# Patient Record
Sex: Male | Born: 1960 | Hispanic: No | Marital: Married | Smoking: Never smoker
Health system: Southern US, Community
[De-identification: ages and names within clinical notes are randomized; demographics above are authoritative.]

## PROBLEM LIST (undated history)

## (undated) DIAGNOSIS — I609 Nontraumatic subarachnoid hemorrhage, unspecified: Secondary | ICD-10-CM

## (undated) DIAGNOSIS — I639 Cerebral infarction, unspecified: Secondary | ICD-10-CM

## (undated) DIAGNOSIS — E119 Type 2 diabetes mellitus without complications: Secondary | ICD-10-CM

## (undated) DIAGNOSIS — J84112 Idiopathic pulmonary fibrosis: Secondary | ICD-10-CM

## (undated) DIAGNOSIS — J45909 Unspecified asthma, uncomplicated: Secondary | ICD-10-CM

## (undated) DIAGNOSIS — R519 Headache, unspecified: Secondary | ICD-10-CM

## (undated) DIAGNOSIS — R51 Headache: Secondary | ICD-10-CM

## (undated) HISTORY — PX: ANEURYSM COILING: SHX5349

## (undated) HISTORY — DX: Unspecified asthma, uncomplicated: J45.909

---

## 2012-10-08 ENCOUNTER — Emergency Department (HOSPITAL_COMMUNITY): Payer: Medicaid Other

## 2012-10-08 ENCOUNTER — Inpatient Hospital Stay (HOSPITAL_COMMUNITY)
Admission: EM | Admit: 2012-10-08 | Discharge: 2012-10-27 | DRG: 020 | Disposition: A | Discharge status: Rehabilitation Facility | Payer: Medicaid Other | Attending: Neurosurgery | Admitting: Neurosurgery

## 2012-10-08 ENCOUNTER — Encounter (HOSPITAL_COMMUNITY): Payer: Self-pay

## 2012-10-08 DIAGNOSIS — J9601 Acute respiratory failure with hypoxia: Secondary | ICD-10-CM

## 2012-10-08 DIAGNOSIS — J9611 Chronic respiratory failure with hypoxia: Secondary | ICD-10-CM

## 2012-10-08 DIAGNOSIS — K560 Paralytic ileus: Secondary | ICD-10-CM | POA: Diagnosis not present

## 2012-10-08 DIAGNOSIS — G911 Obstructive hydrocephalus: Secondary | ICD-10-CM | POA: Diagnosis present

## 2012-10-08 DIAGNOSIS — R4182 Altered mental status, unspecified: Secondary | ICD-10-CM | POA: Diagnosis present

## 2012-10-08 DIAGNOSIS — J9610 Chronic respiratory failure, unspecified whether with hypoxia or hypercapnia: Secondary | ICD-10-CM

## 2012-10-08 DIAGNOSIS — R7309 Other abnormal glucose: Secondary | ICD-10-CM | POA: Diagnosis not present

## 2012-10-08 DIAGNOSIS — I82619 Acute embolism and thrombosis of superficial veins of unspecified upper extremity: Secondary | ICD-10-CM | POA: Diagnosis not present

## 2012-10-08 DIAGNOSIS — R509 Fever, unspecified: Secondary | ICD-10-CM

## 2012-10-08 DIAGNOSIS — E871 Hypo-osmolality and hyponatremia: Secondary | ICD-10-CM | POA: Diagnosis not present

## 2012-10-08 DIAGNOSIS — I671 Cerebral aneurysm, nonruptured: Secondary | ICD-10-CM | POA: Diagnosis present

## 2012-10-08 DIAGNOSIS — E876 Hypokalemia: Secondary | ICD-10-CM | POA: Diagnosis not present

## 2012-10-08 DIAGNOSIS — J9600 Acute respiratory failure, unspecified whether with hypoxia or hypercapnia: Secondary | ICD-10-CM | POA: Diagnosis not present

## 2012-10-08 DIAGNOSIS — D696 Thrombocytopenia, unspecified: Secondary | ICD-10-CM | POA: Diagnosis not present

## 2012-10-08 DIAGNOSIS — I609 Nontraumatic subarachnoid hemorrhage, unspecified: Secondary | ICD-10-CM | POA: Diagnosis present

## 2012-10-08 HISTORY — DX: Headache, unspecified: R51.9

## 2012-10-08 HISTORY — DX: Headache: R51

## 2012-10-08 LAB — COMPREHENSIVE METABOLIC PANEL
ALT: 19 U/L (ref 0–53)
AST: 22 U/L (ref 0–37)
Albumin: 4 g/dL (ref 3.5–5.2)
Alkaline Phosphatase: 91 U/L (ref 39–117)
Calcium: 9.4 mg/dL (ref 8.4–10.5)
Glucose, Bld: 129 mg/dL — ABNORMAL HIGH (ref 70–99)
Potassium: 3.8 mEq/L (ref 3.5–5.1)
Sodium: 142 mEq/L (ref 135–145)
Total Protein: 8.1 g/dL (ref 6.0–8.3)

## 2012-10-08 LAB — PROTIME-INR
INR: 1.16 (ref 0.00–1.49)
Prothrombin Time: 14.6 seconds (ref 11.6–15.2)

## 2012-10-08 LAB — POCT I-STAT TROPONIN I

## 2012-10-08 LAB — POCT I-STAT, CHEM 8
BUN: 16 mg/dL (ref 6–23)
Creatinine, Ser: 1 mg/dL (ref 0.50–1.35)
Glucose, Bld: 125 mg/dL — ABNORMAL HIGH (ref 70–99)
Hemoglobin: 15.3 g/dL (ref 13.0–17.0)
Potassium: 3.8 mEq/L (ref 3.5–5.1)
Sodium: 144 mEq/L (ref 135–145)
TCO2: 23 mmol/L (ref 0–100)

## 2012-10-08 LAB — DIFFERENTIAL
Basophils Absolute: 0 10*3/uL (ref 0.0–0.1)
Eosinophils Absolute: 0 10*3/uL (ref 0.0–0.7)
Eosinophils Relative: 0 % (ref 0–5)
Lymphocytes Relative: 17 % (ref 12–46)
Lymphs Abs: 1.7 10*3/uL (ref 0.7–4.0)
Neutrophils Relative %: 78 % — ABNORMAL HIGH (ref 43–77)

## 2012-10-08 LAB — CBC
MCH: 34.9 pg — ABNORMAL HIGH (ref 26.0–34.0)
MCV: 97 fL (ref 78.0–100.0)
Platelets: 152 10*3/uL (ref 150–400)
RBC: 4.39 MIL/uL (ref 4.22–5.81)
RDW: 13.1 % (ref 11.5–15.5)
WBC: 9.8 10*3/uL (ref 4.0–10.5)

## 2012-10-08 LAB — ETHANOL: Alcohol, Ethyl (B): <11 mg/dL (ref 0–11)

## 2012-10-08 MED ORDER — NALOXONE HCL 1 MG/ML IJ SOLN
1.0000 mg | Freq: Once | INTRAMUSCULAR | Status: AC
  Administered 2012-10-08: 1 mg via INTRAVENOUS

## 2012-10-08 MED ORDER — NALOXONE HCL 1 MG/ML IJ SOLN
INTRAMUSCULAR | Status: AC
  Filled 2012-10-08: qty 2

## 2012-10-08 MED ORDER — SODIUM CHLORIDE 0.9 % IV BOLUS (SEPSIS)
1000.0000 mL | Freq: Once | INTRAVENOUS | Status: AC
  Administered 2012-10-08: 1000 mL via INTRAVENOUS

## 2012-10-08 NOTE — Code Documentation (Addendum)
Patient complained of headache throughout day, went to urgent care earlier in the day and received medication for the headache and was sent home. Around 1630 patient's mental status changed and became increasingly confused. Patient's wife called EMS. Code stroke called at 2108 after arrival to [facility] ED. Patient arrived via [facility] at 2053, LKW, 1630, EDP exam at 2102, stroke team arrived at 2120, neurologist arrived at 2120, patient arrived in CT at 2110, phlebotomist arrived at 2106, CT read by Dr. Amada Jupiter at 2125. Initial NIH 4, will continue to monitor.

## 2012-10-08 NOTE — ED Notes (Addendum)
PER EMS: pt from home, wife took him to urgent care earlier today around 1030 due to HA and vomiting. Was given a shot of something, and went home. Pts wife called EMS due to pts altered mental status. Upon ems arrival pt was projectile vomiting and systolic BP was 240s and not following commands.was given a bowl to vomit in and pt put it on his head. Given fluid per EMS and pts BP decreased to 140s. Pt still disoriented and not following commands, warm to the touch. Pt family denies any recent travel out of the country. Last known well was at 1630 today.

## 2012-10-08 NOTE — H&P (Signed)
Richard Davenport is an 52 y.o. male.   Chief Complaint: Subarachnoid hemorrhage HPI: The patient is a 52 year old gentleman who was in his usual state of health until this morning when he developed fairly acute onset of headache while he was eating his breakfast. He went to urgent care where he was evaluated and given some medications. He went home and went to the bathroom and then became unresponsive. [facility] where CT scan of the brain was obtained which showed diffuse subarachnoid hemorrhage with some intraventricular extension. Neurosurgical consultation was requested at that time. Situation was reviewed and was elected to admit the patient this time to the intensive care unit and obtain a cerebral arteriogram in the morning for treatment plans at that time.  Past Medical History  Diagnosis Date  . Headache     No past surgical history on file.  No family history on file. Social History:  has no tobacco, alcohol, and drug history on file.  Allergies: No Known Allergies   (Not in a hospital admission)  Results for orders placed during the hospital encounter of 10/08/12 (from the past 48 hour(s))  GLUCOSE, CAPILLARY     Status: Abnormal   Collection Time    10/08/12  9:12 PM      Result Value Range   Glucose-Capillary 109 (*) 70 - 99 mg/dL  ETHANOL     Status: None   Collection Time    10/08/12  9:31 PM      Result Value Range   Alcohol, Ethyl (B) <11  0 - 11 mg/dL   Comment:            LOWEST DETECTABLE LIMIT FOR     SERUM ALCOHOL IS 11 mg/dL     FOR MEDICAL PURPOSES ONLY  PROTIME-INR     Status: None   Collection Time    10/08/12  9:31 PM      Result Value Range   Prothrombin Time 14.6  11.6 - 15.2 seconds   INR 1.16  0.00 - 1.49  APTT     Status: None   Collection Time    10/08/12  9:31 PM      Result Value Range   aPTT 28  24 - 37 seconds  CBC     Status: Abnormal   Collection Time    10/08/12  9:31 PM      Result Value Range   WBC 9.8   4.0 - 10.5 K/uL   RBC 4.39  4.22 - 5.81 MIL/uL   Hemoglobin 15.3  13.0 - 17.0 g/dL   HCT 16.1  09.6 - 04.5 %   MCV 97.0  78.0 - 100.0 fL   MCH 34.9 (*) 26.0 - 34.0 pg   MCHC 35.9  30.0 - 36.0 g/dL   RDW 40.9  81.1 - 91.4 %   Platelets 152  150 - 400 K/uL  DIFFERENTIAL     Status: Abnormal   Collection Time    10/08/12  9:31 PM      Result Value Range   Neutrophils Relative % 78 (*) 43 - 77 %   Neutro Abs 7.6  1.7 - 7.7 K/uL   Lymphocytes Relative 17  12 - 46 %   Lymphs Abs 1.7  0.7 - 4.0 K/uL   Monocytes Relative 5  3 - 12 %   Monocytes Absolute 0.4  0.1 - 1.0 K/uL   Eosinophils Relative 0  0 - 5 %   Eosinophils Absolute  0.0  0.0 - 0.7 K/uL   Basophils Relative 0  0 - 1 %   Basophils Absolute 0.0  0.0 - 0.1 K/uL  COMPREHENSIVE METABOLIC PANEL     Status: Abnormal   Collection Time    10/08/12  9:31 PM      Result Value Range   Sodium 142  135 - 145 mEq/L   Potassium 3.8  3.5 - 5.1 mEq/L   Chloride 104  96 - 112 mEq/L   CO2 26  19 - 32 mEq/L   Glucose, Bld 129 (*) 70 - 99 mg/dL   BUN 16  6 - 23 mg/dL   Creatinine, Ser 1.61  0.50 - 1.35 mg/dL   Calcium 9.4  8.4 - 09.6 mg/dL   Total Protein 8.1  6.0 - 8.3 g/dL   Albumin 4.0  3.5 - 5.2 g/dL   AST 22  0 - 37 U/L   ALT 19  0 - 53 U/L   Alkaline Phosphatase 91  39 - 117 U/L   Total Bilirubin 0.5  0.3 - 1.2 mg/dL   GFR calc non Af Amer 82 (*) >90 mL/min   GFR calc Af Amer >90  >90 mL/min   Comment: (NOTE)     The eGFR has been calculated using the CKD EPI equation.     This calculation has not been validated in all clinical situations.     eGFR's persistently <90 mL/min signify possible Chronic Kidney     Disease.  TROPONIN I     Status: None   Collection Time    10/08/12  9:31 PM      Result Value Range   Troponin I <0.30  <0.30 ng/mL   Comment:            Due to the release kinetics of cTnI,     a negative result within the first hours     of the onset of symptoms does not rule out     myocardial infarction with  certainty.     If myocardial infarction is still suspected,     repeat the test at appropriate intervals.  POCT I-STAT TROPONIN I     Status: None   Collection Time    10/08/12  9:42 PM      Result Value Range   Troponin i, poc 0.01  0.00 - 0.08 ng/mL   Comment 3            Comment: Due to the release kinetics of cTnI,     a negative result within the first hours     of the onset of symptoms does not rule out     myocardial infarction with certainty.     If myocardial infarction is still suspected,     repeat the test at appropriate intervals.  POCT I-STAT, CHEM 8     Status: Abnormal   Collection Time    10/08/12  9:44 PM      Result Value Range   Sodium 144  135 - 145 mEq/L   Potassium 3.8  3.5 - 5.1 mEq/L   Chloride 107  96 - 112 mEq/L   BUN 16  6 - 23 mg/dL   Creatinine, Ser 0.45  0.50 - 1.35 mg/dL   Glucose, Bld 409 (*) 70 - 99 mg/dL   Calcium, Ion 8.11  1.12 - 1.23 mmol/L   TCO2 23  0 - 100 mmol/L   Hemoglobin 15.3  13.0 - 17.0 g/dL   HCT 45.0  39.0 - 52.0 %   Ct Head Wo Contrast  10/08/2012   *RADIOLOGY REPORT*  Clinical Data: Altered mental status, vomiting.  CT HEAD WITHOUT CONTRAST  Technique:  Contiguous axial images were obtained from the base of the skull through the vertex without contrast.  Comparison: None.  Findings: Bony calvarium appears to be intact.  Frontal, bilateral ethmoid and right maxillary sinusitis is noted.  Intracranially, subarachnoid hemorrhage is noted in the basal cisterns as well as the sylvian fissure appears bilaterally.  Intraventricular hemorrhage is noted in the fourth ventricle, posterior horns bilaterally of the third ventricles and possibly dependent portion of third ventricle.  There may be minimal ventricular dilatation currently.  No midline shift is noted.  IMPRESSION: Findings are consistent with extensive sinusitis as described above. Subarachnoid hemorrhage is noted in the basal cisterns and bilateral sylvian fissures.  There also  appears to be some degree of intraventricular hemorrhage present as described above, with associated minimal to mild degree of ventricular dilatation present as well. These findings are concerning for possible intracranial aneurysm.  Critical Value/emergent results were called by telephone at the time of interpretation on October 08, 2012 at 08:30 p.m. to [facility] and Dr. Amada Jupiter, who verbally acknowledged these results.   Original Report Authenticated By: Lupita Raider.,  M.D.    A comprehensive review of systems was negative.  Blood pressure 125/66, pulse 69, temperature 100.3 F (37.9 C), temperature source Rectal, resp. rate 18, SpO2 100.00%.  The patient is lethargic but aroused is and responds appropriately. He follows complex commands bilaterally with good strength. He verbalizes a bit. His eyes are open to name his pupils are reactive. Assessment/Plan CT scan of the brain is reviewed which is a very diffuse subarachnoid hemorrhage which makes localizing the likely source difficult based on CT scan. We're going to get a CT arteriogram of the brain tonight. Hours cerebrovascular specialist Dr. Conchita Paris will see the patient in the morning and assume his care. The patient will have a 4 vessel cerebral arteriogram at that time. Mr. Romeo Apple has a small amount of intraventricular hemorrhage and mild hydrocephalus I do not think that he needs any sort of ventriculostomy at this time. I discussed the situation with the family and the skin the plan and agree with it.  Reinaldo Meeker, MD 10/08/2012, 11:08 PM

## 2012-10-08 NOTE — ED Provider Notes (Signed)
TIME SEEN: 9:02 PM  CHIEF COMPLAINT: Altered mental status  HPI: Patient is a 52 year old male with no significant past medical history who presents the emergency department with altered mental status. Per patient's wife, he has had a headache that started this morning. They were seen in urgent care if she states she was given an injection of pain medication and sent home with Vicodin. She states that approximately 4:30 PM he laid down to take a nap and was acting normally. She went to the pharmacy to pick up medications. When she came back at 6 PM, the patient was confused, difficult to arouse, not answering questions or following commands. She denies that he has any known head injury. He has not had any fever that she is aware of. No sick contacts. No cough, diarrhea. He has had vomiting with this headache. She states that he has had intermittent headaches in the past similar to this one with vomiting. Denies any complaints of numbness, tingling or focal weakness.  ROS: Unobtainable secondary to patient's altered mental status  PAST MEDICAL HISTORY/PAST SURGICAL HISTORY:  No past medical history on file.  MEDICATIONS:  Prior to Admission medications   Not on File    ALLERGIES:  Allergies not on file  SOCIAL HISTORY:  History  Substance Use Topics  . Smoking status: Not on file  . Smokeless tobacco: Not on file  . Alcohol Use: Not on file    FAMILY HISTORY: No family history on file.  EXAM: BP 110/75  Pulse 76  Temp(Src) 100.3 F (37.9 C) (Rectal)  Resp 20  SpO2 94% CONSTITUTIONAL: Patient is difficult to arouse, moves all 4 extremities spontaneously but does not open eyes or answer questions; GCS 8 HEAD: Normocephalic EYES: Conjunctivae clear, PERRL, no pinpoint pupils ENT: normal nose; no rhinorrhea; moist mucous membranes; pharynx without lesions noted NECK: Supple, no meningismus, no LAD  CARD: RRR; S1 and S2 appreciated; no murmurs, no clicks, no rubs, no  gallops RESP: Normal chest excursion without splinting or tachypnea; breath sounds clear and equal bilaterally; no wheezes, no rhonchi, no rales,  ABD/GI: Normal bowel sounds; non-distended; soft, non-tender, no rebound, no guarding BACK:  The back appears normal and is non-tender to palpation, there is no CVA tenderness EXT: Normal ROM in all joints; non-tender to palpation; no edema; normal capillary refill; no cyanosis    SKIN: Normal color for age and race; warm NEURO: Moves all extremities equally and spontaneously, localizes to painful stimuli, does not answer questions or follow commands, does not open eyes PSYCH: The patient's mood and manner are appropriate. Grooming and personal hygiene are appropriate.  MEDICAL DECISION MAKING: Patient with altered mental status. Last seen normal at 4:30 PM by wife. Code stroke called. Will obtain labs, urine, head CT. Will give Narcan and reassess. Patient is currently protecting his airway but may need intubation given a GCS of 8. Will allow neurology to see the patient first.  ED PROGRESS: Patient has large subarachnoid hemorrhage with intraventricular spread. We'll discuss with neurosurgery. Given patient's GCS of 8, patient will need to be intubated. I have discussed with family who agrees with plan.   9:48 PM  Patient is now opening his eyes to painful stimuli. His GCS is 9.  Will hold on intubation.  Spoke with Dr. Gerlene Fee with NSG who will come to see the patient in the emergency department and agrees with holding on intubation at this time. Patient and oxygen saturation is 100% on room air. He is protecting  his airway.   Date: 10/08/2012 20:54  Rate: 68  Rhythm: normal sinus rhythm  QRS Axis: normal  Intervals: normal  ST/T Wave abnormalities: normal  Conduction Disutrbances: none  Narrative Interpretation: unremarkable; no ischemic changes  11:11 PM  Pt appears much more alert and now follows commands and verbalizes.  NSG has seen.  Will  get CT angio brain tonight per NSG recommendations.  Pt to be seen by cerebrovascular specialist in AM.    CRITICAL CARE Performed by: Raelyn Number   Total critical care time: 30 minutes  Critical care time was exclusive of separately billable procedures and treating other patients.  Critical care was necessary to treat or prevent imminent or life-threatening deterioration.  Critical care was time spent personally by me on the following activities: development of treatment plan with patient and/or surrogate as well as nursing, discussions with consultants, evaluation of patient's response to treatment, examination of patient, obtaining history from patient or surrogate, ordering and performing treatments and interventions, ordering and review of laboratory studies, ordering and review of radiographic studies, pulse oximetry and re-evaluation of patient's condition.     Layla Maw Jenisis Harmsen, DO 10/08/12 [id number]

## 2012-10-08 NOTE — ED Notes (Signed)
Pt at CT Scan 

## 2012-10-08 NOTE — ED Notes (Signed)
Code Stroke activated @ 21:09

## 2012-10-08 NOTE — ED Notes (Signed)
Per Dr Elesa Massed pt will not be intubated at this time per Neurology. Pt is maintaining his own airway. GCS of 9. Pt remains on monitor. Family is at bedside.

## 2012-10-09 ENCOUNTER — Inpatient Hospital Stay (HOSPITAL_COMMUNITY): Payer: Medicaid Other

## 2012-10-09 ENCOUNTER — Encounter (HOSPITAL_COMMUNITY): Payer: Self-pay | Admitting: Certified Registered"

## 2012-10-09 ENCOUNTER — Encounter (HOSPITAL_COMMUNITY)
Admission: EM | Disposition: A | Discharge status: Rehabilitation Facility | Payer: Medicaid Other | Source: Home / Self Care | Attending: Neurosurgery

## 2012-10-09 ENCOUNTER — Inpatient Hospital Stay (HOSPITAL_COMMUNITY): Payer: Medicaid Other | Admitting: Certified Registered"

## 2012-10-09 ENCOUNTER — Encounter (HOSPITAL_COMMUNITY): Payer: Self-pay

## 2012-10-09 DIAGNOSIS — R4182 Altered mental status, unspecified: Secondary | ICD-10-CM

## 2012-10-09 DIAGNOSIS — I609 Nontraumatic subarachnoid hemorrhage, unspecified: Secondary | ICD-10-CM | POA: Diagnosis present

## 2012-10-09 HISTORY — DX: Altered mental status, unspecified: R41.82

## 2012-10-09 HISTORY — PX: RADIOLOGY WITH ANESTHESIA: SHX6223

## 2012-10-09 LAB — ABO/RH: ABO/RH(D): O POS

## 2012-10-09 LAB — POCT I-STAT 3, ART BLOOD GAS (G3+)
Bicarbonate: 22.1 mEq/L (ref 20.0–24.0)
Bicarbonate: 22.6 mEq/L (ref 20.0–24.0)
O2 Saturation: 99 %
O2 Saturation: 98 %
TCO2: 23 mmol/L (ref 0–100)
TCO2: 24 mmol/L (ref 0–100)
pCO2 arterial: 39.8 mmHg (ref 35.0–45.0)
pCO2 arterial: 37.6 mmHg (ref 35.0–45.0)
pH, Arterial: 7.353 (ref 7.350–7.450)
pO2, Arterial: 163 mmHg — ABNORMAL HIGH (ref 80.0–100.0)
pO2, Arterial: 98 mmHg (ref 80.0–100.0)

## 2012-10-09 LAB — PREPARE RBC (CROSSMATCH)

## 2012-10-09 LAB — MRSA PCR SCREENING: MRSA by PCR: NEGATIVE

## 2012-10-09 SURGERY — RADIOLOGY WITH ANESTHESIA
Anesthesia: General

## 2012-10-09 MED ORDER — ROCURONIUM BROMIDE 100 MG/10ML IV SOLN
INTRAVENOUS | Status: DC | PRN
  Administered 2012-10-09: 40 mg via INTRAVENOUS
  Administered 2012-10-09: 50 mg via INTRAVENOUS
  Administered 2012-10-09: 10 mg via INTRAVENOUS

## 2012-10-09 MED ORDER — LABETALOL HCL 5 MG/ML IV SOLN
10.0000 mg | INTRAVENOUS | Status: DC | PRN
  Administered 2012-10-15: 10 mg via INTRAVENOUS
  Administered 2012-10-16 (×2): 15 mg via INTRAVENOUS
  Administered 2012-10-16: 10 mg via INTRAVENOUS
  Administered 2012-10-16: 20 mg via INTRAVENOUS
  Filled 2012-10-09 (×5): qty 4

## 2012-10-09 MED ORDER — ACETAMINOPHEN 325 MG PO TABS
650.0000 mg | ORAL_TABLET | ORAL | Status: DC | PRN
  Administered 2012-10-10 – 2012-10-27 (×22): 650 mg via ORAL
  Filled 2012-10-09 (×22): qty 2

## 2012-10-09 MED ORDER — IOHEXOL 350 MG/ML SOLN
50.0000 mL | Freq: Once | INTRAVENOUS | Status: AC | PRN
  Administered 2012-10-09: 50 mL via INTRAVENOUS

## 2012-10-09 MED ORDER — BIOTENE DRY MOUTH MT LIQD
15.0000 mL | Freq: Four times a day (QID) | OROMUCOSAL | Status: DC
  Administered 2012-10-09 – 2012-10-12 (×10): 15 mL via OROMUCOSAL

## 2012-10-09 MED ORDER — PHENYLEPHRINE HCL 10 MG/ML IJ SOLN
10.0000 mg | INTRAVENOUS | Status: DC | PRN
  Administered 2012-10-09: 20 ug/min via INTRAVENOUS

## 2012-10-09 MED ORDER — PHENYLEPHRINE HCL 10 MG/ML IJ SOLN
INTRAMUSCULAR | Status: DC | PRN
  Administered 2012-10-09 (×2): 80 ug via INTRAVENOUS

## 2012-10-09 MED ORDER — LIDOCAINE HCL (CARDIAC) 20 MG/ML IV SOLN
INTRAVENOUS | Status: DC | PRN
  Administered 2012-10-09: 100 mg via INTRAVENOUS

## 2012-10-09 MED ORDER — SENNOSIDES-DOCUSATE SODIUM 8.6-50 MG PO TABS
1.0000 | ORAL_TABLET | Freq: Two times a day (BID) | ORAL | Status: DC
  Administered 2012-10-11 – 2012-10-27 (×23): 1 via ORAL
  Filled 2012-10-09 (×37): qty 1

## 2012-10-09 MED ORDER — PROPOFOL 10 MG/ML IV BOLUS
INTRAVENOUS | Status: DC | PRN
  Administered 2012-10-09: 200 mg via INTRAVENOUS

## 2012-10-09 MED ORDER — ACETAMINOPHEN 650 MG RE SUPP
650.0000 mg | RECTAL | Status: DC | PRN
  Administered 2012-10-10 – 2012-10-17 (×3): 650 mg via RECTAL
  Filled 2012-10-09 (×3): qty 1

## 2012-10-09 MED ORDER — CEFAZOLIN SODIUM-DEXTROSE 2-3 GM-% IV SOLR
2.0000 g | Freq: Once | INTRAVENOUS | Status: AC
  Administered 2012-10-09: 2 g via INTRAVENOUS

## 2012-10-09 MED ORDER — VECURONIUM BROMIDE 10 MG IV SOLR
INTRAVENOUS | Status: DC | PRN
  Administered 2012-10-09: 4 mg via INTRAVENOUS
  Administered 2012-10-09: 3 mg via INTRAVENOUS

## 2012-10-09 MED ORDER — SODIUM CHLORIDE 0.9 % IV SOLN
INTRAVENOUS | Status: DC
  Administered 2012-10-09 (×2): via INTRAVENOUS
  Administered 2012-10-09: 1000 mL via INTRAVENOUS
  Administered 2012-10-09: 11:00:00 via INTRAVENOUS

## 2012-10-09 MED ORDER — PROPOFOL 10 MG/ML IV EMUL
5.0000 ug/kg/min | INTRAVENOUS | Status: DC
  Administered 2012-10-09: 40 ug/kg/min via INTRAVENOUS
  Administered 2012-10-09: 30 ug/kg/min via INTRAVENOUS
  Administered 2012-10-10: 40 ug/kg/min via INTRAVENOUS
  Administered 2012-10-10: 30 ug/kg/min via INTRAVENOUS
  Filled 2012-10-09 (×3): qty 100

## 2012-10-09 MED ORDER — HYDROMORPHONE HCL PF 1 MG/ML IJ SOLN
0.5000 mg | INTRAMUSCULAR | Status: DC | PRN
  Administered 2012-10-09 – 2012-10-27 (×30): 0.5 mg via INTRAVENOUS
  Filled 2012-10-09 (×33): qty 1

## 2012-10-09 MED ORDER — PANTOPRAZOLE SODIUM 40 MG IV SOLR
40.0000 mg | Freq: Every day | INTRAVENOUS | Status: DC
  Administered 2012-10-09 – 2012-10-10 (×2): 40 mg via INTRAVENOUS
  Filled 2012-10-09 (×3): qty 40

## 2012-10-09 MED ORDER — IOHEXOL 300 MG/ML  SOLN
150.0000 mL | Freq: Once | INTRAMUSCULAR | Status: AC | PRN
  Administered 2012-10-09: 80 mL via INTRAVENOUS

## 2012-10-09 MED ORDER — FENTANYL CITRATE 0.05 MG/ML IJ SOLN
INTRAMUSCULAR | Status: DC | PRN
  Administered 2012-10-09: 50 ug via INTRAVENOUS
  Administered 2012-10-09: 200 ug via INTRAVENOUS

## 2012-10-09 MED ORDER — VERAPAMIL HCL 2.5 MG/ML IV SOLN
INTRAVENOUS | Status: AC
  Filled 2012-10-09: qty 4

## 2012-10-09 MED ORDER — ARTIFICIAL TEARS OP OINT
TOPICAL_OINTMENT | OPHTHALMIC | Status: DC | PRN
  Administered 2012-10-09: 1 via OPHTHALMIC

## 2012-10-09 MED ORDER — SODIUM CHLORIDE 0.9 % IV SOLN
INTRAVENOUS | Status: DC
  Administered 2012-10-10 – 2012-10-19 (×14): via INTRAVENOUS
  Administered 2012-10-21: 125 mL/h via INTRAVENOUS
  Administered 2012-10-21 – 2012-10-24 (×7): via INTRAVENOUS
  Administered 2012-10-25: 125 mL via INTRAVENOUS
  Administered 2012-10-25 – 2012-10-27 (×4): via INTRAVENOUS

## 2012-10-09 MED ORDER — CHLORHEXIDINE GLUCONATE 0.12 % MT SOLN
15.0000 mL | Freq: Two times a day (BID) | OROMUCOSAL | Status: DC
  Administered 2012-10-09 – 2012-10-11 (×5): 15 mL via OROMUCOSAL
  Filled 2012-10-09 (×5): qty 15

## 2012-10-09 MED ORDER — SODIUM CHLORIDE 0.9 % IV SOLN
500.0000 mg | Freq: Two times a day (BID) | INTRAVENOUS | Status: DC
  Administered 2012-10-09 – 2012-10-11 (×7): 500 mg via INTRAVENOUS
  Filled 2012-10-09 (×9): qty 5

## 2012-10-09 NOTE — Preoperative (Signed)
Beta Blockers   Reason not to administer Beta Blockers:Not Applicable 

## 2012-10-09 NOTE — Anesthesia Postprocedure Evaluation (Signed)
  Anesthesia Post-op Note  Patient: Richard Davenport  Procedure(s) Performed: Procedure(s): RADIOLOGY WITH ANESTHESIA (N/A)  Patient Location: PACU and NICU  Anesthesia Type:General  Level of Consciousness: Patient remains intubated per anesthesia plan  Airway and Oxygen Therapy: Patient remains intubated per anesthesia plan  Post-op Pain: mild  Post-op Assessment: Post-op Vital signs reviewed  Post-op Vital Signs: Reviewed  Complications: No apparent anesthesia complications

## 2012-10-09 NOTE — Progress Notes (Signed)
Pt seen and examined. No issues overnight. Family at bedside.  EXAM: Temp:  [97.8 F (36.6 C)-100.3 F (37.9 C)] 98.9 F (37.2 C) (09/19 0800) Pulse Rate:  [62-91] 68 (09/19 1125) Resp:  [16-22] 19 (09/19 1125) BP: (78-131)/(53-80) 131/77 mmHg (09/19 1100) SpO2:  [94 %-100 %] 98 % (09/19 1125) FiO2 (%):  [21 %] 21 % (09/18 2109) Weight:  [88.4 kg (194 lb 14.2 oz)] 88.4 kg (194 lb 14.2 oz) (09/19 0100)  Intake/Output     09/18 0701 - 09/19 0700 09/19 0701 - 09/20 0700   I.V. (mL/kg) 460 (5.2) 300 (3.4)   IV Piggyback 105 105   Total Intake(mL/kg) 565 (6.4) 405 (4.6)   Urine (mL/kg/hr)  700 (1.1)   Total Output   700   Net +565 -295         Drowsy but easily arousable. Oriented to person, place, not year CN grossly intact Follows commands BUE/BLE  LABS: Lab Results  Component Value Date   CREATININE 1.00 10/08/2012   BUN 16 10/08/2012   NA 144 10/08/2012   K 3.8 10/08/2012   CL 107 10/08/2012   CO2 26 10/08/2012   Lab Results  Component Value Date   WBC 9.8 10/08/2012   HGB 15.3 10/08/2012   HCT 45.0 10/08/2012   MCV 97.0 10/08/2012   PLT 152 10/08/2012    IMAGING: CTH demonstrating diffuse basal SAH with some ventriculomegaly. CTA with ~43mm basilar apex aneurysm  IMPRESSION: - 52 y.o. male SAH day#1, Hunt Hess 3, Fisher 3  PLAN: - Diagnostic angiogram with possible coiling of aneurysm - Pt may require EVD prior to intervention  I spoke to the family at length RE: the diagnosis of SAH and the likely aneurysmal etiology. I reviewed the plan above including the risks of the diagnostic and coiling procedures including procedural hemorrhage, stroke, and other complications. The risks of the EVD procedure including hematoma and infection were also discussed. The family understood our discussion and provided informed consent for the above procedures. All questions were answered.

## 2012-10-09 NOTE — Progress Notes (Signed)
SLP Cancellation Note  Unable to complete SLP evaluation/treatment secondary to patient unavailable secondary to has been in surgery. Will attempt to see patient on next date to complete Bedside Swallow Evaluation.   Therapist: Angela Nevin, MA, CCC-SLP

## 2012-10-09 NOTE — Progress Notes (Signed)
eLink Physician-Brief Progress Note Patient Name: Richard Davenport DOB: 1961-01-11 MRN: [id number]  Date of Service  10/09/2012   HPI/Events of Note   Pt with SAH s/p coiling on vent  eICU Interventions  See vent orders.  Full PCCM note to follow   Intervention Category Major Interventions: Delirium, psychosis, severe agitation - evaluation and management;Respiratory failure - evaluation and management  Shan Levans 10/09/2012, 7:17 PM

## 2012-10-09 NOTE — Progress Notes (Signed)
ABG I-stat results are not carrying over into EPIC. PH- 7.35 pCO2- 39.8 pO2- 163 HCO3- 22.1

## 2012-10-09 NOTE — Transfer of Care (Signed)
Immediate Anesthesia Transfer of Care Note  Patient: Richard Davenport  Procedure(s) Performed: Procedure(s): RADIOLOGY WITH ANESTHESIA (N/A)  Patient Location: ICU  Anesthesia Type:General  Level of Consciousness: sedated and Patient remains intubated per anesthesia plan  Airway & Oxygen Therapy: Patient remains intubated per anesthesia plan and Patient placed on Ventilator (see vital sign flow sheet for setting)  Post-op Assessment: Report given to PACU RN and Post -op Vital signs reviewed and stable  Post vital signs: Reviewed and stable  Complications: No apparent anesthesia complications

## 2012-10-09 NOTE — Consult Note (Addendum)
PULMONARY  / CRITICAL CARE MEDICINE  Name: Richard Davenport MRN: [id number] DOB: [birth year]    ADMISSION DATE:  10/08/2012 CONSULTATION DATE:  10/09/2012  REFERRING MD :  Dr. Conchita Davenport PRIMARY SERVICE: Neurosurgery  CHIEF COMPLAINT:  SAH  BRIEF PATIENT DESCRIPTION: 52 M no PMH who presented with SAH on 9/18. Taken to OR 9/19 for angiogram with coiling and EVD placement left intubated post-procedure. CCM consulted for vent management.   SIGNIFICANT EVENTS / STUDIES:  1. CT 9/18 [facility] with ventriculomegaly 2. Angiogram/Basilar Artery Coiling 9/19  LINES / TUBES: 1. ETT 9/19  CULTURES: 1. None  ANTIBIOTICS: 1. None  HISTORY OF PRESENT ILLNESS:  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 Pulmonary critical care medicine has been consulted for ventilator management. Currently, the patient is intubated and sedated and unable to provide any history. His daughter is at the bedside who confirms the history obtained by the emergency room and neurosurgeons at the patient was complaining of headache yesterday and then became unresponsive.  PAST MEDICAL HISTORY :  Past Medical History  Diagnosis Date  . Headache     History reviewed. No pertinent past surgical history.  Prior to Admission medications   Medication Sig Start Date End Date Taking? Authorizing Provider  HYDROcodone-acetaminophen (NORCO/VICODIN) 5-325 MG per tablet Take 1 tablet by mouth every 6 (six) hours as needed for pain.   Yes Historical Provider, MD    No Known Allergies  FAMILY HISTORY:  History reviewed. No pertinent family history.  SOCIAL HISTORY:  reports that he has never smoked. He has never used smokeless tobacco. He reports that he does not drink alcohol or use illicit drugs.  REVIEW OF SYSTEMS:  Unable to obtain secondary to patient  condition.   PHYSICAL EXAM  VITAL SIGNS: Temp:  [96.5 F (35.8 C)-100.3 F (37.9 C)] 96.5 F (35.8 C) (09/19 2018) Pulse Rate:  [60-91] 69 (09/19 2018) Resp:  [14-22] 15 (09/19 2018) BP: (78-131)/(53-80) 112/74 mmHg (09/19 2018) SpO2:  [94 %-100 %] 100 % (09/19 2018) FiO2 (%):  [21 %-40 %] 30 % (09/19 1940) Weight:  [194 lb 14.2 oz (88.4 kg)] 194 lb 14.2 oz (88.4 kg) (09/19 0100)  HEMODYNAMICS:    VENTILATOR SETTINGS: Vent Mode:  [-] PRVC FiO2 (%):  [21 %-40 %] 30 % Set Rate:  [14 bmp] 14 bmp Vt Set:  [550 mL] 550 mL PEEP:  [5 cmH20] 5 cmH20 Plateau Pressure:  [14 cmH20-18 cmH20] 14 cmH20  INTAKE / OUTPUT: Intake/Output     09/19 0701 - 09/20 0700   I.V. (mL/kg) 1820.5 (20.6)   IV Piggyback 105   Total Intake(mL/kg) 1925.5 (21.8)   Urine (mL/kg/hr) 1400 (1.2)   Blood 50 (0)   Total Output 1450   Net +475.5         PHYSICAL EXAMINATION: General:  Middle-aged male in no acute distress Neuro:  Sedated, unable to evaluate HEENT:  Sclera anicteric, conjunctiva pink. Endotracheal tube present. Neck:  Trachea supple in midline. No lymphadenopathy or JVD Cardiovascular:  Regular rate rhythm, normal S1-S2, no murmurs rubs or gallops Lungs:  Clear auscultation bilaterally; mechanical breath sounds Abdomen:  Soft, nontender, nondistended, positive bowel sounds Musculoskeletal:  No clubbing cyanosis or edema Skin:  Intact, no rashes or skin break  LABS:   CBC Recent Labs     10/08/12  2131  10/08/12  2144  WBC  9.8   --   HGB  15.3  15.3  HCT  42.6  45.0  PLT  152   --     Coag's Recent Labs     10/08/12  2131  APTT  28  INR  1.16     BMET Recent Labs     10/08/12  2131  10/08/12  2144  NA  142  144  K  3.8  3.8  CL  104  107  CO2  26   --   BUN  16  16  CREATININE  1.04  1.00  GLUCOSE  129*  125*    Electrolytes Recent Labs     10/08/12  2131  CALCIUM  9.4    Sepsis Markers No results found for this basename: LACTICACIDVEN,  PROCALCITON, O2SATVEN,  in the last 72 hours  ABG Recent Labs     10/09/12  1935  PHART  7.353  PCO2ART  39.8  PO2ART  163.0*    Liver Enzymes Recent Labs     10/08/12  2131  AST  22  ALT  19  ALKPHOS  91  BILITOT  0.5  ALBUMIN  4.0    Cardiac Enzymes Recent Labs     10/08/12  2131  TROPONINI  <0.30    Glucose Recent Labs     10/08/12  2112  GLUCAP  109*    Imaging Ct Angio Head W/cm &/or Wo Cm  10/09/2012   CLINICAL DATA:  FOLLOW UP code stroke  EXAM: CT ANGIOGRAPHY HEAD  TECHNIQUE: Multidetector CT imaging of the head was performed using the standard protocol during bolus administration of intravenous contrast. Multiplanar CT image reconstructions including MIPs were obtained to evaluate the vascular anatomy.  CONTRAST:  50mL OMNIPAQUE IOHEXOL 350 MG/ML SOLN  COMPARISON:  Head CT from 1 day prior  FINDINGS: Non-CTA portion shows relatively stable volume of diffuse subarachnoid hemorrhage, most dense in the basal cisterns, with intraventricular extension into the 4th and lateral ventricles, layering in the occipital horns. There is mild hydrocephalus, unchanged. Small area of apparent cortical low-attenuation in the right occipital region is very discrete, and is favored a prominent sulcus on thin slice imaging.  Extensive inflammatory paranasal sinus disease. There is opacification of the right maxillary antrum (chronically with atelectasis and wall thickening and bowed at medial wall), ethmoid sinuses, and bilateral frontal sinuses. There is scattered polypoid mucosal thickening in the left nasal cavity. Numerous left ethmoid air cells are also opacified.  Posterior circulation: Mildly left dominant vertebrobasilar system. There is a superiorly directed aneurysm from the basilar tip which measures 5.5 mm base to dome and 5 mm in maximal diameter. The neck is relatively narrow at 1-2 mm. There is thrombus adherent to the sac, predominant to the left. No major vessel occlusion.   Anterior circulation: No evidence of aneurysm, significant stenosis, or occlusion. Standard anatomy. Small posterior communicating artery present on the left.  These results were called by telephone at the time of interpretation on 10/09/2012 at 12:45 AMto Dr Gerlene Fee, who verbally acknowledged these results.  Review of the MIP images confirms the above findings.  IMPRESSION: 1. 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 Left dominant vertebral artery. 2. No additional intracranial aneurysm identified. 3. Compared to head CT earlier today, unchanged volume of subarachnoid/intraventricular hemorrhage and hydrocephalus. 4. 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 There is chronic obstruction at the level of the  right middle meatus.   Electronically Signed   By: Tiburcio Pea   On: 10/09/2012 00:52   Ct Head Wo Contrast  10/09/2012   *RADIOLOGY REPORT*  Clinical Data: Subarachnoid and intraventricular hemorrhage  CT HEAD WITHOUT CONTRAST  Technique:  Contiguous axial images were obtained from the base of the skull through the vertex without contrast.  Comparison: Prior CTA from earlier on the same day as well as CT from 10/08/2012  Findings: Extensive subarachnoid hemorrhage within the basilar cisterns is not significantly changed as compared to the most recent examination. Small amount of subarachnoid hemorrhage is also seen layering within cortical sulci within the parietal lobes bilaterally.  Intraventricular hemorrhage within the fourth ventricle is not significantly changed. There is increased blood within the occipital horns of the lateral ventricles bilaterally, likely secondary to redistribution.  Overall, the lateral ventricles, third ventricle, and fourth ventricle remain prominent, unchanged as compared to prior examination.  There is no midline shift.  No transependymal flow of CSF is identified. There is no extra-axial fluid collection.  No new  hemorrhage is identified.  No acute infarct is identified.  Gray-white matter differentiation is maintained.  Extensive pansinusitis again noted.  IMPRESSION:  1.  No significant interval change in extensive subarachnoid hemorrhage within the basilar cisterns  2.  Similar intraventricular hemorrhage within the fourth ventricle with increased blood within the occipital horns of the lateral ventricles bilaterally, likely secondary to redistribution. Hydrocephalous is not significantly changed as compared to prior exam.   Original Report Authenticated By: Rise Mu, M.D.   Ct Head Wo Contrast  10/08/2012   *RADIOLOGY REPORT*  Clinical Data: Altered mental status, vomiting.  CT HEAD WITHOUT CONTRAST  Technique:  Contiguous axial images were obtained from the base of the skull through the vertex without contrast.  Comparison: None.  Findings: Bony calvarium appears to be intact.  Frontal, bilateral ethmoid and right maxillary sinusitis is noted.  Intracranially, subarachnoid hemorrhage is noted in the basal cisterns as well as the sylvian fissure appears bilaterally.  Intraventricular hemorrhage is noted in the fourth ventricle, posterior horns bilaterally of the third ventricles and possibly dependent portion of third ventricle.  There may be minimal ventricular dilatation currently.  No midline shift is noted.  IMPRESSION: Findings are consistent with extensive sinusitis as described above. Subarachnoid hemorrhage is noted in the basal cisterns and bilateral sylvian fissures.  There also appears to be some degree of intraventricular hemorrhage present as described above, with associated minimal to mild degree of ventricular dilatation present as well. These findings are concerning for possible intracranial aneurysm.  Critical Value/emergent results were called by telephone at the time of interpretation on October 08, 2012 at 08:30 p.m. to [facility] and Dr. Amada Jupiter, who verbally acknowledged these  results.   Original Report Authenticated By: Lupita Raider.,  M.D.   Portable Chest Xray  10/09/2012   CLINICAL DATA:  Hypoxia  EXAM: PORTABLE CHEST - 1 VIEW  COMPARISON:  None.  FINDINGS: Endotracheal tube tip is 2.1 cm above the carina. No pneumothorax. Lungs clear. Heart is upper normal in size with normal pulmonary vascularity. No adenopathy.  IMPRESSION: Endotracheal tube as described. No pneumothorax. No edema or consolidation.   Electronically Signed   By: Bretta Bang   On: 10/09/2012 20:05    EKG: ECG from today was personally reviewed by me. It is essentially normal. CXR: Chest x-ray from today was personally reviewed by me. There is slight rightward deviation of the endotracheal tube; it  is otherwise normal.  ASSESSMENT / PLAN: Principal Problem:   SAH (subarachnoid hemorrhage) Active Problems:   Altered mental status   PULMONARY A: 1. Respiratory failure: This is secondary to the patient's intracranial injury. There is no evidence of intrinsic dysfunction of the lungs.  P:    Lung protective ventilation  VAP prevention  Retract ETT  Decrease TV to 7 CC/kg  ABG in 1 hr after vent changes  Weaning when awake and alert    CARDIOVASCULAR A:  1. No Acute Issue:  RENAL A:  1. No Acute Issue:  GASTROINTESTINAL A: 1. No Acute Issue:  HEMATOLOGIC A:   1. No Acute Issue:  INFECTIOUS A: 1. No Acute Issue:  ENDOCRINE A:   1. Minimal Hyperglycemia: Likely underlying DM.  P:    Check A1c  q6 Accuchecks  NEUROLOGIC A:  1. SAH:   P:    Management Per NS  BEST PRACTICE / DISPOSITION Level of Care:  ICU Consultants:  CCM/NS comanagement Code Status:  Full Diet:  NPO DVT Px:  SCD, SQH when cleared by NS GI Px:  Protonix Skin Integrity:  Intact Social / Family:  At Bedside  TODAY'S SUMMARY:   I have personally obtained a history, examined the patient, evaluated laboratory and imaging results, formulated the assessment and plan and  placed orders.  Evalyn Casco, MD Pulmonary and Critical Care Medicine [facility] Pager: [phone]  10/09/2012, 8:22 PM

## 2012-10-09 NOTE — Progress Notes (Signed)
Pt. Experiencing change in neuro status. Pt. Unable to follow commands at this time. Dr. Gerlene Fee made aware. Order for STAT CT given. Will continue to monitor.

## 2012-10-09 NOTE — Brief Op Note (Signed)
10/08/2012 - 10/09/2012  7:02 PM  PATIENT:  Richard Davenport  52 y.o. male  PRE-OPERATIVE DIAGNOSIS: Subarachnoid hemorrhage  POST-OPERATIVE DIAGNOSIS:  Basilar aneurysm  PROCEDURE:  1. Diagnostic Angiogram      2. Coiling of basilar aneurysm      3. Placement of External ventricular drain  SURGEON:  Surgeon(s) and Role:    * Lisbeth Renshaw, MD - Primary  ASSISTANTS: none   ANESTHESIA:   general  EBL:  Minimal  BLOOD ADMINISTERED:none  DRAINS: External Ventricular Drain   LOCAL MEDICATIONS USED:  NONE  SPECIMEN:  No Specimen  DISPOSITION OF SPECIMEN:  N/A  DICTATION: .Other Dictation: Dictation Number See radiology report  PLAN OF CARE: ICU  PATIENT DISPOSITION:  ICU - intubated and hemodynamically stable.

## 2012-10-09 NOTE — Procedures (Signed)
PREOP DX: Hydrocephalus  POSTOP DX: Same  PROCEDURE: Right frontal ventriculostomy   SURGEON: Dr. Lisbeth Renshaw, MD  ANESTHESIA: IV Sedation (versed and fentanyl) with Local  EBL: Minimal  SPECIMENS: None  COMPLICATIONS: None  CONDITION: Hemodynamically stable  INDICATIONS: Mrs. Richard Davenport is a 52 y.o. male admitted with subarachnoid hemorrhage, [place] Hess 3. Prior to the interventional portion of the aneurysm coiling, EVD placement was indicated for ICP control and monitoring.  PROCEDURE IN DETAIL: The patient remained intubated and under general anesthesia on the angiography table after the diagnostic angiogram.  After consent was obtained from the patient's family, skin of the right frontal scalp was clipped, prepped and draped in the usual sterile fashion.  Scalp was then infiltrated with local anesthetic with epinephrine.  Skin incision was made sharply, and twist drill burr hole was made.  The dura was then incised, and the ventricular catheter was passed in one attempt into the right lateral ventricle.  Good CSF flow was obtained.  The catheter was then tunneled subcutaneously and connected to a drainage system and the skin incision closed.  The drain was then secured in place.  FINDINGS: 1. Opening pressure ~10cmH2O 2. Slightly blood tinged CSF

## 2012-10-09 NOTE — Anesthesia Preprocedure Evaluation (Addendum)
Anesthesia Evaluation  Patient identified by MRN, date of birth, ID band Patient awake    Reviewed: Allergy & Precautions, H&P , NPO status , Patient's Chart, lab work & pertinent test results  Airway Mallampati: II TM Distance: >3 FB Neck ROM: Limited    Dental   Pulmonary  breath sounds clear to auscultation        Cardiovascular Rhythm:Regular Rate:Normal     Neuro/Psych  Headaches, SAH, cerebral aneurysm    GI/Hepatic   Endo/Other    Renal/GU      Musculoskeletal   Abdominal   Peds  Hematology   Anesthesia Other Findings   Reproductive/Obstetrics                           Anesthesia Physical Anesthesia Plan  ASA: III  Anesthesia Plan: General   Post-op Pain Management:    Induction: Intravenous  Airway Management Planned: Oral ETT  Additional Equipment: Arterial line  Intra-op Plan:   Post-operative Plan: Post-operative intubation/ventilation  Informed Consent: I have reviewed the patients History and Physical, chart, labs and discussed the procedure including the risks, benefits and alternatives for the proposed anesthesia with the patient or authorized representative who has indicated his/her understanding and acceptance.     Plan Discussed with: CRNA and Surgeon  Anesthesia Plan Comments:         Anesthesia Quick Evaluation

## 2012-10-09 NOTE — Progress Notes (Signed)
Utilization review completed.  P.J. Plummer Matich,RN,BSN Case Manager [phone]  

## 2012-10-09 NOTE — Anesthesia Procedure Notes (Signed)
Procedure Name: Intubation Date/Time: 10/09/2012 2:00 PM Performed by: Gayla Medicus Pre-anesthesia Checklist: Patient identified, Patient being monitored, Emergency Drugs available, Timeout performed and Suction available Patient Re-evaluated:Patient Re-evaluated prior to inductionOxygen Delivery Method: Circle system utilized Preoxygenation: Pre-oxygenation with 100% oxygen Intubation Type: IV induction Ventilation: Mask ventilation without difficulty and Oral airway inserted - appropriate to patient size Laryngoscope Size: Mac and 4 Grade View: Grade II Tube type: Subglottic suction tube Tube size: 7.5 mm Number of attempts: 1 Airway Equipment and Method: Stylet Placement Confirmation: ETT inserted through vocal cords under direct vision,  positive ETCO2 and breath sounds checked- equal and bilateral Secured at: 23 cm Tube secured with: Tape Dental Injury: Teeth and Oropharynx as per pre-operative assessment

## 2012-10-09 NOTE — Progress Notes (Signed)
Noted that pt had not voided since admission 9/19 early am; pt lethargic but responsive to verbal stimuli.  Bladder scan = 790 cc.  Dr. Conchita Paris notified, order received to place Foley catheter.  When RN returned to room pt had climbed over siderails, stated he needed to go to BR.  Pt assisted to BR, voided 700 cc, assisted back to bed. Strength good x4 extrems, balance good.  Pt answers correctly to name, place, not to time or situation.  Pt reminded not to attempt to get out of bed w/o staff assistance.  Bed alarm on "exiting", call button beside pt, family educated.  Foley placement deferred at this time.

## 2012-10-10 ENCOUNTER — Inpatient Hospital Stay (HOSPITAL_COMMUNITY): Payer: Medicaid Other

## 2012-10-10 DIAGNOSIS — J9600 Acute respiratory failure, unspecified whether with hypoxia or hypercapnia: Secondary | ICD-10-CM

## 2012-10-10 DIAGNOSIS — J9610 Chronic respiratory failure, unspecified whether with hypoxia or hypercapnia: Secondary | ICD-10-CM

## 2012-10-10 DIAGNOSIS — J9611 Chronic respiratory failure with hypoxia: Secondary | ICD-10-CM

## 2012-10-10 DIAGNOSIS — J9601 Acute respiratory failure with hypoxia: Secondary | ICD-10-CM

## 2012-10-10 MED ORDER — NIMODIPINE 30 MG PO CAPS
60.0000 mg | ORAL_CAPSULE | ORAL | Status: DC
  Filled 2012-10-10 (×6): qty 2

## 2012-10-10 MED ORDER — SIMVASTATIN 80 MG PO TABS
80.0000 mg | ORAL_TABLET | Freq: Every day | ORAL | Status: DC
  Administered 2012-10-10 – 2012-10-27 (×17): 80 mg via ORAL
  Filled 2012-10-10 (×18): qty 1

## 2012-10-10 MED ORDER — NIMODIPINE 60 MG/20ML PO SOLN
60.0000 mg | ORAL | Status: DC
  Administered 2012-10-10 – 2012-10-15 (×31): 60 mg via ORAL
  Filled 2012-10-10 (×36): qty 20

## 2012-10-10 NOTE — Evaluation (Signed)
Clinical/Bedside Swallow Evaluation Patient Details  Name: Donzell Coller MRN: [id number] Date of Birth: [birth year]  Today's Date: 10/10/2012 Time: 6213-0865 SLP Time Calculation (min): 24 min  Past Medical History:  Past Medical History  Diagnosis Date  . Headache    Past Surgical History: History reviewed. No pertinent past surgical history. HPI:  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 Patient was complaining of headache and went to Urgent care who prescribed medication; later was found unresponsive at home and taken to ER. Pt self extubated this AM, today will be seen for BSE.   Assessment / Plan / Recommendation Clinical Impression  BSE completed today, pt with mild decrease in oral strength suspect secondary to lethargy. Pt consumed all consistencies with no overt s/s of aspiration and vocal quality remained clear. [name] swallow function appears WFL at this time. Aspiration risks reviewed with pt and RN, instructed pt on safe swallow precautions of slow rate and small bites/sips; pt will require total assist secondary to restraints. Recommend Dys3 (mech soft solids)/thin liquids with meds crushed in puree secondary to possible confusion with medication.     Aspiration Risk  Mild    Diet Recommendation Dysphagia 3 (Mechanical Soft);Thin liquid   Liquid Administration via: Cup;Straw Medication Administration: Crushed with puree Supervision: Full supervision/cueing for compensatory strategies;Staff feed patient Compensations: Slow rate;Small sips/bites Postural Changes and/or Swallow Maneuvers: Seated upright 90 degrees;Upright 30-60 min after meal    Other  Recommendations Oral Care Recommendations: Oral care QID   Follow Up Recommendations       Frequency and Duration min 2x/week  2 weeks   Pertinent  Vitals/Pain n/a    SLP Swallow Goals Patient will consume recommended diet without observed clinical signs of aspiration with: Modified independent assistance Swallow Study Goal #1 - Progress: Progressing toward goal Patient will utilize recommended strategies during swallow to increase swallowing safety with: Modified independent assistance Swallow Study Goal #2 - Progress: Progressing toward goal   Swallow Study Prior Functional Status   WFL    General Date of Onset: 10/08/12 HPI: 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 Patient was complaining of headache and went to Urgent care who prescribed medication; later was found unresponsive at home and taken to ER. Pt self extubated this AM, today will be seen for BSE. Type of Study: Bedside swallow evaluation Diet Prior to this Study: NPO Temperature Spikes Noted: No Respiratory Status: Supplemental O2 delivered via (comment) History of Recent Intubation: Yes Length of Intubations (days): 1 days Date extubated: 10/10/12 (self extubated) Behavior/Cognition: Alert;Cooperative;Pleasant mood;Confused Oral Cavity - Dentition: Adequate natural dentition Self-Feeding Abilities: Other (Comment);Total assist (Pt with restraints) Patient Positioning: Upright in bed Baseline Vocal Quality: Hoarse;Low vocal intensity Volitional Cough: Weak Volitional Swallow: Able to elicit    Oral/Motor/Sensory Function Overall Oral Motor/Sensory Function: Appears within functional limits for tasks assessed   Ice Chips Ice chips: Within functional limits Presentation: Spoon   Thin Liquid Thin Liquid: Within functional limits Presentation: Cup;Spoon    Nectar Thick   NT  Honey Thick   NT  Puree Puree: Within functional limits Presentation: Spoon   Solid   GO    Solid: Within  functional  limits       Chyrel Masson MA CCC-SLP 10/10/2012,11:59 AM [phone]

## 2012-10-10 NOTE — Progress Notes (Signed)
PULMONARY  / CRITICAL CARE MEDICINE  Name: Richard Davenport MRN: [id number] DOB: 20-Mar-1960    ADMISSION DATE:  10/08/2012 CONSULTATION DATE:  10/09/2012  REFERRING MD :  Dr. 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 Pulmonary critical care medicine has been consulted for ventilator management. Currently, the patient is intubated and sedated and unable to provide any history. His daughter is at the bedside who confirms the history obtained by the emergency room and neurosurgeons at the patient was complaining of headache yesterday and then became unresponsive.     SIGNIFICANT EVENTS / STUDIES:  1. CT 9/18 [facility] with ventriculomegaly 2. Angiogram/Basilar Artery Coiling 9/19  LINES / TUBES: 1. ETT 9/19  CULTURES: 1. None  ANTIBIOTICS: 1. None   SUBJECTIVE/OVERNIGHT/INTERVAL HX 10/10/12: Self extubated. Awake but not oriented but calm  PHYSICAL EXAM  VITAL SIGNS: Temp:  [96.5 F (35.8 C)-100.2 F (37.9 C)] 100.2 F (37.9 C) (09/20 0800) Pulse Rate:  [59-78] 59 (09/20 1000) Resp:  [14-22] 16 (09/20 1000) BP: (102-132)/(65-87) 112/69 mmHg (09/20 1000) SpO2:  [98 %-100 %] 100 % (09/20 1000) Arterial Line BP: (113-159)/(63-77) 157/75 mmHg (09/20 1000) FiO2 (%):  [30 %-40 %] 30 % (09/20 0800)  HEMODYNAMICS:    VENTILATOR SETTINGS: Vent Mode:  [-] PSV;CPAP FiO2 (%):  [30 %-40 %] 30 % Set Rate:  [14 bmp] 14 bmp Vt Set:  [420 mL-550 mL] 420 mL PEEP:  [5 cmH20] 5 cmH20 Pressure Support:  [5 cmH20] 5 cmH20 Plateau Pressure:  [11 cmH20-18 cmH20] 11 cmH20  INTAKE / OUTPUT: Intake/Output     09/19 0701 - 09/20 0700 09/20 0701 - 09/21 0700   I.V. (mL/kg) 3130.1 (35.4) 411.9 (4.7)   IV Piggyback 210 105   Total  Intake(mL/kg) 3340.1 (37.8) 516.9 (5.8)   Urine (mL/kg/hr) 3275 (1.5) 485 (1.4)   Drains 135 (0.1) 44 (0.1)   Blood 50 (0)    Total Output 3460 529   Net -119.9 -12.1          PHYSICAL EXAMINATION: General:  Middle-aged male in no acute distress Neuro:  Awake. Calm. Orinted x 1. Moves all 4s HEENT:  Sclera anicteric, conjunctiva pink. Endotracheal tube present. Neck:  Trachea supple in midline. No lymphadenopathy or JVD Cardiovascular:  Regular rate rhythm, normal S1-S2, no murmurs rubs or gallops Lungs:  Clear auscultation bilaterally; mechanical breath sounds Abdomen:  Soft, nontender, nondistended, positive bowel sounds Musculoskeletal:  No clubbing cyanosis or edema Skin:  Intact, no rashes or skin break  LABS:  PULMONARY  Recent Labs Lab 10/08/12 2144 10/09/12 1935 10/09/12 2256  PHART  --  7.353 7.386  PCO2ART  --  39.8 37.6  PO2ART  --  163.0* 98.0  HCO3  --  22.1 22.6  TCO2 23 23 24   O2SAT  --  99.0 98.0    CBC  Recent Labs Lab 10/08/12 2131 10/08/12 2144  HGB 15.3 15.3  HCT 42.6 45.0  WBC 9.8  --   PLT 152  --     COAGULATION  Recent Labs Lab 10/08/12 2131  INR 1.16    CARDIAC   Recent Labs Lab 10/08/12 2131  TROPONINI <0.30   No results found for this basename: PROBNP,  in the last 168 hours  CHEMISTRY  Recent Labs Lab 10/08/12 2131 10/08/12 2144  NA 142 144  K 3.8 3.8  CL 104 107  CO2 26  --   GLUCOSE 129* 125*  BUN 16 16  CREATININE 1.04 1.00  CALCIUM 9.4  --    Estimated Creatinine Clearance: 95.5 ml/min (by C-G formula based on Cr of 1).   LIVER  Recent Labs Lab 10/08/12 2131  AST 22  ALT 19  ALKPHOS 91  BILITOT 0.5  PROT 8.1  ALBUMIN 4.0  INR 1.16     INFECTIOUS No results found for this basename: LATICACIDVEN, PROCALCITON,  in the last 168 hours   ENDOCRINE CBG (last 3)   Recent Labs  10/08/12 2112  GLUCAP 109*         IMAGING x48h  Ct Angio Head W/cm &/or Wo Cm  10/09/2012    CLINICAL DATA:  FOLLOW UP code stroke  EXAM: CT ANGIOGRAPHY HEAD  TECHNIQUE: Multidetector CT imaging of the head was performed using the standard protocol during bolus administration of intravenous contrast. Multiplanar CT image reconstructions including MIPs were obtained to evaluate the vascular anatomy.  CONTRAST:  50mL OMNIPAQUE IOHEXOL 350 MG/ML SOLN  COMPARISON:  Head CT from 1 day prior  FINDINGS: Non-CTA portion shows relatively stable volume of diffuse subarachnoid hemorrhage, most dense in the basal cisterns, with intraventricular extension into the 4th and lateral ventricles, layering in the occipital horns. There is mild hydrocephalus, unchanged. Small area of apparent cortical low-attenuation in the right occipital region is very discrete, and is favored a prominent sulcus on thin slice imaging.  Extensive inflammatory paranasal sinus disease. There is opacification of the right maxillary antrum (chronically with atelectasis and wall thickening and bowed at medial wall), ethmoid sinuses, and bilateral frontal sinuses. There is scattered polypoid mucosal thickening in the left nasal cavity. Numerous left ethmoid air cells are also opacified.  Posterior circulation: Mildly left dominant vertebrobasilar system. There is a superiorly directed aneurysm from the basilar tip which measures 5.5 mm base to dome and 5 mm in maximal diameter. The neck is relatively narrow at 1-2 mm. There is thrombus adherent to the sac, predominant to the left. No major vessel occlusion.  Anterior circulation: No evidence of aneurysm, significant stenosis, or occlusion. Standard anatomy. Small posterior communicating artery present on the left.  These results were called by telephone at the time of interpretation on 10/09/2012 at 12:45 AMto Dr Gerlene Fee, who verbally acknowledged these results.  Review of the MIP images confirms the above findings.  IMPRESSION: 1. 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  Left dominant vertebral artery. 2. No additional intracranial aneurysm identified. 3. Compared to head CT earlier today, unchanged volume of subarachnoid/intraventricular hemorrhage and hydrocephalus. 4. 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   Electronically Signed   By: Tiburcio Pea   On: 10/09/2012 00:52   Ct Head Wo Contrast  10/09/2012   *RADIOLOGY REPORT*  Clinical Data: Subarachnoid and intraventricular hemorrhage  CT HEAD WITHOUT CONTRAST  Technique:  Contiguous axial images were obtained from the base of the skull through the vertex without contrast.  Comparison: Prior CTA from earlier on the same day as well as CT from 10/08/2012  Findings: Extensive subarachnoid hemorrhage within the basilar cisterns is not significantly changed as compared to the most recent examination. Small amount of subarachnoid hemorrhage is also seen layering within cortical sulci within the parietal lobes  bilaterally.  Intraventricular hemorrhage within the fourth ventricle is not significantly changed. There is increased blood within the occipital horns of the lateral ventricles bilaterally, likely secondary to redistribution.  Overall, the lateral ventricles, third ventricle, and fourth ventricle remain prominent, unchanged as compared to prior examination.  There is no midline shift.  No transependymal flow of CSF is identified. There is no extra-axial fluid collection.  No new hemorrhage is identified.  No acute infarct is identified.  Gray-white matter differentiation is maintained.  Extensive pansinusitis again noted.  IMPRESSION:  1.  No significant interval change in extensive subarachnoid hemorrhage within the basilar cisterns  2.  Similar intraventricular hemorrhage within the fourth ventricle with increased blood within the occipital horns of the lateral ventricles bilaterally, likely secondary to redistribution.  Hydrocephalous is not significantly changed as compared to prior exam.   Original Report Authenticated By: Rise Mu, M.D.   Ct Head Wo Contrast  10/08/2012   *RADIOLOGY REPORT*  Clinical Data: Altered mental status, vomiting.  CT HEAD WITHOUT CONTRAST  Technique:  Contiguous axial images were obtained from the base of the skull through the vertex without contrast.  Comparison: None.  Findings: Bony calvarium appears to be intact.  Frontal, bilateral ethmoid and right maxillary sinusitis is noted.  Intracranially, subarachnoid hemorrhage is noted in the basal cisterns as well as the sylvian fissure appears bilaterally.  Intraventricular hemorrhage is noted in the fourth ventricle, posterior horns bilaterally of the third ventricles and possibly dependent portion of third ventricle.  There may be minimal ventricular dilatation currently.  No midline shift is noted.  IMPRESSION: Findings are consistent with extensive sinusitis as described above. Subarachnoid hemorrhage is noted in the basal cisterns and bilateral sylvian fissures.  There also appears to be some degree of intraventricular hemorrhage present as described above, with associated minimal to mild degree of ventricular dilatation present as well. These findings are concerning for possible intracranial aneurysm.  Critical Value/emergent results were called by telephone at the time of interpretation on October 08, 2012 at 08:30 p.m. to [facility] and Dr. Amada Jupiter, who verbally acknowledged these results.   Original Report Authenticated By: Lupita Raider.,  M.D.   Portable Chest Xray In Am  10/10/2012   CLINICAL DATA:  Respiratory failure  EXAM: PORTABLE CHEST - 1 VIEW  COMPARISON:  10/09/2012  FINDINGS: Endotracheal tube terminates 3.5 cm above the carina.  Lungs are clear. No pleural effusion or pneumothorax.  The heart is normal in size.  Interval placement of an enteric tube which courses into the stomach.  IMPRESSION: Endotracheal  tube terminates 3.5 cm above the carina.   Electronically Signed   By: Charline Bills M.D.   On: 10/10/2012 09:23   Portable Chest Xray  10/09/2012   CLINICAL DATA:  Hypoxia  EXAM: PORTABLE CHEST - 1 VIEW  COMPARISON:  None.  FINDINGS: Endotracheal tube tip is 2.1 cm above the carina. No pneumothorax. Lungs clear. Heart is upper normal in size with normal pulmonary vascularity. No adenopathy.  IMPRESSION: Endotracheal tube as described. No pneumothorax. No edema or consolidation.   Electronically Signed   By: Bretta Bang   On: 10/09/2012 20:05   Dg Abd Portable 1v  10/09/2012   CLINICAL DATA:  Subarachnoid hemorrhage  EXAM: PORTABLE ABDOMEN - 1 VIEW  COMPARISON:  None.  FINDINGS: Nasogastric tube extends to the pylorus. Normal bowel gas pattern. No abnormal abdominal calcifications. Regional bones unremarkable.  IMPRESSION: Nasogastric tube to pylorus.   Electronically Signed   By:  Oley Balm M.D.   On: 10/09/2012 23:32        ASSESSMENT / PLAN: Principal Problem:   SAH (subarachnoid hemorrhage) Active Problems:   Altered mental status   PULMONARY A: 1. Respiratory failure: This is secondary to the patient's intracranial injury. There is no evidence of intrinsic dysfunction of the lungs.  9/290/14: SElf extubated. Maintaing airway  P:    Pulmonary toilet  VAP prevention   CARDIOVASCULAR A:  1. No Acute Issue:  Monitor  RENAL A:  1. No Acute Issue:  Monitor  GASTROINTESTINAL A: 1. No Acute Issue but not oriented enough to swallow  PLAN Npo Place panda  HEMATOLOGIC A:   1. No Acute Issue: P Monitor  INFECTIOUS A: 1. No Acute Issue:  P Monitor  ENDOCRINE A:   1. Minimal Hyperglycemia: Likely underlying DM.  P:    Check A1c  q6 Accuchecks  NEUROLOGIC A:  1. SAH:   10/10/12: Awake but not oriented  P:    Management Per NS  STart 21 day nimodipine 10/10/12 to prvent vasospasm  Start 21 day zocor 10/10/12 to prevent  vasosopasm  BEST PRACTICE / DISPOSITION Level of Care:  ICU Consultants:  CCM/NS comanagement Code Status:  Full Diet:  NPO DVT Px:  SCD, SQH when cleared by NS GI Px:  Protonix Skin Integrity:  Intact Social / Family:  NOT At Bedside 10/10/12  TODAY'S SUMMARY:   10/10/12: Self extubated. Start nimodipine and statin for vasospsasm prophylaxis    The patient is critically ill with multiple organ systems failure and requires high complexity decision making for assessment and support, frequent evaluation and titration of therapies, application of advanced monitoring technologies and extensive interpretation of multiple databases.   Critical Care Time devoted to patient care services described in this note is  35  Minutes.  Dr. Kalman Shan, M.D., [facility].C.P Pulmonary and Critical Care Medicine Staff Physician [facility]monary and Critical Care Pager: [phone], If no answer or between  15:00h - 7:00h: call [phone]  10/10/2012 11:15 AM

## 2012-10-10 NOTE — Progress Notes (Signed)
Patient ID: Richard Davenport, male   DOB: Oct 12, 1960, 52 y.o.   MRN: [id number] Exam appears stable. On IV sedation. Ventric patent and draining well. Wean to extubate as tolerated.

## 2012-10-10 NOTE — Procedures (Signed)
Extubation Procedure Note  Patient Details:   Name: Richard Davenport DOB: [birth year] MRN: [id number]   Airway Documentation:     Evaluation  O2 sats: stable throughout Complications: No apparent complications Patient did tolerate procedure well. Bilateral Breath Sounds: Clear Suctioning: Airway Yes  Called to patient's room due to patient self extubated.  Patient doing well, placed on 2l [place].  Patient able to vocalize.  No stridor noted, BBS clear.  No complications noted.  Will continue to monitor.  [name] [facility] 10/10/2012, 8:34 AM

## 2012-10-11 LAB — BASIC METABOLIC PANEL
BUN: 12 mg/dL (ref 6–23)
CO2: 21 mEq/L (ref 19–32)
Calcium: 8.1 mg/dL — ABNORMAL LOW (ref 8.4–10.5)
GFR calc non Af Amer: 81 mL/min — ABNORMAL LOW (ref >90)
Glucose, Bld: 117 mg/dL — ABNORMAL HIGH (ref 70–99)
Potassium: 3 mEq/L — ABNORMAL LOW (ref 3.5–5.1)

## 2012-10-11 LAB — CBC WITH DIFFERENTIAL/PLATELET
Eosinophils Absolute: 0.1 10*3/uL (ref 0.0–0.7)
Eosinophils Relative: 1 % (ref 0–5)
Hemoglobin: 12.6 g/dL — ABNORMAL LOW (ref 13.0–17.0)
Lymphocytes Relative: 22 % (ref 12–46)
Lymphs Abs: 1.6 10*3/uL (ref 0.7–4.0)
MCH: 34.3 pg — ABNORMAL HIGH (ref 26.0–34.0)
MCV: 97.3 fL (ref 78.0–100.0)
Monocytes Absolute: 0.6 10*3/uL (ref 0.1–1.0)
Monocytes Relative: 7 % (ref 3–12)
RBC: 3.67 MIL/uL — ABNORMAL LOW (ref 4.22–5.81)

## 2012-10-11 MED ORDER — PANTOPRAZOLE SODIUM 40 MG PO TBEC
40.0000 mg | DELAYED_RELEASE_TABLET | Freq: Every day | ORAL | Status: DC
  Administered 2012-10-11 – 2012-10-26 (×16): 40 mg via ORAL
  Filled 2012-10-11 (×16): qty 1

## 2012-10-11 MED ORDER — POTASSIUM CHLORIDE CRYS ER 20 MEQ PO TBCR
40.0000 meq | EXTENDED_RELEASE_TABLET | ORAL | Status: AC
  Administered 2012-10-11 (×2): 40 meq via ORAL
  Filled 2012-10-11 (×2): qty 2

## 2012-10-11 NOTE — Progress Notes (Signed)
PULMONARY  / CRITICAL CARE MEDICINE  Name: Richard Davenport MRN: [id number] DOB: Oct 12, 1960    ADMISSION DATE:  10/08/2012 CONSULTATION DATE:  10/09/2012  REFERRING MD :  Dr. Conchita Paris PRIMARY SERVICE: Neurosurgery  CHIEF COMPLAINT:  SAH  BRIEF PATIENT DESCRIPTION:  Admitted via ED 9/18 with SAH. Underwent coiling. Self extubated 9/20  SIGNIFICANT EVENTS / STUDIES:  9/18 CT head: SAH with ventriculomegaly 9/18: Diagnostic Angiogram. Coiling of basilar aneurysm. Placement of External ventricular drain 9/19 Right frontal ventriculostomy placement 9/19 CT head: No significant interval change in extensive subarachnoid hemorrhage within the basilar cisterns   LINES / TUBES: ETT 9/19 >> 9/20 (self extubated)  CULTURES:   ANTIBIOTICS:    SUBJECTIVE/OVERNIGHT/INTERVAL HX No distress  PHYSICAL EXAM  VITAL SIGNS: Temp:  [98.2 F (36.8 C)-101.9 F (38.8 C)] 101.9 F (38.8 C) (09/21 2000) Pulse Rate:  [55-75] 74 (09/21 2000) Resp:  [13-21] 18 (09/21 2000) BP: (99-122)/(55-75) 116/75 mmHg (09/21 2000) SpO2:  [91 %-100 %] 97 % (09/21 2000)  HEMODYNAMICS:    VENTILATOR SETTINGS:    INTAKE / OUTPUT: Intake/Output     09/21 0701 - 09/22 0700   P.O. 240   I.V. (mL/kg) 1200 (13.6)   IV Piggyback 105   Total Intake(mL/kg) 1545 (17.5)   Urine (mL/kg/hr) 1075 (0.9)   Drains 126 (0.1)   Total Output 1201   Net +344         PHYSICAL EXAMINATION: General: NAD Neuro:  RASS 0. Moves all 4s HEENT:  Ventric drain in place, otherwise WNL Cardiovascular:  RRR s M Lungs: Clear Abdomen: Soft, NT, NAB Ext: no edema  LABS:  PULMONARY  Recent Labs Lab 10/08/12 2144 10/09/12 1935 10/09/12 2256  PHART  --  7.353 7.386  PCO2ART  --  39.8 37.6  PO2ART  --  163.0* 98.0  HCO3  --  22.1 22.6  TCO2 23 23 24   O2SAT  --  99.0 98.0    CBC  Recent Labs Lab 10/08/12 2131 10/08/12 2144 10/11/12 0531  HGB 15.3 15.3 12.6*  HCT 42.6 45.0 35.7*  WBC 9.8  --  7.6  PLT 152  --   92*    COAGULATION  Recent Labs Lab 10/08/12 2131  INR 1.16    CARDIAC    Recent Labs Lab 10/08/12 2131  TROPONINI <0.30   No results found for this basename: PROBNP,  in the last 168 hours   CHEMISTRY  Recent Labs Lab 10/08/12 2131 10/08/12 2144 10/11/12 0531  NA 142 144 141  K 3.8 3.8 3.0*  CL 104 107 109  CO2 26  --  21  GLUCOSE 129* 125* 117*  BUN 16 16 12   CREATININE 1.04 1.00 1.05  CALCIUM 9.4  --  8.1*  MG  --   --  2.0  PHOS  --   --  2.8   Estimated Creatinine Clearance: 91 ml/min (by C-G formula based on Cr of 1.05).   LIVER  Recent Labs Lab 10/08/12 2131  AST 22  ALT 19  ALKPHOS 91  BILITOT 0.5  PROT 8.1  ALBUMIN 4.0  INR 1.16     INFECTIOUS No results found for this basename: LATICACIDVEN, PROCALCITON,  in the last 168 hours   ENDOCRINE CBG (last 3)   Recent Labs  10/08/12 2112  GLUCAP 109*    CXR: NNF    ASSESSMENT / PLAN: Principal Problem:   SAH (subarachnoid hemorrhage) Active Problems:   Altered mental status   Acute respiratory failure with hypoxia  PULMONARY A: Resp failure, resolved  P: Monitor in ICU   CARDIOVASCULAR A:  No acute issues  P: Monitor  RENAL A: hypokalemia  P: Monitor BMET intermittently Correct electrolytes as indicated   GASTROINTESTINAL A: No issues P: Cont DIII diet with supervision  HEMATOLOGIC A:   Thrombocytopenia, no acute bleeding P: Monitor CBC intermittently Transfuse plts as needed for bleeding  INFECTIOUS A: NO issues  P: Monitor off abx  ENDOCRINE A:   Mild hyperglycemia without prior hx of DM  P:   Monitor glu on BMET Begin SSI if needed for glu > 180  NEUROLOGIC A:  SAH  P:   Mgmt per NS   TODAY'S SUMMARY:     Billy Fischer, MD ; [facility] service Mobile [phone].  After 5:30 PM or weekends, call [phone]   10/11/2012 8:41 PM

## 2012-10-11 NOTE — Progress Notes (Signed)
eLink Physician-Brief Progress Note Patient Name: Richard Davenport DOB: [birth year] MRN: [id number]  Date of Service  10/11/2012   HPI/Events of Note   Temp, tylenal given at 9 pm  eICU Interventions  pcxr for infiltrate now and in am , send UA, bc Goal to control temps in setting neuro injury      Nelda Bucks. 10/11/2012, 11:54 PM

## 2012-10-11 NOTE — Progress Notes (Signed)
Patient ID: Richard Davenport, male   DOB: Oct 06, 1960, 52 y.o.   MRN: [id number] Patient looks quite good today. He is awake and alert with good attention span. He is conversant. He has no aphasia. He is moving all extremities. His EVD is patent at 15 cm. He is extubated and looking quite good. I am very pleased with his progress. His vital signs are stable. Sodium is 141. His potassium is low and will be replaced today.

## 2012-10-11 NOTE — Progress Notes (Signed)
eLink Physician-Brief Progress Note Patient Name: Richard Davenport DOB: 10-03-1960 MRN: [id number]  Date of Service  10/11/2012   HPI/Events of Note    Hypokalemia  eICU Interventions  Potassium replaced   Intervention Category Intermediate Interventions: Electrolyte abnormality - evaluation and management  DETERDING,ELIZABETH 10/11/2012, 6:28 AM

## 2012-10-12 ENCOUNTER — Inpatient Hospital Stay (HOSPITAL_COMMUNITY): Payer: Medicaid Other

## 2012-10-12 ENCOUNTER — Encounter (HOSPITAL_COMMUNITY): Payer: Self-pay | Admitting: Neurosurgery

## 2012-10-12 DIAGNOSIS — R4182 Altered mental status, unspecified: Secondary | ICD-10-CM

## 2012-10-12 DIAGNOSIS — I609 Nontraumatic subarachnoid hemorrhage, unspecified: Principal | ICD-10-CM

## 2012-10-12 LAB — TYPE AND SCREEN
ABO/RH(D): O POS
Unit division: 0

## 2012-10-12 LAB — CBC WITH DIFFERENTIAL/PLATELET
Basophils Relative: 0 % (ref 0–1)
Eosinophils Absolute: 0.1 10*3/uL (ref 0.0–0.7)
HCT: 38.3 % — ABNORMAL LOW (ref 39.0–52.0)
Hemoglobin: 13.4 g/dL (ref 13.0–17.0)
MCH: 33.5 pg (ref 26.0–34.0)
MCHC: 35 g/dL (ref 30.0–36.0)
Monocytes Absolute: 0.6 10*3/uL (ref 0.1–1.0)
Monocytes Relative: 7 % (ref 3–12)
Neutrophils Relative %: 70 % (ref 43–77)
RDW: 12.3 % (ref 11.5–15.5)

## 2012-10-12 LAB — BASIC METABOLIC PANEL
BUN: 12 mg/dL (ref 6–23)
Creatinine, Ser: 0.99 mg/dL (ref 0.50–1.35)
GFR calc Af Amer: >90 mL/min (ref >90)
GFR calc non Af Amer: >90 mL/min (ref >90)
Potassium: 3.2 mEq/L — ABNORMAL LOW (ref 3.5–5.1)
Sodium: 136 mEq/L (ref 135–145)

## 2012-10-12 LAB — URINALYSIS, ROUTINE W REFLEX MICROSCOPIC
Bilirubin Urine: NEGATIVE
Ketones, ur: NEGATIVE mg/dL
Leukocytes, UA: NEGATIVE
Nitrite: NEGATIVE
Specific Gravity, Urine: 1.017 (ref 1.005–1.030)
Urobilinogen, UA: 2 mg/dL — ABNORMAL HIGH (ref 0.0–1.0)

## 2012-10-12 MED ORDER — POTASSIUM CHLORIDE CRYS ER 20 MEQ PO TBCR
40.0000 meq | EXTENDED_RELEASE_TABLET | Freq: Three times a day (TID) | ORAL | Status: AC
  Administered 2012-10-12 (×2): 40 meq via ORAL
  Filled 2012-10-12 (×2): qty 2

## 2012-10-12 MED ORDER — LEVETIRACETAM 500 MG PO TABS
500.0000 mg | ORAL_TABLET | Freq: Two times a day (BID) | ORAL | Status: DC
  Administered 2012-10-12 – 2012-10-14 (×6): 500 mg via ORAL
  Filled 2012-10-12 (×8): qty 1

## 2012-10-12 MED ORDER — INFLUENZA VAC SPLIT QUAD 0.5 ML IM SUSP
0.5000 mL | INTRAMUSCULAR | Status: AC
  Administered 2012-10-13: 0.5 mL via INTRAMUSCULAR
  Filled 2012-10-12: qty 0.5

## 2012-10-12 NOTE — Progress Notes (Signed)
Pt seen and examined. No issues overnight. Pt denies any c/o including HA, N/V, new numbness/tingling/weakness.  EXAM: Temp:  [98.2 F (36.8 C)-101.9 F (38.8 C)] 100.8 F (38.2 C) (09/22 0700) Pulse Rate:  [59-93] 93 (09/22 1000) Resp:  [13-22] 13 (09/22 1000) BP: (107-131)/(64-75) 125/70 mmHg (09/22 1000) SpO2:  [93 %-100 %] 98 % (09/22 1000) Intake/Output     09/21 0701 - 09/22 0700 09/22 0701 - 09/23 0700   P.O. 240    I.V. (mL/kg) 2400 (27.1) 300 (3.4)   IV Piggyback 210    Total Intake(mL/kg) 2850 (32.2) 300 (3.4)   Urine (mL/kg/hr) 2175 (1)    Drains 245 (0.1) 31 (0.1)   Total Output 2420 31   Net +430 +269         EVD patent, open at 10, 245cc output x24 hrs, blood tinged CSF Awake, alert, oriented to person, place, year CN grossly intact Follows commands BUE/BLE Groin site c/d/i  LABS: Lab Results  Component Value Date   CREATININE 0.99 10/12/2012   BUN 12 10/12/2012   NA 136 10/12/2012   K 3.2* 10/12/2012   CL 106 10/12/2012   CO2 19 10/12/2012   Lab Results  Component Value Date   WBC 8.0 10/12/2012   HGB 13.4 10/12/2012   HCT 38.3* 10/12/2012   MCV 95.8 10/12/2012   PLT 95* 10/12/2012    IMAGING: No new imaging  IMPRESSION: - 52 y.o. male SAH d# 4 , s/p coiling basilar aneurysm  PLAN: - Cont close neurologic observation - Maintain EVD at 10 - Spasm prophylaxis - Nimotop/Zocor. Monitor with MWF TCD - Can allow BP to autoregulate - Doing well post-extubation - PO diet - electrolyte replacement, maintain euvolemia - Febrile - tylenol, CXR, will send CSF tomorrow.

## 2012-10-12 NOTE — Progress Notes (Signed)
Transcranial Doppler  Date POD PCO2 HCT BP  MCA ACA PCA OPHT SIPH VERT Basilar  10/12/12 MS     Right  Left   96  28   -34  -24   -24  -37   34  27   -31  29   -46  -25     -48         Right  Left                                            Right  Left                                             Right  Left                                             Right  Left                                            Right  Left                                            Right  Left                                        MCA = Middle Cerebral Artery      OPHT = Opthalmic Artery     BASILAR = Basilar Artery   ACA = Anterior Cerebral Artery     SIPH = Carotid Siphon PCA = Posterior Cerebral Artery   VERT = Verterbral Artery                   Normal MCA = 62+\-12 ACA = 50+\-12 PCA = 42+\-23     10/12/2012 5:01 PM Gertie Fey, RVT, RDCS, RDMS

## 2012-10-12 NOTE — Progress Notes (Signed)
Speech Language Pathology Dysphagia Treatment Patient Details Name: Richard Davenport MRN: [id number] DOB: [birth year] Today's Date: 10/12/2012 Time: 6433-2951 SLP Time Calculation (min): 10 min  Assessment / Plan / Recommendation Clinical Impression  Pt demonstrates adequate tolerance of POs again without any evidence of struggle or aspiraiton. Pt has spiked temps, but CXR is clear. RN also reports good tolerance of POs and meds. Will upgrade diet to regular/thin and sign off. Pt may take meds with water. No SLP f/u needed for dysphagia, may need cognitive linguistic eval.     Diet Recommendation  Initiate / Change Diet: Regular;Thin liquid    SLP Plan All goals met   Pertinent Vitals/Pain NA   Swallowing Goals  SLP Swallowing Goals Patient will consume recommended diet without observed clinical signs of aspiration with: Modified independent assistance Swallow Study Goal #1 - Progress: Met Patient will utilize recommended strategies during swallow to increase swallowing safety with: Modified independent assistance Swallow Study Goal #2 - Progress: Met  General Temperature Spikes Noted: Yes Respiratory Status: Supplemental O2 delivered via (comment) Behavior/Cognition: Alert;Cooperative;Pleasant mood;Confused Oral Cavity - Dentition: Adequate natural dentition Patient Positioning: Upright in bed  Oral Cavity - Oral Hygiene Does patient have any of the following "at risk" factors?: None of the above   Dysphagia Treatment Treatment focused on: Skilled observation of diet tolerance Treatment Methods/Modalities: Skilled observation Patient observed directly with PO's: Yes Type of PO's observed: Thin liquids;Regular Feeding: Total assist (due to restraints) Liquids provided via: Cup;Straw Type of cueing:  (noen)   GO    [name], MA CCC-SLP [phone]  [name] 10/12/2012, 10:02 AM

## 2012-10-12 NOTE — Progress Notes (Signed)
Dr Newell Coral notified of patient's IVC drain settings. Drain remains at [employer] and is not transducing ICP's. Night nurse notified to report settings to day nurse 9/23.

## 2012-10-12 NOTE — Progress Notes (Signed)
PULMONARY  / CRITICAL CARE MEDICINE  Name: Richard Davenport MRN: [id number] DOB: March 18, 1960    ADMISSION DATE:  10/08/2012 CONSULTATION DATE:  10/09/2012  REFERRING MD :  Dr. Conchita Paris PRIMARY SERVICE: Neurosurgery  CHIEF COMPLAINT:  SAH  BRIEF PATIENT DESCRIPTION:  Admitted via ED 9/18 with SAH. Underwent coiling. Self extubated 9/20  SIGNIFICANT EVENTS / STUDIES:  9/18 CT head: SAH with ventriculomegaly 9/18: Diagnostic Angiogram. Coiling of basilar aneurysm. Placement of External ventricular drain 9/19 Right frontal ventriculostomy placement 9/19 CT head: No significant interval change in extensive subarachnoid hemorrhage within the basilar cisterns   LINES / TUBES: ETT 9/19 >> 9/20 (self extubated)  CULTURES: None  ANTIBIOTICS: None  SUBJECTIVE/OVERNIGHT/INTERVAL HX No distress  PHYSICAL EXAM  VITAL SIGNS: Temp:  [98.2 F (36.8 C)-101.9 F (38.8 C)] 100.8 F (38.2 C) (09/22 0700) Pulse Rate:  [59-93] 93 (09/22 1000) Resp:  [13-22] 13 (09/22 1000) BP: (107-131)/(64-75) 125/70 mmHg (09/22 1000) SpO2:  [93 %-100 %] 98 % (09/22 1000)  HEMODYNAMICS:   VENTILATOR SETTINGS:   INTAKE / OUTPUT: Intake/Output     09/21 0701 - 09/22 0700 09/22 0701 - 09/23 0700   P.O. 240    I.V. (mL/kg) 2400 (27.1) 300 (3.4)   IV Piggyback 210    Total Intake(mL/kg) 2850 (32.2) 300 (3.4)   Urine (mL/kg/hr) 2175 (1)    Drains 245 (0.1) 31 (0.1)   Total Output 2420 31   Net +430 +269         PHYSICAL EXAMINATION: General: NAD Neuro:  RASS +1. Moves all 4s HEENT:  Ventric drain in place, otherwise WNL Cardiovascular:  RRR s M Lungs: Clear Abdomen: Soft, NT, NAB Ext: no edema  LABS:  PULMONARY  Recent Labs Lab 10/08/12 2144 10/09/12 1935 10/09/12 2256  PHART  --  7.353 7.386  PCO2ART  --  39.8 37.6  PO2ART  --  163.0* 98.0  HCO3  --  22.1 22.6  TCO2 23 23 24   O2SAT  --  99.0 98.0   CBC  Recent Labs Lab 10/08/12 2131 10/08/12 2144 10/11/12 0531 10/12/12 0650   HGB 15.3 15.3 12.6* 13.4  HCT 42.6 45.0 35.7* 38.3*  WBC 9.8  --  7.6 8.0  PLT 152  --  92* 95*   COAGULATION  Recent Labs Lab 10/08/12 2131  INR 1.16   CARDIAC   Recent Labs Lab 10/08/12 2131  TROPONINI <0.30   No results found for this basename: PROBNP,  in the last 168 hours  CHEMISTRY  Recent Labs Lab 10/08/12 2131 10/08/12 2144 10/11/12 0531 10/12/12 0650  NA 142 144 141 136  K 3.8 3.8 3.0* 3.2*  CL 104 107 109 106  CO2 26  --  21 19  GLUCOSE 129* 125* 117* 121*  BUN 16 16 12 12   CREATININE 1.04 1.00 1.05 0.99  CALCIUM 9.4  --  8.1* 8.4  MG  --   --  2.0  --   PHOS  --   --  2.8  --    Estimated Creatinine Clearance: 96.5 ml/min (by C-G formula based on Cr of 0.99).  LIVER  Recent Labs Lab 10/08/12 2131  AST 22  ALT 19  ALKPHOS 91  BILITOT 0.5  PROT 8.1  ALBUMIN 4.0  INR 1.16   INFECTIOUS No results found for this basename: LATICACIDVEN, PROCALCITON,  in the last 168 hours  ENDOCRINE CBG (last 3)  No results found for this basename: GLUCAP,  in the last 72 hours  CXR:  NNF  ASSESSMENT / PLAN: Principal Problem:   SAH (subarachnoid hemorrhage) Active Problems:   Altered mental status   Acute respiratory failure with hypoxia  PULMONARY A: Resp failure, resolved P: - Monitor in ICU while drain is in place.  CARDIOVASCULAR A:  No acute issues P: - BP control as ordered.  RENAL A: hypokalemia P: - Monitor BMET intermittently. - Correct electrolytes as indicated.  GASTROINTESTINAL A: No issues P: - Continue diet.  HEMATOLOGIC A:   Thrombocytopenia, no acute bleeding P: - Monitor CBC intermittently  INFECTIOUS A: Febrile overnight but no evidence of infection. P: - Low threshold for abx given drain. - Will check CBC in AM, if WBC continues to rise.  ENDOCRINE A:   Mild hyperglycemia without prior hx of DM P:   Monitor glu on BMET Begin SSI if needed for glu > 180  NEUROLOGIC A:  SAH  P:   Mgmt per  NS   TODAY'S SUMMARY: Hold in ICU for drain, low threshold for abx.  Alyson Reedy, M.D. [facility] Pulmonary/Critical Care Medicine. Pager: [phone]. After hours pager: [phone].

## 2012-10-12 NOTE — Progress Notes (Signed)
UR completed.  Dasean Brow, RN BSN M[facility] CCM Trauma/Neuro ICU Case Manager [phone]  

## 2012-10-13 LAB — BASIC METABOLIC PANEL
CO2: 19 mEq/L (ref 19–32)
Calcium: 8 mg/dL — ABNORMAL LOW (ref 8.4–10.5)
Creatinine, Ser: 1.14 mg/dL (ref 0.50–1.35)
GFR calc non Af Amer: 73 mL/min — ABNORMAL LOW (ref >90)
Glucose, Bld: 111 mg/dL — ABNORMAL HIGH (ref 70–99)
Potassium: 3.5 mEq/L (ref 3.5–5.1)
Sodium: 136 mEq/L (ref 135–145)

## 2012-10-13 LAB — CBC
MCH: 34.5 pg — ABNORMAL HIGH (ref 26.0–34.0)
MCHC: 36.7 g/dL — ABNORMAL HIGH (ref 30.0–36.0)
MCV: 93.8 fL (ref 78.0–100.0)
Platelets: 98 10*3/uL — ABNORMAL LOW (ref 150–400)
RBC: 3.86 MIL/uL — ABNORMAL LOW (ref 4.22–5.81)
RDW: 12.2 % (ref 11.5–15.5)

## 2012-10-13 LAB — CSF CELL COUNT WITH DIFFERENTIAL
Eosinophils, CSF: 0 % (ref 0–1)
Monocyte-Macrophage-Spinal Fluid: 3 % — ABNORMAL LOW (ref 15–45)
RBC Count, CSF: 54500 /mm3 — ABNORMAL HIGH

## 2012-10-13 LAB — GRAM STAIN

## 2012-10-13 LAB — PHOSPHORUS: Phosphorus: 2.7 mg/dL (ref 2.3–4.6)

## 2012-10-13 LAB — PROTEIN AND GLUCOSE, CSF
Glucose, CSF: 57 mg/dL (ref 43–76)
Total  Protein, CSF: 46 mg/dL — ABNORMAL HIGH (ref 15–45)

## 2012-10-13 LAB — MAGNESIUM: Magnesium: 1.8 mg/dL (ref 1.5–2.5)

## 2012-10-13 MED ORDER — DEXTROSE 5 % IV SOLN
2.0000 g | INTRAVENOUS | Status: DC
  Administered 2012-10-13: 2 g via INTRAVENOUS
  Filled 2012-10-13 (×2): qty 2

## 2012-10-13 MED ORDER — MAGNESIUM SULFATE 40 MG/ML IJ SOLN
2.0000 g | Freq: Once | INTRAMUSCULAR | Status: AC
  Administered 2012-10-13: 2 g via INTRAVENOUS
  Filled 2012-10-13: qty 50

## 2012-10-13 MED ORDER — POTASSIUM CHLORIDE CRYS ER 20 MEQ PO TBCR
20.0000 meq | EXTENDED_RELEASE_TABLET | ORAL | Status: AC
  Administered 2012-10-13 (×2): 20 meq via ORAL
  Filled 2012-10-13 (×2): qty 1

## 2012-10-13 MED ORDER — VANCOMYCIN HCL IN DEXTROSE 1-5 GM/200ML-% IV SOLN
1000.0000 mg | Freq: Three times a day (TID) | INTRAVENOUS | Status: DC
  Administered 2012-10-13 – 2012-10-16 (×9): 1000 mg via INTRAVENOUS
  Filled 2012-10-13 (×11): qty 200

## 2012-10-13 NOTE — Progress Notes (Signed)
Pt seen and examined. No issues overnight. Pt does not have any c/o except poor sleep overnight.  EXAM: Temp:  [98.1 F (36.7 C)-100.3 F (37.9 C)] 99.3 F (37.4 C) (09/23 0441) Pulse Rate:  [64-97] 71 (09/22 2300) Resp:  [13-24] 19 (09/23 0800) BP: (97-135)/(49-84) 111/71 mmHg (09/23 0800) SpO2:  [97 %-100 %] 99 % (09/23 0800) Intake/Output     09/22 0701 - 09/23 0700 09/23 0701 - 09/24 0700   P.O. 2    I.V. (mL/kg) 2400 (27.1) 100 (1.1)   IV Piggyback 50    Total Intake(mL/kg) 2452 (27.7) 100 (1.1)   Urine (mL/kg/hr) 3300 (1.6)    Drains 238 (0.1) 14 (0.1)   Total Output 3538 14   Net -1086 +86        Urine Occurrence 1 x     EVD in place, bloody CSF draining. 238cc last 24hrs Awake, alert, oriented Follows commands BUE/BLE, good strength  LABS: Lab Results  Component Value Date   CREATININE 1.14 10/13/2012   BUN 13 10/13/2012   NA 136 10/13/2012   K 3.5 10/13/2012   CL 105 10/13/2012   CO2 19 10/13/2012   Lab Results  Component Value Date   WBC 7.2 10/13/2012   HGB 13.3 10/13/2012   HCT 36.2* 10/13/2012   MCV 93.8 10/13/2012   PLT 98* 10/13/2012    IMAGING: TCD velocities reviewed, not indicative of spasm  IMPRESSION: - 52 y.o. male SAH d# 5 s/p basilar aneurysm coiling - neurologically well  PLAN: - Cont close observation - Cont evd at 10 - Nimotop/Zocor - maintain euvolemia - Febrile - send blood, CSF cx

## 2012-10-13 NOTE — Progress Notes (Signed)
[facility] ADULT ICU REPLACEMENT PROTOCOL FOR AM LAB REPLACEMENT ONLY  The patient does apply for the [facility] Adult ICU Electrolyte Replacment Protocol based on the criteria listed below:   1. Is GFR >/= 40 ml/min? yes  Patient's GFR today is 73 2. Is urine output >/= 0.5 ml/kg/hr for the last 6 hours? yes Patient's UOP is 0.5 ml/kg/hr 3. Is BUN < 60 mg/dL? yes  Patient's BUN today is 13 4. Abnormal electrolyte(s):K3.5,Mg1.8 5. Ordered repletion with: KCL&MG 6. If a panic level lab has been reported, has the CCM MD in charge been notified? yes.   Physician:  Wilford Sports MD  Richard Davenport 10/13/2012 5:55 AM

## 2012-10-13 NOTE — Progress Notes (Signed)
PULMONARY  / CRITICAL CARE MEDICINE  Name: Richard Davenport MRN: [id number] DOB: 05-Sep-1960    ADMISSION DATE:  10/08/2012 CONSULTATION DATE:  10/09/2012  REFERRING MD :  Dr. Conchita Paris PRIMARY SERVICE: Neurosurgery  CHIEF COMPLAINT:  SAH  BRIEF PATIENT DESCRIPTION:  Admitted via ED 9/18 with SAH. Underwent coiling. Self extubated 9/20  SIGNIFICANT EVENTS / STUDIES:  9/18 CT head: SAH with ventriculomegaly 9/18: Diagnostic Angiogram. Coiling of basilar aneurysm. Placement of External ventricular drain 9/19 Right frontal ventriculostomy placement 9/19 CT head: No significant interval change in extensive subarachnoid hemorrhage within the basilar cisterns   LINES / TUBES: ETT 9/19 >> 9/20 (self extubated)  CULTURES: None  ANTIBIOTICS: None  SUBJECTIVE/OVERNIGHT/INTERVAL HX No distress  PHYSICAL EXAM  VITAL SIGNS: Temp:  [98.1 F (36.7 C)-100.3 F (37.9 C)] 99.3 F (37.4 C) (09/23 0441) Pulse Rate:  [64-97] 71 (09/22 2300) Resp:  [13-24] 19 (09/23 0800) BP: (97-135)/(49-84) 111/71 mmHg (09/23 0800) SpO2:  [97 %-100 %] 99 % (09/23 0800)  HEMODYNAMICS:   VENTILATOR SETTINGS:   INTAKE / OUTPUT: Intake/Output     09/22 0701 - 09/23 0700 09/23 0701 - 09/24 0700   P.O. 2    I.V. (mL/kg) 2400 (27.1) 100 (1.1)   IV Piggyback 50    Total Intake(mL/kg) 2452 (27.7) 100 (1.1)   Urine (mL/kg/hr) 3300 (1.6)    Drains 238 (0.1) 14 (0.1)   Total Output 3538 14   Net -1086 +86        Urine Occurrence 1 x     PHYSICAL EXAMINATION: General: NAD Neuro:  RASS +1. Moves all 4s HEENT:  Ventric drain in place, otherwise WNL Cardiovascular:  RRR s M Lungs: Clear Abdomen: Soft, NT, NAB Ext: no edema  LABS:  PULMONARY  Recent Labs Lab 10/08/12 2144 10/09/12 1935 10/09/12 2256  PHART  --  7.353 7.386  PCO2ART  --  39.8 37.6  PO2ART  --  163.0* 98.0  HCO3  --  22.1 22.6  TCO2 23 23 24   O2SAT  --  99.0 98.0   CBC  Recent Labs Lab 10/11/12 0531 10/12/12 0650  10/13/12 0410  HGB 12.6* 13.4 13.3  HCT 35.7* 38.3* 36.2*  WBC 7.6 8.0 7.2  PLT 92* 95* 98*   COAGULATION  Recent Labs Lab 10/08/12 2131  INR 1.16   CARDIAC   Recent Labs Lab 10/08/12 2131  TROPONINI <0.30   No results found for this basename: PROBNP,  in the last 168 hours  CHEMISTRY  Recent Labs Lab 10/08/12 2131 10/08/12 2144 10/11/12 0531 10/12/12 0650 10/13/12 0410  NA 142 144 141 136 136  K 3.8 3.8 3.0* 3.2* 3.5  CL 104 107 109 106 105  CO2 26  --  21 19 19   GLUCOSE 129* 125* 117* 121* 111*  BUN 16 16 12 12 13   CREATININE 1.04 1.00 1.05 0.99 1.14  CALCIUM 9.4  --  8.1* 8.4 8.0*  MG  --   --  2.0  --  1.8  PHOS  --   --  2.8  --  2.7   Estimated Creatinine Clearance: 83.8 ml/min (by C-G formula based on Cr of 1.14).  LIVER  Recent Labs Lab 10/08/12 2131  AST 22  ALT 19  ALKPHOS 91  BILITOT 0.5  PROT 8.1  ALBUMIN 4.0  INR 1.16   INFECTIOUS No results found for this basename: LATICACIDVEN, PROCALCITON,  in the last 168 hours  ENDOCRINE CBG (last 3)  No results found for this basename:  GLUCAP,  in the last 72 hours  CXR: NNF  ASSESSMENT / PLAN: Principal Problem:   SAH (subarachnoid hemorrhage) Active Problems:   Altered mental status   Acute respiratory failure with hypoxia  PULMONARY A: Resp failure, resolved P: - Monitor in ICU while drain is in place. - Titrate O2 for sats. - IS per RT.  CARDIOVASCULAR A:  No acute issues P: - BP control as ordered.  RENAL A: hypokalemia P: - Monitor BMET intermittently. - Correct electrolytes as indicated.  GASTROINTESTINAL A: No issues P: - Continue diet.  HEMATOLOGIC A:   Thrombocytopenia, no acute bleeding P: - Monitor CBC intermittently  INFECTIOUS A: Low grade fever overnight but WBC remains stable (100.8). P: - Low threshold for abx given drain. - Hold abx for now (WBC is decreasing and fever is minimal.  ENDOCRINE A:   Mild hyperglycemia without prior hx  of DM P:   - Monitor glu on BMET - No insulin for now.  NEUROLOGIC A:  SAH  P:   - Mgmt per NS.   TODAY'S SUMMARY: Hold in ICU for drain, replace electrolytes, if WBC rises or fever increases further or CSF cultures are note worthy will start abx.  Alyson Reedy, M.D. [facility] Pulmonary/Critical Care Medicine. Pager: [phone]. After hours pager: [phone].

## 2012-10-13 NOTE — Progress Notes (Signed)
Patient has an oral temperature of 102.5, given 650 mg of tylenol and MD Nundkumar notified.  CSF WBC count of 562 and CSF protein count of 46, MD Nundkumar notified.

## 2012-10-13 NOTE — Consult Note (Signed)
PHARMACY CONSULT NOTE - INITIAL  Pharmacy Consult for :   Vancomycin Indication:  Possible Meningitis in pt with ventricular drain;  Cellulitis old IV site  Hospital Problems: Principal Problem:   SAH (subarachnoid hemorrhage) Active Problems:   Altered mental status   Acute respiratory failure with hypoxia   Allergies: Allergies  Allergen Reactions  . Pork-Derived Products     Patient is Muslim and has requested no pork products    Patient Measurements: Height: 5\' 8"  (172.7 cm) Weight: 194 lb 14.2 oz (88.4 kg) IBW/kg (Calculated) : 68.4 Dosing Weight:  88.4 kg  Vital Signs: BP 126/70  Pulse 96  Temp(Src) 101.5 F (38.6 C) (Oral)  Resp 22  Ht 5\' 8"  (1.727 m)  Wt 194 lb 14.2 oz (88.4 kg)  BMI 29.64 kg/m2  SpO2 100%  Labs:  Recent Labs  10/11/12 0531 10/12/12 0650 10/13/12 0410  WBC 7.6 8.0 7.2  HGB 12.6* 13.4 13.3  PLT 92* 95* 98*  CREATININE 1.05 0.99 1.14   Estimated Creatinine Clearance: 83.8 ml/min (by C-G formula based on Cr of 1.14).   Microbiology: Recent Results (from the past 720 hour(s))  MRSA PCR SCREENING     Status: None   Collection Time    10/09/12 12:41 AM      Result Value Range Status   MRSA by PCR NEGATIVE  NEGATIVE Final  CLOSTRIDIUM DIFFICILE BY PCR     Status: None   Collection Time    10/12/12  5:19 PM      Result Value Range Status   C difficile by pcr NEGATIVE  NEGATIVE Final  GRAM STAIN     Status: None   Collection Time    10/13/12  1:28 PM      Result Value Range Status   Specimen Description CSF   Final   Special Requests NONE   Final   Gram Stain     Final   Value: DIRECT SMEAR     ABUNDANT WBC PRESENT,BOTH PMN AND MONONUCLEAR     NO ORGANISMS SEEN   Report Status 10/13/2012 FINAL   Final    Medical/Surgical History: Past Medical History  Diagnosis Date  . Headache    Past Surgical History  Procedure Laterality Date  . Radiology with anesthesia N/A 10/09/2012    Procedure: RADIOLOGY WITH ANESTHESIA;   Surgeon: Lisbeth Renshaw, MD;  Location: [facility] OR;  Service: Radiology;  Laterality: N/A;    Medications:  Scheduled:  . levETIRAcetam  500 mg Oral BID  . NiMODipine  60 mg Oral Q4H  . pantoprazole  40 mg Oral QHS  . senna-docusate  1 tablet Oral BID  . simvastatin  80 mg Oral q1800   Infusions:  . sodium chloride 100 mL/hr at 10/13/12 1158   Anti-infectives   Start     Dose/Rate Route Frequency Ordered Stop   10/09/12 1445  ceFAZolin (ANCEF) IVPB 2 g/50 mL premix     2 g 100 mL/hr over 30 Minutes Intravenous  Once 10/09/12 1437 10/09/12 1430      Assessment:  52 y/o male admitted via ED 9/18 with SAH. S/p coiling and R-frontal ventriculostomy placement   Patient has an oral temperature of 102.5, CSF WBC count of 562,  and CSF protein count of 46  Vancomycin to begin to cover for possible meningitis.  Patient also has possible cellulitis at an old IV site.  Goal of Therapy:   Vancomycin trough level 15-20 mcg/ml  Plan:   Vancomycin 1 gm IV  q 8 hours. Follow up SCr, UOP, cultures, clinical course and adjust as clinically indicated. Vancomycin trough at steady-state as indicated.  Enrique Manganaro, Elisha Headland,  Pharm.D.,    9/23/20145:34 PM

## 2012-10-13 NOTE — Progress Notes (Signed)
CRITICAL VALUE ALERT  Critical value received:  CSF WBC Date of notification:  10/13/2012  Time of notification:  15:00  Critical value read back:15:00  Nurse who received alert:  Consuella Lose   MD notified (1st page):  15:15  Time of first page:  15:15 MD notified (2nd page):  Time of second page:  Responding MD:  Conchita Paris  Time MD responded:  15:30

## 2012-10-13 NOTE — Progress Notes (Signed)
eLink Physician-Brief Progress Note Patient Name: Richard Davenport DOB: April 11, 1960 MRN: [id number]  Date of Service  10/13/2012   HPI/Events of Note   Notified by Consuella Lose, RN, that pt has erythematous indurated area in L antecubital area at apparent site of an old IV. Some serous fluid but no frank pus expressed. He was febrile earlier to 102.5. CSF cx is pending, 562k WBC. Not currently on abx   eICU Interventions  Will start vanco to address skin lesion, have bedside MD eval to assess for anything that may need to be drained. Abx may need to be adjusted depending on CSF cx and Dr Val Riles assessment.    Intervention Category Intermediate Interventions: Infection - evaluation and management  Mavric Cortright S. 10/13/2012, 5:06 PM

## 2012-10-14 LAB — OSMOLALITY, URINE: Osmolality, Ur: 715 mOsm/kg (ref 390–1090)

## 2012-10-14 LAB — CBC
HCT: 40.7 % (ref 39.0–52.0)
Hemoglobin: 14.9 g/dL (ref 13.0–17.0)
MCH: 34.4 pg — ABNORMAL HIGH (ref 26.0–34.0)
MCV: 94 fL (ref 78.0–100.0)
RBC: 4.33 MIL/uL (ref 4.22–5.81)
RDW: 12.3 % (ref 11.5–15.5)
WBC: 9.3 10*3/uL (ref 4.0–10.5)

## 2012-10-14 LAB — BASIC METABOLIC PANEL
CO2: 15 mEq/L — ABNORMAL LOW (ref 19–32)
Calcium: 8.5 mg/dL (ref 8.4–10.5)
Chloride: 107 mEq/L (ref 96–112)
Creatinine, Ser: 0.95 mg/dL (ref 0.50–1.35)
GFR calc Af Amer: >90 mL/min (ref >90)
GFR calc non Af Amer: >90 mL/min (ref >90)
Glucose, Bld: 184 mg/dL — ABNORMAL HIGH (ref 70–99)
Potassium: 4 mEq/L (ref 3.5–5.1)
Sodium: 130 mEq/L — ABNORMAL LOW (ref 135–145)
Sodium: 136 mEq/L (ref 135–145)

## 2012-10-14 LAB — GLUCOSE, CAPILLARY
Glucose-Capillary: 131 mg/dL — ABNORMAL HIGH (ref 70–99)
Glucose-Capillary: 147 mg/dL — ABNORMAL HIGH (ref 70–99)
Glucose-Capillary: 131 mg/dL — ABNORMAL HIGH (ref 70–99)

## 2012-10-14 LAB — SODIUM
Sodium: 134 mEq/L — ABNORMAL LOW (ref 135–145)
Sodium: 135 mEq/L (ref 135–145)

## 2012-10-14 LAB — SODIUM, URINE, RANDOM: Sodium, Ur: 115 mEq/L

## 2012-10-14 LAB — URIC ACID: Uric Acid, Serum: 3.8 mg/dL — ABNORMAL LOW (ref 4.0–7.8)

## 2012-10-14 MED ORDER — DEXTROSE 5 % IV SOLN
2.0000 g | Freq: Three times a day (TID) | INTRAVENOUS | Status: DC
  Administered 2012-10-14 – 2012-10-16 (×6): 2 g via INTRAVENOUS
  Filled 2012-10-14 (×8): qty 2

## 2012-10-14 MED ORDER — INSULIN ASPART 100 UNIT/ML ~~LOC~~ SOLN
0.0000 [IU] | Freq: Three times a day (TID) | SUBCUTANEOUS | Status: DC
  Administered 2012-10-14 – 2012-10-20 (×3): 1 [IU] via SUBCUTANEOUS
  Administered 2012-10-22: 3 [IU] via SUBCUTANEOUS
  Administered 2012-10-23 – 2012-10-25 (×4): 1 [IU] via SUBCUTANEOUS
  Administered 2012-10-26: 2 [IU] via SUBCUTANEOUS
  Administered 2012-10-26 – 2012-10-27 (×3): 1 [IU] via SUBCUTANEOUS

## 2012-10-14 MED ORDER — DEXTROSE 5 % IV SOLN
2.0000 g | Freq: Two times a day (BID) | INTRAVENOUS | Status: DC
  Administered 2012-10-14: 2 g via INTRAVENOUS
  Filled 2012-10-14 (×2): qty 2

## 2012-10-14 NOTE — Progress Notes (Signed)
Pt seen and examined. Pt was OOB last pm and disconnected EVD. Drain was reconnected and good flow was observed. No other issues overnight. No current c/o x HA.  EXAM: Temp:  [98.8 F (37.1 C)-102.5 F (39.2 C)] 99.8 F (37.7 C) (09/24 0800) Pulse Rate:  [75-107] 76 (09/24 0900) Resp:  [16-25] 20 (09/24 0600) BP: (106-153)/(58-102) 153/83 mmHg (09/24 0900) SpO2:  [97 %-100 %] 98 % (09/24 0900) Intake/Output     09/23 0701 - 09/24 0700 09/24 0701 - 09/25 0700   P.O. 120    I.V. (mL/kg) 2400 (27.1) 200 (2.3)   IV Piggyback 450 50   Total Intake(mL/kg) 2970 (33.6) 250 (2.8)   Urine (mL/kg/hr) 400 (0.2)    Drains 173 (0.1) 20 (0.1)   Total Output 573 20   Net +2397 +230        Urine Occurrence 3 x    Stool Occurrence 1 x     EVD in place, bloody CSF. 173cc x 24hrs Awake, alert, oriented CN grossly intact Follows commands BUE/BLE  LABS: Lab Results  Component Value Date   CREATININE 0.95 10/14/2012   BUN 17 10/14/2012   NA 130* 10/14/2012   K 4.0 10/14/2012   CL 102 10/14/2012   CO2 15* 10/14/2012   Lab Results  Component Value Date   WBC 9.3 10/14/2012   HGB 14.9 10/14/2012   HCT 40.7 10/14/2012   MCV 94.0 10/14/2012   PLT 135* 10/14/2012    IMAGING: No new imaging overnight  IMPRESSION: - 52 y.o. 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 Will start 2% NaCl - EVD open at 10. - Cx negative to date. If remain neg, will d/c current empiric abx

## 2012-10-14 NOTE — Progress Notes (Signed)
ANTIBIOTIC CONSULT NOTE - INITIAL  Pharmacy Consult for ceftazidime Indication: r/o meningitis, r/o thrombophlebitis  Allergies  Allergen Reactions  . Pork-Derived Products     Patient is Muslim and has requested no pork products    Patient Measurements: Height: 5\' 8"  (172.7 cm) Weight: 194 lb 14.2 oz (88.4 kg) IBW/kg (Calculated) : 68.4  Vital Signs: Temp: 99.8 F (37.7 C) (09/24 0800) Temp src: Oral (09/24 0800) BP: 153/83 mmHg (09/24 0900) Pulse Rate: 76 (09/24 0900) Intake/Output from previous day: 09/23 0701 - 09/24 0700 In: 2970 [P.O.:120; I.V.:2400; IV Piggyback:450] Out: 573 [Urine:400; Drains:173] Intake/Output from this shift: Total I/O In: 250 [I.V.:200; IV Piggyback:50] Out: 20 [Drains:20]  Labs:  Recent Labs  10/12/12 0650 10/13/12 0410 10/14/12 0435  WBC 8.0 7.2 9.3  HGB 13.4 13.3 14.9  PLT 95* 98* 135*  CREATININE 0.99 1.14 0.95   Estimated Creatinine Clearance: 100.5 ml/min (by C-G formula based on Cr of 0.95). No results found for this basename: VANCOTROUGH, Leodis Binet, VANCORANDOM, GENTTROUGH, GENTPEAK, GENTRANDOM, TOBRATROUGH, TOBRAPEAK, TOBRARND, AMIKACINPEAK, AMIKACINTROU, AMIKACIN,  in the last 72 hours   Microbiology: Recent Results (from the past 720 hour(s))  MRSA PCR SCREENING     Status: None   Collection Time    10/09/12 12:41 AM      Result Value Range Status   MRSA by PCR NEGATIVE  NEGATIVE Final   Comment:            The GeneXpert MRSA Assay (FDA     approved for NASAL specimens     only), is one component of a     comprehensive MRSA colonization     surveillance program. It is not     intended to diagnose MRSA     infection nor to guide or     monitor treatment for     MRSA infections.  CLOSTRIDIUM DIFFICILE BY PCR     Status: None   Collection Time    10/12/12  5:19 PM      Result Value Range Status   C difficile by pcr NEGATIVE  NEGATIVE Final  CSF CULTURE     Status: None   Collection Time    10/13/12  1:28 PM     Result Value Range Status   Specimen Description CSF   Final   Special Requests NONE   Final   Gram Stain     Final   Value: WBC PRESENT,BOTH PMN AND MONONUCLEAR     NO ORGANISMS SEEN     Performed at [facility]     Performed at [facility]   Culture PENDING   Incomplete   Report Status PENDING   Incomplete  GRAM STAIN     Status: None   Collection Time    10/13/12  1:28 PM      Result Value Range Status   Specimen Description CSF   Final   Special Requests NONE   Final   Gram Stain     Final   Value: DIRECT SMEAR     ABUNDANT WBC PRESENT,BOTH PMN AND MONONUCLEAR     NO ORGANISMS SEEN   Report Status 10/13/2012 FINAL   Final  CULTURE, BLOOD (ROUTINE X 2)     Status: None   Collection Time    10/13/12  2:46 PM      Result Value Range Status   Specimen Description BLOOD RIGHT HAND   Final   Special Requests BOTTLES DRAWN AEROBIC ONLY 5CC   Final  Culture  Setup Time     Final   Value: 10/13/2012 21:02     Performed at [facility]   Culture     Final   Value:        BLOOD CULTURE RECEIVED NO GROWTH TO DATE CULTURE WILL BE HELD FOR 5 DAYS BEFORE ISSUING A FINAL NEGATIVE REPORT     Performed at [facility]   Report Status PENDING   Incomplete  CULTURE, BLOOD (ROUTINE X 2)     Status: None   Collection Time    10/13/12  2:52 PM      Result Value Range Status   Specimen Description BLOOD LEFT UPPER ARM   Final   Special Requests BOTTLES DRAWN AEROBIC ONLY 5CC   Final   Culture  Setup Time     Final   Value: 10/13/2012 21:02     Performed at [facility]   Culture     Final   Value:        BLOOD CULTURE RECEIVED NO GROWTH TO DATE CULTURE WILL BE HELD FOR 5 DAYS BEFORE ISSUING A FINAL NEGATIVE REPORT     Performed at [facility]   Report Status PENDING   Incomplete    Medical History: Past Medical History  Diagnosis Date  . Headache     Medications:  Scheduled:  . levETIRAcetam  500 mg Oral BID  . NiMODipine  60  mg Oral Q4H  . pantoprazole  40 mg Oral QHS  . senna-docusate  1 tablet Oral BID  . simvastatin  80 mg Oral q1800  . vancomycin  1,000 mg Intravenous Q8H   Assessment: 52 yo male known to pharmacy from vancomycin dosing. He was admitted with [facility] and is s/pbasilar aneurysm coiling with R ventriculostomy drain. Pharmacy consulted to add ceftazidime for r/o meningitis and thrombophlebitis. He is afebrile today, WBC are normal, and cultures show no growth thus far.  Goal of Therapy:  Eradication of infection  Plan:  -Ceftazidime 2 g IV q8h -Monitor renal function, clinical course, and culture data  [facility], Pharm.D., BCPS Clinical Pharmacist Pager: [phone] 10/14/2012 11:08 AM

## 2012-10-14 NOTE — Progress Notes (Signed)
PULMONARY  / CRITICAL CARE MEDICINE  Name: Richard Davenport MRN: [id number] DOB: 01-14-1961    ADMISSION DATE:  10/08/2012 CONSULTATION DATE:  10/09/2012  REFERRING MD :  Dr. Conchita Paris PRIMARY SERVICE: Neurosurgery  CHIEF COMPLAINT:  SAH  BRIEF PATIENT DESCRIPTION:  Admitted via ED 9/18 with SAH. Underwent coiling. Self extubated 9/20  SIGNIFICANT EVENTS / STUDIES:  9/18 CT head: SAH with ventriculomegaly 9/18: Diagnostic Angiogram. Coiling of basilar aneurysm. Placement of External ventricular drain 9/19 Right frontal ventriculostomy placement 9/19 CT head: No significant interval change in extensive subarachnoid hemorrhage within the basilar cisterns 9/23- fever, abx started  LINES / TUBES: ETT 9/19 >> 9/20 (self extubated)  CULTURES: 9/23 blood>>> 9/23 CSF>>>  ANTIBIOTICS: vanc 9/23>>> cefraixone 9/23>>>  SUBJECTIVE/OVERNIGHT/INTERVAL HX abx started  PHYSICAL EXAM  VITAL SIGNS: Temp:  [98.8 F (37.1 C)-102.5 F (39.2 C)] 99.8 F (37.7 C) (09/24 0800) Pulse Rate:  [75-107] 76 (09/24 0900) Resp:  [16-25] 20 (09/24 0600) BP: (106-153)/(58-102) 153/83 mmHg (09/24 0900) SpO2:  [97 %-100 %] 98 % (09/24 0900)  HEMODYNAMICS:   VENTILATOR SETTINGS:   INTAKE / OUTPUT: Intake/Output     09/23 0701 - 09/24 0700 09/24 0701 - 09/25 0700   P.O. 120    I.V. (mL/kg) 2400 (27.1) 200 (2.3)   IV Piggyback 450 50   Total Intake(mL/kg) 2970 (33.6) 250 (2.8)   Urine (mL/kg/hr) 400 (0.2)    Drains 173 (0.1) 20 (0.1)   Total Output 573 20   Net +2397 +230        Urine Occurrence 3 x    Stool Occurrence 1 x     PHYSICAL EXAMINATION: General: No distress Neuro:  RASS +1. Moves all 4s, follows commands HEENT:  Ventric drain in place no erythema, pus Cardiovascular:  RRR s M Lungs: Clear Abdomen: Soft, NT, NAB Ext: no edema, left brachial, small nodule, not red, not painful, no dc  LABS:  PULMONARY  Recent Labs Lab 10/08/12 2144 10/09/12 1935 10/09/12 2256  PHART  --   7.353 7.386  PCO2ART  --  39.8 37.6  PO2ART  --  163.0* 98.0  HCO3  --  22.1 22.6  TCO2 23 23 24   O2SAT  --  99.0 98.0   CBC  Recent Labs Lab 10/12/12 0650 10/13/12 0410 10/14/12 0435  HGB 13.4 13.3 14.9  HCT 38.3* 36.2* 40.7  WBC 8.0 7.2 9.3  PLT 95* 98* 135*   COAGULATION  Recent Labs Lab 10/08/12 2131  INR 1.16   CARDIAC    Recent Labs Lab 10/08/12 2131  TROPONINI <0.30   No results found for this basename: PROBNP,  in the last 168 hours  CHEMISTRY  Recent Labs Lab 10/08/12 2131 10/08/12 2144 10/11/12 0531 10/12/12 0650 10/13/12 0410 10/14/12 0435  NA 142 144 141 136 136 130*  K 3.8 3.8 3.0* 3.2* 3.5 4.0  CL 104 107 109 106 105 102  CO2 26  --  21 19 19  15*  GLUCOSE 129* 125* 117* 121* 111* 184*  BUN 16 16 12 12 13 17   CREATININE 1.04 1.00 1.05 0.99 1.14 0.95  CALCIUM 9.4  --  8.1* 8.4 8.0* 8.5  MG  --   --  2.0  --  1.8 2.1  PHOS  --   --  2.8  --  2.7 3.4   Estimated Creatinine Clearance: 100.5 ml/min (by C-G formula based on Cr of 0.95).  LIVER  Recent Labs Lab 10/08/12 2131  AST 22  ALT 19  ALKPHOS 91  BILITOT 0.5  PROT 8.1  ALBUMIN 4.0  INR 1.16   INFECTIOUS No results found for this basename: LATICACIDVEN, PROCALCITON,  in the last 168 hours  ENDOCRINE CBG (last 3)  No results found for this basename: GLUCAP,  in the last 72 hours  CXR: NNF  ASSESSMENT / PLAN: Principal Problem:   SAH (subarachnoid hemorrhage) Active Problems:   Altered mental status   Acute respiratory failure with hypoxia  PULMONARY A: Resp failure, resolved P: - pcxr in am for atx - IS per RT.  CARDIOVASCULAR A:  No acute issues P: - BP control as ordered.  RENAL A: Hyponatremia in setting of SAH R/o SIADH vs cSW vs iatrogenic P: - bmet in afternoon -send serum osm, urine na, osm -assess bmet in 1800 and uric acid level -may need cvp guidance -of course want to avoid hyponatremia, need to determine etiology: may in fact need  redcution saline, keeping pos balance -no free water  GASTROINTESTINAL A: No issues P: - Continue diet.  HEMATOLOGIC A:   Thrombocytopenia, no acute bleeding P: - Monitor CBC intermittently  INFECTIOUS A: Low grade fever overnight but WBC remains stable (100.8). R/o csf, r/o septic thrombophlebitits P: - Continue vanc -change ceftriaxone to ceftaz ( nosocomial risk) May need doppler arm  ENDOCRINE A:   Mild hyperglycemia without prior hx of DM P:   - ssi addition  NEUROLOGIC A:  SAH  P:   - Mgmt per NS -tcd -see renal -clinically appears well -nimpodione -may need floronef, follow na closely -urine output NOT c/w CSW at this stage but may not be accurate  TODAY'S SUMMARY: work up Na, continue abx, may need ID of brachial region  Mcarthur Rossetti. Tyson Alias, MD, FACP Pgr: [phone] [facility]

## 2012-10-14 NOTE — Progress Notes (Signed)
Transcranial Doppler  Date POD PCO2 HCT BP  MCA ACA PCA OPHT SIPH VERT Basilar  10/12/12 MS     Right  Left   96  28   -34  -24   -24  -37   34  27   -31  29   -46  -25     -48    9-24- 14 SB     Right  Left   141  117   -35  -45   23  --   44  33   34  37   -29  -39     -69         Right  Left                                             Right  Left                                             Right  Left                                            Right  Left                                            Right  Left                                        MCA = Middle Cerebral Artery      OPHT = Opthalmic Artery     BASILAR = Basilar Artery   ACA = Anterior Cerebral Artery     SIPH = Carotid Siphon PCA = Posterior Cerebral Artery   VERT = Verterbral Artery                   Normal MCA = 62+\-12 ACA = 50+\-12 PCA = 42+\-23     10/14/2012 3:09 PM Gertie Fey, RVT, RDCS, RDMS

## 2012-10-14 NOTE — Progress Notes (Signed)
Pt. Disconnected IVC catheter approximately 9 inches from insertion site. Notified Dr. Venetia Maxon of situation. Reconnected catheter. Pt alert and oriented X 4. No change in neuro status. IVC functioning properly. Dr. Conchita Paris notified of incident as well. No new orders given at this time. Will continue to monitor closely.

## 2012-10-14 NOTE — Progress Notes (Signed)
MD notified that pt is leaking moderate amt of clear fluid onto pillow, I assume from ventriculostomy. MD Conchita Paris also confirmed that he does NOT want 3%NA started at this time. (There is a signed and held order for this).

## 2012-10-15 ENCOUNTER — Inpatient Hospital Stay (HOSPITAL_COMMUNITY): Payer: Medicaid Other

## 2012-10-15 DIAGNOSIS — I82619 Acute embolism and thrombosis of superficial veins of unspecified upper extremity: Secondary | ICD-10-CM

## 2012-10-15 DIAGNOSIS — I619 Nontraumatic intracerebral hemorrhage, unspecified: Secondary | ICD-10-CM

## 2012-10-15 LAB — GLUCOSE, CAPILLARY
Glucose-Capillary: 138 mg/dL — ABNORMAL HIGH (ref 70–99)
Glucose-Capillary: 106 mg/dL — ABNORMAL HIGH (ref 70–99)

## 2012-10-15 LAB — BASIC METABOLIC PANEL
BUN: 22 mg/dL (ref 6–23)
CO2: 16 mEq/L — ABNORMAL LOW (ref 19–32)
Chloride: 107 mEq/L (ref 96–112)
Creatinine, Ser: 0.97 mg/dL (ref 0.50–1.35)
GFR calc non Af Amer: >90 mL/min (ref >90)
Glucose, Bld: 131 mg/dL — ABNORMAL HIGH (ref 70–99)
Sodium: 137 mEq/L (ref 135–145)

## 2012-10-15 LAB — SODIUM: Sodium: 131 mEq/L — ABNORMAL LOW (ref 135–145)

## 2012-10-15 MED ORDER — NIMODIPINE 30 MG PO CAPS
60.0000 mg | ORAL_CAPSULE | ORAL | Status: DC
  Administered 2012-10-15 – 2012-10-27 (×73): 60 mg via ORAL
  Filled 2012-10-15 (×78): qty 2

## 2012-10-15 MED ORDER — SODIUM CHLORIDE 0.9 % IV SOLN
500.0000 mg | Freq: Two times a day (BID) | INTRAVENOUS | Status: DC
  Administered 2012-10-15 – 2012-10-17 (×5): 500 mg via INTRAVENOUS
  Filled 2012-10-15 (×6): qty 5

## 2012-10-15 MED ORDER — SODIUM CHLORIDE 0.9 % IV SOLN: INTRAVENOUS | Status: DC

## 2012-10-15 MED ORDER — ALUM & MAG HYDROXIDE-SIMETH 200-200-20 MG/5ML PO SUSP
30.0000 mL | Freq: Two times a day (BID) | ORAL | Status: DC | PRN
  Administered 2012-10-15 – 2012-10-16 (×2): 30 mL via ORAL
  Filled 2012-10-15 (×2): qty 30

## 2012-10-15 MED ORDER — ONDANSETRON HCL 4 MG/2ML IJ SOLN
4.0000 mg | Freq: Four times a day (QID) | INTRAMUSCULAR | Status: DC | PRN
  Administered 2012-10-15 – 2012-10-16 (×5): 4 mg via INTRAVENOUS
  Filled 2012-10-15 (×5): qty 2

## 2012-10-15 NOTE — Progress Notes (Signed)
UR completed.  Miyana Mordecai, RN BSN [facility] CCM Trauma/Neuro ICU Case Manager [phone]  

## 2012-10-15 NOTE — Progress Notes (Signed)
Spoke with Dr. Conchita Paris about patients vomiting x2. Received orders for 4mg  Zofran Q6 hours PRN. Will continue to monitor patient.

## 2012-10-15 NOTE — Progress Notes (Signed)
PULMONARY  / CRITICAL CARE MEDICINE  Name: Izekiel Flegel MRN: [id number] DOB: November 28, 1960    ADMISSION DATE:  10/08/2012 CONSULTATION DATE:  10/09/2012  REFERRING MD :  Dr. Conchita Paris PRIMARY SERVICE: Neurosurgery  CHIEF COMPLAINT:  SAH  BRIEF PATIENT DESCRIPTION:  Admitted via ED 9/18 with SAH. Underwent coiling. Self extubated 9/20  SIGNIFICANT EVENTS / STUDIES:  9/18 CT head: SAH with ventriculomegaly 9/18: Diagnostic Angiogram. Coiling of basilar aneurysm. Placement of External ventricular drain 9/19 Right frontal ventriculostomy placement 9/19 CT head: No significant interval change in extensive subarachnoid hemorrhage within the basilar cisterns 9/23- fever, abx started  LINES / TUBES: ETT 9/19 >> 9/20 (self extubated)  CULTURES: 9/23 blood>>> 9/23 CSF>>>  ANTIBIOTICS: vanc 9/23>>> cefraixone 9/23>>>  SUBJECTIVE/OVERNIGHT/INTERVAL HX abx started  PHYSICAL EXAM  VITAL SIGNS: Temp:  [98.3 F (36.8 C)-103.1 F (39.5 C)] 101.1 F (38.4 C) (09/25 0800) Pulse Rate:  [94-112] 112 (09/25 0900) Resp:  [18-27] 27 (09/25 0900) BP: (116-144)/(70-89) 132/84 mmHg (09/25 0900) SpO2:  [94 %-100 %] 99 % (09/25 0900)  HEMODYNAMICS:   VENTILATOR SETTINGS:   INTAKE / OUTPUT: Intake/Output     09/24 0701 - 09/25 0700 09/25 0701 - 09/26 0700   P.O. 480 250   I.V. (mL/kg) 1650 (18.7) 200 (2.3)   IV Piggyback 800    Total Intake(mL/kg) 2930 (33.1) 450 (5.1)   Urine (mL/kg/hr) 950 (0.4) 350 (1.3)   Emesis/NG output 2 (0)    Drains 157 (0.1) 20 (0.1)   Total Output 1109 370   Net +1821 +80        Urine Occurrence 1 x     PHYSICAL EXAMINATION: General: No distress Neuro:  RASS +1. Moves all 4s, follows commands HEENT:  Ventric drain in place no erythema, pus Cardiovascular:  RRR s M Lungs: Clear Abdomen: Soft, NT, NAB Ext: no edema, left brachial, small nodule, not red, not painful, no dc  LABS:  PULMONARY  Recent Labs Lab 10/08/12 2144 10/09/12 1935  10/09/12 2256  PHART  --  7.353 7.386  PCO2ART  --  39.8 37.6  PO2ART  --  163.0* 98.0  HCO3  --  22.1 22.6  TCO2 23 23 24   O2SAT  --  99.0 98.0   CBC  Recent Labs Lab 10/12/12 0650 10/13/12 0410 10/14/12 0435  HGB 13.4 13.3 14.9  HCT 38.3* 36.2* 40.7  WBC 8.0 7.2 9.3  PLT 95* 98* 135*   COAGULATION  Recent Labs Lab 10/08/12 2131  INR 1.16   CARDIAC    Recent Labs Lab 10/08/12 2131  TROPONINI <0.30   No results found for this basename: PROBNP,  in the last 168 hours  CHEMISTRY  Recent Labs Lab 10/08/12 2144 10/11/12 0531 10/12/12 0650 10/13/12 0410 10/14/12 0435 10/14/12 1051 10/14/12 1730 10/14/12 2228 10/15/12 0548  NA 144 141 136 136 130* 134* 136 135 137  K 3.8 3.0* 3.2* 3.5 4.0  --  4.0  --  4.0  CL 107 109 106 105 102  --  107  --  107  CO2  --  21 19 19  15*  --  15*  --  16*  GLUCOSE 125* 117* 121* 111* 184*  --  153*  --  131*  BUN 16 12 12 13 17   --  20  --  22  CREATININE 1.00 1.05 0.99 1.14 0.95  --  0.95  --  0.97  CALCIUM  --  8.1* 8.4 8.0* 8.5  --  8.2*  --  8.2*  MG  --  2.0  --  1.8 2.1  --   --   --   --   PHOS  --  2.8  --  2.7 3.4  --   --   --   --    Estimated Creatinine Clearance: 98.5 ml/min (by C-G formula based on Cr of 0.97).  LIVER  Recent Labs Lab 10/08/12 2131  AST 22  ALT 19  ALKPHOS 91  BILITOT 0.5  PROT 8.1  ALBUMIN 4.0  INR 1.16   INFECTIOUS No results found for this basename: LATICACIDVEN, PROCALCITON,  in the last 168 hours  ENDOCRINE CBG (last 3)   Recent Labs  10/14/12 1722 10/14/12 2153 10/15/12 0807  GLUCAP 147* 131* 105*    CXR: NNF  ASSESSMENT / PLAN: Principal Problem:   SAH (subarachnoid hemorrhage) Active Problems:   Altered mental status   Acute respiratory failure with hypoxia  PULMONARY A: Resp failure, resolved P: - IS per RT.  CARDIOVASCULAR A:  No acute issues P: - BP control as ordered.  RENAL A: Hyponatremia in setting of SAH R/o SIADH vs cSW vs  iatrogenic P: - BMET in AM. - Urine Na 115, Urine osm 715, unlikely SIADH or CSW. - Will give a liter of NS to avoid hyponatremia given neuro status. - No free water  GASTROINTESTINAL A: No issues P: - Continue diet.  HEMATOLOGIC A:   Thrombocytopenia, no acute bleeding P: - Monitor CBC intermittently  INFECTIOUS A: Low grade fever overnight but WBC remains stable (100.8). R/o csf, r/o septic thrombophlebitits P: - Continue vanc - Changed ceftriaxone to ceftaz ( nosocomial risk) - May need doppler arm - Recommend asking ID to see.  ENDOCRINE A:   Mild hyperglycemia without prior hx of DM P:   - ISS and CBG.  NEUROLOGIC A:  SAH  P:   - Mgmt per NS - See renal - Clinically appears well - Nimpodine. - May need floronef, follow na closely  Alyson Reedy, M.D. [facility] Pulmonary/Critical Care Medicine. Pager: [phone]. After hours pager: [phone].

## 2012-10-15 NOTE — Progress Notes (Signed)
Pt vomited total of 4 separate times beginning at 9:35. First two times unwitnessed by RN. Pt was given Zofran 4 mg IV at 09:41 after first episode of vomiting. Neuro assessment unchanged, pt has been confused at times but mostly oriented to person (not age), hospital (sometimes says [place] vs. [place]), year, and situation. Pt tachy 110s to 120 this AM with temp spike of 101.1. Pt also reported heartburn and requested milk which was drank very quickly prior to start of vomiting. Dr. Conchita Paris made aware of the above. Keppra to be changed to IV at this time and Maalox ordered prn. No other orders received. Will continue to monitor.   Holly Bodily

## 2012-10-15 NOTE — Consult Note (Signed)
INFECTIOUS DISEASE CONSULT NOTE  Date of Admission:  10/08/2012  Date of Consult:  10/15/2012  Reason for Consult:Fever Referring Physician: Yacoub  Impression/Recommendation Fever CNS bleed  Would  Check C diff Check u/s of LUE for clot Check HIV per CDC guidelines No change in anbx for now  Comment- Suspect his fever is central. Subarachnoid bleeds are well known for this. He does not look toxic. His LUE is concerning for thrombosis which could also cause fever.   Thank you so much for this interesting consult,   Johny Sax (pager) [phone] [email or web address]  Richard Davenport is an 52 y.o. male.  HPI: 52 yo M with acute onset of headache on 9-18. He was given pain medication at urgent care then went home. He was then found unresponsive in the bathroom. A CT scan then showed diffuse subarachnoid hemorrhage. 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 He was intubated then and required ventilation post-operatively. By 9-20 he self-extubated. He developed fever on 9-21.  By 9-23 his WBC had normalized but he continued to have temps up to 102.5. He was noted to cellulitis at L antecubital site and started on vanco/ceftriaxone at that time, then changed to vanco/ceftaz.  Today temp up to 103.1. DVT studies (-).  CSF (9-23): Cx ngtd, RBC 54,500, WBC 562.   Past Medical History  Diagnosis Date  . Headache     Past Surgical History  Procedure Laterality Date  . Radiology with anesthesia N/A 10/09/2012    Procedure: RADIOLOGY WITH ANESTHESIA;  Surgeon: Lisbeth Renshaw, MD;  Location: [facility] OR;  Service: Radiology;  Laterality: N/A;     Allergies  Allergen Reactions  . Pork-Derived Products     Patient is Muslim and has requested no pork products    Medications:  Scheduled: . cefTAZidime (FORTAZ)  IV  2 g Intravenous Q8H  . insulin aspart  0-9 Units Subcutaneous TID WC  . levETIRAcetam  500 mg Intravenous BID  . NiMODipine  60 mg Oral Q4H  .  pantoprazole  40 mg Oral QHS  . senna-docusate  1 tablet Oral BID  . simvastatin  80 mg Oral q1800  . vancomycin  1,000 mg Intravenous Q8H    Total days of antibiotics: 2 (vanco/ceftaz)          Social History:  reports that he has never smoked. He has never used smokeless tobacco. He reports that he does not drink alcohol or use illicit drugs.  History reviewed. No pertinent family history.  General ROS: no headache, loose BM each of last 2 nights, no sob or cough, see HPI.   Blood pressure 140/85, pulse 111, temperature 101.1 F (38.4 C), temperature source Oral, resp. rate 32, height 5\' 8"  (1.727 m), weight 88.4 kg (194 lb 14.2 oz), SpO2 96.00%. General appearance: alert, cooperative and no distress Head: drain on R crown, non-tender.  Eyes: negative findings: pupils equal, round, reactive to light and accomodation Throat: normal findings: oropharynx pink & moist without lesions or evidence of thrush Neck: no adenopathy and supple, symmetrical, trachea midline Lungs: clear to auscultation bilaterally Heart: regular rate and rhythm Abdomen: normal findings: bowel sounds normal and soft, non-tender and abnormal findings:  distended Extremities: no edema, no tenderness in B calves, + cordis in LUE at [facility]. no fluctuance.    Results for orders placed during the hospital encounter of 10/08/12 (from the past 48 hour(s))  PROTEIN AND GLUCOSE, CSF     Status: Abnormal   Collection Time  10/13/12  1:28 PM      Result Value Range   Glucose, CSF 57  43 - 76 mg/dL   Total  Protein, CSF 46 (*) 15 - 45 mg/dL  CSF CELL COUNT WITH DIFFERENTIAL     Status: Abnormal   Collection Time    10/13/12  1:28 PM      Result Value Range   Tube # SAMPLE SUBMITTED IN VACUTAINER TUBE     Color, CSF RED (*) COLORLESS   Appearance, CSF TURBID (*) CLEAR   Supernatant XANTHOCHROMIC     RBC Count, CSF 54500 (*) 0 /cu mm   WBC, CSF 562 (*) 0 - 5 /cu mm   Comment: CRITICAL RESULT CALLED TO, READ BACK BY  AND VERIFIED WITH:     ELAINE THIELEN,RN AT 1503 10/13/12 BY ZBEECH.   Segmented Neutrophils-CSF 95 (*) 0 - 6 %   Lymphs, CSF 2 (*) 40 - 80 %   Monocyte-Macrophage-Spinal Fluid 3 (*) 15 - 45 %   Eosinophils, CSF 0  0 - 1 %  CSF CULTURE     Status: None   Collection Time    10/13/12  1:28 PM      Result Value Range   Specimen Description CSF     Special Requests NONE     Gram Stain       Value: WBC PRESENT,BOTH PMN AND MONONUCLEAR     NO ORGANISMS SEEN     Performed at [facility]     Performed at [facility]   Culture       Value: NO GROWTH 2 DAYS     Performed at [facility]   Report Status PENDING    GRAM STAIN     Status: None   Collection Time    10/13/12  1:28 PM      Result Value Range   Specimen Description CSF     Special Requests NONE     Gram Stain       Value: DIRECT SMEAR     ABUNDANT WBC PRESENT,BOTH PMN AND MONONUCLEAR     NO ORGANISMS SEEN   Report Status 10/13/2012 FINAL    PATHOLOGIST SMEAR REVIEW     Status: None   Collection Time    10/13/12  1:28 PM      Result Value Range   Path Review Blood and leukocytes.     Comment: Reviewed by Beulah Gandy. Luisa Hart, M.D.     10/14/2012  CULTURE, BLOOD (ROUTINE X 2)     Status: None   Collection Time    10/13/12  2:46 PM      Result Value Range   Specimen Description BLOOD RIGHT HAND     Special Requests BOTTLES DRAWN AEROBIC ONLY 5CC     Culture  Setup Time       Value: 10/13/2012 21:02     Performed at [facility]   Culture       Value:        BLOOD CULTURE RECEIVED NO GROWTH TO DATE CULTURE WILL BE HELD FOR 5 DAYS BEFORE ISSUING A FINAL NEGATIVE REPORT     Performed at [facility]   Report Status PENDING    CULTURE, BLOOD (ROUTINE X 2)     Status: None   Collection Time    10/13/12  2:52 PM      Result Value Range   Specimen Description BLOOD LEFT UPPER ARM     Special Requests  BOTTLES DRAWN AEROBIC ONLY 5CC     Culture  Setup Time       Value: 10/13/2012 21:02       Performed at [facility]   Culture       Value:        BLOOD CULTURE RECEIVED NO GROWTH TO DATE CULTURE WILL BE HELD FOR 5 DAYS BEFORE ISSUING A FINAL NEGATIVE REPORT     Performed at [facility]   Report Status PENDING    BASIC METABOLIC PANEL     Status: Abnormal   Collection Time    10/14/12  4:35 AM      Result Value Range   Sodium 130 (*) 135 - 145 mEq/L   Potassium 4.0  3.5 - 5.1 mEq/L   Chloride 102  96 - 112 mEq/L   CO2 15 (*) 19 - 32 mEq/L   Glucose, Bld 184 (*) 70 - 99 mg/dL   BUN 17  6 - 23 mg/dL   Creatinine, Ser 4.09  0.50 - 1.35 mg/dL   Calcium 8.5  8.4 - 81.1 mg/dL   GFR calc non Af Amer >90  >90 mL/min   GFR calc Af Amer >90  >90 mL/min   Comment: (NOTE)     The eGFR has been calculated using the CKD EPI equation.     This calculation has not been validated in all clinical situations.     eGFR's persistently <90 mL/min signify possible Chronic Kidney     Disease.  MAGNESIUM     Status: None   Collection Time    10/14/12  4:35 AM      Result Value Range   Magnesium 2.1  1.5 - 2.5 mg/dL  CBC     Status: Abnormal   Collection Time    10/14/12  4:35 AM      Result Value Range   WBC 9.3  4.0 - 10.5 K/uL   RBC 4.33  4.22 - 5.81 MIL/uL   Hemoglobin 14.9  13.0 - 17.0 g/dL   HCT 91.4  78.2 - 95.6 %   MCV 94.0  78.0 - 100.0 fL   MCH 34.4 (*) 26.0 - 34.0 pg   MCHC 36.6 (*) 30.0 - 36.0 g/dL   RDW 21.3  08.6 - 57.8 %   Platelets 135 (*) 150 - 400 K/uL   Comment: REPEATED TO VERIFY  PHOSPHORUS     Status: None   Collection Time    10/14/12  4:35 AM      Result Value Range   Phosphorus 3.4  2.3 - 4.6 mg/dL  OSMOLALITY     Status: None   Collection Time    10/14/12 10:51 AM      Result Value Range   Osmolality 290  275 - 300 mOsm/kg   Comment: Performed at [facility]  SODIUM     Status: Abnormal   Collection Time    10/14/12 10:51 AM      Result Value Range   Sodium 134 (*) 135 - 145 mEq/L  GLUCOSE, CAPILLARY     Status:  Abnormal   Collection Time    10/14/12 12:09 PM      Result Value Range   Glucose-Capillary 166 (*) 70 - 99 mg/dL  OSMOLALITY, URINE     Status: None   Collection Time    10/14/12 12:35 PM      Result Value Range   Osmolality, Ur 715  390 - 1090 mOsm/kg   Comment:  Performed at [facility]  SODIUM, URINE, RANDOM     Status: None   Collection Time    10/14/12 12:35 PM      Result Value Range   Sodium, Ur 115    GLUCOSE, CAPILLARY     Status: Abnormal   Collection Time    10/14/12  5:18 PM      Result Value Range   Glucose-Capillary 131 (*) 70 - 99 mg/dL   Comment 1 Notify RN     Comment 2 Documented in Chart    GLUCOSE, CAPILLARY     Status: Abnormal   Collection Time    10/14/12  5:22 PM      Result Value Range   Glucose-Capillary 147 (*) 70 - 99 mg/dL  BASIC METABOLIC PANEL     Status: Abnormal   Collection Time    10/14/12  5:30 PM      Result Value Range   Sodium 136  135 - 145 mEq/L   Potassium 4.0  3.5 - 5.1 mEq/L   Chloride 107  96 - 112 mEq/L   CO2 15 (*) 19 - 32 mEq/L   Glucose, Bld 153 (*) 70 - 99 mg/dL   BUN 20  6 - 23 mg/dL   Creatinine, Ser 0.96  0.50 - 1.35 mg/dL   Calcium 8.2 (*) 8.4 - 10.5 mg/dL   GFR calc non Af Amer >90  >90 mL/min   GFR calc Af Amer >90  >90 mL/min   Comment: (NOTE)     The eGFR has been calculated using the CKD EPI equation.     This calculation has not been validated in all clinical situations.     eGFR's persistently <90 mL/min signify possible Chronic Kidney     Disease.  URIC ACID     Status: Abnormal   Collection Time    10/14/12  5:30 PM      Result Value Range   Uric Acid, Serum 3.8 (*) 4.0 - 7.8 mg/dL  GLUCOSE, CAPILLARY     Status: Abnormal   Collection Time    10/14/12  9:53 PM      Result Value Range   Glucose-Capillary 131 (*) 70 - 99 mg/dL   Comment 1 Notify RN     Comment 2 Documented in Chart    SODIUM     Status: None   Collection Time    10/14/12 10:28 PM      Result Value Range   Sodium 135   135 - 145 mEq/L  BASIC METABOLIC PANEL     Status: Abnormal   Collection Time    10/15/12  5:48 AM      Result Value Range   Sodium 137  135 - 145 mEq/L   Potassium 4.0  3.5 - 5.1 mEq/L   Chloride 107  96 - 112 mEq/L   CO2 16 (*) 19 - 32 mEq/L   Glucose, Bld 131 (*) 70 - 99 mg/dL   BUN 22  6 - 23 mg/dL   Creatinine, Ser 0.45  0.50 - 1.35 mg/dL   Calcium 8.2 (*) 8.4 - 10.5 mg/dL   GFR calc non Af Amer >90  >90 mL/min   GFR calc Af Amer >90  >90 mL/min   Comment: (NOTE)     The eGFR has been calculated using the CKD EPI equation.     This calculation has not been validated in all clinical situations.     eGFR's persistently <90 mL/min signify possible Chronic  Kidney     Disease.  GLUCOSE, CAPILLARY     Status: Abnormal   Collection Time    10/15/12  8:07 AM      Result Value Range   Glucose-Capillary 105 (*) 70 - 99 mg/dL      Component Value Date/Time   SDES BLOOD LEFT UPPER ARM 10/13/2012 1452   SPECREQUEST BOTTLES DRAWN AEROBIC ONLY 5CC 10/13/2012 1452   CULT  Value:        BLOOD CULTURE RECEIVED NO GROWTH TO DATE CULTURE WILL BE HELD FOR 5 DAYS BEFORE ISSUING A FINAL NEGATIVE REPORT Performed at [facility] 10/13/2012 1452   REPTSTATUS PENDING 10/13/2012 1452   Dg Chest Port 1 View  10/15/2012   CLINICAL DATA:  Evaluate infiltrates/atelectasis.  EXAM: PORTABLE CHEST - 1 VIEW  COMPARISON:  10/12/2012.  FINDINGS: Cardiomegaly.  Tortuous aorta.  Pulmonary vascular prominence most notable centrally.  No segmental infiltrate or gross pneumothorax.  Gas-filled stomach.  IMPRESSION: No segmental infiltrate or atelectasis.  Cardiomegaly.  Tortuous aorta.  Pulmonary vascular prominence most notable centrally.   Electronically Signed   By: Bridgett Larsson   On: 10/15/2012 07:28   Recent Results (from the past 240 hour(s))  MRSA PCR SCREENING     Status: None   Collection Time    10/09/12 12:41 AM      Result Value Range Status   MRSA by PCR NEGATIVE  NEGATIVE Final   Comment:             The GeneXpert MRSA Assay (FDA     approved for NASAL specimens     only), is one component of a     comprehensive MRSA colonization     surveillance program. It is not     intended to diagnose MRSA     infection nor to guide or     monitor treatment for     MRSA infections.  CLOSTRIDIUM DIFFICILE BY PCR     Status: None   Collection Time    10/12/12  5:19 PM      Result Value Range Status   C difficile by pcr NEGATIVE  NEGATIVE Final  CSF CULTURE     Status: None   Collection Time    10/13/12  1:28 PM      Result Value Range Status   Specimen Description CSF   Final   Special Requests NONE   Final   Gram Stain     Final   Value: WBC PRESENT,BOTH PMN AND MONONUCLEAR     NO ORGANISMS SEEN     Performed at [facility]     Performed at [facility]   Culture     Final   Value: NO GROWTH 2 DAYS     Performed at [facility]   Report Status PENDING   Incomplete  GRAM STAIN     Status: None   Collection Time    10/13/12  1:28 PM      Result Value Range Status   Specimen Description CSF   Final   Special Requests NONE   Final   Gram Stain     Final   Value: DIRECT SMEAR     ABUNDANT WBC PRESENT,BOTH PMN AND MONONUCLEAR     NO ORGANISMS SEEN   Report Status 10/13/2012 FINAL   Final  CULTURE, BLOOD (ROUTINE X 2)     Status: None   Collection Time    10/13/12  2:46 PM  Result Value Range Status   Specimen Description BLOOD RIGHT HAND   Final   Special Requests BOTTLES DRAWN AEROBIC ONLY 5CC   Final   Culture  Setup Time     Final   Value: 10/13/2012 21:02     Performed at [facility]   Culture     Final   Value:        BLOOD CULTURE RECEIVED NO GROWTH TO DATE CULTURE WILL BE HELD FOR 5 DAYS BEFORE ISSUING A FINAL NEGATIVE REPORT     Performed at [facility]   Report Status PENDING   Incomplete  CULTURE, BLOOD (ROUTINE X 2)     Status: None   Collection Time    10/13/12  2:52 PM      Result Value Range Status   Specimen  Description BLOOD LEFT UPPER ARM   Final   Special Requests BOTTLES DRAWN AEROBIC ONLY 5CC   Final   Culture  Setup Time     Final   Value: 10/13/2012 21:02     Performed at [facility]   Culture     Final   Value:        BLOOD CULTURE RECEIVED NO GROWTH TO DATE CULTURE WILL BE HELD FOR 5 DAYS BEFORE ISSUING A FINAL NEGATIVE REPORT     Performed at [facility]   Report Status PENDING   Incomplete      10/15/2012, 11:07 AM     LOS: 7 days

## 2012-10-15 NOTE — Progress Notes (Signed)
*  Preliminary Results* Left upper extremity venous duplex completed. Left upper extremity is positive for superficial vein thrombosis involving the left basilic vein, solely in the antecubital fossa. There is no evidence of deep vein thrombosis involving the left upper extremity.  Preliminary results discussed with Toma Copier, RN.  10/15/2012 12:30 PM  Gertie Fey, RVT, RDCS, RDMS

## 2012-10-15 NOTE — Progress Notes (Signed)
VASCULAR LAB PRELIMINARY  PRELIMINARY  PRELIMINARY  PRELIMINARY  Bilateral lower extremity venous Dopplers completed.    Preliminary report:  There is no DVT or SVT noted in the bilateral lower extremities.   Diego Delancey, RVT 10/15/2012, 10:31 AM

## 2012-10-15 NOTE — Progress Notes (Signed)
KUB obtained, results not available. Spoke with Dr. Phoebe Perch, on call physician. Order received to change Nimotop from solution to capsule since pt's vomiting episodes have been happening after liquid med dose. Will continue to monitor pt and wait for xray results.   Holly Bodily

## 2012-10-15 NOTE — Progress Notes (Signed)
Pt seen and examined.No issues overnight x N/V without significant increase in HA or change in exam.  EXAM: Temp:  [98.3 F (36.8 C)-103.1 F (39.5 C)] 98.5 F (36.9 C) (09/25 0359) Pulse Rate:  [76-112] 106 (09/25 0600) Resp:  [18-27] 22 (09/25 0700) BP: (116-153)/(70-89) 140/85 mmHg (09/25 0700) SpO2:  [94 %-100 %] 99 % (09/25 0700) Intake/Output     09/24 0701 - 09/25 0700 09/25 0701 - 09/26 0700   P.O. 480    I.V. (mL/kg) 1650 (18.7)    IV Piggyback 800    Total Intake(mL/kg) 2930 (33.1)    Urine (mL/kg/hr) 950 (0.4)    Emesis/NG output 2 (0)    Drains 157 (0.1)    Total Output 1109     Net +1821          Urine Occurrence 1 x     EVD in place, bloody CSF. 157cc x 24hrs Awake, alert, oriented CN grossly intact Follows commands BUE/BLE  LABS: Lab Results  Component Value Date   CREATININE 0.97 10/15/2012   BUN 22 10/15/2012   NA 137 10/15/2012   K 4.0 10/15/2012   CL 107 10/15/2012   CO2 16* 10/15/2012   Lab Results  Component Value Date   WBC 9.3 10/14/2012   HGB 14.9 10/14/2012   HCT 40.7 10/14/2012   MCV 94.0 10/14/2012   PLT 135* 10/14/2012    IMAGING: TCD reviewed, slightly increased RMCA and LMCA velocities.  IMPRESSION: - 52 y.o. male SAH d# 7 s/p basilar aneurysm coiling - Neurologically well - hyponatremia in the setting of euvolemia likely SIADH, improving  PLAN: - Cont close neurologic observation - Nimotop/Zocor - Cont current fluids, would increase rate to 125cc/hr given increasing TCD velocities.  - EVD open at 10. - Cx negative to date. If remain neg, will d/c current empiric abx. Will also check BLE doppler.

## 2012-10-15 NOTE — Progress Notes (Signed)
eLink Physician-Brief Progress Note Patient Name: Richard Davenport DOB: 08-Jan-1961 MRN: [id number]  Date of Service  10/15/2012   HPI/Events of Note   C/o vomiting KUB - ileus vs SBO K 4.0 BM+  eICU Interventions  Monitor for now If worse, may need NG to LIS Minimise narcotics   Intervention Category Intermediate Interventions: Diagnostic test evaluation  Niesha Bame V. 10/15/2012, 5:44 PM

## 2012-10-15 NOTE — Progress Notes (Signed)
Dr. Vassie Loll aware of portable KUB results. No orders received at this time but will continue to monitor abdominal distention.   Holly Bodily

## 2012-10-15 NOTE — Progress Notes (Signed)
Dr. Jeral Fruit updated on previous frequent episodes of n/v with no change in neuro status. Pt has not vomited since 3 pm, was given zofran. Order received for portable KUB stat to assess abdomen. No other orders received, will continue to monitor pt.   Holly Bodily

## 2012-10-15 NOTE — Progress Notes (Addendum)
Dr. Ninetta Lights aware of L arm venous duplex findings indicating superficial clot. BP cuff moved to L leg. No orders received.

## 2012-10-16 DIAGNOSIS — R509 Fever, unspecified: Secondary | ICD-10-CM

## 2012-10-16 LAB — BASIC METABOLIC PANEL
BUN: 23 mg/dL (ref 6–23)
CO2: 19 mEq/L (ref 19–32)
Calcium: 8.1 mg/dL — ABNORMAL LOW (ref 8.4–10.5)
Creatinine, Ser: 1.01 mg/dL (ref 0.50–1.35)
GFR calc non Af Amer: 85 mL/min — ABNORMAL LOW (ref >90)
Glucose, Bld: 115 mg/dL — ABNORMAL HIGH (ref 70–99)
Potassium: 3.5 mEq/L (ref 3.5–5.1)

## 2012-10-16 LAB — GLUCOSE, CAPILLARY: Glucose-Capillary: 99 mg/dL (ref 70–99)

## 2012-10-16 LAB — CBC
HCT: 39.1 % (ref 39.0–52.0)
Hemoglobin: 14.3 g/dL (ref 13.0–17.0)
MCH: 34.5 pg — ABNORMAL HIGH (ref 26.0–34.0)
MCHC: 36.6 g/dL — ABNORMAL HIGH (ref 30.0–36.0)
RBC: 4.14 MIL/uL — ABNORMAL LOW (ref 4.22–5.81)

## 2012-10-16 LAB — PHOSPHORUS: Phosphorus: 3 mg/dL (ref 2.3–4.6)

## 2012-10-16 LAB — VANCOMYCIN, TROUGH: Vancomycin Tr: 16.1 ug/mL (ref 10.0–20.0)

## 2012-10-16 LAB — SODIUM: Sodium: 131 mEq/L — ABNORMAL LOW (ref 135–145)

## 2012-10-16 MED ORDER — POTASSIUM CHLORIDE 20 MEQ/15ML (10%) PO LIQD: 40.0000 meq | Freq: Three times a day (TID) | ORAL | Status: DC

## 2012-10-16 MED ORDER — POTASSIUM CHLORIDE CRYS ER 20 MEQ PO TBCR
40.0000 meq | EXTENDED_RELEASE_TABLET | Freq: Three times a day (TID) | ORAL | Status: AC
  Administered 2012-10-16 (×2): 40 meq via ORAL
  Filled 2012-10-16 (×2): qty 2

## 2012-10-16 MED ORDER — ONDANSETRON HCL 4 MG/2ML IJ SOLN
4.0000 mg | INTRAMUSCULAR | Status: DC | PRN
  Administered 2012-10-16 – 2012-10-20 (×5): 4 mg via INTRAVENOUS
  Filled 2012-10-16 (×5): qty 2

## 2012-10-16 MED ORDER — METOCLOPRAMIDE HCL 5 MG/ML IJ SOLN
5.0000 mg | Freq: Four times a day (QID) | INTRAMUSCULAR | Status: DC
  Administered 2012-10-16 (×2): 5 mg via INTRAVENOUS
  Filled 2012-10-16 (×4): qty 1

## 2012-10-16 MED ORDER — METOCLOPRAMIDE HCL 5 MG/ML IJ SOLN
10.0000 mg | Freq: Four times a day (QID) | INTRAMUSCULAR | Status: AC
  Administered 2012-10-16 – 2012-10-18 (×6): 10 mg via INTRAVENOUS
  Filled 2012-10-16 (×6): qty 2

## 2012-10-16 NOTE — Progress Notes (Addendum)
PULMONARY  / CRITICAL CARE MEDICINE  Name: Richard Davenport MRN: [id number] DOB: Apr 26, 1960    ADMISSION DATE:  10/08/2012 CONSULTATION DATE:  10/09/2012  REFERRING MD :  Dr. Conchita Paris PRIMARY SERVICE: Neurosurgery  CHIEF COMPLAINT:  SAH  BRIEF PATIENT DESCRIPTION:  Admitted via ED 9/18 with SAH. Underwent coiling. Self extubated 9/20  SIGNIFICANT EVENTS / STUDIES:  9/18 CT head: SAH with ventriculomegaly 9/18: Diagnostic Angiogram. Coiling of basilar aneurysm. Placement of External ventricular drain 9/19 Right frontal ventriculostomy placement 9/19 CT head: No significant interval change in extensive subarachnoid hemorrhage within the basilar cisterns 9/23- fever, abx started  LINES / TUBES: ETT 9/19 >> 9/20 (self extubated)  CULTURES: 9/23 blood>>> 9/23 CSF>>>  ANTIBIOTICS: vanc 9/23>>> cefraixone 9/23>>>  SUBJECTIVE/OVERNIGHT/INTERVAL HX N/V overnight, AXR with early ileus.  PHYSICAL EXAM  VITAL SIGNS: Temp:  [98.8 F (37.1 C)-100.3 F (37.9 C)] 98.9 F (37.2 C) (09/26 0722) Pulse Rate:  [81-116] 89 (09/26 0900) Resp:  [21-36] 21 (09/26 0900) BP: (128-163)/(66-116) 154/82 mmHg (09/26 0900) SpO2:  [96 %-100 %] 99 % (09/26 0900)  HEMODYNAMICS:   VENTILATOR SETTINGS:   INTAKE / OUTPUT: Intake/Output     09/25 0701 - 09/26 0700 09/26 0701 - 09/27 0700   P.O. 500    I.V. (mL/kg) 2862.5 (32.4) 250 (2.8)   IV Piggyback 959 304   Total Intake(mL/kg) 4321.5 (48.9) 554 (6.3)   Urine (mL/kg/hr) 1125 (0.5)    Emesis/NG output 50 (0)    Drains 156 (0.1) 20 (0.1)   Total Output 1331 20   Net +2990.5 +534        Urine Occurrence 1 x    Stool Occurrence 1 x    Emesis Occurrence 15 x     PHYSICAL EXAMINATION: General: No distress Neuro:  RASS +1. Moves all 4s, follows commands HEENT:  Ventric drain in place no erythema, pus Cardiovascular:  RRR s M Lungs: Clear Abdomen: Soft, NT, ND and hypoactive BS. Ext: no edema, left brachial, small nodule, not red, not  painful, no dc  LABS:  PULMONARY  Recent Labs Lab 10/09/12 1935 10/09/12 2256  PHART 7.353 7.386  PCO2ART 39.8 37.6  PO2ART 163.0* 98.0  HCO3 22.1 22.6  TCO2 23 24  O2SAT 99.0 98.0   CBC  Recent Labs Lab 10/13/12 0410 10/14/12 0435 10/16/12 0500  HGB 13.3 14.9 14.3  HCT 36.2* 40.7 39.1  WBC 7.2 9.3 10.2  PLT 98* 135* 154   COAGULATION No results found for this basename: INR,  in the last 168 hours CARDIAC   No results found for this basename: TROPONINI,  in the last 168 hours No results found for this basename: PROBNP,  in the last 168 hours  CHEMISTRY  Recent Labs Lab 10/11/12 0531  10/13/12 0410 10/14/12 0435  10/14/12 1730 10/14/12 2228 10/15/12 0548 10/15/12 1200 10/16/12 0002 10/16/12 0500  NA 141  < > 136 130*  < > 136 135 137 131* 134* 133*  K 3.0*  < > 3.5 4.0  --  4.0  --  4.0  --   --  3.5  CL 109  < > 105 102  --  107  --  107  --   --  102  CO2 21  < > 19 15*  --  15*  --  16*  --   --  19  GLUCOSE 117*  < > 111* 184*  --  153*  --  131*  --   --  115*  BUN  12  < > 13 17  --  20  --  22  --   --  23  CREATININE 1.05  < > 1.14 0.95  --  0.95  --  0.97  --   --  1.01  CALCIUM 8.1*  < > 8.0* 8.5  --  8.2*  --  8.2*  --   --  8.1*  MG 2.0  --  1.8 2.1  --   --   --   --   --   --  2.1  PHOS 2.8  --  2.7 3.4  --   --   --   --   --   --  3.0  < > = values in this interval not displayed. Estimated Creatinine Clearance: 94.6 ml/min (by C-G formula based on Cr of 1.01).  LIVER No results found for this basename: AST, ALT, ALKPHOS, BILITOT, PROT, ALBUMIN, INR,  in the last 168 hours INFECTIOUS No results found for this basename: LATICACIDVEN, PROCALCITON,  in the last 168 hours  ENDOCRINE CBG (last 3)   Recent Labs  10/15/12 1708 10/15/12 2217 10/16/12 0757  GLUCAP 110* 106* 99    CXR: NNF  ASSESSMENT / PLAN: Principal Problem:   SAH (subarachnoid hemorrhage) Active Problems:   Altered mental status   Acute respiratory failure  with hypoxia  PULMONARY A: Resp failure, resolved P: - IS per RT.  CARDIOVASCULAR A:  No acute issues P: - BP control as ordered.  RENAL A: Hyponatremia in setting of SAH R/o SIADH vs cSW vs iatrogenic.  Na continues to drop. P: - BMET in AM. - Replace K. - Urine Na 115, Urine osm 715, unlikely SIADH or CSW. - ?renal consultation for the persistently dropping Na, will defer to NS. - No free water  GASTROINTESTINAL A: Early ileus P: - Continue diet. - QT of 0.3, start reglan 5 mg IV q6 hours.  HEMATOLOGIC A:   Thrombocytopenia, no acute bleeding P: - Monitor CBC intermittently  INFECTIOUS A: Low grade fever overnight but WBC remains stable (100.8). R/o csf, r/o septic thrombophlebitits P: - Continue vanc - Changed ceftriaxone to ceftaz (nosocomial risk) - May need doppler arm - Abx per ID.  ENDOCRINE A:   Mild hyperglycemia without prior hx of DM P:   - ISS and CBG.  NEUROLOGIC A:  SAH  P:   - Mgmt per NS. - See renal. - Clinically appears well. - BP control per NS.  Alyson Reedy, M.D. [facility] Pulmonary/Critical Care Medicine. Pager: [phone]. After hours pager: [phone].

## 2012-10-16 NOTE — Progress Notes (Signed)
Dr. Molli Knock aware of previous episodes of vomiting, none yet this shift. Zofran order adjusted to q4h instead of previous q6h. MD also aware of abdomen film. Per MD, will start Reglan. QTC interval .40, MD aware.   Holly Bodily

## 2012-10-16 NOTE — Progress Notes (Signed)
[facility]  Date of Admission:  10/08/2012  Antibiotics: Antibiotics Given (last 72 hours)   Date/Time Action Medication Dose Rate   10/13/12 1815 Given   vancomycin (VANCOCIN) IVPB 1000 mg/200 mL premix 1,000 mg 200 mL/hr   10/13/12 1914 Given   cefTRIAXone (ROCEPHIN) 2 g in dextrose 5 % 50 mL IVPB 2 g 100 mL/hr   10/14/12 0235 Given   vancomycin (VANCOCIN) IVPB 1000 mg/200 mL premix 1,000 mg 200 mL/hr   10/14/12 0925 Given   cefTRIAXone (ROCEPHIN) 2 g in dextrose 5 % 50 mL IVPB 2 g 100 mL/hr   10/14/12 1045 Given   vancomycin (VANCOCIN) IVPB 1000 mg/200 mL premix 1,000 mg 200 mL/hr   10/14/12 1229 Given   cefTAZidime (FORTAZ) 2 g in dextrose 5 % 50 mL IVPB 2 g 100 mL/hr   10/14/12 1724 Given   vancomycin (VANCOCIN) IVPB 1000 mg/200 mL premix 1,000 mg 200 mL/hr   10/14/12 2158 Given   cefTAZidime (FORTAZ) 2 g in dextrose 5 % 50 mL IVPB 2 g 100 mL/hr   10/15/12 0245 Given   vancomycin (VANCOCIN) IVPB 1000 mg/200 mL premix 1,000 mg 200 mL/hr   10/15/12 0544 Given   cefTAZidime (FORTAZ) 2 g in dextrose 5 % 50 mL IVPB 2 g 100 mL/hr   10/15/12 1018 Given   vancomycin (VANCOCIN) IVPB 1000 mg/200 mL premix 1,000 mg 200 mL/hr   10/15/12 1454 Given   cefTAZidime (FORTAZ) 2 g in dextrose 5 % 50 mL IVPB 2 g 100 mL/hr   10/15/12 1736 Given   vancomycin (VANCOCIN) IVPB 1000 mg/200 mL premix 1,000 mg 200 mL/hr   10/15/12 2103 Given   cefTAZidime (FORTAZ) 2 g in dextrose 5 % 50 mL IVPB 2 g 100 mL/hr   10/16/12 0207 Given   vancomycin (VANCOCIN) IVPB 1000 mg/200 mL premix 1,000 mg 200 mL/hr   10/16/12 0618 Given   cefTAZidime (FORTAZ) 2 g in dextrose 5 % 50 mL IVPB 2 g 100 mL/hr   10/16/12 0959 Given   vancomycin (VANCOCIN) IVPB 1000 mg/200 mL premix 1,000 mg 200 mL/hr      Subjective: Complaint of continued nausea, vomiting  Objective: Temp:  [98.8 F (37.1 C)-100.3 F (37.9 C)] 98.9 F (37.2 C) (09/26 0722) Pulse Rate:  [81-116] 89 (09/26  1000) Resp:  [21-36] 25 (09/26 1000) BP: (128-176)/(66-116) 176/84 mmHg (09/26 1000) SpO2:  [96 %-100 %] 100 % (09/26 1000)  General: awake, alert, responds appropriately Skin: no rashes Lungs: CTA B Cor: RRR without m/r/g Abdomen: soft, nt, nd Ext: no edema  Lab Results Lab Results  Component Value Date   WBC 10.2 10/16/2012   HGB 14.3 10/16/2012   HCT 39.1 10/16/2012   MCV 94.4 10/16/2012   PLT 154 10/16/2012    Lab Results  Component Value Date   CREATININE 1.01 10/16/2012   BUN 23 10/16/2012   NA 133* 10/16/2012   K 3.5 10/16/2012   CL 102 10/16/2012   CO2 19 10/16/2012    Lab Results  Component Value Date   ALT 19 10/08/2012   AST 22 10/08/2012   ALKPHOS 91 10/08/2012   BILITOT 0.5 10/08/2012      Microbiology: Recent Results (from the past 240 hour(s))  MRSA PCR SCREENING     Status: None   Collection Time    10/09/12 12:41 AM      Result Value Range Status   MRSA by PCR NEGATIVE  NEGATIVE Final  Comment:            The GeneXpert MRSA Assay (FDA     approved for NASAL specimens     only), is one component of a     comprehensive MRSA colonization     surveillance program. It is not     intended to diagnose MRSA     infection nor to guide or     monitor treatment for     MRSA infections.  CLOSTRIDIUM DIFFICILE BY PCR     Status: None   Collection Time    10/12/12  5:19 PM      Result Value Range Status   C difficile by pcr NEGATIVE  NEGATIVE Final  CSF CULTURE     Status: None   Collection Time    10/13/12  1:28 PM      Result Value Range Status   Specimen Description CSF   Final   Special Requests NONE   Final   Gram Stain     Final   Value: WBC PRESENT,BOTH PMN AND MONONUCLEAR     NO ORGANISMS SEEN     Performed at [facility]     Performed at [facility]   Culture     Final   Value: NO GROWTH 2 DAYS     Performed at [facility]   Report Status PENDING   Incomplete  GRAM STAIN     Status: None   Collection Time    10/13/12   1:28 PM      Result Value Range Status   Specimen Description CSF   Final   Special Requests NONE   Final   Gram Stain     Final   Value: DIRECT SMEAR     ABUNDANT WBC PRESENT,BOTH PMN AND MONONUCLEAR     NO ORGANISMS SEEN   Report Status 10/13/2012 FINAL   Final  CULTURE, BLOOD (ROUTINE X 2)     Status: None   Collection Time    10/13/12  2:46 PM      Result Value Range Status   Specimen Description BLOOD RIGHT HAND   Final   Special Requests BOTTLES DRAWN AEROBIC ONLY 5CC   Final   Culture  Setup Time     Final   Value: 10/13/2012 21:02     Performed at [facility]   Culture     Final   Value:        BLOOD CULTURE RECEIVED NO GROWTH TO DATE CULTURE WILL BE HELD FOR 5 DAYS BEFORE ISSUING A FINAL NEGATIVE REPORT     Performed at [facility]   Report Status PENDING   Incomplete  CULTURE, BLOOD (ROUTINE X 2)     Status: None   Collection Time    10/13/12  2:52 PM      Result Value Range Status   Specimen Description BLOOD LEFT UPPER ARM   Final   Special Requests BOTTLES DRAWN AEROBIC ONLY 5CC   Final   Culture  Setup Time     Final   Value: 10/13/2012 21:02     Performed at [facility]   Culture     Final   Value:        BLOOD CULTURE RECEIVED NO GROWTH TO DATE CULTURE WILL BE HELD FOR 5 DAYS BEFORE ISSUING A FINAL NEGATIVE REPORT     Performed at [facility]   Report Status PENDING   Incomplete    Studies/Results:  Dg Chest Port 1 View  10/15/2012   CLINICAL DATA:  Evaluate infiltrates/atelectasis.  EXAM: PORTABLE CHEST - 1 VIEW  COMPARISON:  10/12/2012.  FINDINGS: Cardiomegaly.  Tortuous aorta.  Pulmonary vascular prominence most notable centrally.  No segmental infiltrate or gross pneumothorax.  Gas-filled stomach.  IMPRESSION: No segmental infiltrate or atelectasis.  Cardiomegaly.  Tortuous aorta.  Pulmonary vascular prominence most notable centrally.   Electronically Signed   By: Bridgett Larsson   On: 10/15/2012 07:28   Dg Abd Portable  1v  10/15/2012   CLINICAL DATA:  Nausea and vomiting today.  EXAM: PORTABLE ABDOMEN - 1 VIEW  COMPARISON:  One view abdomen 10/09/2012.  FINDINGS: 1705 hr. Nasogastric tube is no longer visualized. There is moderate diffuse small-bowel distention. Some gas is identified within the transverse colon and descending colon. There is some gas in the rectum. The small bowel appear slightly more distended than the colon. There is no supine evidence of free intraperitoneal air. There are no worrisome calcifications or osseous findings.  IMPRESSION: New bowel distention, primarily involving the small bowel. There is some gas within the colon extending to the rectum. Findings could reflect an early/partial small bowel obstruction or atypical ileus.   Electronically Signed   By: Roxy Horseman   On: 10/15/2012 17:25    Assessment/Plan: 1) fever - no infectious source identified.  C diff not yet sent but no significant stool output so unlikely.  No positive cultures.  Has superficial thrombus.   -seems to be non infectious due to thrombus or hemorrhage.  -I will d/c antibiotics and observe.    Staci Righter, MD [email or web address] [phone] pager   [phone] cell 10/16/2012, 10:20 AM

## 2012-10-16 NOTE — Progress Notes (Addendum)
Dr. Jeral Fruit aware of TCD results from today and aware of BP increase to 160s-180 and labetalol given 4x prn. Per MD, will continue to keep IVF at current rate of 125, BP goal <160, and order a serum osmolality STAT. Pt has been waxing and waning today between alert and sleepy but still continues to MAE and FC although displaying some confusion vs. Oriented. Will continue to monitor.   Update: BP stable, 120s-130s via R arm cuff.   Richard Davenport

## 2012-10-16 NOTE — Progress Notes (Addendum)
Dr. Jeral Fruit rounding on pt. Pt currently asleep but arouses, FC and MAE. Pupils 4mm brisk. MD aware of pt's confusion. Will continue to monitor. Wife updated by MD.   Richard Davenport

## 2012-10-16 NOTE — Progress Notes (Signed)
Patient ID: Richard Davenport, male   DOB: January 24, 1960, 52 y.o.   MRN: [id number] Stable, confused but able to f/c when awake. Moves all 4 extremities. To get tcd. IVC working well with bllody csf. Abdomen some distention. Seen by CCM. SPOKE WITH HIS WIFE

## 2012-10-16 NOTE — Progress Notes (Signed)
Transcranial Doppler  Date POD PCO2 HCT BP  MCA ACA PCA OPHT SIPH VERT Basilar  10/12/12 MS     Right  Left   96  28   -34  -24   -24  -37   34  27   -31  29   -46  -25     -48    9-24- 14 SB     Right  Left   141  117   -35  -45   23  --   44  33   34  37   -29  -39     -69    9-26- 14 SB     Right  Left   163  127   -31  -96   -42  25   25  16    34  33   -31  -21     -47          Right  Left                                             Right  Left                                            Right  Left                                            Right  Left                                        MCA = Middle Cerebral Artery      OPHT = Opthalmic Artery     BASILAR = Basilar Artery   ACA = Anterior Cerebral Artery     SIPH = Carotid Siphon PCA = Posterior Cerebral Artery   VERT = Verterbral Artery                   Normal MCA = 62+\-12 ACA = 50+\-12 PCA = 42+\-23     10/16/2012 11:36 AM Gertie Fey, RVT, RDCS, RDMS

## 2012-10-17 LAB — BASIC METABOLIC PANEL
CO2: 18 mEq/L — ABNORMAL LOW (ref 19–32)
Chloride: 103 mEq/L (ref 96–112)
Creatinine, Ser: 0.97 mg/dL (ref 0.50–1.35)
GFR calc Af Amer: >90 mL/min (ref >90)
GFR calc non Af Amer: >90 mL/min (ref >90)
Potassium: 3.9 mEq/L (ref 3.5–5.1)
Sodium: 133 mEq/L — ABNORMAL LOW (ref 135–145)

## 2012-10-17 LAB — CBC
MCV: 94.7 fL (ref 78.0–100.0)
Platelets: 172 10*3/uL (ref 150–400)
RBC: 3.97 MIL/uL — ABNORMAL LOW (ref 4.22–5.81)
RDW: 12.3 % (ref 11.5–15.5)
WBC: 10.8 10*3/uL — ABNORMAL HIGH (ref 4.0–10.5)

## 2012-10-17 LAB — CSF CULTURE W GRAM STAIN: Culture: NO GROWTH

## 2012-10-17 LAB — PHOSPHORUS: Phosphorus: 2.8 mg/dL (ref 2.3–4.6)

## 2012-10-17 LAB — SODIUM: Sodium: 133 mEq/L — ABNORMAL LOW (ref 135–145)

## 2012-10-17 LAB — CSF CULTURE

## 2012-10-17 LAB — MAGNESIUM: Magnesium: 2.3 mg/dL (ref 1.5–2.5)

## 2012-10-17 LAB — GLUCOSE, CAPILLARY: Glucose-Capillary: 88 mg/dL (ref 70–99)

## 2012-10-17 MED ORDER — FLUDROCORTISONE ACETATE 0.1 MG PO TABS
0.1000 mg | ORAL_TABLET | Freq: Every day | ORAL | Status: DC
  Administered 2012-10-17: 0.1 mg via ORAL
  Filled 2012-10-17 (×2): qty 1

## 2012-10-17 MED ORDER — LEVETIRACETAM 500 MG PO TABS
500.0000 mg | ORAL_TABLET | Freq: Two times a day (BID) | ORAL | Status: DC
  Administered 2012-10-17 – 2012-10-27 (×20): 500 mg via ORAL
  Filled 2012-10-17 (×21): qty 1

## 2012-10-17 NOTE — Progress Notes (Addendum)
PULMONARY  / CRITICAL CARE MEDICINE  Name: Richard Davenport MRN: [id number] DOB: [birth year]    ADMISSION DATE:  10/08/2012 CONSULTATION DATE:  10/09/2012  REFERRING MD :  Dr. Conchita Paris PRIMARY SERVICE: Neurosurgery  CHIEF COMPLAINT:  SAH  BRIEF PATIENT DESCRIPTION:  Admitted via ED 9/18 with SAH. Underwent coiling. Self extubated 9/20  SIGNIFICANT EVENTS / STUDIES:  9/18 CT head: SAH with ventriculomegaly 9/18: Diagnostic Angiogram. Coiling of basilar aneurysm. Placement of External ventricular drain 9/19 Right frontal ventriculostomy placement 9/19 CT head: No significant interval change in extensive subarachnoid hemorrhage within the basilar cisterns 9/23- fever, abx started  LINES / TUBES: ETT 9/19 >> 9/20 (self extubated)  CULTURES: 9/23 blood>>> 9/23 CSF>>>  ANTIBIOTICS: vanc 9/23>>>9/26 cefraixone 9/23>>>9/26  SUBJECTIVE/OVERNIGHT/INTERVAL HX N/V overnight, AXR with early ileus.  PHYSICAL EXAM  VITAL SIGNS: Temp:  [98.8 F (37.1 C)-102.5 F (39.2 C)] 98.9 F (37.2 C) (09/27 0400) Pulse Rate:  [69-103] 69 (09/27 0700) Resp:  [17-30] 19 (09/27 0700) BP: (110-180)/(67-107) 138/81 mmHg (09/27 0700) SpO2:  [94 %-100 %] 94 % (09/27 0700) Weight:  [91.3 kg (201 lb 4.5 oz)] 91.3 kg (201 lb 4.5 oz) (09/27 0500)  HEMODYNAMICS:   VENTILATOR SETTINGS:   INTAKE / OUTPUT: Intake/Output     09/26 0701 - 09/27 0700 09/27 0701 - 09/28 0700   P.O.     I.V. (mL/kg) 2875 (31.5)    IV Piggyback 409    Total Intake(mL/kg) 3284 (36)    Urine (mL/kg/hr) 1950 (0.9)    Emesis/NG output     Drains 164 (0.1)    Total Output 2114     Net +1170          Emesis Occurrence 9 x     PHYSICAL EXAMINATION: General: No distress Neuro:  RASS +1. Moves all 4s, follows commands HEENT:  Ventric drain in place no erythema, pus Cardiovascular:  RRR s M Lungs: Clear Abdomen: Soft, NT, ND and hypoactive BS. Ext: no edema, left brachial, small nodule, not red, not painful, no  dc  LABS:  PULMONARY No results found for this basename: PHART, PCO2, PCO2ART, PO2, PO2ART, HCO3, TCO2, O2SAT,  in the last 168 hours CBC  Recent Labs Lab 10/14/12 0435 10/16/12 0500 10/17/12 0415  HGB 14.9 14.3 13.7  HCT 40.7 39.1 37.6*  WBC 9.3 10.2 10.8*  PLT 135* 154 172   COAGULATION No results found for this basename: INR,  in the last 168 hours CARDIAC   No results found for this basename: TROPONINI,  in the last 168 hours No results found for this basename: PROBNP,  in the last 168 hours  CHEMISTRY  Recent Labs Lab 10/11/12 0531  10/13/12 0410 10/14/12 0435  10/14/12 1730  10/15/12 0548  10/16/12 0002 10/16/12 0500 10/16/12 1527 10/16/12 2130 10/17/12 0415  NA 141  < > 136 130*  < > 136  < > 137  < > 134* 133* 131* 134* 133*  K 3.0*  < > 3.5 4.0  --  4.0  --  4.0  --   --  3.5  --   --  3.9  CL 109  < > 105 102  --  107  --  107  --   --  102  --   --  103  CO2 21  < > 19 15*  --  15*  --  16*  --   --  19  --   --  18*  GLUCOSE 117*  < > 111*  184*  --  153*  --  131*  --   --  115*  --   --  102*  BUN 12  < > 13 17  --  20  --  22  --   --  23  --   --  24*  CREATININE 1.05  < > 1.14 0.95  --  0.95  --  0.97  --   --  1.01  --   --  0.97  CALCIUM 8.1*  < > 8.0* 8.5  --  8.2*  --  8.2*  --   --  8.1*  --   --  8.1*  MG 2.0  --  1.8 2.1  --   --   --   --   --   --  2.1  --   --  2.3  PHOS 2.8  --  2.7 3.4  --   --   --   --   --   --  3.0  --   --  2.8  < > = values in this interval not displayed. Estimated Creatinine Clearance: 100 ml/min (by C-G formula based on Cr of 0.97).  LIVER No results found for this basename: AST, ALT, ALKPHOS, BILITOT, PROT, ALBUMIN, INR,  in the last 168 hours INFECTIOUS No results found for this basename: LATICACIDVEN, PROCALCITON,  in the last 168 hours  ENDOCRINE CBG (last 3)   Recent Labs  10/16/12 1157 10/16/12 1714 10/16/12 2212  GLUCAP 115* 99 101*    CXR: NNF  ASSESSMENT / PLAN: Principal Problem:    SAH (subarachnoid hemorrhage) Active Problems:   Altered mental status   Acute respiratory failure with hypoxia  PULMONARY A: Resp failure, resolved P: - IS per RT.  CARDIOVASCULAR A:  No acute issues P: - BP control as ordered.  RENAL A: Hyponatremia in setting of SAH R/o SIADH vs CSW vs iatrogenic.  Na continues to drop, U NA is 117 lower than serum.  Not likely SIADH, CSW is also unlikely given that urine Na is lower than serum.  More likely iatrogenic. P: - BMET in AM. - Replace K. - Urine Na 117, Urine osm 715, unlikely SIADH or CSW. - ?renal consultation for the persistently dropping Na, will defer to NS. - No free water. - Will start fludrocortisone and recheck BMET.  GASTROINTESTINAL A: Early ileus P: - Continue diet. - QT of 0.3, start reglan 5 mg IV q6 hours.  HEMATOLOGIC A:   Thrombocytopenia, no acute bleeding P: - Monitor CBC intermittently  INFECTIOUS A: Low grade fever overnight but WBC remains stable (100.8). R/o csf, r/o septic thrombophlebitits P: - May need doppler arm - D/C abx on 9/26, will continue to monitor.  ENDOCRINE A:   Mild hyperglycemia without prior hx of DM P:   - ISS and CBG.  NEUROLOGIC A:  SAH  P:   - Mgmt per NS. - See renal. - Clinically appears well. - BP control per NS.  Alyson Reedy, M.D. [facility] Pulmonary/Critical Care Medicine. Pager: [phone]. After hours pager: [phone].

## 2012-10-17 NOTE — Progress Notes (Signed)
Patient ID: Richard Davenport, male   DOB: [birth year], 52 y.o.   MRN: [id number] Less confused, f/c. ivc still with bllody drainage. Moves all 4 extremities. Nausea and vomiting under better control. Spoke with daughter

## 2012-10-17 NOTE — Progress Notes (Signed)
Patient ID: Richard Davenport, male   DOB: [birth year], 52 y.o.   MRN: [id number] Off and on confusion, moves all extremities. ivc working weell

## 2012-10-17 NOTE — Progress Notes (Signed)
Pharmacy: IV to po Conversion  Patient noted on Keppra IV and:  -tolerating po medications -no history of seizures  Per P&T policy will convert Keppra IV to po.  If you have questions about this conversion, please contact the pharmacy department.  Thank you, Harland German, Pharm D 10/17/2012 3:15 PM

## 2012-10-17 NOTE — Progress Notes (Signed)
Patient ID: Richard Davenport, male   DOB: [birth year], 52 y.o.   MRN: [id number]

## 2012-10-18 LAB — GLUCOSE, CAPILLARY
Glucose-Capillary: 90 mg/dL (ref 70–99)
Glucose-Capillary: 103 mg/dL — ABNORMAL HIGH (ref 70–99)
Glucose-Capillary: 119 mg/dL — ABNORMAL HIGH (ref 70–99)
Glucose-Capillary: 103 mg/dL — ABNORMAL HIGH (ref 70–99)

## 2012-10-18 LAB — MAGNESIUM: Magnesium: 2.2 mg/dL (ref 1.5–2.5)

## 2012-10-18 LAB — CBC
Hemoglobin: 13.1 g/dL (ref 13.0–17.0)
MCH: 34.6 pg — ABNORMAL HIGH (ref 26.0–34.0)
MCV: 95.5 fL (ref 78.0–100.0)
Platelets: 161 10*3/uL (ref 150–400)
RDW: 12.4 % (ref 11.5–15.5)
WBC: 11 10*3/uL — ABNORMAL HIGH (ref 4.0–10.5)

## 2012-10-18 LAB — BASIC METABOLIC PANEL
BUN: 20 mg/dL (ref 6–23)
CO2: 17 mEq/L — ABNORMAL LOW (ref 19–32)
Calcium: 7.4 mg/dL — ABNORMAL LOW (ref 8.4–10.5)
Creatinine, Ser: 0.9 mg/dL (ref 0.50–1.35)
GFR calc Af Amer: >90 mL/min (ref >90)
Glucose, Bld: 96 mg/dL (ref 70–99)
Potassium: 3.5 mEq/L (ref 3.5–5.1)

## 2012-10-18 LAB — SODIUM
Sodium: 133 mEq/L — ABNORMAL LOW (ref 135–145)
Sodium: 132 mEq/L — ABNORMAL LOW (ref 135–145)

## 2012-10-18 MED ORDER — FLUDROCORTISONE ACETATE 0.1 MG PO TABS
0.2000 mg | ORAL_TABLET | Freq: Every day | ORAL | Status: DC
  Administered 2012-10-18 – 2012-10-23 (×6): 0.2 mg via ORAL
  Filled 2012-10-18 (×6): qty 2

## 2012-10-18 MED ORDER — POTASSIUM CHLORIDE CRYS ER 20 MEQ PO TBCR
20.0000 meq | EXTENDED_RELEASE_TABLET | ORAL | Status: DC
  Administered 2012-10-18: 20 meq via ORAL
  Filled 2012-10-18: qty 1

## 2012-10-18 MED ORDER — POTASSIUM CHLORIDE CRYS ER 20 MEQ PO TBCR
40.0000 meq | EXTENDED_RELEASE_TABLET | Freq: Three times a day (TID) | ORAL | Status: AC
  Administered 2012-10-18 (×2): 40 meq via ORAL
  Filled 2012-10-18 (×2): qty 2

## 2012-10-18 NOTE — Progress Notes (Signed)
PULMONARY  / CRITICAL CARE MEDICINE  Name: Richard Davenport MRN: [id number] DOB: 09-17-1960    ADMISSION DATE:  10/08/2012 CONSULTATION DATE:  10/09/2012  REFERRING MD :  Dr. Conchita Paris PRIMARY SERVICE: Neurosurgery  CHIEF COMPLAINT:  SAH  BRIEF PATIENT DESCRIPTION:  Admitted via ED 9/18 with SAH. Underwent coiling. Self extubated 9/20  SIGNIFICANT EVENTS / STUDIES:  9/18 CT head: SAH with ventriculomegaly 9/18: Diagnostic Angiogram. Coiling of basilar aneurysm. Placement of External ventricular drain 9/19 Right frontal ventriculostomy placement 9/19 CT head: No significant interval change in extensive subarachnoid hemorrhage within the basilar cisterns 9/23- fever, abx started  LINES / TUBES: ETT 9/19 >> 9/20 (self extubated)  CULTURES: 9/23 blood>>> 9/23 CSF>>>  ANTIBIOTICS: vanc 9/23>>>9/26 cefraixone 9/23>>>9/26  SUBJECTIVE/OVERNIGHT/INTERVAL HX N/V overnight, AXR with early ileus.  PHYSICAL EXAM  VITAL SIGNS: Temp:  [98.6 F (37 C)-100.1 F (37.8 C)] 98.9 F (37.2 C) (09/28 0800) Pulse Rate:  [73-115] 95 (09/28 0600) Resp:  [16-28] 27 (09/28 0700) BP: (112-151)/(59-88) 130/78 mmHg (09/28 0700) SpO2:  [97 %-100 %] 100 % (09/28 0600) Weight:  [92.7 kg (204 lb 5.9 oz)] 92.7 kg (204 lb 5.9 oz) (09/28 0500)  HEMODYNAMICS:   VENTILATOR SETTINGS:   INTAKE / OUTPUT: Intake/Output     09/27 0701 - 09/28 0700 09/28 0701 - 09/29 0700   P.O. 300    I.V. (mL/kg) 2875 (31)    IV Piggyback     Total Intake(mL/kg) 3175 (34.3)    Urine (mL/kg/hr) 2850 (1.3)    Drains 151 (0.1)    Total Output 3001     Net +174          Urine Occurrence 1 x    Stool Occurrence 1 x     PHYSICAL EXAMINATION: General: No distress Neuro:  RASS +1. Moves all 4s, follows commands HEENT:  Ventric drain in place no erythema, pus Cardiovascular:  RRR s M Lungs: Clear Abdomen: Soft, NT, ND and hypoactive BS. Ext: no edema, left brachial, small nodule, not red, not painful, no  dc  LABS:  PULMONARY No results found for this basename: PHART, PCO2, PCO2ART, PO2, PO2ART, HCO3, TCO2, O2SAT,  in the last 168 hours CBC  Recent Labs Lab 10/16/12 0500 10/17/12 0415 10/18/12 0555  HGB 14.3 13.7 13.1  HCT 39.1 37.6* 36.2*  WBC 10.2 10.8* 11.0*  PLT 154 172 161   COAGULATION No results found for this basename: INR,  in the last 168 hours CARDIAC   No results found for this basename: TROPONINI,  in the last 168 hours No results found for this basename: PROBNP,  in the last 168 hours  CHEMISTRY  Recent Labs Lab 10/13/12 0410 10/14/12 0435  10/14/12 1730  10/15/12 0548  10/16/12 0500  10/16/12 2130 10/17/12 0415 10/17/12 1210 10/17/12 2255 10/18/12 0555  NA 136 130*  < > 136  < > 137  < > 133*  < > 134* 133* 133* 133* 133*  K 3.5 4.0  --  4.0  --  4.0  --  3.5  --   --  3.9  --   --  3.5  CL 105 102  --  107  --  107  --  102  --   --  103  --   --  105  CO2 19 15*  --  15*  --  16*  --  19  --   --  18*  --   --  17*  GLUCOSE 111* 184*  --  153*  --  131*  --  115*  --   --  102*  --   --  96  BUN 13 17  --  20  --  22  --  23  --   --  24*  --   --  20  CREATININE 1.14 0.95  --  0.95  --  0.97  --  1.01  --   --  0.97  --   --  0.90  CALCIUM 8.0* 8.5  --  8.2*  --  8.2*  --  8.1*  --   --  8.1*  --   --  7.4*  MG 1.8 2.1  --   --   --   --   --  2.1  --   --  2.3  --   --  2.2  PHOS 2.7 3.4  --   --   --   --   --  3.0  --   --  2.8  --   --  2.7  < > = values in this interval not displayed. Estimated Creatinine Clearance: 108.5 ml/min (by C-G formula based on Cr of 0.9).  LIVER No results found for this basename: AST, ALT, ALKPHOS, BILITOT, PROT, ALBUMIN, INR,  in the last 168 hours INFECTIOUS No results found for this basename: LATICACIDVEN, PROCALCITON,  in the last 168 hours  ENDOCRINE CBG (last 3)   Recent Labs  10/17/12 1634 10/17/12 2148 10/18/12 0715  GLUCAP 95 139* 90    CXR: NNF  ASSESSMENT / PLAN: Principal Problem:    SAH (subarachnoid hemorrhage) Active Problems:   Altered mental status   Acute respiratory failure with hypoxia  PULMONARY A: Resp failure, resolved P: - IS per RT.  CARDIOVASCULAR A:  No acute issues P: - BP control as ordered.  RENAL A: Hyponatremia in setting of SAH R/o SIADH vs CSW vs iatrogenic.  Na continues to drop, U NA is 117 lower than serum.  Not likely SIADH, CSW is also unlikely given that urine Na is lower than serum.  More likely iatrogenic. P: - BMET in AM. - Replace K. - Urine Na 117, Urine osm 715, unlikely SIADH or CSW. - ?renal consultation for the persistently dropping Na, will defer to NS. - No free water. - Will Increase fludrocortisone to 0.2 mg per day.  GASTROINTESTINAL A: Early ileus P: - Continue diet. - QT of 0.3, continue reglan 5 mg IV q6 hours.  HEMATOLOGIC A:   Thrombocytopenia, no acute bleeding P: - Monitor CBC intermittently.  INFECTIOUS A: Low grade fever overnight but WBC remains stable (100.8). R/o csf, r/o septic thrombophlebitits P: - May need doppler arm. - D/C abx on 9/26, will continue to monitor.  ENDOCRINE A:   Mild hyperglycemia without prior hx of DM P:   - ISS and CBG.  NEUROLOGIC A:  SAH  P:   - Mgmt per NS. - See renal. - Clinically appears well. - BP control per NS.  Alyson Reedy, M.D. [facility] Pulmonary/Critical Care Medicine. Pager: [phone]. After hours pager: [phone].

## 2012-10-18 NOTE — Progress Notes (Signed)
[facility]  Date of Admission:  10/08/2012  Antibiotics: Antibiotics Given (last 72 hours)   Date/Time Action Medication Dose Rate   10/15/12 1454 Given   cefTAZidime (FORTAZ) 2 g in dextrose 5 % 50 mL IVPB 2 g 100 mL/hr   10/15/12 1736 Given   vancomycin (VANCOCIN) IVPB 1000 mg/200 mL premix 1,000 mg 200 mL/hr   10/15/12 2103 Given   cefTAZidime (FORTAZ) 2 g in dextrose 5 % 50 mL IVPB 2 g 100 mL/hr   10/16/12 0207 Given   vancomycin (VANCOCIN) IVPB 1000 mg/200 mL premix 1,000 mg 200 mL/hr   10/16/12 0618 Given   cefTAZidime (FORTAZ) 2 g in dextrose 5 % 50 mL IVPB 2 g 100 mL/hr   10/16/12 0959 Given   vancomycin (VANCOCIN) IVPB 1000 mg/200 mL premix 1,000 mg 200 mL/hr      Subjective: No fever  Objective: Temp:  [98.6 F (37 C)-100.1 F (37.8 C)] 98.9 F (37.2 C) (09/28 0800) Pulse Rate:  [73-115] 95 (09/28 0600) Resp:  [16-28] 27 (09/28 0700) BP: (112-151)/(59-87) 130/78 mmHg (09/28 0700) SpO2:  [97 %-100 %] 100 % (09/28 0600) Weight:  [204 lb 5.9 oz (92.7 kg)] 204 lb 5.9 oz (92.7 kg) (09/28 0500)  General: sleeping Skin: no rashes   Lab Results Lab Results  Component Value Date   WBC 11.0* 10/18/2012   HGB 13.1 10/18/2012   HCT 36.2* 10/18/2012   MCV 95.5 10/18/2012   PLT 161 10/18/2012    Lab Results  Component Value Date   CREATININE 0.90 10/18/2012   BUN 20 10/18/2012   NA 133* 10/18/2012   K 3.5 10/18/2012   CL 105 10/18/2012   CO2 17* 10/18/2012    Lab Results  Component Value Date   ALT 19 10/08/2012   AST 22 10/08/2012   ALKPHOS 91 10/08/2012   BILITOT 0.5 10/08/2012      Microbiology: Recent Results (from the past 240 hour(s))  MRSA PCR SCREENING     Status: None   Collection Time    10/09/12 12:41 AM      Result Value Range Status   MRSA by PCR NEGATIVE  NEGATIVE Final   Comment:            The GeneXpert MRSA Assay (FDA     approved for NASAL specimens     only), is one component of a     comprehensive MRSA  colonization     surveillance program. It is not     intended to diagnose MRSA     infection nor to guide or     monitor treatment for     MRSA infections.  CLOSTRIDIUM DIFFICILE BY PCR     Status: None   Collection Time    10/12/12  5:19 PM      Result Value Range Status   C difficile by pcr NEGATIVE  NEGATIVE Final  CSF CULTURE     Status: None   Collection Time    10/13/12  1:28 PM      Result Value Range Status   Specimen Description CSF   Final   Special Requests NONE   Final   Gram Stain     Final   Value: WBC PRESENT,BOTH PMN AND MONONUCLEAR     NO ORGANISMS SEEN     Performed at [facility]     Performed at [facility]   Culture     Final   Value: NO  GROWTH 3 DAYS     Performed at [facility]   Report Status 10/17/2012 FINAL   Final  GRAM STAIN     Status: None   Collection Time    10/13/12  1:28 PM      Result Value Range Status   Specimen Description CSF   Final   Special Requests NONE   Final   Gram Stain     Final   Value: DIRECT SMEAR     ABUNDANT WBC PRESENT,BOTH PMN AND MONONUCLEAR     NO ORGANISMS SEEN   Report Status 10/13/2012 FINAL   Final  CULTURE, BLOOD (ROUTINE X 2)     Status: None   Collection Time    10/13/12  2:46 PM      Result Value Range Status   Specimen Description BLOOD RIGHT HAND   Final   Special Requests BOTTLES DRAWN AEROBIC ONLY 5CC   Final   Culture  Setup Time     Final   Value: 10/13/2012 21:02     Performed at [facility]   Culture     Final   Value:        BLOOD CULTURE RECEIVED NO GROWTH TO DATE CULTURE WILL BE HELD FOR 5 DAYS BEFORE ISSUING A FINAL NEGATIVE REPORT     Performed at [facility]   Report Status PENDING   Incomplete  CULTURE, BLOOD (ROUTINE X 2)     Status: None   Collection Time    10/13/12  2:52 PM      Result Value Range Status   Specimen Description BLOOD LEFT UPPER ARM   Final   Special Requests BOTTLES DRAWN AEROBIC ONLY 5CC   Final   Culture  Setup Time      Final   Value: 10/13/2012 21:02     Performed at [facility]   Culture     Final   Value:        BLOOD CULTURE RECEIVED NO GROWTH TO DATE CULTURE WILL BE HELD FOR 5 DAYS BEFORE ISSUING A FINAL NEGATIVE REPORT     Performed at [facility]   Report Status PENDING   Incomplete    Studies/Results: No results found.  Assessment/Plan: 1) fever - no infectious source identified and now afebrile > 24 hours.  Continues off of antibiotics.    I will sign off, please call with questions, thanks.    Staci Righter, MD [email or web address] [phone] pager   [phone] cell 10/18/2012, 11:41 AM

## 2012-10-18 NOTE — Plan of Care (Signed)
Problem: Acute Treatment Outcomes Goal: Neuro exam at baseline or improved Outcome: Progressing Patient still displaying periods of confusion.

## 2012-10-18 NOTE — Progress Notes (Signed)
Patient ID: Richard Davenport, male   DOB: July 19, 1960, 52 y.o.   MRN: [id number] More awake. F/c. IVC working well with csf less bloody. No changes in treatment

## 2012-10-18 NOTE — Progress Notes (Signed)
[facility] ADULT ICU REPLACEMENT PROTOCOL FOR AM LAB REPLACEMENT ONLY  The patient does apply for the [facility] Adult ICU Electrolyte Replacment Protocol based on the criteria listed below:   1. Is GFR >/= 40 ml/min? yes  Patient's GFR today is >90 2. Is urine output >/= 0.5 ml/kg/hr for the last 6 hours? yes Patient's UOP is 1.8 ml/kg/hr 3. Is BUN < 60 mg/dL? yes  Patient's BUN today is 20 4. Abnormal electrolyte(s): K+ 3.5 5. Ordered repletion with:protocol 6. If a panic level lab has been reported, has the CCM MD in charge been notified? yes.   Physician:  Christoper Allegra 10/18/2012 6:46 AM

## 2012-10-19 LAB — CULTURE, BLOOD (ROUTINE X 2)

## 2012-10-19 LAB — PHOSPHORUS: Phosphorus: 2.9 mg/dL (ref 2.3–4.6)

## 2012-10-19 LAB — GLUCOSE, CAPILLARY
Glucose-Capillary: 96 mg/dL (ref 70–99)
Glucose-Capillary: 97 mg/dL (ref 70–99)
Glucose-Capillary: 104 mg/dL — ABNORMAL HIGH (ref 70–99)
Glucose-Capillary: 126 mg/dL — ABNORMAL HIGH (ref 70–99)

## 2012-10-19 LAB — CBC
HCT: 36.5 % — ABNORMAL LOW (ref 39.0–52.0)
Platelets: 183 10*3/uL (ref 150–400)
RDW: 12.1 % (ref 11.5–15.5)
WBC: 11.1 10*3/uL — ABNORMAL HIGH (ref 4.0–10.5)

## 2012-10-19 LAB — MAGNESIUM: Magnesium: 2.1 mg/dL (ref 1.5–2.5)

## 2012-10-19 LAB — BASIC METABOLIC PANEL
BUN: 16 mg/dL (ref 6–23)
Chloride: 102 mEq/L (ref 96–112)
Creatinine, Ser: 0.78 mg/dL (ref 0.50–1.35)
GFR calc Af Amer: >90 mL/min (ref >90)
GFR calc non Af Amer: >90 mL/min (ref >90)
Potassium: 3.7 mEq/L (ref 3.5–5.1)
Sodium: 132 mEq/L — ABNORMAL LOW (ref 135–145)

## 2012-10-19 LAB — SODIUM: Sodium: 132 mEq/L — ABNORMAL LOW (ref 135–145)

## 2012-10-19 MED ORDER — POTASSIUM CHLORIDE CRYS ER 20 MEQ PO TBCR
40.0000 meq | EXTENDED_RELEASE_TABLET | Freq: Three times a day (TID) | ORAL | Status: AC
  Administered 2012-10-19 (×2): 40 meq via ORAL
  Filled 2012-10-19 (×2): qty 2

## 2012-10-19 MED ORDER — METOCLOPRAMIDE HCL 5 MG PO TABS
5.0000 mg | ORAL_TABLET | Freq: Three times a day (TID) | ORAL | Status: DC
  Administered 2012-10-19 – 2012-10-23 (×11): 5 mg via ORAL
  Filled 2012-10-19 (×15): qty 1

## 2012-10-19 NOTE — Progress Notes (Signed)
Transcranial Doppler  Date POD PCO2 HCT BP  MCA ACA PCA OPHT SIPH VERT Basilar  10/12/12 MS     Right  Left   96  28   -34  -24   -24  -37   34  27   -31  29   -46  -25     -48    9-24- 14 SB     Right  Left   141  117   -35  -45   23  --   44  33   34  37   -29  -39     -69    9-26- 14 SB     Right  Left   163  127   -31  -96   -42  25   25  16    34  33   -31  -21     -47    9-29- 14 SB      Right  Left   125  90   -67  -40   75/37  21/37   34  37   26  28   -27  -30     -72          Right  Left                                            Right  Left                                            Right  Left                                        MCA = Middle Cerebral Artery      OPHT = Opthalmic Artery     BASILAR = Basilar Artery   ACA = Anterior Cerebral Artery     SIPH = Carotid Siphon PCA = Posterior Cerebral Artery   VERT = Verterbral Artery                   Normal MCA = 62+\-12 ACA = 50+\-12 PCA = 42+\-23     10/19/2012 10:39 AM Gertie Fey, RVT, RDCS, RDMS

## 2012-10-19 NOTE — Progress Notes (Addendum)
IVC chamber flushed with NS by Dr. Conchita Paris due to small material noted in tubing. Insertion site observed by MD; sutured in place

## 2012-10-19 NOTE — Progress Notes (Signed)
Pt seen and examined. No issues overnight. Pt c/o HA relieved by ordered meds. No other c/o.  EXAM: Temp:  [98.1 F (36.7 C)-100 F (37.8 C)] 98.1 F (36.7 C) (09/29 0700) Pulse Rate:  [86-105] 92 (09/29 0700) Resp:  [18-30] 23 (09/29 0700) BP: (113-145)/(58-85) 133/79 mmHg (09/29 0700) SpO2:  [96 %-100 %] 98 % (09/29 0700) Intake/Output     09/28 0701 - 09/29 0700 09/29 0701 - 09/30 0700   P.O.     I.V. (mL/kg) 3000 (32.4)    Total Intake(mL/kg) 3000 (32.4)    Urine (mL/kg/hr) 3400 (1.5)    Drains 73 (0) 12 (0)   Total Output 3473 12   Net -473 -12        Urine Occurrence 1 x     EVD in place, 73cc x 24 hrs open at Awake, alert, oriented to person, place, year CN grossly intact Follows commands BUE/BLE  LABS: Lab Results  Component Value Date   CREATININE 0.78 10/19/2012   BUN 16 10/19/2012   NA 132* 10/19/2012   K 3.7 10/19/2012   CL 102 10/19/2012   CO2 17* 10/19/2012   Lab Results  Component Value Date   WBC 11.1* 10/19/2012   HGB 13.2 10/19/2012   HCT 36.5* 10/19/2012   MCV 93.8 10/19/2012   PLT 183 10/19/2012    IMAGING: No new imaging overnight  IMPRESSION: - 52 y.o. 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

## 2012-10-19 NOTE — Progress Notes (Signed)
PULMONARY  / CRITICAL CARE MEDICINE  Name: Richard Davenport MRN: [id number] DOB: [birth year]    ADMISSION DATE:  10/08/2012 CONSULTATION DATE:  10/09/2012  REFERRING MD :  Dr. Conchita Paris PRIMARY SERVICE: Neurosurgery  CHIEF COMPLAINT:  SAH  BRIEF PATIENT DESCRIPTION:  Admitted via ED 9/18 with SAH. Underwent coiling. Self extubated 9/20  SIGNIFICANT EVENTS / STUDIES:  9/18 CT head: SAH with ventriculomegaly 9/18: Diagnostic Angiogram. Coiling of basilar aneurysm. Placement of External ventricular drain 9/19 Right frontal ventriculostomy placement 9/19 CT head: No significant interval change in extensive subarachnoid hemorrhage within the basilar cisterns 9/23- fever, abx started  LINES / TUBES: ETT 9/19 >> 9/20 (self extubated)  CULTURES: 9/23 blood>>> 9/23 CSF>>>  ANTIBIOTICS: vanc 9/23>>>9/26 cefraixone 9/23>>>9/26  SUBJECTIVE/OVERNIGHT/INTERVAL HX N/V overnight, AXR with early ileus.  PHYSICAL EXAM  VITAL SIGNS: Temp:  [98.1 F (36.7 C)-100 F (37.8 C)] 98.1 F (36.7 C) (09/29 0700) Pulse Rate:  [87-105] 92 (09/29 0700) Resp:  [18-30] 23 (09/29 0700) BP: (113-145)/(58-85) 133/79 mmHg (09/29 0700) SpO2:  [96 %-100 %] 98 % (09/29 0700)  HEMODYNAMICS:   VENTILATOR SETTINGS:   INTAKE / OUTPUT: Intake/Output     09/28 0701 - 09/29 0700 09/29 0701 - 09/30 0700   P.O.     I.V. (mL/kg) 3000 (32.4)    Total Intake(mL/kg) 3000 (32.4)    Urine (mL/kg/hr) 3400 (1.5) 525 (1.5)   Drains 73 (0) 12 (0)   Total Output 3473 537   Net -473 -537        Urine Occurrence 1 x     PHYSICAL EXAMINATION: General: No distress Neuro:  RASS +1. Moves all 4s, follows commands HEENT:  Ventric drain in place no erythema, pus Cardiovascular:  RRR s M Lungs: Clear Abdomen: Soft, NT, ND and hypoactive BS. Ext: no edema, left brachial, small nodule, not red, not painful, no dc  LABS:  PULMONARY No results found for this basename: PHART, PCO2, PCO2ART, PO2, PO2ART, HCO3, TCO2, O2SAT,   in the last 168 hours CBC  Recent Labs Lab 10/17/12 0415 10/18/12 0555 10/19/12 0350  HGB 13.7 13.1 13.2  HCT 37.6* 36.2* 36.5*  WBC 10.8* 11.0* 11.1*  PLT 172 161 183   COAGULATION No results found for this basename: INR,  in the last 168 hours CARDIAC   No results found for this basename: TROPONINI,  in the last 168 hours No results found for this basename: PROBNP,  in the last 168 hours  CHEMISTRY  Recent Labs Lab 10/14/12 0435  10/15/12 0548  10/16/12 0500  10/17/12 0415  10/17/12 2255 10/18/12 0555 10/18/12 1033 10/18/12 2205 10/19/12 0350  NA 130*  < > 137  < > 133*  < > 133*  < > 133* 133* 133* 132* 132*  K 4.0  < > 4.0  --  3.5  --  3.9  --   --  3.5  --   --  3.7  CL 102  < > 107  --  102  --  103  --   --  105  --   --  102  CO2 15*  < > 16*  --  19  --  18*  --   --  17*  --   --  17*  GLUCOSE 184*  < > 131*  --  115*  --  102*  --   --  96  --   --  100*  BUN 17  < > 22  --  23  --  24*  --   --  20  --   --  16  CREATININE 0.95  < > 0.97  --  1.01  --  0.97  --   --  0.90  --   --  0.78  CALCIUM 8.5  < > 8.2*  --  8.1*  --  8.1*  --   --  7.4*  --   --  8.0*  MG 2.1  --   --   --  2.1  --  2.3  --   --  2.2  --   --  2.1  PHOS 3.4  --   --   --  3.0  --  2.8  --   --  2.7  --   --  2.9  < > = values in this interval not displayed. Estimated Creatinine Clearance: 122 ml/min (by C-G formula based on Cr of 0.78).  LIVER No results found for this basename: AST, ALT, ALKPHOS, BILITOT, PROT, ALBUMIN, INR,  in the last 168 hours INFECTIOUS No results found for this basename: LATICACIDVEN, PROCALCITON,  in the last 168 hours  ENDOCRINE CBG (last 3)   Recent Labs  10/18/12 1625 10/18/12 2201 10/19/12 0829  GLUCAP 119* 103* 96    CXR: NNF  ASSESSMENT / PLAN: Principal Problem:   SAH (subarachnoid hemorrhage) Active Problems:   Altered mental status   Acute respiratory failure with hypoxia  PULMONARY A: Resp failure, resolved P: - IS per  RT.  CARDIOVASCULAR A:  No acute issues P: - BP control as ordered.  RENAL A: Hyponatremia in setting of SAH R/o SIADH vs CSW vs iatrogenic.  Na continues to drop, U NA is 117 lower than serum.  Not likely SIADH, CSW is also unlikely given that urine Na is lower than serum.  More likely iatrogenic. P: - BMET in AM. - Replace K. - Urine Na 117, Urine osm 715, unlikely SIADH or CSW. - No free water. - Increased fludrocortisone to 0.2 mg per day without significant improvement in Na, recommend renal consultation  GASTROINTESTINAL A: Early ileus P: - Continue diet. - QT of 0.3, continue reglan but change to PO.  HEMATOLOGIC A:   Thrombocytopenia, no acute bleeding P: - Monitor CBC intermittently.  INFECTIOUS A: Low grade fever overnight but WBC remains stable (100.8). R/o csf, r/o septic thrombophlebitits P: - May need doppler arm. - D/Ced abx on 9/26, will continue to monitor.  ENDOCRINE A:   Mild hyperglycemia without prior hx of DM P:   - ISS and CBG.  NEUROLOGIC A:  SAH  P:   - Mgmt per NS. - See renal. - Clinically appears well. - BP control per NS.  Alyson Reedy, M.D. [facility] Pulmonary/Critical Care Medicine. Pager: [phone]. After hours pager: [phone].

## 2012-10-20 LAB — GLUCOSE, CAPILLARY
Glucose-Capillary: 122 mg/dL — ABNORMAL HIGH (ref 70–99)
Glucose-Capillary: 97 mg/dL (ref 70–99)
Glucose-Capillary: 105 mg/dL — ABNORMAL HIGH (ref 70–99)

## 2012-10-20 LAB — BASIC METABOLIC PANEL
BUN: 15 mg/dL (ref 6–23)
CO2: 19 mEq/L (ref 19–32)
Calcium: 8.3 mg/dL — ABNORMAL LOW (ref 8.4–10.5)
Chloride: 102 mEq/L (ref 96–112)
GFR calc Af Amer: >90 mL/min (ref >90)
GFR calc non Af Amer: >90 mL/min (ref >90)
Glucose, Bld: 111 mg/dL — ABNORMAL HIGH (ref 70–99)
Potassium: 4 mEq/L (ref 3.5–5.1)
Sodium: 132 mEq/L — ABNORMAL LOW (ref 135–145)

## 2012-10-20 LAB — CBC
Hemoglobin: 13.3 g/dL (ref 13.0–17.0)
MCH: 34 pg (ref 26.0–34.0)
MCHC: 36.3 g/dL — ABNORMAL HIGH (ref 30.0–36.0)
MCV: 93.6 fL (ref 78.0–100.0)
RDW: 12.2 % (ref 11.5–15.5)
WBC: 9.9 10*3/uL (ref 4.0–10.5)

## 2012-10-20 LAB — SODIUM: Sodium: 132 mEq/L — ABNORMAL LOW (ref 135–145)

## 2012-10-20 NOTE — Progress Notes (Signed)
Pt seen and examined. No issues overnight.  EXAM: Temp:  [97.9 F (36.6 C)-99.3 F (37.4 C)] 98.7 F (37.1 C) (09/30 0800) Pulse Rate:  [76-114] 100 (09/30 0800) Resp:  [19-30] 24 (09/30 0800) BP: (120-142)/(69-103) 121/73 mmHg (09/30 0800) SpO2:  [94 %-100 %] 97 % (09/30 0800) Intake/Output     09/29 0701 - 09/30 0700 09/30 0701 - 10/01 0700   P.O. 150    I.V. (mL/kg) 3000 (32.4) 125 (1.3)   Total Intake(mL/kg) 3150 (34) 125 (1.3)   Urine (mL/kg/hr) 2300 (1) 525 (4.5)   Drains 83.5 (0) 2 (0)   Total Output 2383.5 527   Net +766.5 -402        Urine Occurrence 3 x    Stool Occurrence 1 x     EVD in place, open at , 83cc x 24 hrs. Awake, alert, oriented to person, place, year CN grossly intact Follows commands BUE/BLE, no drift  LABS: Lab Results  Component Value Date   CREATININE 0.82 10/20/2012   BUN 15 10/20/2012   NA 132* 10/20/2012   K 4.0 10/20/2012   CL 102 10/20/2012   CO2 19 10/20/2012   Lab Results  Component Value Date   WBC 9.9 10/20/2012   HGB 13.3 10/20/2012   HCT 36.6* 10/20/2012   MCV 93.6 10/20/2012   PLT 200 10/20/2012    IMAGING: TCD velocities reviewed, bilateral MCA velocities trending down, still slightly elevated.  IMPRESSION: - 52 y.o. 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

## 2012-10-20 NOTE — Progress Notes (Signed)
PULMONARY  / CRITICAL CARE MEDICINE  Name: Richard Davenport MRN: [id number] DOB: 1960/08/16    ADMISSION DATE:  10/08/2012 CONSULTATION DATE:  10/09/2012  REFERRING MD :  Dr. Conchita Paris PRIMARY SERVICE: Neurosurgery  CHIEF COMPLAINT:  SAH  BRIEF PATIENT DESCRIPTION:  Admitted via ED 9/18 with SAH. Underwent coiling. Self extubated 9/20  SIGNIFICANT EVENTS / STUDIES:  9/18 CT head: SAH with ventriculomegaly 9/18: Diagnostic Angiogram. Coiling of basilar aneurysm. Placement of External ventricular drain 9/19 Right frontal ventriculostomy placement 9/19 CT head: No significant interval change in extensive subarachnoid hemorrhage within the basilar cisterns 9/23- fever, abx started  LINES / TUBES: ETT 9/19 >> 9/20 (self extubated)  CULTURES: 9/23 blood>>> 9/23 CSF>>>  ANTIBIOTICS: vanc 9/23>>>9/26 cefraixone 9/23>>>9/26  SUBJECTIVE/OVERNIGHT/INTERVAL HX N/V overnight, AXR with early ileus.  PHYSICAL EXAM  VITAL SIGNS: Temp:  [97.9 F (36.6 C)-99.3 F (37.4 C)] 98.7 F (37.1 C) (09/30 0800) Pulse Rate:  [86-114] 100 (09/30 0800) Resp:  [23-30] 24 (09/30 0800) BP: (120-142)/(69-103) 121/73 mmHg (09/30 0800) SpO2:  [96 %-100 %] 97 % (09/30 0800)  HEMODYNAMICS:   VENTILATOR SETTINGS:   INTAKE / OUTPUT: Intake/Output     09/29 0701 - 09/30 0700 09/30 0701 - 10/01 0700   P.O. 150    I.V. (mL/kg) 3000 (32.4) 125 (1.3)   Total Intake(mL/kg) 3150 (34) 125 (1.3)   Urine (mL/kg/hr) 2300 (1) 525 (2)   Drains 83.5 (0) 2 (0)   Total Output 2383.5 527   Net +766.5 -402        Urine Occurrence 3 x    Stool Occurrence 1 x     PHYSICAL EXAMINATION: General: No distress Neuro:  RASS +1. Moves all 4s, follows commands HEENT:  Ventric drain in place no erythema, pus Cardiovascular:  RRR s M Lungs: Clear Abdomen: Soft, NT, ND and hypoactive BS. Ext: no edema, left brachial, small nodule, not red, not painful, no dc  LABS:  PULMONARY No results found for this basename: PHART,  PCO2, PCO2ART, PO2, PO2ART, HCO3, TCO2, O2SAT,  in the last 168 hours CBC  Recent Labs Lab 10/18/12 0555 10/19/12 0350 10/20/12 0410  HGB 13.1 13.2 13.3  HCT 36.2* 36.5* 36.6*  WBC 11.0* 11.1* 9.9  PLT 161 183 200   COAGULATION No results found for this basename: INR,  in the last 168 hours CARDIAC   No results found for this basename: TROPONINI,  in the last 168 hours No results found for this basename: PROBNP,  in the last 168 hours  CHEMISTRY  Recent Labs Lab 10/16/12 0500  10/17/12 0415  10/18/12 0555  10/18/12 2205 10/19/12 0350 10/19/12 1122 10/19/12 2224 10/20/12 0410  NA 133*  < > 133*  < > 133*  < > 132* 132* 132* 134* 132*  K 3.5  --  3.9  --  3.5  --   --  3.7  --   --  4.0  CL 102  --  103  --  105  --   --  102  --   --  102  CO2 19  --  18*  --  17*  --   --  17*  --   --  19  GLUCOSE 115*  --  102*  --  96  --   --  100*  --   --  111*  BUN 23  --  24*  --  20  --   --  16  --   --  15  CREATININE 1.01  --  0.97  --  0.90  --   --  0.78  --   --  0.82  CALCIUM 8.1*  --  8.1*  --  7.4*  --   --  8.0*  --   --  8.3*  MG 2.1  --  2.3  --  2.2  --   --  2.1  --   --  2.0  PHOS 3.0  --  2.8  --  2.7  --   --  2.9  --   --  3.2  < > = values in this interval not displayed. Estimated Creatinine Clearance: 119.1 ml/min (by C-G formula based on Cr of 0.82).  LIVER No results found for this basename: AST, ALT, ALKPHOS, BILITOT, PROT, ALBUMIN, INR,  in the last 168 hours INFECTIOUS No results found for this basename: LATICACIDVEN, PROCALCITON,  in the last 168 hours  ENDOCRINE CBG (last 3)   Recent Labs  10/19/12 1223 10/19/12 1719 10/19/12 2147  GLUCAP 97 104* 126*    CXR: NNF  ASSESSMENT / PLAN: Principal Problem:   SAH (subarachnoid hemorrhage) Active Problems:   Altered mental status   Acute respiratory failure with hypoxia  PULMONARY A: Resp failure, resolved P: - IS per RT.  CARDIOVASCULAR A:  No acute issues P: - BP control as  ordered.  RENAL A: Hyponatremia in setting of SAH R/o SIADH vs CSW vs iatrogenic.  Na continues to drop, U NA is 117 lower than serum.  Not likely SIADH, CSW is also unlikely given that urine Na is lower than serum.  More likely iatrogenic. P: - BMET in AM. - Replace K. - Urine Na 117, Urine osm 715, unlikely SIADH or CSW. - No free water. - Increased fludrocortisone to 0.2 mg per day without significant improvement in Na, will continue to follow.  GASTROINTESTINAL A: Early ileus P: - Continue diet. - QT of 0.3, continue reglan but change to PO.  HEMATOLOGIC A:   Thrombocytopenia, no acute bleeding P: - Monitor CBC intermittently.  INFECTIOUS A: Low grade fever overnight but WBC remains stable (100.8). R/o csf, r/o septic thrombophlebitits P: - May need doppler arm. - D/Ced abx on 9/26, will continue to monitor.  ENDOCRINE A:   Mild hyperglycemia without prior hx of DM P:   - ISS and CBG.  NEUROLOGIC A:  SAH  P:   - Mgmt per NS. - See neuro, raising EVD to 20, if CT is acceptable will work towards taking out ventric. - Clinically appears well. - BP control per NS.  Alyson Reedy, M.D. [facility] Pulmonary/Critical Care Medicine. Pager: [phone]. After hours pager: [phone].

## 2012-10-20 NOTE — Progress Notes (Signed)
IVC drain raised to 20 mmHg per MD order  Holly Bodily

## 2012-10-21 ENCOUNTER — Inpatient Hospital Stay (HOSPITAL_COMMUNITY): Payer: Medicaid Other

## 2012-10-21 LAB — SODIUM
Sodium: 132 mEq/L — ABNORMAL LOW (ref 135–145)
Sodium: 132 mEq/L — ABNORMAL LOW (ref 135–145)

## 2012-10-21 LAB — GLUCOSE, CAPILLARY

## 2012-10-21 MED ORDER — ACETAMINOPHEN 160 MG/5ML PO SOLN: 650.0000 mg | Freq: Four times a day (QID) | ORAL | Status: DC | PRN

## 2012-10-21 MED ORDER — ACETAMINOPHEN 650 MG RE SUPP: 650.0000 mg | Freq: Four times a day (QID) | RECTAL | Status: DC | PRN

## 2012-10-21 NOTE — Progress Notes (Signed)
PULMONARY  / CRITICAL CARE MEDICINE  Name: Richard Davenport MRN: [id number] DOB: April 09, 1960    ADMISSION DATE:  10/08/2012 CONSULTATION DATE:  10/09/2012  REFERRING MD :  Dr. Conchita Paris PRIMARY SERVICE: Neurosurgery  CHIEF COMPLAINT:  SAH  BRIEF PATIENT DESCRIPTION:  Admitted via ED 9/18 with SAH. Underwent coiling. Self extubated 9/20  SIGNIFICANT EVENTS / STUDIES:  9/18 CT head: SAH with ventriculomegaly 9/18: Diagnostic Angiogram. Coiling of basilar aneurysm. Placement of External ventricular drain 9/19 Right frontal ventriculostomy placement 9/19 CT head: No significant interval change in extensive subarachnoid hemorrhage within the basilar cisterns 9/23- fever, abx started  LINES / TUBES: ETT 9/19 >> 9/20 (self extubated)  CULTURES: 9/23 blood>>> 9/23 CSF>>>  ANTIBIOTICS: vanc 9/23>>>9/26 cefraixone 9/23>>>9/26  SUBJECTIVE/OVERNIGHT/INTERVAL HX N/V overnight, AXR with early ileus.  PHYSICAL EXAM  VITAL SIGNS: Temp:  [97.9 F (36.6 C)-99.3 F (37.4 C)] 97.9 F (36.6 C) (10/01 0834) Pulse Rate:  [75-103] 102 (10/01 0900) Resp:  [13-29] 29 (10/01 0900) BP: (112-134)/(64-90) 114/68 mmHg (10/01 0900) SpO2:  [94 %-99 %] 97 % (10/01 0900)  HEMODYNAMICS:   VENTILATOR SETTINGS:   INTAKE / OUTPUT: Intake/Output     09/30 0701 - 10/01 0700 10/01 0701 - 10/02 0700   P.O. 50    I.V. (mL/kg) 2750 (29.7) 250 (2.7)   Total Intake(mL/kg) 2800 (30.2) 250 (2.7)   Urine (mL/kg/hr) 2775 (1.2)    Drains 36 (0) 6 (0)   Total Output 2811 6   Net -11 +244        Urine Occurrence 4 x     PHYSICAL EXAMINATION: General: No distress Neuro:  RASS +1. Moves all 4s, follows commands HEENT:  Ventric drain in place no erythema, pus Cardiovascular:  RRR s M Lungs: Clear Abdomen: Soft, NT, ND and hypoactive BS. Ext: no edema, left brachial, small nodule, not red, not painful, no dc  LABS:  PULMONARY No results found for this basename: PHART, PCO2, PCO2ART, PO2, PO2ART, HCO3, TCO2,  O2SAT,  in the last 168 hours CBC  Recent Labs Lab 10/18/12 0555 10/19/12 0350 10/20/12 0410  HGB 13.1 13.2 13.3  HCT 36.2* 36.5* 36.6*  WBC 11.0* 11.1* 9.9  PLT 161 183 200   COAGULATION No results found for this basename: INR,  in the last 168 hours CARDIAC   No results found for this basename: TROPONINI,  in the last 168 hours No results found for this basename: PROBNP,  in the last 168 hours  CHEMISTRY  Recent Labs Lab 10/16/12 0500  10/17/12 0415  10/18/12 0555  10/19/12 0350 10/19/12 1122 10/19/12 2224 10/20/12 0410 10/20/12 1051 10/20/12 2350  NA 133*  < > 133*  < > 133*  < > 132* 132* 134* 132* 132* 132*  K 3.5  --  3.9  --  3.5  --  3.7  --   --  4.0  --   --   CL 102  --  103  --  105  --  102  --   --  102  --   --   CO2 19  --  18*  --  17*  --  17*  --   --  19  --   --   GLUCOSE 115*  --  102*  --  96  --  100*  --   --  111*  --   --   BUN 23  --  24*  --  20  --  16  --   --  15  --   --   CREATININE 1.01  --  0.97  --  0.90  --  0.78  --   --  0.82  --   --   CALCIUM 8.1*  --  8.1*  --  7.4*  --  8.0*  --   --  8.3*  --   --   MG 2.1  --  2.3  --  2.2  --  2.1  --   --  2.0  --   --   PHOS 3.0  --  2.8  --  2.7  --  2.9  --   --  3.2  --   --   < > = values in this interval not displayed. Estimated Creatinine Clearance: 119.1 ml/min (by C-G formula based on Cr of 0.82).  LIVER No results found for this basename: AST, ALT, ALKPHOS, BILITOT, PROT, ALBUMIN, INR,  in the last 168 hours INFECTIOUS No results found for this basename: LATICACIDVEN, PROCALCITON,  in the last 168 hours  ENDOCRINE CBG (last 3)   Recent Labs  10/20/12 1706 10/20/12 2214 10/21/12 0833  GLUCAP 97 105* 107*   CXR: NNF  ASSESSMENT / PLAN: Principal Problem:   SAH (subarachnoid hemorrhage) Active Problems:   Altered mental status   Acute respiratory failure with hypoxia  PULMONARY A: Resp failure, resolved P: - IS per RT.  CARDIOVASCULAR A:  No acute  issues P: - BP control as ordered.  RENAL A: Hyponatremia in setting of SAH R/o SIADH vs CSW vs iatrogenic.  Na continues to drop, U NA is 117 lower than serum.  Not likely SIADH, CSW is also unlikely given that urine Na is lower than serum.  More likely iatrogenic. P: - BMET in AM. - Replace K. - Urine Na 117, Urine osm 715, unlikely SIADH or CSW. - No free water. - Increased fludrocortisone to 0.2 mg per day without significant improvement in Na, will continue to follow.  GASTROINTESTINAL A: Early ileus P: - Continue diet. - QT of 0.3, continue reglan but change to PO.  HEMATOLOGIC A:   Thrombocytopenia, no acute bleeding P: - Monitor CBC intermittently.  INFECTIOUS A: Low grade fever overnight but WBC remains stable (100.8). R/o csf, r/o septic thrombophlebitits P: - May need doppler arm. - D/Ced abx on 9/26, will continue to monitor.  ENDOCRINE A:   Mild hyperglycemia without prior hx of DM P:   - ISS and CBG.  NEUROLOGIC A:  SAH  P:   - Mgmt per NS. - Clamp ventric and CT today with consideration d/c of ventric in AM. - Clinically appears well. - BP control per NS.  Alyson Reedy, M.D. [facility] Pulmonary/Critical Care Medicine. Pager: [phone]. After hours pager: [phone].

## 2012-10-21 NOTE — Progress Notes (Signed)
INITIAL NUTRITION ASSESSMENT  DOCUMENTATION CODES Per approved criteria  -Not Applicable   INTERVENTION: Ensure Complete po BID, each supplement provides 350 kcal and 13 grams of protein.  NUTRITION DIAGNOSIS: Inadequate oral intake related to decreased appetite as evidenced by meal completion <25%.   Goal: Pt to meet >/= 90% of their estimated nutrition needs   Monitor:  PO intake, supplement acceptance, weight trend, labs   Reason for Assessment: Rounds  52 y.o. male  Admitting Dx: SAH (subarachnoid hemorrhage)  ASSESSMENT: Pt admitted with [facility] s/p basilar apex aneurysm coiling. Pt with EVD in place, clamped yesterday.  Pt discussed during ICU rounds and with RN.  Per RN pt has ate poorly during this admission. Pt only ate bites yesterday. Breakfast at bedside untouched. Pt very sleepy, unable to stay awake. Per RN pt with headache this am and received pain medication and is now sleepy.  Per pt he had no nutrition issues PTA.  Family not in room, per RN wife visits around 5:30 after work. Wife has brought pt food from home but pt still not eating.   Nutrition Focused Physical Exam:  Subcutaneous Fat:  Orbital Region: WNL Upper Arm Region: WNL Thoracic and Lumbar Region: WNL  Muscle:  Temple Region: WNL Clavicle Bone Region: WNL Clavicle and Acromion Bone Region: WNL Scapular Bone Region: WNL Dorsal Hand: WNL Patellar Region: WNL Anterior Thigh Region: WNL Posterior Calf Region: WNL  Edema: not present   Height: Ht Readings from Last 1 Encounters:  10/09/12 5\' 8"  (1.727 m)    Weight: Wt Readings from Last 1 Encounters:  10/18/12 204 lb 5.9 oz (92.7 kg)  Admission weight 194 lb 9/19 (88 kg)  Ideal Body Weight: 70 kg   % Ideal Body Weight: 132%  Wt Readings from Last 10 Encounters:  10/18/12 204 lb 5.9 oz (92.7 kg)  10/18/12 204 lb 5.9 oz (92.7 kg)    Usual Body Weight: unknown  % Usual Body Weight: -  BMI:  29.5   Estimated Nutritional  Needs: Kcal: 2000-2200 Protein: 100-115 grams Fluid: > 2 L/day  Skin: head incision  Diet Order: Vegetarian  EDUCATION NEEDS: -No education needs identified at this time   Intake/Output Summary (Last 24 hours) at 10/21/12 1107 Last data filed at 10/21/12 0900  Gross per 24 hour  Intake   2550 ml  Output   1858 ml  Net    692 ml    Last BM: 9/30   Labs:   Recent Labs Lab 10/18/12 0555  10/19/12 0350  10/20/12 0410 10/20/12 1051 10/20/12 2350  NA 133*  < > 132*  < > 132* 132* 132*  K 3.5  --  3.7  --  4.0  --   --   CL 105  --  102  --  102  --   --   CO2 17*  --  17*  --  19  --   --   BUN 20  --  16  --  15  --   --   CREATININE 0.90  --  0.78  --  0.82  --   --   CALCIUM 7.4*  --  8.0*  --  8.3*  --   --   MG 2.2  --  2.1  --  2.0  --   --   PHOS 2.7  --  2.9  --  3.2  --   --   GLUCOSE 96  --  100*  --  111*  --   --   < > =  values in this interval not displayed.  CBG (last 3)   Recent Labs  10/20/12 1706 10/20/12 2214 10/21/12 0833  GLUCAP 97 105* 107*    Scheduled Meds: . fludrocortisone  0.2 mg Oral Daily  . insulin aspart  0-9 Units Subcutaneous TID WC  . levETIRAcetam  500 mg Oral BID  . metoCLOPramide  5 mg Oral Q8H  . niMODipine  60 mg Oral Q4H  . pantoprazole  40 mg Oral QHS  . senna-docusate  1 tablet Oral BID  . simvastatin  80 mg Oral q1800    Continuous Infusions: . sodium chloride 125 mL/hr at 10/21/12 0900    Past Medical History  Diagnosis Date  . Headache     Past Surgical History  Procedure Laterality Date  . Radiology with anesthesia N/A 10/09/2012    Procedure: RADIOLOGY WITH ANESTHESIA;  Surgeon: Lisbeth Renshaw, MD;  Location: [facility] OR;  Service: Radiology;  Laterality: N/A;    Kendell Bane RD, LDN, CNSC [phone] Pager [phone] After Hours Pager

## 2012-10-21 NOTE — Progress Notes (Signed)
Pt pulled IV out of Right Hand.  When I asked pt why he pulled out his IV he told me "Because I just wanted it to be hanging there."  Pt is confused and I will continue to monitor.   IV team has been paged.

## 2012-10-21 NOTE — Progress Notes (Signed)
Transcranial Doppler  Date POD PCO2 HCT BP  MCA ACA PCA OPHT SIPH VERT Basilar  10/12/12 MS     Right  Left   96  28   -34  -24   -24  -37   34  27   -31  29   -46  -25     -48    9-24- 14 SB     Right  Left   141  117   -35  -45   23  --   44  33   34  37   -29  -39     -69    9-26- 14 SB     Right  Left   163  127   -31  -96   -42  25   25  16    34  33   -31  -21     -47    9-29- 14 SB      Right  Left   125  90   -67  -40   75/37  21/37   34  37   26  28   -27  -30     -72     10-21-2012 VS     Right  Left   86  27   -70  -35   69  12   32  25   35  40   -35  -56   -60           Right  Left                                            Right  Left                                        MCA = Middle Cerebral Artery      OPHT = Opthalmic Artery     BASILAR = Basilar Artery   ACA = Anterior Cerebral Artery     SIPH = Carotid Siphon PCA = Posterior Cerebral Artery   VERT = Verterbral Artery                   Normal MCA = 62+\-12 ACA = 50+\-12 PCA = 42+\-23     10/21/2012 6:00 PM [employer] [facility]

## 2012-10-21 NOTE — Progress Notes (Signed)
Pt seen and examined. No issues overnight x pt removed IV during period of mild confusion. He has no c/o this am.  EXAM: Temp:  [97.9 F (36.6 C)-99.3 F (37.4 C)] 97.9 F (36.6 C) (10/01 0834) Pulse Rate:  [75-103] 89 (10/01 0700) Resp:  [13-29] 24 (10/01 0700) BP: (112-134)/(64-90) 125/69 mmHg (10/01 0700) SpO2:  [94 %-99 %] 94 % (10/01 0700) Intake/Output     09/30 0701 - 10/01 0700 10/01 0701 - 10/02 0700   P.O. 50    I.V. (mL/kg) 2750 (29.7)    Total Intake(mL/kg) 2800 (30.2)    Urine (mL/kg/hr) 2775 (1.2)    Drains 36 (0)    Total Output 2811     Net -11          Urine Occurrence 4 x     EVD in place, open at 36cc x 24hrs Awake, alert, oriented Speech fluent CN grossly intact Follows commands BUE/BLE, no drift  LABS: Lab Results  Component Value Date   CREATININE 0.82 10/20/2012   BUN 15 10/20/2012   NA 132* 10/20/2012   K 4.0 10/20/2012   CL 102 10/20/2012   CO2 19 10/20/2012   Lab Results  Component Value Date   WBC 9.9 10/20/2012   HGB 13.3 10/20/2012   HCT 36.6* 10/20/2012   MCV 93.6 10/20/2012   PLT 200 10/20/2012    IMAGING: No new imaging overnight  IMPRESSION: - 52 y.o. male SAH d# 13 s/p basilar apex aneurysm coiling - Neurologically stable with EVD at 20, no clinical spasm  PLAN: - Cont close observation - CTH today as baseline, then clamp EVD - TCD today, cont Nimotop/Zocor - Mild hyponatremia stable, mgmt per CCM - Afebrile, off Abx - Regular diet, OOB to chair

## 2012-10-22 ENCOUNTER — Inpatient Hospital Stay (HOSPITAL_COMMUNITY): Payer: Medicaid Other

## 2012-10-22 ENCOUNTER — Encounter (HOSPITAL_COMMUNITY): Payer: Self-pay | Admitting: Radiology

## 2012-10-22 LAB — MAGNESIUM: Magnesium: 1.8 mg/dL (ref 1.5–2.5)

## 2012-10-22 LAB — CBC
Hemoglobin: 13.1 g/dL (ref 13.0–17.0)
MCHC: 36.9 g/dL — ABNORMAL HIGH (ref 30.0–36.0)
MCV: 93.9 fL (ref 78.0–100.0)
Platelets: 205 10*3/uL (ref 150–400)
RBC: 3.78 MIL/uL — ABNORMAL LOW (ref 4.22–5.81)
WBC: 8.6 10*3/uL (ref 4.0–10.5)

## 2012-10-22 LAB — GLUCOSE, CAPILLARY
Glucose-Capillary: 117 mg/dL — ABNORMAL HIGH (ref 70–99)
Glucose-Capillary: 235 mg/dL — ABNORMAL HIGH (ref 70–99)

## 2012-10-22 LAB — BASIC METABOLIC PANEL
BUN: 12 mg/dL (ref 6–23)
CO2: 19 mEq/L (ref 19–32)
Calcium: 7.7 mg/dL — ABNORMAL LOW (ref 8.4–10.5)
Chloride: 102 mEq/L (ref 96–112)
Glucose, Bld: 98 mg/dL (ref 70–99)
Sodium: 131 mEq/L — ABNORMAL LOW (ref 135–145)

## 2012-10-22 LAB — SODIUM: Sodium: 133 mEq/L — ABNORMAL LOW (ref 135–145)

## 2012-10-22 LAB — PHOSPHORUS: Phosphorus: 2.6 mg/dL (ref 2.3–4.6)

## 2012-10-22 MED ORDER — POTASSIUM PHOSPHATE DIBASIC 3 MMOLE/ML IV SOLN
30.0000 mmol | Freq: Once | INTRAVENOUS | Status: AC
  Administered 2012-10-22: 30 mmol via INTRAVENOUS
  Filled 2012-10-22: qty 10

## 2012-10-22 MED ORDER — DEXAMETHASONE SODIUM PHOSPHATE 10 MG/ML IJ SOLN
10.0000 mg | Freq: Once | INTRAMUSCULAR | Status: AC
  Administered 2012-10-22: 10 mg via INTRAVENOUS
  Filled 2012-10-22: qty 1

## 2012-10-22 MED ORDER — MAGNESIUM SULFATE 40 MG/ML IJ SOLN
2.0000 g | Freq: Once | INTRAMUSCULAR | Status: AC
  Administered 2012-10-22: 2 g via INTRAVENOUS
  Filled 2012-10-22: qty 50

## 2012-10-22 MED ORDER — ENSURE COMPLETE PO LIQD
237.0000 mL | Freq: Two times a day (BID) | ORAL | Status: DC
  Administered 2012-10-22 – 2012-10-27 (×11): 237 mL via ORAL

## 2012-10-22 NOTE — Progress Notes (Signed)
Pts EVD removed at bedside without immediate complication. 3 stapes placed at tunneled exit site for closure. Pt tolerated procedure well.

## 2012-10-22 NOTE — Progress Notes (Signed)
Pt seen and examined. No issues overnight. Pt now c/o frontal HA.  EXAM: Temp:  [98.4 F (36.9 C)-99 F (37.2 C)] 98.4 F (36.9 C) (10/02 1200) Pulse Rate:  [71-110] 84 (10/02 1400) Resp:  [13-24] 19 (10/02 1400) BP: (99-143)/(60-87) 129/77 mmHg (10/02 1400) SpO2:  [94 %-100 %] 96 % (10/02 1400) Intake/Output     10/01 0701 - 10/02 0700 10/02 0701 - 10/03 0700   P.O. 240 594   I.V. (mL/kg) 3000 (32.4) 875 (9.4)   IV Piggyback  305   Total Intake(mL/kg) 3240 (35) 1774 (19.1)   Urine (mL/kg/hr) 1900 (0.9) 2125 (3)   Drains 11 (0)    Total Output 1911 2125   Net +1329 -351        Urine Occurrence 2 x     EVD in place, clamped Awake, alert, oriented Speech fluent CN grossly intact Follows commands BUE/BLE, no drift  LABS: Lab Results  Component Value Date   CREATININE 0.75 10/22/2012   BUN 12 10/22/2012   NA 126* 10/22/2012   K 3.8 10/22/2012   CL 102 10/22/2012   CO2 19 10/22/2012   Lab Results  Component Value Date   WBC 8.6 10/22/2012   HGB 13.1 10/22/2012   HCT 35.5* 10/22/2012   MCV 93.9 10/22/2012   PLT 205 10/22/2012    IMAGING: - Repeat CTH after 24hrs EVD clamped demonstrates stable ventricle size. - TCD velocities reviewed, LMCA velocity normal, RMCA trending down.  IMPRESSION: - 52 y.o. male SAH d# 14 s/p basilar apex aneurysm coiling - Clinically and radiographically stable after 24hrs of EVD clamping - No clinical vasospasm  PLAN: - D/C EVD today - Cont Nimotop/Zocor - OOB - Hyponatremia unresponsive to Florinef. Would consider hypertonic saline in the short term.

## 2012-10-22 NOTE — Progress Notes (Signed)
UR completed.  Layton Tappan, RN BSN [facility] CCM Trauma/Neuro ICU Case Manager [phone]  

## 2012-10-22 NOTE — Progress Notes (Addendum)
PULMONARY  / CRITICAL CARE MEDICINE  Name: Richard Davenport MRN: [id number] DOB: February 03, 1960    ADMISSION DATE:  10/08/2012 CONSULTATION DATE:  10/09/2012  REFERRING MD :  Dr. Conchita Paris PRIMARY SERVICE: Neurosurgery  CHIEF COMPLAINT:  SAH  BRIEF PATIENT DESCRIPTION:  Admitted via ED 9/18 with SAH. Underwent coiling. Self extubated 9/20  SIGNIFICANT EVENTS / STUDIES:  9/18 CT head: SAH with ventriculomegaly 9/18: Diagnostic Angiogram. Coiling of basilar aneurysm. Placement of External ventricular drain 9/19 Right frontal ventriculostomy placement 9/19 CT head: No significant interval change in extensive subarachnoid hemorrhage within the basilar cisterns 9/23- fever, abx started  LINES / TUBES: ETT 9/19 >> 9/20 (self extubated)  CULTURES: 9/23 blood>>> 9/23 CSF>>>  ANTIBIOTICS: vanc 9/23>>>9/26 cefraixone 9/23>>>9/26  SUBJECTIVE/OVERNIGHT/INTERVAL HX N/V overnight, AXR with early ileus.  PHYSICAL EXAM  VITAL SIGNS: Temp:  [98 F (36.7 C)-99 F (37.2 C)] 98.8 F (37.1 C) (10/02 0800) Pulse Rate:  [68-106] 79 (10/02 0800) Resp:  [13-24] 21 (10/02 0800) BP: (99-142)/(60-89) 136/80 mmHg (10/02 0800) SpO2:  [94 %-100 %] 100 % (10/02 0800)  HEMODYNAMICS:   VENTILATOR SETTINGS:   INTAKE / OUTPUT: Intake/Output     10/01 0701 - 10/02 0700 10/02 0701 - 10/03 0700   P.O. 240    I.V. (mL/kg) 3000 (32.4)    Total Intake(mL/kg) 3240 (35)    Urine (mL/kg/hr) 1900 (0.9)    Drains 11 (0)    Total Output 1911     Net +1329          Urine Occurrence 2 x     PHYSICAL EXAMINATION: General: No distress Neuro:  RASS +1. Moves all 4s, follows commands HEENT:  Ventric drain in place no erythema, pus Cardiovascular:  RRR s M Lungs: Clear Abdomen: Soft, NT, ND and hypoactive BS. Ext: no edema, left brachial, small nodule, not red, not painful, no dc  LABS:  PULMONARY No results found for this basename: PHART, PCO2, PCO2ART, PO2, PO2ART, HCO3, TCO2, O2SAT,  in the last 168  hours CBC  Recent Labs Lab 10/19/12 0350 10/20/12 0410 10/22/12 0430  HGB 13.2 13.3 13.1  HCT 36.5* 36.6* 35.5*  WBC 11.1* 9.9 8.6  PLT 183 200 205   COAGULATION No results found for this basename: INR,  in the last 168 hours CARDIAC   No results found for this basename: TROPONINI,  in the last 168 hours No results found for this basename: PROBNP,  in the last 168 hours  CHEMISTRY  Recent Labs Lab 10/17/12 0415  10/18/12 0555  10/19/12 0350  10/20/12 0410 10/20/12 1051 10/20/12 2350 10/21/12 1120 10/21/12 2309 10/22/12 0430  NA 133*  < > 133*  < > 132*  < > 132* 132* 132* 132* 133* 131*  K 3.9  --  3.5  --  3.7  --  4.0  --   --   --   --  3.8  CL 103  --  105  --  102  --  102  --   --   --   --  102  CO2 18*  --  17*  --  17*  --  19  --   --   --   --  19  GLUCOSE 102*  --  96  --  100*  --  111*  --   --   --   --  98  BUN 24*  --  20  --  16  --  15  --   --   --   --  12  CREATININE 0.97  --  0.90  --  0.78  --  0.82  --   --   --   --  0.75  CALCIUM 8.1*  --  7.4*  --  8.0*  --  8.3*  --   --   --   --  7.7*  MG 2.3  --  2.2  --  2.1  --  2.0  --   --   --   --  1.8  PHOS 2.8  --  2.7  --  2.9  --  3.2  --   --   --   --  2.6  < > = values in this interval not displayed. Estimated Creatinine Clearance: 122 ml/min (by C-G formula based on Cr of 0.75).  LIVER No results found for this basename: AST, ALT, ALKPHOS, BILITOT, PROT, ALBUMIN, INR,  in the last 168 hours INFECTIOUS No results found for this basename: LATICACIDVEN, PROCALCITON,  in the last 168 hours  ENDOCRINE CBG (last 3)   Recent Labs  10/21/12 1657 10/21/12 2247 10/22/12 0817  GLUCAP 108* 93 96   CXR: NNF  ASSESSMENT / PLAN: Principal Problem:   SAH (subarachnoid hemorrhage) Active Problems:   Altered mental status   Acute respiratory failure with hypoxia  PULMONARY A: Resp failure, resolved P: - IS per RT.  CARDIOVASCULAR A:  No acute issues P: - BP control as  ordered.  RENAL A: Hyponatremia in setting of SAH R/o SIADH vs CSW vs iatrogenic.  Na dropping farther even with fludrocortisone and normal saline. Hypo K, Mg and Phos. P: - BMET in AM. - Replace K, Mg and Phos. - Urine Na 117, Urine osm 715, unlikely SIADH or CSW. - No free water. - Increased fludrocortisone to 0.2 mg per day, will continue, will be more interesting to see Na trend once NS can be discontinued and repeat urine studies to evaluate if CSW or SIADH as continuous infusion is influencing labs.  GASTROINTESTINAL A: Early ileus P: - Continue diet. - QT of 0.3, continue reglan but change to PO.  HEMATOLOGIC A:   Thrombocytopenia, no acute bleeding P: - Monitor CBC intermittently.  INFECTIOUS A: No further fever, will monitor for now. P: - D/Ced abx on 9/26, will continue to monitor. - Monitor WBC and fever curve.  ENDOCRINE A:   Mild hyperglycemia without prior hx of DM P:   - ISS and CBG.  NEUROLOGIC A:  SAH CT showed no hydrocephalus P:   - Mgmt per NS. - Clinically appears well, will defer to NS. - BP control per NS.  Alyson Reedy, M.D. [facility] Pulmonary/Critical Care Medicine. Pager: [phone]. After hours pager: [phone].

## 2012-10-23 LAB — BASIC METABOLIC PANEL
BUN: 13 mg/dL (ref 6–23)
CO2: 23 mEq/L (ref 19–32)
Chloride: 102 mEq/L (ref 96–112)
GFR calc Af Amer: >90 mL/min (ref >90)
GFR calc non Af Amer: >90 mL/min (ref >90)
GFR calc non Af Amer: >90 mL/min (ref >90)
Glucose, Bld: 115 mg/dL — ABNORMAL HIGH (ref 70–99)
Glucose, Bld: 120 mg/dL — ABNORMAL HIGH (ref 70–99)
Potassium: 3.7 mEq/L (ref 3.5–5.1)
Potassium: 3.5 mEq/L (ref 3.5–5.1)
Sodium: 132 mEq/L — ABNORMAL LOW (ref 135–145)
Sodium: 136 mEq/L (ref 135–145)

## 2012-10-23 LAB — GLUCOSE, CAPILLARY
Glucose-Capillary: 148 mg/dL — ABNORMAL HIGH (ref 70–99)
Glucose-Capillary: 116 mg/dL — ABNORMAL HIGH (ref 70–99)
Glucose-Capillary: 107 mg/dL — ABNORMAL HIGH (ref 70–99)

## 2012-10-23 LAB — CBC
HCT: 34.2 % — ABNORMAL LOW (ref 39.0–52.0)
Hemoglobin: 12.5 g/dL — ABNORMAL LOW (ref 13.0–17.0)
MCHC: 36.5 g/dL — ABNORMAL HIGH (ref 30.0–36.0)
Platelets: 197 10*3/uL (ref 150–400)
WBC: 8.5 10*3/uL (ref 4.0–10.5)

## 2012-10-23 LAB — PHOSPHORUS: Phosphorus: 2.7 mg/dL (ref 2.3–4.6)

## 2012-10-23 LAB — MAGNESIUM: Magnesium: 2.1 mg/dL (ref 1.5–2.5)

## 2012-10-23 MED ORDER — POTASSIUM PHOSPHATE DIBASIC 3 MMOLE/ML IV SOLN
30.0000 mmol | Freq: Once | INTRAVENOUS | Status: AC
  Administered 2012-10-23: 30 mmol via INTRAVENOUS
  Filled 2012-10-23: qty 10

## 2012-10-23 NOTE — Progress Notes (Signed)
Physical Therapy Evaluation Patient Details Name: Richard Davenport MRN: [id number] DOB: Apr 19, 1960 Today's Date: 10/23/2012 Time: 4098-1191 PT Time Calculation (min): 14 min  PT Assessment / Plan / Recommendation History of Present Illness  Pt is s/p SAH.   52 yo - 9/18 CT head: SAH with ventriculomegaly  9/18: Diagnostic Angiogram. Coiling of basilar aneurysm. Placement of External ventricular drain  9/19 Right frontal ventriculostomy placement  9/19 CT head: No significant interval change in extensive subarachnoid hemorrhage within the basilar cisterns  9/23- fever, abx started   Clinical Impression  Pt admitted with above. Pt currently with functional limitations due to the deficits listed below (see PT Problem List). Pt will benefit from CIR.   Pt will benefit from skilled PT to increase their independence and safety with mobility to allow discharge to the venue listed below.     PT Assessment  Patient needs continued PT services    Follow Up Recommendations  CIR;Supervision/Assistance - 24 hour                Equipment Recommendations  Other (comment) (TBA)    Recommendations for Other Services Rehab consult   Frequency Min 3X/week    Precautions / Restrictions Precautions Precautions: Fall Restrictions Weight Bearing Restrictions: No   Pertinent Vitals/Pain VSS, no pain      Mobility  Bed Mobility Bed Mobility: Sit to Supine Supine to Sit: 4: Min guard;HOB elevated Sit to Supine: 4: Min guard Transfers Transfers: Sit to Stand;Stand to Sit Sit to Stand: 3: Mod assist;From chair/3-in-1;With upper extremity assist Stand to Sit: 3: Mod assist;To bed Details for Transfer Assistance: vc for safety and hand placement Ambulation/Gait Ambulation/Gait Assistance: 4: Min assist Ambulation Distance (Feet): 5 Feet Assistive device: 2 person hand held assist Ambulation/Gait Assistance Details: Pt very unsteady with all movements.  On 3N1 on arrival.  NT in room.  Pt stood  and then proceeded to squat to floor and clean his bottom after standing from toilet. Steadied pt with min assist.  Once he stood he agreed only to ambulate back to bed.  Unsteady and impulsive overall.   Gait Pattern: Decreased stride length;Step-through pattern Stairs: No Wheelchair Mobility Wheelchair Mobility: No Modified Rankin (Stroke Patients Only) Pre-Morbid Rankin Score: No symptoms Modified Rankin: Moderately severe disability         PT Diagnosis: Generalized weakness  PT Problem List: Decreased activity tolerance;Decreased balance;Decreased mobility;Decreased knowledge of use of DME;Decreased safety awareness;Decreased knowledge of precautions PT Treatment Interventions: DME instruction;Gait training;Functional mobility training;Stair training;Therapeutic activities;Therapeutic exercise;Balance training;Patient/family education     PT Goals(Current goals can be found in the care plan section) Acute Rehab PT Goals Patient Stated Goal: go back to work PT Goal Formulation: With patient Time For Goal Achievement: 11/06/12 Potential to Achieve Goals: Good  Visit Information  Last PT Received On: 10/23/12 Assistance Needed: +2 History of Present Illness: Pt is s/p SAH.         Prior Functioning  Home Living Family/patient expects to be discharged to:: Private residence Living Arrangements: Spouse/significant other;Children Available Help at Discharge: Family;Available 24 hours/day (unsure - per pt report) Type of Home: Apartment Home Access: Level entry Home Layout: One level Home Equipment: None Additional Comments: unsure of info - per pt report Prior Function Level of Independence: Independent Comments: Curator Communication Communication: No difficulties Dominant Hand: Right    Cognition  Cognition Arousal/Alertness: Awake/alert Behavior During Therapy: WFL for tasks assessed/performed Overall Cognitive Status: Impaired/Different from baseline Area of  Impairment: Orientation;Attention;Memory;Following commands;Safety/judgement;Awareness;Problem solving Orientation  Level: Disoriented to;Place;Time;Situation Current Attention Level: Sustained Memory: Decreased recall of precautions;Decreased short-term memory Following Commands: Follows one step commands consistently Safety/Judgement: Decreased awareness of safety;Decreased awareness of deficits (poor balance and staes he can walk by himself) Awareness: Intellectual Problem Solving: Slow processing;Requires verbal cues General Comments: confabulating    Extremity/Trunk Assessment Upper Extremity Assessment Upper Extremity Assessment: Defer to OT evaluation Lower Extremity Assessment Lower Extremity Assessment: Generalized weakness Cervical / Trunk Assessment Cervical / Trunk Assessment: Normal   Balance Balance Balance Assessed: Yes Static Sitting Balance Static Sitting - Balance Support: No upper extremity supported;Feet supported Static Sitting - Level of Assistance: 5: Stand by assistance Static Standing Balance Static Standing - Balance Support: During functional activity Static Standing - Level of Assistance: 3: Mod assist;Other (comment) (R bias) Static Standing - Comment/# of Minutes: 4  End of Session PT - End of Session Equipment Utilized During Treatment: Gait belt Activity Tolerance: Patient limited by fatigue Patient left: in bed;with call bell/phone within reach;with restraints reapplied;with bed alarm set Nurse Communication: Mobility status       INGOLD,Neldon Shepard 10/23/2012, 11:40 AM [facility] Acute Rehabilitation [phone] (pager)

## 2012-10-23 NOTE — Progress Notes (Signed)
Occupational Therapy Evaluation Patient Details Name: Richard Davenport MRN: [id number] DOB: 06-17-1960 Today's Date: 10/23/2012 Time: 4098-1191 OT Time Calculation (min): 32 min  OT Assessment / Plan / Recommendation History of present illness  52 yo - 9/18 CT head: SAH with ventriculomegaly  9/18: Diagnostic Angiogram. Coiling of basilar aneurysm. 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 Pt presents with apparent cognitive and balance deficits and general weakness. Pt c/o dizziness with movement . Will further assess vestibular occular system.Pt would benefit from short CIR stay to increase independence and safety with ADL and functional mobility to facilitate D/C home with family. Pt will benefit from skilled OT services to facilitate D/C to next venue due to below deficits.    OT Assessment  Patient needs continued OT Services    Follow Up Recommendations  CIR    Barriers to Discharge      Equipment Recommendations  Tub/shower seat    Recommendations for Other Services Rehab consult  Frequency  Min 3X/week    Precautions / Restrictions Precautions Precautions: Fall   Pertinent Vitals/Pain HR increased to 132 for short period while grooming at sink. All other vitals stable    ADL  Eating/Feeding: Supervision/safety;Set up Where Assessed - Eating/Feeding: Chair Grooming: Minimal assistance Where Assessed - Grooming: Supported standing Upper Body Bathing: Minimal assistance Where Assessed - Upper Body Bathing: Supported standing Lower Body Bathing: Moderate assistance Where Assessed - Lower Body Bathing: Supported sit to stand Upper Body Dressing: Minimal  assistance Where Assessed - Upper Body Dressing: Supported sit to stand Lower Body Dressing: Moderate assistance Where Assessed - Lower Body Dressing: Supported sit to stand Toilet Transfer: +2 Total assistance (for line mgnt. LOB) Toilet Transfer: Patient Percentage: 70% Toilet Transfer Method: Other (comment) (ambulaitng) Toilet Transfer Equipment: Bedside commode Toileting - Clothing Manipulation and Hygiene: Supervision/safety Where Assessed - Toileting Clothing Manipulation and Hygiene: Sit to stand from 3-in-1 or toilet Equipment Used: Gait belt Transfers/Ambulation Related to ADLs: +2 HHA. scissoring gait at times. LOB.    OT Diagnosis: Generalized weakness;Cognitive deficits  OT Problem List: Decreased activity tolerance;Impaired balance (sitting and/or standing);Decreased cognition;Decreased safety awareness;Decreased knowledge of use of DME or AE;Decreased knowledge of precautions OT Treatment Interventions: Self-care/ADL training;Therapeutic exercise;Energy conservation;DME and/or AE instruction;Therapeutic activities;Cognitive remediation/compensation;Patient/family education;Balance training   OT Goals(Current goals can be found in the care plan section) Acute Rehab OT Goals Patient Stated Goal: go back to work OT Goal Formulation: Patient unable to participate in goal setting Time For Goal Achievement: 11/06/12 Potential to Achieve Goals: Good  Visit Information  Last OT Received On: 10/23/12 Assistance Needed: +2 PT/OT Co-Evaluation/Treatment:  (for ambulation and line mgnt)       Prior Functioning     Home Living Family/patient expects to be discharged to:: Private residence Living Arrangements: Spouse/significant other;Children Available Help at Discharge: Family;Available 24 hours/day (unsure - per pt report) Type of Home: Apartment Home Access: Level entry Home Layout: One level Home Equipment: None Additional Comments: unsure of info - per pt  report Prior Function Level of Independence: Independent Comments: Curator Communication Communication: No difficulties Dominant Hand: Right         Vision/Perception Vision - History Baseline Vision: Wears glasses only for reading Patient Visual Report:  No change from baseline Vision - Assessment Eye Alignment: Within Functional Limits Vision Assessment:  (will further assess) Perception Perception: Impaired (overshooting when reaching for objects) Praxis Praxis: Impaired Praxis Impairment Details: Perseveration (mild perseveration noted)   Cognition  Cognition Arousal/Alertness: Awake/alert Behavior During Therapy: WFL for tasks assessed/performed Overall Cognitive Status: Impaired/Different from baseline Area of Impairment: Orientation;Attention;Memory;Following commands;Safety/judgement;Awareness;Problem solving Orientation Level: Disoriented to;Place;Time;Situation Current Attention Level: Sustained Memory: Decreased recall of precautions;Decreased short-term memory Following Commands: Follows one step commands consistently Safety/Judgement: Decreased awareness of safety;Decreased awareness of deficits (poor balance and staes he can walk by himself) Awareness: Intellectual Problem Solving: Slow processing;Requires verbal cues General Comments: confabulating    Extremity/Trunk Assessment Upper Extremity Assessment Upper Extremity Assessment: Overall WFL for tasks assessed Lower Extremity Assessment Lower Extremity Assessment: Overall WFL for tasks assessed Cervical / Trunk Assessment Cervical / Trunk Assessment: Normal     Mobility Bed Mobility Bed Mobility: Supine to Sit Supine to Sit: 4: Min guard;HOB elevated Transfers Transfers: Sit to Stand;Stand to Sit Sit to Stand: 3: Mod assist;From bed Stand to Sit: 3: Mod assist;To chair/3-in-1 Details for Transfer Assistance: vc for safety and hand placement     Exercise     Balance Balance Balance  Assessed: Yes Static Sitting Balance Static Sitting - Balance Support: No upper extremity supported;Feet supported Static Sitting - Level of Assistance: 5: Stand by assistance Static Standing Balance Static Standing - Balance Support: During functional activity Static Standing - Level of Assistance: 3: Mod assist;Other (comment) (R bias) Static Standing - Comment/# of Minutes: 5 Balance affected by decreased attention. Pt c/o of feeling dizzy with movement. ? Vestibular occular involvement   End of Session OT - End of Session Equipment Utilized During Treatment: Gait belt Activity Tolerance: Patient tolerated treatment well Patient left: in chair;with call bell/phone within reach;with chair alarm set Nurse Communication: Mobility status  GO     Gildardo Tickner,HILLARY 10/23/2012, 10:37 AM Luisa Dago, OTR/L  [phone] 10/23/2012

## 2012-10-23 NOTE — Progress Notes (Addendum)
Transcranial Doppler  Date POD PCO2 HCT BP  MCA ACA PCA OPHT SIPH VERT Basilar  10/12/12 MS     Right  Left   96  28   -34  -24   -24  -37   34  27   -31  29   -46  -25     -48    9-24- 14 SB     Right  Left   141  117   -35  -45   23  --   44  33   34  37   -29  -39     -69    9-26- 14 SB     Right  Left   163  127   -31  -96   -42  25   25  16    34  33   -31  -21     -47    9-29- 14 SB      Right  Left   125  90   -67  -40   75/37  21/37   34  37   26  28   -27  -30     -72     10-21-2012 VS     Right  Left   86  27   -70  -35   69  12   32  25   35  40   -35  -56   -60      10-3- 14 SB     Right  Left   66  72   -67  -52   -56  30   23  26    69  88   -25  -20     -45         Right  Left                                        MCA = Middle Cerebral Artery      OPHT = Opthalmic Artery     BASILAR = Basilar Artery   ACA = Anterior Cerebral Artery     SIPH = Carotid Siphon PCA = Posterior Cerebral Artery   VERT = Verterbral Artery                   Normal MCA = 62+\-12 ACA = 50+\-12 PCA = 42+\-23     10/23/2012 11:22 AM [employer]

## 2012-10-23 NOTE — Consult Note (Signed)
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  Referring Physician:  Dr. Conchita Paris.    HPI: Richard Davenport is a 52 y.o. male admitted on 10/08/12 with unresponsiveness due to diffuse SAH in the basal cisterns and  bilateral sylvian fissures with some degree of intraventricular hemorrhage.   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 Conchita Paris.   Patient self extubated on 10/10/12 and was placed on regular diet as no evidence of dysphagia reported per ST evaluation. He was noted to be febrile on 09/23 and was started on empiric antibiotics. ID consulted due to continued fevers despite negative BC and CSF.  LUE duplex positive for  superficial vein thrombosis involving the left basilic vein, solely in the antecubital fossa. ID recommended discontinuing antibiotics as fevers felt to be central due to CNS. Hyponatremia treated with florinef as well as normal saline with improvement. Headaches as well as confusion is resolving.   Follow up CCT on 10/2 showed continued resolution of SAH, no cortical infarct or hydrocephalus. EVD removed on 10/02 and  PT, OT evaluations done today. Patient impulsive with poor safety as well as unsteady gait.  MD, PT, OT recommending CIR.     Review of Systems  HENT: Negative for hearing loss.   Eyes: Negative for blurred vision and double vision.  Respiratory: Negative for cough and shortness of breath.   Cardiovascular: Negative for chest pain and palpitations.  Gastrointestinal: Negative for heartburn, nausea and abdominal pain.  Genitourinary: Negative for urgency and frequency.  Musculoskeletal: Negative for myalgias, back pain and joint pain.  Neurological: Positive for headaches.   Past Medical History  Diagnosis Date  . Headache    Past Surgical History  Procedure  Laterality Date  . Radiology with anesthesia N/A 10/09/2012    Procedure: RADIOLOGY WITH ANESTHESIA;  Surgeon: Lisbeth Renshaw, MD;  Location: [facility];  Service: Radiology;  Laterality: N/A;   History reviewed. No pertinent family history.  Social History:  Married. Works as a Community education officer. Has four children ranging from 9 years to 20--wife works days in a factory.  He reports that he has never smoked. He has never used smokeless tobacco. He reports that he does not drink alcohol or use illicit drugs.   Allergies  Allergen Reactions  . Pork-Derived Products     Patient is Muslim and has requested no pork products   Medications Prior to Admission  Medication Sig Dispense Refill  . HYDROcodone-acetaminophen (NORCO/VICODIN) 5-325 MG per tablet Take 1 tablet by mouth every 6 (six) hours as needed for pain.        Home: Home Living Family/patient expects to be discharged to:: Private residence Living Arrangements: Spouse/significant other;Children Available Help at Discharge: Family;Available 24 hours/day (unsure - per pt report) Type of Home: Apartment Home Access: Level entry Home Layout: One level Home Equipment: None Additional Comments: unsure of info - per pt report  Functional History: Prior Function Comments: Curator Functional Status:  Mobility: Bed Mobility Bed Mobility: Sit to Supine Supine to Sit: 4: Min guard;HOB elevated Sit to Supine: 4: Min guard Transfers Transfers: Sit to Stand;Stand to Sit Sit to Stand: 3: Mod assist;From chair/3-in-1;With upper extremity assist Stand to Sit: 3: Mod assist;To bed Ambulation/Gait Ambulation/Gait Assistance: 4: Min assist Ambulation Distance (Feet): 5 Feet Assistive device: 2 person hand held assist Ambulation/Gait Assistance Details: Pt very unsteady  with all movements.  On 3N1 on arrival.  NT in room.  Pt stood and then proceeded to squat to floor and clean his bottom after standing from toilet. Steadied pt with min assist.   Once he stood he agreed only to ambulate back to bed.  Unsteady and impulsive overall.   Gait Pattern: Decreased stride length;Step-through pattern Stairs: No Wheelchair Mobility Wheelchair Mobility: No  ADL: ADL Eating/Feeding: Supervision/safety;Set up Where Assessed - Eating/Feeding: Chair Grooming: Minimal assistance Where Assessed - Grooming: Supported standing Upper Body Bathing: Minimal assistance Where Assessed - Upper Body Bathing: Supported standing Lower Body Bathing: Moderate assistance Where Assessed - Lower Body Bathing: Supported sit to stand Upper Body Dressing: Minimal assistance Where Assessed - Upper Body Dressing: Supported sit to stand Lower Body Dressing: Moderate assistance Where Assessed - Lower Body Dressing: Supported sit to stand Toilet Transfer: +2 Total assistance (for line mgnt. LOB) Toilet Transfer Method: Other (comment) (ambulaitng) Toilet Transfer Equipment: Bedside commode Equipment Used: Gait belt Transfers/Ambulation Related to ADLs: +2 HHA. scissoring gait at times. LOB.  Cognition: Cognition Overall Cognitive Status: Impaired/Different from baseline Orientation Level: Oriented to person;Oriented to situation;Oriented to time;Oriented to place Cognition Arousal/Alertness: Awake/alert Behavior During Therapy: WFL for tasks assessed/performed Overall Cognitive Status: Impaired/Different from baseline Area of Impairment: Orientation;Attention;Memory;Following commands;Safety/judgement;Awareness;Problem solving Orientation Level: Disoriented to;Place;Time;Situation Current Attention Level: Sustained Memory: Decreased recall of precautions;Decreased short-term memory Following Commands: Follows one step commands consistently Safety/Judgement: Decreased awareness of safety;Decreased awareness of deficits (poor balance and staes he can walk by himself) Awareness: Intellectual Problem Solving: Slow processing;Requires verbal cues General  Comments: confabulating  Blood pressure 104/58, pulse 86, temperature 98.2 F (36.8 C), temperature source Oral, resp. rate 18, height 5\' 8"  (1.727 m), weight 92.7 kg (204 lb 5.9 oz), SpO2 98.00%.   Physical Exam  Nursing note and vitals reviewed. Constitutional: He appears well-developed and well-nourished.  HENT:  Head: Normocephalic and atraumatic.  Eyes: Conjunctivae are normal. Pupils are equal, round, and reactive to light.  Neck: Normal range of motion. Neck supple.  Cardiovascular: Normal rate and regular rhythm.   No murmur heard. Pulmonary/Chest: Effort normal and breath sounds normal. No respiratory distress. He has no wheezes.  Abdominal: Soft. Bowel sounds are normal. He exhibits no distension. There is no tenderness.  Musculoskeletal: He exhibits no edema and no tenderness.  Neurological: He is alert.  Oriented to self and place. He had questions about his condition and surgery. Problems with recall. Speech clear and able to follow one and two step commands without difficulty. BLE with weakness but symmetrical. Strength grossly 4/5 UE and LE. Decreased awareness of deficits but surprisingly alert. Carries conversation, fluid speech.  Skin: Skin is warm and dry.  Psychiatric: He has a normal mood and affect. His speech is normal and behavior is normal. Cognition and memory are impaired. He expresses inappropriate judgment.    Results for orders placed during the hospital encounter of 10/08/12 (from the past 24 hour(s))  GLUCOSE, CAPILLARY     Status: Abnormal   Collection Time    10/22/12  5:07 PM      Result Value Range   Glucose-Capillary 235 (*) 70 - 99 mg/dL   Comment 1 Notify RN     Comment 2 Documented in Chart    GLUCOSE, CAPILLARY     Status: Abnormal   Collection Time    10/22/12 10:15 PM      Result Value Range   Glucose-Capillary 132 (*) 70 - 99 mg/dL   Comment  1 Notify RN     Comment 2 Documented in Chart    SODIUM     Status: Abnormal   Collection  Time    10/23/12 12:20 AM      Result Value Range   Sodium 131 (*) 135 - 145 mEq/L  CBC     Status: Abnormal   Collection Time    10/23/12  3:40 AM      Result Value Range   WBC 8.5  4.0 - 10.5 K/uL   RBC 3.66 (*) 4.22 - 5.81 MIL/uL   Hemoglobin 12.5 (*) 13.0 - 17.0 g/dL   HCT 96.0 (*) 45.4 - 09.8 %   MCV 93.4  78.0 - 100.0 fL   MCH 34.2 (*) 26.0 - 34.0 pg   MCHC 36.5 (*) 30.0 - 36.0 g/dL   RDW 11.9  14.7 - 82.9 %   Platelets 197  150 - 400 K/uL  BASIC METABOLIC PANEL     Status: Abnormal   Collection Time    10/23/12  3:40 AM      Result Value Range   Sodium 132 (*) 135 - 145 mEq/L   Potassium 3.7  3.5 - 5.1 mEq/L   Chloride 102  96 - 112 mEq/L   CO2 20  19 - 32 mEq/L   Glucose, Bld 115 (*) 70 - 99 mg/dL   BUN 13  6 - 23 mg/dL   Creatinine, Ser 5.62  0.50 - 1.35 mg/dL   Calcium 7.7 (*) 8.4 - 10.5 mg/dL   GFR calc non Af Amer >90  >90 mL/min   GFR calc Af Amer >90  >90 mL/min  MAGNESIUM     Status: None   Collection Time    10/23/12  3:40 AM      Result Value Range   Magnesium 2.1  1.5 - 2.5 mg/dL  PHOSPHORUS     Status: None   Collection Time    10/23/12  3:40 AM      Result Value Range   Phosphorus 2.7  2.3 - 4.6 mg/dL  GLUCOSE, CAPILLARY     Status: Abnormal   Collection Time    10/23/12  8:27 AM      Result Value Range   Glucose-Capillary 111 (*) 70 - 99 mg/dL  SODIUM     Status: Abnormal   Collection Time    10/23/12 10:29 AM      Result Value Range   Sodium 134 (*) 135 - 145 mEq/L   Ct Head Wo Contrast  10/22/2012   CLINICAL DATA:  Followup scan. Drainage clamped on ventriculostomy. History of subarachnoid hemorrhage.  EXAM: CT HEAD WITHOUT CONTRAST  TECHNIQUE: Contiguous axial images were obtained from the base of the skull through the vertex without intravenous contrast.  COMPARISON:  10/21/2012  FINDINGS: Residual subarachnoid hemorrhage is less dense and less well-defined on the current study. Minimal layering hemorrhage is seen in the occipital horns of  the lateral ventricles, which is stable. No new intracranial hemorrhage.  The ventriculostomy catheter has its tip in the right lateral ventricle, stable. There is no hydrocephalus. The ventricles are normal in configuration.  No parenchymal masses or mass effect. No evidence of a recent cortical infarction.  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  Frontal sinuses, right maxillary sinus and most of the ethmoid air cells are opacified by mucosal thickening. There is near-complete opacification of the sphenoid sinuses by mucosal thickening. This is stable.  IMPRESSION:  1. Continued resolution of subarachnoid hemorrhage. Minimal dependent intraventricular hemorrhage is stable. No new intracranial hemorrhage. 2. No hydrocephalus. Ventricles are normal in size and are unchanged from the previous day's study. 3. No evidence of a cortical infarct. 4. Pansinusitis, stable from the prior study.   Electronically Signed   By: Amie Portland   On: 10/22/2012 13:18    Assessment/Plan: Diagnosis: SAH 1. Does the need for close, 24 hr/day medical supervision in concert with the patient's rehab needs make it unreasonable for this patient to be served in a less intensive setting? Yes 2. Co-Morbidities requiring supervision/potential complications: pain, post-SAH sequelae 3. Due to bladder management, bowel management, safety, skin/wound care, disease management, medication administration, pain management and patient education, does the patient require 24 hr/day rehab nursing? Yes 4. Does the patient require coordinated care of a physician, rehab nurse, PT (1-2 hrs/day, 5 days/week), OT (1-2 hrs/day, 5 days/week) and SLP (1-2 hrs/day, 5 days/week) to address physical and functional deficits in the context of the above medical diagnosis(es)? Yes Addressing deficits in the following areas: balance, endurance, locomotion, strength, transferring, bowel/bladder control, bathing,  dressing, feeding, grooming, toileting, cognition and psychosocial support 5. Can the patient actively participate in an intensive therapy program of at least 3 hrs of therapy per day at least 5 days per week? Yes 6. The potential for patient to make measurable gains while on inpatient rehab is excellent 7. Anticipated functional outcomes upon discharge from inpatient rehab are supervision with PT, supervision with OT, n/a with SLP. 8. Estimated rehab length of stay to reach the above functional goals is: ?1-2 week 9. Does the patient have adequate social supports to accommodate these discharge functional goals? Yes 10. Anticipated D/C setting: Home 11. Anticipated post D/C treatments: HH therapy 12. Overall Rehab/Functional Prognosis: good  RECOMMENDATIONS: This patient's condition is appropriate for continued rehabilitative care in the following setting: CIR Patient has agreed to participate in recommended program. Yes Note that insurance prior authorization may be required for reimbursement for recommended care.  Comment: Rehab RN to follow up. He appears to have made some progress over the weekend, but appears to lack safety awareness. Headaches are an issue.    Ranelle Oyster, MD, [place] [facility]     10/23/2012

## 2012-10-23 NOTE — Progress Notes (Signed)
PULMONARY  / CRITICAL CARE MEDICINE  Name: Richard Davenport MRN: [id number] DOB: 05-Nov-1960    ADMISSION DATE:  10/08/2012 CONSULTATION DATE:  10/09/2012  REFERRING MD :  Dr. Conchita Paris PRIMARY SERVICE: Neurosurgery  CHIEF COMPLAINT:  SAH  BRIEF PATIENT DESCRIPTION:  Admitted via ED 9/18 with SAH. Underwent coiling. Self extubated 9/20.  Held in ICU for 14 days for spasm.  LINES / TUBES: ETT 9/19 >> 9/20 (self extubated)  CULTURES: 9/23 blood>>>NTD 9/23 CSF>>>NTD  ANTIBIOTICS: vanc 9/23>>>9/26 cefraixone 9/23>>>9/26  SUBJECTIVE/OVERNIGHT/INTERVAL HX N/V overnight, AXR with early ileus.  PHYSICAL EXAM  VITAL SIGNS: Temp:  [97.4 F (36.3 C)-98.9 F (37.2 C)] 97.4 F (36.3 C) (10/03 0800) Pulse Rate:  [67-105] 92 (10/03 1000) Resp:  [13-25] 18 (10/03 1000) BP: (104-156)/(53-138) 109/72 mmHg (10/03 1000) SpO2:  [95 %-100 %] 100 % (10/03 1000)  HEMODYNAMICS:   VENTILATOR SETTINGS:   INTAKE / OUTPUT: Intake/Output     10/02 0701 - 10/03 0700 10/03 0701 - 10/04 0700   P.O. 1314 120   I.V. (mL/kg) 3000 (32.4) 375 (4)   IV Piggyback 645    Total Intake(mL/kg) 4959 (53.5) 495 (5.3)   Urine (mL/kg/hr) 4400 (2) 600 (2.1)   Drains     Total Output 4400 600   Net +559 -105        Urine Occurrence 1 x    Stool Occurrence 2 x     PHYSICAL EXAMINATION: General: No distress Neuro: Alert and interactive, moving all ext to command HEENT:  Ventric drain out. Cardiovascular:  RRR, Nl S1/S2, -M/R/G. Lungs: Clear. Abdomen: Soft, NT, ND and hypoactive BS. Ext: no edema, left brachial, small nodule, not red, not painful, no dc  LABS:  PULMONARY No results found for this basename: PHART, PCO2, PCO2ART, PO2, PO2ART, HCO3, TCO2, O2SAT,  in the last 168 hours CBC  Recent Labs Lab 10/20/12 0410 10/22/12 0430 10/23/12 0340  HGB 13.3 13.1 12.5*  HCT 36.6* 35.5* 34.2*  WBC 9.9 8.6 8.5  PLT 200 205 197   COAGULATION No results found for this basename: INR,  in the last 168  hours CARDIAC   No results found for this basename: TROPONINI,  in the last 168 hours No results found for this basename: PROBNP,  in the last 168 hours  CHEMISTRY  Recent Labs Lab 10/18/12 0555  10/19/12 0350  10/20/12 0410  10/21/12 2309 10/22/12 0430 10/22/12 1140 10/23/12 0020 10/23/12 0340  NA 133*  < > 132*  < > 132*  < > 133* 131* 126* 131* 132*  K 3.5  --  3.7  --  4.0  --   --  3.8  --   --  3.7  CL 105  --  102  --  102  --   --  102  --   --  102  CO2 17*  --  17*  --  19  --   --  19  --   --  20  GLUCOSE 96  --  100*  --  111*  --   --  98  --   --  115*  BUN 20  --  16  --  15  --   --  12  --   --  13  CREATININE 0.90  --  0.78  --  0.82  --   --  0.75  --   --  0.75  CALCIUM 7.4*  --  8.0*  --  8.3*  --   --  7.7*  --   --  7.7*  MG 2.2  --  2.1  --  2.0  --   --  1.8  --   --  2.1  PHOS 2.7  --  2.9  --  3.2  --   --  2.6  --   --  2.7  < > = values in this interval not displayed. Estimated Creatinine Clearance: 122 ml/min (by C-G formula based on Cr of 0.75).  LIVER No results found for this basename: AST, ALT, ALKPHOS, BILITOT, PROT, ALBUMIN, INR,  in the last 168 hours INFECTIOUS No results found for this basename: LATICACIDVEN, PROCALCITON,  in the last 168 hours  ENDOCRINE CBG (last 3)   Recent Labs  10/22/12 1707 10/22/12 2215 10/23/12 0827  GLUCAP 235* 132* 111*   CXR: NNF  ASSESSMENT / PLAN: Principal Problem:   SAH (subarachnoid hemorrhage) Active Problems:   Altered mental status   Acute respiratory failure with hypoxia  PULMONARY A: Resp failure, resolved P: - IS per RT.  CARDIOVASCULAR A:  No acute issues P: - BP control as ordered.  RENAL A: Hyponatremia in setting of SAH R/o SIADH vs CSW vs iatrogenic.  Na dropping farther even with fludrocortisone and normal saline. Hypo K, Mg and Phos. P: - BMET in AM. - Replace K and Phos. - Urine Na 117, Urine osm 715, unlikely SIADH or CSW, will need to repeat once off  IVF. - No free water. - D/C fludrocortisone, once IVF can be discontinued then monitor Na and if drops again then will need renal to evaluate.  GASTROINTESTINAL A: Early ileus P: - Continue diet. - QT of 0.3, continue reglan but change to PO. - D/C reglan.  HEMATOLOGIC A:   Thrombocytopenia, no acute bleeding P: - Monitor CBC intermittently.  INFECTIOUS A: No further fever, will monitor for now. P: - D/Ced abx on 9/26, will continue to monitor. - Monitor WBC and fever curve.  ENDOCRINE A:   Mild hyperglycemia without prior hx of DM P:   - ISS and CBG.  NEUROLOGIC A:  SAH CT showed no hydrocephalus P:   - Mgmt per NS. - BP control per NS. - Ventric out.  PCCM will sign off, please call back if needed.  Alyson Reedy, M.D. [facility] Pulmonary/Critical Care Medicine. Pager: [phone]. After hours pager: [phone].

## 2012-10-23 NOTE — Progress Notes (Signed)
Rehab Admissions Coordinator Note:  Patient was screened by Trish Mage for appropriateness for an Inpatient Acute Rehab Consult.  Noted PT/OT recommending CIR.   At this time, we are recommending Inpatient Rehab consult.  Trish Mage 10/23/2012, 12:10 PM  I can be reached at [phone].

## 2012-10-23 NOTE — Progress Notes (Signed)
Pt seen and examined. No issues overnight. Pt reports improvement in HA. No other c/o. Per RN, pt remains impulsive, attempts to get OOB without supervision.  EXAM: Temp:  [97.4 F (36.3 C)-98.9 F (37.2 C)] 98.2 F (36.8 C) (10/03 1155) Pulse Rate:  [67-105] 86 (10/03 1200) Resp:  [14-25] 18 (10/03 1200) BP: (104-156)/(53-138) 104/58 mmHg (10/03 1200) SpO2:  [95 %-100 %] 98 % (10/03 1200) Intake/Output     10/02 0701 - 10/03 0700 10/03 0701 - 10/04 0700   P.O. 1314 120   I.V. (mL/kg) 3000 (32.4) 625 (6.7)   IV Piggyback 645 500   Total Intake(mL/kg) 4959 (53.5) 1245 (13.4)   Urine (mL/kg/hr) 4400 (2) 600 (1)   Drains     Total Output 4400 600   Net +559 +645        Urine Occurrence 1 x 1 x   Stool Occurrence 2 x 1 x    Awake, alert, oriented Intubated, breathing over vent Follows commands BUE/BLE Wound c/d/i  LABS: Lab Results  Component Value Date   CREATININE 0.75 10/23/2012   BUN 13 10/23/2012   NA 134* 10/23/2012   K 3.7 10/23/2012   CL 102 10/23/2012   CO2 20 10/23/2012   Lab Results  Component Value Date   WBC 8.5 10/23/2012   HGB 12.5* 10/23/2012   HCT 34.2* 10/23/2012   MCV 93.4 10/23/2012   PLT 197 10/23/2012    IMAGING: TCD velocities reviewed. Not indicative of vasospasm.  IMPRESSION: - 52 y.o. 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 - Neurologically stable without EVD, no clinical or TCD spasm  PLAN: - Neurologically stable for transfer to floor. Will need supervision or camera room - Consult PMR for inpatient rehab - Check sodium tomorrow am, with previous hyponatremia.

## 2012-10-24 LAB — GLUCOSE, CAPILLARY
Glucose-Capillary: 88 mg/dL (ref 70–99)
Glucose-Capillary: 130 mg/dL — ABNORMAL HIGH (ref 70–99)
Glucose-Capillary: 115 mg/dL — ABNORMAL HIGH (ref 70–99)
Glucose-Capillary: 113 mg/dL — ABNORMAL HIGH (ref 70–99)

## 2012-10-24 LAB — SODIUM: Sodium: 135 meq/L (ref 135–145)

## 2012-10-24 NOTE — Progress Notes (Signed)
Subjective: Patient reports no c/o  Objective: Vital signs in last 24 hours: Temp:  [97.9 F (36.6 C)-98.8 F (37.1 C)] 98.5 F (36.9 C) (10/04 0524) Pulse Rate:  [71-98] 91 (10/04 0524) Resp:  [17-21] 18 (10/04 0524) BP: (104-141)/(58-95) 124/76 mmHg (10/04 0524) SpO2:  [93 %-100 %] 100 % (10/04 0524)  Intake/Output from previous day: 10/03 0701 - 10/04 0700 In: 1870 [P.O.:120; I.V.:1250; IV Piggyback:500] Out: 3550 [Urine:3550] Intake/Output this shift:    AAOx3, FC all 4  - no drift  Lab Results:  Recent Labs  10/22/12 0430 10/23/12 0340  WBC 8.6 8.5  HGB 13.1 12.5*  HCT 35.5* 34.2*  PLT 205 197   BMET  Recent Labs  10/23/12 0340  10/23/12 1518 10/23/12 2230  NA 132*  < > 136 138  K 3.7  --  3.5  --   CL 102  --  104  --   CO2 20  --  23  --   GLUCOSE 115*  --  120*  --   BUN 13  --  14  --   CREATININE 0.75  --  0.77  --   CALCIUM 7.7*  --  7.8*  --   < > = values in this interval not displayed.  Studies/Results: Ct Head Wo Contrast  10/22/2012   CLINICAL DATA:  Followup scan. Drainage clamped on ventriculostomy. History of subarachnoid hemorrhage.  EXAM: CT HEAD WITHOUT CONTRAST  TECHNIQUE: Contiguous axial images were obtained from the base of the skull through the vertex without intravenous contrast.  COMPARISON:  10/21/2012  FINDINGS: Residual subarachnoid hemorrhage is less dense and less well-defined on the current study. Minimal layering hemorrhage is seen in the occipital horns of the lateral ventricles, which is stable. No new intracranial hemorrhage.  The ventriculostomy catheter has its tip in the right lateral ventricle, stable. There is no hydrocephalus. The ventricles are normal in configuration.  No parenchymal masses or mass effect. No evidence of a recent cortical infarction.  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  Frontal sinuses, right maxillary sinus and most of the ethmoid air cells are  opacified by mucosal thickening. There is near-complete opacification of the sphenoid sinuses by mucosal thickening. This is stable.  IMPRESSION: 1. Continued resolution of subarachnoid hemorrhage. Minimal dependent intraventricular hemorrhage is stable. No new intracranial hemorrhage. 2. No hydrocephalus. Ventricles are normal in size and are unchanged from the previous day's study. 3. No evidence of a cortical infarct. 4. Pansinusitis, stable from the prior study.   Electronically Signed   By: Amie Portland   On: 10/22/2012 13:18    Assessment/Plan: Pt doing well - CPM   LOS: 16 days     Clydene Fake, MD 10/24/2012, 8:31 AM

## 2012-10-25 LAB — GLUCOSE, CAPILLARY
Glucose-Capillary: 105 mg/dL — ABNORMAL HIGH (ref 70–99)
Glucose-Capillary: 148 mg/dL — ABNORMAL HIGH (ref 70–99)

## 2012-10-25 NOTE — Progress Notes (Signed)
Pt was seen by Dr. Jordan Likes and informed about pt's on and off headaches relived by PRN's tylenol and dilaudid with no new orders at this time. Pt alert& responsive in no acute distress.

## 2012-10-25 NOTE — Progress Notes (Signed)
No new problems. Patient states headache controlled. Patient's anxious to leave the hospital.  Afebrile. Vital stable. Motor 5/5 bilaterally. No pronator drift. Chest abdomen benign.  Overall doing well. Continue efforts at mobilization.

## 2012-10-26 DIAGNOSIS — G911 Obstructive hydrocephalus: Secondary | ICD-10-CM

## 2012-10-26 DIAGNOSIS — I609 Nontraumatic subarachnoid hemorrhage, unspecified: Secondary | ICD-10-CM

## 2012-10-26 LAB — GLUCOSE, CAPILLARY
Glucose-Capillary: 102 mg/dL — ABNORMAL HIGH (ref 70–99)
Glucose-Capillary: 194 mg/dL — ABNORMAL HIGH (ref 70–99)
Glucose-Capillary: 141 mg/dL — ABNORMAL HIGH (ref 70–99)

## 2012-10-26 LAB — SODIUM: Sodium: 137 mEq/L (ref 135–145)

## 2012-10-26 NOTE — Progress Notes (Signed)
Physical Therapy Treatment Patient Details Name: Richard Davenport MRN: [id number] DOB: 1960-07-02 Today's Date: 10/26/2012 Time: 6578-4696 PT Time Calculation (min): 24 min  PT Assessment / Plan / Recommendation  History of Present Illness Pt is s/p SAH.     PT Comments   Patient initially appears to demonstrate improvements in mobility with use of assistive device, however, as patient begins to move off path of straight line, patient demonstrates significant balance deficits and had multiple LOB requiring mod assist to prevent patient from complete fall. Patient also demonstrates increased cognitive deficits impairing safety and judgement during mobility, patient demonstrates poor awareness of instability and is not able to self correct. At this time, patient continues to be an increased fall risk and requires increased assist with mobility. Will continue to see and progress activity with patient as tolerated. Of note, patient did complain of 7/10 headache and dizziness with activity. Nsg aware. Pain meds requested by patient.  Follow Up Recommendations  CIR;Supervision/Assistance - 24 hour     Does the patient have the potential to tolerate intense rehabilitation     Barriers to Discharge        Equipment Recommendations  Other (comment) (TBA)    Recommendations for Other Services Rehab consult  Frequency Min 3X/week   Progress towards PT Goals Progress towards PT goals: Progressing toward goals  Plan Current plan remains appropriate    Precautions / Restrictions Precautions Precautions: Fall Restrictions Weight Bearing Restrictions: No   Pertinent Vitals/Pain 7/10 headache; +dizziness    Mobility  Bed Mobility Bed Mobility: Supine to Sit;Sitting - Scoot to Edge of Bed Supine to Sit: 5: Supervision Sitting - Scoot to [place] of Bed: 5: Supervision Transfers Transfers: Sit to Stand;Stand to Sit Sit to Stand: 4: Min assist;With upper extremity assist;From bed;From chair/3-in-1 Stand  to Sit: 4: Min assist;With upper extremity assist;To chair/3-in-1 Details for Transfer Assistance: Min Assist for stability  Ambulation/Gait Ambulation/Gait Assistance: 4: Min assist;3: Mod assist Ambulation Distance (Feet): 420 Feet Assistive device: Rolling walker;1 person hand held assist Ambulation/Gait Assistance Details: patient very unsteady with ambulation, scissoring gait with noted multiple LOB with and without device requiring increased assist to prevent complete fall.  Gait Pattern: Decreased stride length;Step-through pattern Modified Rankin (Stroke Patients Only) Pre-Morbid Rankin Score: No symptoms Modified Rankin: Moderately severe disability      PT Goals (current goals can now be found in the care plan section) Acute Rehab PT Goals Patient Stated Goal: go back to work PT Goal Formulation: With patient Time For Goal Achievement: 11/06/12 Potential to Achieve Goals: Good  Visit Information  Last PT Received On: 10/26/12 Assistance Needed: +2 (safety) History of Present Illness: Pt is s/p SAH.      Subjective Data  Subjective: I have a headache and feel dizzy (post activity) Patient Stated Goal: go back to work   Cognition  Cognition Arousal/Alertness: Awake/alert Behavior During Therapy: Flat affect Overall Cognitive Status: Impaired/Different from baseline Area of Impairment: Orientation;Attention;Memory;Safety/judgement;Awareness Orientation Level: Place Current Attention Level: Sustained Memory: Decreased short-term memory Following Commands: Follows one step commands consistently;Follows multi-step commands inconsistently Safety/Judgement: Decreased awareness of safety;Decreased awareness of deficits Awareness: Intellectual Problem Solving: Slow processing;Requires verbal cues;Requires tactile cues General Comments: Pt confabulating throughout session. Patient stated that he slept well last night and came into the hospital today at 10 am. Patient also  stated that he went out to eat for lunch and is not hungry anymore.    Balance  Balance Balance Assessed: Yes Static Sitting Balance Static Sitting -  Balance Support: No upper extremity supported Static Sitting - Level of Assistance: 5: Stand by assistance Static Standing Balance Static Standing - Balance Support: During functional activity Static Standing - Level of Assistance: 3: Mod assist Single Leg Stance - Right Leg:  (could not perform without assist) Single Leg Stance - Left Leg:  (could not perform without assist) Tandem Stance - Right Leg:  (less than 3 seconds with eyes open) Tandem Stance - Left Leg:  (less than 3 seconds with eyes open) Dynamic Standing Balance Dynamic Standing - Balance Support: No upper extremity supported Dynamic Standing - Level of Assistance: 4: Min assist;3: Mod assist Dynamic Standing - Balance Activities: Other (comment) (ADLs ) Dynamic Standing - Comments: Loses balance requiring mod A to recover Standardized Balance Assessment Standardized Balance Assessment: Dynamic Gait Index Dynamic Gait Index Level Surface: Mild Impairment Change in Gait Speed: Mild Impairment Gait with Horizontal Head Turns: Moderate Impairment Gait with Vertical Head Turns: Mild Impairment Gait and Pivot Turn: Severe Impairment Step Over Obstacle: Severe Impairment Step Around Obstacles: Moderate Impairment Steps: Moderate Impairment Total Score: 9 High Level Balance High Level Balance Activites: Side stepping;Direction changes;Turns;Sudden stops;Head turns High Level Balance Comments: min to mod assist, unable to self correct balance checks  End of Session PT - End of Session Equipment Utilized During Treatment: Gait belt Activity Tolerance: Patient limited by fatigue Patient left: in chair;with call bell/phone within reach Nurse Communication: Mobility status;Patient requests pain meds   GP     Richard Davenport 10/26/2012, 3:10 PM Richard Davenport, PT DPT   [phone]

## 2012-10-26 NOTE — Progress Notes (Addendum)
I contacted pt's spouse by phone. She is in agreement to admit pt to inpt rehab pending bed availability over the next day or so. I will follow up in the morning. I also spoke to [place], cousin, by phone and he is also in agreement.  [phone]

## 2012-10-26 NOTE — Progress Notes (Signed)
Occupational Therapy Treatment Patient Details Name: Richard Davenport MRN: [id number] DOB: 20-Jan-1961 Today's Date: 10/26/2012 Time: 4332-9518 OT Time Calculation (min): 28 min  OT Assessment / Plan / Recommendation  History of present illness Pt is s/p SAH.     OT comments  Pt is improving, but continues with balance deficits.  Memory is impaired and pt noted to confabulate.   Would definitely benefit from CIR.  Follow Up Recommendations  CIR    Barriers to Discharge       Equipment Recommendations  Tub/shower seat    Recommendations for Other Services Rehab consult  Frequency Min 3X/week   Progress towards OT Goals Progress towards OT goals: Progressing toward goals  Plan Discharge plan remains appropriate    Precautions / Restrictions Precautions Precautions: Fall Restrictions Weight Bearing Restrictions: No   Pertinent Vitals/Pain     ADL  Eating/Feeding: Supervision/safety Where Assessed - Eating/Feeding: Chair Grooming: Minimal assistance Where Assessed - Grooming: Supported standing Lower Body Dressing: Minimal assistance Where Assessed - Lower Body Dressing: Supported sit to stand Toilet Transfer: Minimal assistance;Moderate assistance Toilet Transfer Method: Sit to stand;Stand pivot Toilet Transfer Equipment: Comfort height toilet Toileting - Clothing Manipulation and Hygiene: Minimal assistance Where Assessed - Toileting Clothing Manipulation and Hygiene: Sit to stand from 3-in-1 or toilet Transfers/Ambulation Related to ADLs: min A for ambulation, but when Loses balance, requires mod A to recover ADL Comments: Pt with poor awareness of deficits    OT Diagnosis:    OT Problem List:   OT Treatment Interventions:     OT Goals(current goals can now be found in the care plan section) Acute Rehab OT Goals Patient Stated Goal: go back to work OT Goal Formulation: Patient unable to participate in goal setting Time For Goal Achievement: 11/06/12 Potential to  Achieve Goals: Good ADL Goals Pt Will Perform Grooming: with modified independence;standing Pt Will Perform Lower Body Bathing: with modified independence;sit to/from stand Pt Will Perform Lower Body Dressing: with modified independence;sit to/from stand Pt Will Transfer to Toilet: with modified independence;ambulating Pt Will Perform Toileting - Clothing Manipulation and hygiene: with modified independence;sit to/from stand Additional ADL Goal #1: demonstrate emergent awreness with ADL in distractive environment during multistep ADL task  Visit Information  Last OT Received On: 10/26/12 Assistance Needed: +2 (safety) History of Present Illness: Pt is s/p SAH.      Subjective Data      Prior Functioning       Cognition  Cognition Arousal/Alertness: Awake/alert Behavior During Therapy: Flat affect Overall Cognitive Status: Impaired/Different from baseline Area of Impairment: Orientation;Attention;Memory;Safety/judgement;Awareness Orientation Level: Place Current Attention Level: Sustained Memory: Decreased short-term memory Following Commands: Follows one step commands consistently;Follows multi-step commands inconsistently Safety/Judgement: Decreased awareness of safety;Decreased awareness of deficits Awareness: Intellectual Problem Solving: Slow processing;Requires verbal cues;Requires tactile cues General Comments: Pt noted to confabulate.  Pt insists he went home over the weekend and came back.  Poor recall of day's events.      Mobility  Bed Mobility Bed Mobility: Supine to Sit;Sitting - Scoot to Edge of Bed Supine to Sit: 5: Supervision Sitting - Scoot to [place] of Bed: 5: Supervision Transfers Transfers: Sit to Stand;Stand to Sit Sit to Stand: 4: Min assist;With upper extremity assist;From bed;From chair/3-in-1 Stand to Sit: 4: Min assist;With upper extremity assist;To chair/3-in-1 Details for Transfer Assistance: Pt requires min A due to impaired balance     Exercises      Balance Balance Balance Assessed: Yes Static Sitting Balance Static Sitting - Balance Support:  No upper extremity supported Static Sitting - Level of Assistance: 5: Stand by assistance Dynamic Standing Balance Dynamic Standing - Balance Support: No upper extremity supported Dynamic Standing - Level of Assistance: 4: Min assist;3: Mod assist Dynamic Standing - Balance Activities: Other (comment) (ADLs ) Dynamic Standing - Comments: Loses balance requiring mod A to recover   End of Session OT - End of Session Activity Tolerance: Patient tolerated treatment well Patient left: in chair;with chair alarm set;with call bell/phone within reach Nurse Communication: Mobility status  GO     Richard Davenport M 10/26/2012, 2:02 PM

## 2012-10-26 NOTE — Progress Notes (Signed)
I await further therapy assessments today. And will follow up with pt. [phone]

## 2012-10-26 NOTE — Progress Notes (Signed)
No issues overnight. Pt reports mild HA, otherwise no c/o.  EXAM:  BP 115/65  Pulse 93  Temp(Src) 97.4 F (36.3 C) (Oral)  Resp 18  Ht 5\' 8"  (1.727 m)  Wt 92.7 kg (204 lb 5.9 oz)  BMI 31.08 kg/m2  SpO2 97%  Awake, alert, oriented  Speech fluent, appropriate  CN grossly intact  5/5 BUE/BLE   IMPRESSION:  52 y.o. male SAH d# 18 s/p basilar aneurysm coiling Neurologically well  PLAN: - Medically stable for transfer to IPR when available.

## 2012-10-27 ENCOUNTER — Inpatient Hospital Stay (HOSPITAL_COMMUNITY)
Admission: RE | Admit: 2012-10-27 | Discharge: 2012-11-05 | DRG: 945 | Disposition: A | Discharge status: Home / Self Care | Payer: Medicaid Other | Source: Intra-hospital | Attending: Physical Medicine & Rehabilitation | Admitting: Physical Medicine & Rehabilitation

## 2012-10-27 DIAGNOSIS — I609 Nontraumatic subarachnoid hemorrhage, unspecified: Secondary | ICD-10-CM

## 2012-10-27 DIAGNOSIS — Z8673 Personal history of transient ischemic attack (TIA), and cerebral infarction without residual deficits: Secondary | ICD-10-CM

## 2012-10-27 DIAGNOSIS — R51 Headache: Secondary | ICD-10-CM | POA: Diagnosis present

## 2012-10-27 DIAGNOSIS — I10 Essential (primary) hypertension: Secondary | ICD-10-CM

## 2012-10-27 DIAGNOSIS — D62 Acute posthemorrhagic anemia: Secondary | ICD-10-CM | POA: Diagnosis present

## 2012-10-27 DIAGNOSIS — J9601 Acute respiratory failure with hypoxia: Secondary | ICD-10-CM

## 2012-10-27 DIAGNOSIS — R4182 Altered mental status, unspecified: Secondary | ICD-10-CM

## 2012-10-27 DIAGNOSIS — Z5189 Encounter for other specified aftercare: Principal | ICD-10-CM

## 2012-10-27 LAB — SODIUM: Sodium: 137 mEq/L (ref 135–145)

## 2012-10-27 LAB — GLUCOSE, CAPILLARY: Glucose-Capillary: 106 mg/dL — ABNORMAL HIGH (ref 70–99)

## 2012-10-27 MED ORDER — PANTOPRAZOLE SODIUM 40 MG PO TBEC
40.0000 mg | DELAYED_RELEASE_TABLET | Freq: Every day | ORAL | Status: DC
  Administered 2012-10-27 – 2012-11-04 (×9): 40 mg via ORAL
  Filled 2012-10-27 (×10): qty 1

## 2012-10-27 MED ORDER — TRAZODONE HCL 50 MG PO TABS
25.0000 mg | ORAL_TABLET | Freq: Every evening | ORAL | Status: DC | PRN
  Administered 2012-11-03: 50 mg via ORAL
  Filled 2012-10-27: qty 1

## 2012-10-27 MED ORDER — SIMVASTATIN 10 MG PO TABS: 10.0000 mg | ORAL_TABLET | Freq: Every day | ORAL | Status: DC

## 2012-10-27 MED ORDER — PROCHLORPERAZINE EDISYLATE 5 MG/ML IJ SOLN
5.0000 mg | Freq: Four times a day (QID) | INTRAMUSCULAR | Status: DC | PRN
  Filled 2012-10-27: qty 2

## 2012-10-27 MED ORDER — ENSURE COMPLETE PO LIQD
237.0000 mL | Freq: Two times a day (BID) | ORAL | Status: DC
  Administered 2012-10-28 – 2012-11-05 (×16): 237 mL via ORAL

## 2012-10-27 MED ORDER — SODIUM CHLORIDE 3 % IV SOLN: INTRAVENOUS | Status: DC

## 2012-10-27 MED ORDER — PROCHLORPERAZINE 25 MG RE SUPP
12.5000 mg | Freq: Four times a day (QID) | RECTAL | Status: DC | PRN
  Filled 2012-10-27: qty 1

## 2012-10-27 MED ORDER — ALUM & MAG HYDROXIDE-SIMETH 200-200-20 MG/5ML PO SUSP: 30.0000 mL | ORAL | Status: DC | PRN

## 2012-10-27 MED ORDER — BISACODYL 10 MG RE SUPP: 10.0000 mg | Freq: Every day | RECTAL | Status: DC | PRN

## 2012-10-27 MED ORDER — SIMVASTATIN 20 MG PO TABS
20.0000 mg | ORAL_TABLET | Freq: Every day | ORAL | Status: DC
  Administered 2012-10-27 – 2012-11-04 (×9): 20 mg via ORAL
  Filled 2012-10-27 (×10): qty 1

## 2012-10-27 MED ORDER — PROCHLORPERAZINE MALEATE 5 MG PO TABS
5.0000 mg | ORAL_TABLET | Freq: Four times a day (QID) | ORAL | Status: DC | PRN
  Filled 2012-10-27: qty 2

## 2012-10-27 MED ORDER — GUAIFENESIN-DM 100-10 MG/5ML PO SYRP
5.0000 mL | ORAL_SOLUTION | Freq: Four times a day (QID) | ORAL | Status: DC | PRN
  Filled 2012-10-27: qty 10

## 2012-10-27 MED ORDER — DIPHENHYDRAMINE HCL 12.5 MG/5ML PO ELIX: 12.5000 mg | ORAL_SOLUTION | Freq: Four times a day (QID) | ORAL | Status: DC | PRN

## 2012-10-27 MED ORDER — INSULIN ASPART 100 UNIT/ML ~~LOC~~ SOLN
0.0000 [IU] | Freq: Three times a day (TID) | SUBCUTANEOUS | Status: DC
  Administered 2012-10-29: 1 [IU] via SUBCUTANEOUS

## 2012-10-27 MED ORDER — ENSURE COMPLETE PO LIQD: 237.0000 mL | Freq: Two times a day (BID) | ORAL | Status: DC

## 2012-10-27 MED ORDER — ACETAMINOPHEN 325 MG PO TABS
325.0000 mg | ORAL_TABLET | ORAL | Status: DC | PRN
  Administered 2012-10-28 – 2012-11-03 (×12): 650 mg via ORAL
  Filled 2012-10-27 (×12): qty 2

## 2012-10-27 MED ORDER — OXYCODONE HCL 5 MG PO TABS
5.0000 mg | ORAL_TABLET | ORAL | Status: DC | PRN
  Administered 2012-10-28: 10 mg via ORAL
  Administered 2012-10-29 – 2012-11-02 (×9): 5 mg via ORAL
  Administered 2012-11-02 – 2012-11-04 (×7): 10 mg via ORAL
  Administered 2012-11-04: 5 mg via ORAL
  Administered 2012-11-04 – 2012-11-05 (×3): 10 mg via ORAL
  Filled 2012-10-27 (×3): qty 1
  Filled 2012-10-27: qty 2
  Filled 2012-10-27 (×3): qty 1
  Filled 2012-10-27 (×5): qty 2
  Filled 2012-10-27 (×2): qty 1
  Filled 2012-10-27: qty 2
  Filled 2012-10-27 (×2): qty 1
  Filled 2012-10-27 (×2): qty 2
  Filled 2012-10-27: qty 1
  Filled 2012-10-27 (×2): qty 2

## 2012-10-27 MED ORDER — SENNOSIDES-DOCUSATE SODIUM 8.6-50 MG PO TABS
1.0000 | ORAL_TABLET | Freq: Two times a day (BID) | ORAL | Status: DC
  Administered 2012-10-28 – 2012-11-05 (×17): 1 via ORAL
  Filled 2012-10-27 (×16): qty 1

## 2012-10-27 NOTE — Progress Notes (Signed)
No issues overnight. No current c/o.  EXAM:  BP 99/55  Pulse 78  Temp(Src) 98.1 F (36.7 C) (Oral)  Resp 18  Ht 5\' 8"  (1.727 m)  Wt 92.7 kg (204 lb 5.9 oz)  BMI 31.08 kg/m2  SpO2 98%  Awake, alert, oriented  Speech fluent, appropriate  CN grossly intact  5/5 BUE/BLE   IMPRESSION:  52 y.o. male SAH d# 19 s/p basilar aneurysm coiling Neurologically well  PLAN: - Medically stable for transfer to IPR when available. - Plan on f/u in my office in 1 month - Will d/c Keppra, Nimotop upon D/C, decrease Zocor dose to 20mg .

## 2012-10-27 NOTE — Progress Notes (Signed)
Pt for discharge to CIR today, IV D/C, report called to oncoming RN.  D/C instructions with verbalized understanding.  Staff at bedside to assist pt with discharge. Staff brought pt to CIR room via wheelchair.

## 2012-10-27 NOTE — H&P (Signed)
Physical Medicine and Rehabilitation Admission H&P    Chief Complaint  Patient presents with  . 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 male admitted on 10/08/12 with unresponsiveness due to diffuse SAH in the basal cisterns and  bilateral sylvian fissures with some degree of intraventricular hemorrhage. 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 Nundkumar. Patient self extubated on 10/10/12 and was placed on regular diet as no evidence of dysphagia reported per ST evaluation. He was noted to be febrile on 09/23 and was started on empiric antibiotics. ID consulted due to continued fevers despite negative BC and CSF. LUE duplex positive for superficial vein thrombosis involving the left basilic vein, solely in the antecubital fossa. ID recommended discontinuing antibiotics as fevers felt to be central due to CNS. Hyponatremia treated with florinef as well as normal saline with improvement. Headaches as well as confusion is resolving. Follow up CCT on 10/2 showed continued resolution of SAH, no cortical infarct or hydrocephalus. EVD removed on 10/02 and therapies initiated. He continues with headaches, dizziness, cognitive and balance deficits and CIR recommended for progression.    Review of Systems  HENT: Negative for hearing loss and neck pain.   Eyes: Negative for double vision and photophobia.  Respiratory: Negative for cough and shortness of breath.   Cardiovascular: Negative for chest pain and orthopnea.  Gastrointestinal: Negative for abdominal pain and constipation.  Genitourinary: Negative for urgency and frequency.  Musculoskeletal: Negative for myalgias and back pain.  Neurological: Positive for weakness and headaches.  Psychiatric/Behavioral: The patient does not have insomnia.    Past Medical History  Diagnosis  Date  . Headache    Past Surgical History  Procedure Laterality Date  . Radiology with anesthesia N/A 10/09/2012    Procedure: RADIOLOGY WITH ANESTHESIA;  Surgeon: Neelesh Nundkumar, MD;  Location: [facility] OR;  Service: Radiology;  Laterality: N/A;   History reviewed. No pertinent family history.  Social History: Married. Works as a mechanic?. Has four children ranging from 9 years to 20--wife works days in a factory. He reports that he has never smoked. He has never used smokeless tobacco. He reports that he does not drink alcohol or use illicit drugs.   Allergies  Allergen Reactions  . Pork-Derived Products     Patient is Muslim and has requested no pork products   Medications Prior to Admission  Medication Sig Dispense Refill  . HYDROcodone-acetaminophen (NORCO/VICODIN) 5-325 MG per tablet Take 1 tablet by mouth every 6 (six) hours as needed for pain.        Home: Home Living Family/patient expects to be discharged to:: Private residence Living Arrangements: Spouse/significant other;Children;Other (Comment) (second wife, 2 young schoolage children in the home) Available Help at Discharge: Family;Available 24 hours/day (unsure - per pt report) Type of Home: Apartment Home Access: Level entry Home Layout: One level Home Equipment: None Additional Comments: info verified with wife and cousin  Lives With: Spouse;Family;Other (Comment) (2 older daughters in shool at [employer])   Functional History: Prior Function Comments: mechanic  Functional Status:  Mobility: Bed Mobility Bed Mobility: Supine to Sit;Sitting - Scoot to Edge of Bed Supine to Sit: 5: Supervision Sitting - Scoot to Edge of Bed: 5: Supervision Sit to Supine: 4: Min guard Transfers Transfers: Sit to Stand;Stand to Sit Sit to Stand: 4: Min assist;With upper extremity assist;From   bed;From chair/3-in-1 Stand to Sit: 4: Min assist;With upper extremity assist;To chair/3-in-1 Ambulation/Gait Ambulation/Gait Assistance: 4:  Min assist;3: Mod assist Ambulation Distance (Feet): 420 Feet Assistive device: Rolling walker;1 person hand held assist Ambulation/Gait Assistance Details: patient very unsteady with ambulation, scissoring gait with noted multiple LOB with and without device requiring increased assist to prevent complete fall.  Gait Pattern: Decreased stride length;Step-through pattern Stairs: No Wheelchair Mobility Wheelchair Mobility: No  ADL: ADL Eating/Feeding: Supervision/safety Where Assessed - Eating/Feeding: Chair Grooming: Minimal assistance Where Assessed - Grooming: Supported standing Upper Body Bathing: Minimal assistance Where Assessed - Upper Body Bathing: Supported standing Lower Body Bathing: Moderate assistance Where Assessed - Lower Body Bathing: Supported sit to stand Upper Body Dressing: Minimal assistance Where Assessed - Upper Body Dressing: Supported sit to stand Lower Body Dressing: Minimal assistance Where Assessed - Lower Body Dressing: Supported sit to stand Toilet Transfer: Minimal assistance;Moderate assistance Toilet Transfer Method: Sit to stand;Stand pivot Toilet Transfer Equipment: Comfort height toilet Equipment Used: Gait belt Transfers/Ambulation Related to ADLs: min A for ambulation, but when Loses balance, requires mod A to recover ADL Comments: Pt with poor awareness of deficits  Cognition: Cognition Overall Cognitive Status: Impaired/Different from baseline Orientation Level: Oriented to person;Oriented to time;Oriented to situation;Disoriented to place Cognition Arousal/Alertness: Awake/alert Behavior During Therapy: Flat affect Overall Cognitive Status: Impaired/Different from baseline Area of Impairment: Orientation;Attention;Memory;Safety/judgement;Awareness Orientation Level: Place Current Attention Level: Sustained Memory: Decreased short-term memory Following Commands: Follows one step commands consistently;Follows multi-step commands  inconsistently Safety/Judgement: Decreased awareness of safety;Decreased awareness of deficits Awareness: Intellectual Problem Solving: Slow processing;Requires verbal cues;Requires tactile cues General Comments: Pt confabulating throughout session. Patient stated that he slept well last night and came into the hospital today at 10 am. Patient also stated that he went out to eat for lunch and is not hungry anymore.  Physical Exam: Blood pressure 106/70, pulse 82, temperature 97.9 F (36.6 C), temperature source Oral, resp. rate 18, height 5' 8" (1.727 m), weight 92.7 kg (204 lb 5.9 oz), SpO2 98.00%. Nursing note and vitals reviewed.  Constitutional: He appears well-developed and well-nourished.  HENT:  Head: Normocephalic and atraumatic.  Eyes: Conjunctivae are normal. Pupils are equal, round, and reactive to light.  Neck: Normal range of motion. Neck supple.  Cardiovascular: Normal rate and regular rhythm.  No murmur heard.  Pulmonary/Chest: Effort normal and breath sounds normal. No respiratory distress. He has no wheezes.  Abdominal: Soft. Bowel sounds are normal. He exhibits no distension. There is no tenderness.  Musculoskeletal: He exhibits no edema and no tenderness.  Neurological: He is alert.  Oriented to self and place. Told me he was here because of his head. Problems with short term recall. Speech clear and able to follow one and two step commands without difficulty. Strength remains symmetrical but generally 4/5 proximal to distal, lower and upper.   Carries conversation, fluid speech.  Skin: Skin is warm and dry.  Psychiatric: He has a normal mood and affect. His speech is normal and behavior is normal. Cognition and memory are impaired. He expresses inappropriate judgment.     Results for orders placed during the hospital encounter of 10/08/12 (from the past 48 hour(s))  GLUCOSE, CAPILLARY     Status: Abnormal   Collection Time    10/25/12  4:58 PM      Result Value  Range   Glucose-Capillary 150 (*) 70 - 99 mg/dL  GLUCOSE, CAPILLARY     Status: Abnormal   Collection Time    10/25/12   10:24 PM      Result Value Range   Glucose-Capillary 148 (*) 70 - 99 mg/dL  SODIUM     Status: None   Collection Time    10/25/12 11:20 PM      Result Value Range   Sodium 137  135 - 145 mEq/L  GLUCOSE, CAPILLARY     Status: Abnormal   Collection Time    10/26/12  6:26 AM      Result Value Range   Glucose-Capillary 102 (*) 70 - 99 mg/dL  GLUCOSE, CAPILLARY     Status: Abnormal   Collection Time    10/26/12 11:36 AM      Result Value Range   Glucose-Capillary 194 (*) 70 - 99 mg/dL   Comment 1 Documented in Chart    SODIUM     Status: None   Collection Time    10/26/12 11:50 AM      Result Value Range   Sodium 135  135 - 145 mEq/L  GLUCOSE, CAPILLARY     Status: Abnormal   Collection Time    10/26/12  4:37 PM      Result Value Range   Glucose-Capillary 141 (*) 70 - 99 mg/dL   Comment 1 Documented in Chart    SODIUM     Status: None   Collection Time    10/26/12 11:11 PM      Result Value Range   Sodium 138  135 - 145 mEq/L  SODIUM     Status: None   Collection Time    10/27/12 10:25 AM      Result Value Range   Sodium 137  135 - 145 mEq/L  GLUCOSE, CAPILLARY     Status: Abnormal   Collection Time    10/27/12 11:24 AM      Result Value Range   Glucose-Capillary 120 (*) 70 - 99 mg/dL   Comment 1 Documented in Chart     No results found.  Post Admission Physician Evaluation: 1. Functional deficits secondary  to SAH/IVH. 2. Patient is admitted to receive collaborative, interdisciplinary care between the physiatrist, rehab nursing staff, and therapy team. 3. Patient's level of medical complexity and substantial therapy needs in context of that medical necessity cannot be provided at a lesser intensity of care such as a SNF. 4. Patient has experienced substantial functional loss from his/her baseline which was documented above under the "Functional  History" and "Functional Status" headings.  Judging by the patient's diagnosis, physical exam, and functional history, the patient has potential for functional progress which will result in measurable gains while on inpatient rehab.  These gains will be of substantial and practical use upon discharge  in facilitating mobility and self-care at the household level. 5. Physiatrist will provide 24 hour management of medical needs as well as oversight of the therapy plan/treatment and provide guidance as appropriate regarding the interaction of the two. 6. 24 hour rehab nursing will assist with bladder management, bowel management, safety, skin/wound care, disease management, medication administration and patient education  and help integrate therapy concepts, techniques,education, etc. 7. PT will assess and treat for/with: Lower extremity strength, range of motion, stamina, balance, functional mobility, safety, adaptive techniques and equipment, NMR, edcuation.   Goals are: mod I to supervision. 8. OT will assess and treat for/with: ADL's, functional mobility, safety, upper extremity strength, adaptive techniques and equipment, NMR, functional awareness.   Goals are: mod I to supervision. 9. SLP will assess and treat for/with: cognition.  Goals are:   mod I to supervision. 10. Case Management and Social Worker will assess and treat for psychological issues and discharge planning. 11. Team conference will be held weekly to assess progress toward goals and to determine barriers to discharge. 12. Patient will receive at least 3 hours of therapy per day at least 5 days per week. 13. ELOS: one week       14. Prognosis:  excellent   Medical Problem List and Plan: 1. DVT Prophylaxis/Anticoagulation: Mechanical: Sequential compression devices, below knee Bilateral lower extremities 2. Headaches/Pain Management:  Will continue prn medications for headaches.  3. Mood: No signs of distress. Will have LCSW follow for  evaluation.  4. Neuropsych: This patient is not capable of making decisions on his own behalf. 5. Seizure prophylaxis: Keppra and nimotop discontinued today.  6.  ABLA : will check follow up labs in am.    Zachary T. Swartz, MD, FAAPMR [facility]   10/27/2012 

## 2012-10-27 NOTE — Progress Notes (Signed)
Discussed with Dr. Riley Kill. I will admit pt to inpt rehab today. I have spoken to pt's wife via phone and she is in agreement. I will contact Dr. Conchita Paris to arrange. [phone]

## 2012-10-27 NOTE — PMR Pre-admission (Signed)
PMR Admission Coordinator Pre-Admission Assessment  Patient: Richard Davenport is an 52 y.o., male MRN: [id number] DOB: [birth year] Height: 5\' 8"  (172.7 cm) Weight: 92.7 kg (204 lb 5.9 oz)              Insurance Information HMO:     PPO:      PCP:      IPA:      80/20:      OTHER:  PRIMARY:  Self pay on admission   Medicaid Application Date: 10/1 MAD retro application completed      Case Manager: tba Disability Application Date:       Case Worker:   Emergency Contact Information Contact Information   Name Relation Home Work Mobile   Alan,Naveed Relative [phone]     S,Iram Spouse   [phone]     Current Medical History  Patient Admitting Diagnosis: SAH  History of Present Illness: Richard Davenport is a 52 y.o. male admitted on 10/08/12 with unresponsiveness due to diffuse SAH in the basal cisterns and bilateral sylvian fissures with some degree of intraventricular hemorrhage. 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 Conchita Paris. Patient self extubated on 10/10/12 and was placed on regular diet as no evidence of dysphagia reported per ST evaluation. He was noted to be febrile on 09/23 and was started on empiric antibiotics. ID consulted due to continued fevers despite negative BC and CSF. LUE duplex positive for superficial vein thrombosis involving the left basilic vein, solely in the antecubital fossa. ID recommended discontinuing antibiotics as fevers felt to be central due to CNS. Hyponatremia treated with florinef as well as normal saline with improvement. Headaches as well as confusion is resolving. Follow up CCT on 10/2 showed continued resolution of SAH, no cortical infarct or hydrocephalus. EVD removed on 10/02 and PT, OT evaluations done today. Patient impulsive with poor safety as well as unsteady gait.   Total: 0    Past Medical History  Past Medical History  Diagnosis Date   . Headache     Family History  family history is not on file.  Prior Rehab/Hospitalizations: none  Current Medications  Current facility-administered medications:0.9 %  sodium chloride infusion, , Intravenous, Continuous, Lisbeth Renshaw, MD, Last Rate: 125 mL/hr at 10/27/12 0504;  acetaminophen (TYLENOL) suppository 650 mg, 650 mg, Rectal, Q4H PRN, Reinaldo Meeker, MD, 650 mg at 10/17/12 0005;  acetaminophen (TYLENOL) tablet 650 mg, 650 mg, Oral, Q4H PRN, Reinaldo Meeker, MD, 650 mg at 10/27/12 0601 alum & mag hydroxide-simeth (MAALOX/MYLANTA) 200-200-20 MG/5ML suspension 30 mL, 30 mL, Oral, BID PRN, Lisbeth Renshaw, MD, 30 mL at 10/16/12 1514;  feeding supplement (ENSURE COMPLETE) liquid 237 mL, 237 mL, Oral, BID BM, Heather Cornelison Pitts, RD, 237 mL at 10/27/12 1000;  HYDROmorphone (DILAUDID) injection 0.5 mg, 0.5 mg, Intravenous, Q2H PRN, Reinaldo Meeker, MD, 0.5 mg at 10/26/12 0041 insulin aspart (novoLOG) injection 0-9 Units, 0-9 Units, Subcutaneous, TID WC, Nelda Bucks, MD, 1 Units at 10/27/12 [phone];  labetalol (NORMODYNE,TRANDATE) injection 10-40 mg, 10-40 mg, Intravenous, Q10 min PRN, Reinaldo Meeker, MD, 20 mg at 10/16/12 1305;  levETIRAcetam (KEPPRA) tablet 500 mg, 500 mg, Oral, BID, Lisbeth Renshaw, MD, 500 mg at 10/27/12 0958 niMODipine (NIMOTOP) capsule 60 mg, 60 mg, Oral, Q4H, Clydene Fake, MD, 60 mg at 10/27/12 0957;  ondansetron (ZOFRAN) injection 4 mg, 4 mg, Intravenous, Q4H PRN, Alyson Reedy, MD, 4 mg at  10/20/12 0657;  pantoprazole (PROTONIX) EC tablet 40 mg, 40 mg, Oral, QHS, Lisbeth Renshaw, MD, 40 mg at 10/26/12 2101;  senna-docusate (Senokot-S) tablet 1 tablet, 1 tablet, Oral, BID, Reinaldo Meeker, MD, 1 tablet at 10/27/12 4098 simvastatin (ZOCOR) tablet 80 mg, 80 mg, Oral, q1800, Kalman Shan, MD, 80 mg at 10/26/12 1658  Patients Current Diet: Vegetarian  Precautions / Restrictions Precautions Precautions: Fall Restrictions Weight Bearing  Restrictions: No   Prior Activity Level Community (5-7x/wk): active; does car inpsections as needed  Journalist, newspaper / Equipment Home Assistive Devices/Equipment: None Home Equipment: None  Prior Functional Level 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 Patient stated that he slept well last night and came into the hospital today at 10 am. Patient also stated that he went out to eat for lunch and is not hungry anymore.    Extremity Assessment (includes Sensation/Coordination)          ADLs  Eating/Feeding: Supervision/safety Where Assessed - Eating/Feeding: Chair Grooming: Minimal assistance Where Assessed - Grooming: Supported standing Upper Body Bathing: Minimal assistance Where Assessed - Upper Body Bathing: Supported standing Lower Body Bathing: Moderate assistance Where Assessed - Lower Body Bathing: Supported sit to stand Upper Body Dressing: Minimal assistance Where Assessed - Upper Body Dressing: Supported sit to stand Lower Body Dressing: Minimal assistance Where Assessed - Lower Body Dressing: Supported sit to stand Toilet Transfer: Minimal assistance;Moderate assistance Toilet Transfer: Patient Percentage: 70% Toilet Transfer Method: Sit to stand;Stand pivot Toilet Transfer Equipment: Comfort height toilet Toileting - Clothing Manipulation and Hygiene: Minimal assistance Where Assessed - Toileting Clothing Manipulation and Hygiene: Sit to stand from 3-in-1 or  toilet Equipment Used: Gait belt Transfers/Ambulation Related to ADLs: min A for ambulation, but when Loses balance, requires mod A to recover ADL Comments: Pt with poor awareness of deficits    Mobility  Bed Mobility: Supine to Sit;Sitting - Scoot to Edge of Bed Supine to Sit: 5: Supervision Sitting - Scoot to Edge of Bed: 5: Supervision Sit to Supine: 4: Min guard    Transfers  Transfers: Sit to Stand;Stand to Sit Sit to Stand: 4: Min assist;With upper extremity assist;From bed;From chair/3-in-1 Stand to Sit: 4: Min assist;With upper extremity assist;To chair/3-in-1    Ambulation / Gait / Stairs / Psychologist, prison and probation services  Ambulation/Gait Ambulation/Gait Assistance: 4: Min assist;3: Mod assist Ambulation Distance (Feet): 420 Feet Assistive device: Rolling walker;1 person hand held assist Ambulation/Gait Assistance Details: patient very unsteady with ambulation, scissoring gait with noted multiple LOB with and without device requiring increased assist to prevent complete fall.  Gait Pattern: Decreased stride length;Step-through pattern Stairs: No Wheelchair Mobility Wheelchair Mobility: No    Posture / Balance Static Sitting Balance Static Sitting - Balance Support: No upper extremity supported Static Sitting - Level of Assistance: 5: Stand by assistance Static Standing Balance Static Standing - Balance Support: During functional activity Static Standing - Level of Assistance: 3: Mod assist Static Standing - Comment/# of Minutes: 4 Single Leg Stance - Right Leg:  (could not perform without assist) Single Leg Stance - Left Leg:  (could not perform without assist) Tandem Stance - Right Leg:  (less than 3 seconds with eyes open) Tandem Stance -  Left Leg:  (less than 3 seconds with eyes open) Dynamic Standing Balance Dynamic Standing - Balance Support: No upper extremity supported Dynamic Standing - Level of Assistance: 4: Min assist;3: Mod assist Dynamic Standing - Balance  Activities: Other (comment) (ADLs ) Dynamic Standing - Comments: Loses balance requiring mod A to recover Dynamic Gait Index Level Surface: Mild Impairment Change in Gait Speed: Mild Impairment Gait with Horizontal Head Turns: Moderate Impairment Gait with Vertical Head Turns: Mild Impairment Gait and Pivot Turn: Severe Impairment Step Over Obstacle: Severe Impairment Step Around Obstacles: Moderate Impairment Steps: Moderate Impairment Total Score: 9 High Level Balance High Level Balance Activites: Side stepping;Direction changes;Turns;Sudden stops;Head turns High Level Balance Comments: min to mod assist, unable to self correct balance checks    Special needs/care consideration Bowel mgmt: continent Bladder mgmt: continent   Previous Home Environment Living Arrangements: Spouse/significant other;Children;Other (Comment) (second wife, 2 young schoolage children in the home)  Lives With: Spouse;Family;Other (Comment) (2 older daughters in shool at [employer]) Available Help at Discharge: Family;Available 24 hours/day (unsure - per pt report) Type of Home: Apartment Home Layout: One level Home Access: Level entry Bathroom Shower/Tub: Engineer, manufacturing systems: Standard Bathroom Accessibility: Yes How Accessible: Accessible via walker Home Care Services: No Additional Comments: info verified with wife and cousin  Discharge Living Setting Plans for Discharge Living Setting: Patient's home;Lives with (comment);Apartment (wife and 2 younger children) Type of Home at Discharge: Apartment Discharge Home Layout: One level Discharge Home Access: Level entry Discharge Bathroom Shower/Tub: Tub/shower unit Discharge Bathroom Toilet: Standard Discharge Bathroom Accessibility: Yes How Accessible: Accessible via walker Does the patient have any problems obtaining your medications?: No  Social/Family/Support Systems Patient Roles: Spouse;Parent;Other (Comment) Equities trader) Contact  Information: Fabiola Backer, wife and Ulice Bold, cousin Anticipated Caregiver: wife works days; Maree Krabbe states family can help Anticipated Caregiver's Contact Information: [place] cell [phone] Krabbe [phone] Ability/Limitations of Caregiver: wife works days Caregiver Availability: 24/7 Discharge Plan Discussed with Primary Caregiver: Yes Is Caregiver In Agreement with Plan?: Yes Does Caregiver/Family have Issues with Lodging/Transportation while Pt is in Rehab?: No    Goals/Additional Needs Patient/Family Goal for Rehab: Mod I to supervision with PT, OT, and SLP Expected length of stay: ELOS 1 to 2 weeks; likely 1 week Cultural Considerations: [place]. Here since 1990. Wife here since 2007 Dietary Needs: pt does not eat alot of our food. Family has been bringing in food Pt/Family Agrees to Admission and willing to participate: Yes Program Orientation Provided & Reviewed with Pt/Caregiver Including Roles  & Responsibilities: Yes   Decrease burden of Care through IP rehab admission: n/a  Possible need for SNF placement upon discharge:n/a  Patient Condition: This patient's condition remains as documented in the consult dated  10/26/12, in which the Rehabilitation Physician determined and documented that the patient's condition is appropriate for intensive rehabilitative care in an inpatient rehabilitation facility. Will admit to inpatient rehab today.  Preadmission Screen Completed By:  Clois Dupes, 10/27/2012 12:27 PM ______________________________________________________________________   Discussed status with Dr. Riley Kill on 10/27/12 at  1227 and received telephone approval for admission today.  Admission Coordinator:  Clois Dupes, time [id number] Date 10/27/12.

## 2012-10-27 NOTE — Discharge Summary (Signed)
   Physician Discharge Summary  Patient ID: Richard Davenport MRN: [id number] DOB/AGE: 05-16-1960 52 y.o.  Admit date: 10/08/2012 Discharge date: 10/27/2012  Admission Diagnoses: Subarachnoid Hemorrhage  Discharge Diagnoses: Same Principal Problem:   SAH (subarachnoid hemorrhage) Active Problems:   Altered mental status   Acute respiratory failure with hypoxia   Discharged Condition: Stable  Hospital Course:  [name] is a 52 y.o. man who presented to [facility] with altered mental status as a transfer from an outside hospital. CT scan demonstrated subarachnoid hemorrhage. 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 He was returned to the neuro-ICU where he was found postoperatively to be neurologically intact. He was extubated and monitored in the ICU for vasospasm with a largely uncomplicated ICU stay. No clinical vasospasm was observed. He was then transferred to the neuroscience floor where he was seen and evaluated by PT and OT and found to be a good candidate for inpatient rehabilitation, and requiring 24hr supervision. He was seen by myself and found to be medically stable for discharge.  Treatments: Surgery - Coil embolization of basilar apex aneurysm  Discharge Exam: Blood pressure 106/70, pulse 82, temperature 97.9 F (36.6 C), temperature source Oral, resp. rate 18, height 5\' 8"  (1.727 m), weight 92.7 kg (204 lb 5.9 oz), SpO2 98.00%. Awake, alert, oriented Speech fluent, appropriate CN grossly intact 5/5 BUE/BLE Scalp wounds c/d/i  Follow-up: F/U in my office [facility], [phone]) in 1 month. Scalp staples / stitches can be removed in 1 week.  Disposition: Discharge to inpatient rehab  Discharge Orders   Future Orders Complete By Expires   Discharge patient  As directed        Medication List         feeding supplement (ENSURE COMPLETE) Liqd  Take 237 mLs by mouth 2 (two)  times daily between meals.     HYDROcodone-acetaminophen 5-325 MG per tablet  Commonly known as:  NORCO/VICODIN  Take 1 tablet by mouth every 6 (six) hours as needed for pain.     simvastatin 10 MG tablet  Commonly known as:  ZOCOR  Take 1 tablet (10 mg total) by mouth at bedtime.         SignedLisbeth Renshaw, C 10/27/2012, 10:58 AM

## 2012-10-27 NOTE — Progress Notes (Signed)
Received pt. As a transfer from [facility]. Pt. Alert and oriented.Pt. From home with wife.Pt. Skin is intact with out pressure sore,pt. Has a incision open to air on right scalp.Pt. Was not using any equipment at home prior this admission.Keep monitoring pt. And assessing his needs.

## 2012-10-28 ENCOUNTER — Inpatient Hospital Stay (HOSPITAL_COMMUNITY): Payer: Medicaid Other

## 2012-10-28 ENCOUNTER — Inpatient Hospital Stay (HOSPITAL_COMMUNITY): Payer: Medicaid Other | Admitting: Speech Pathology

## 2012-10-28 DIAGNOSIS — I10 Essential (primary) hypertension: Secondary | ICD-10-CM

## 2012-10-28 DIAGNOSIS — I609 Nontraumatic subarachnoid hemorrhage, unspecified: Secondary | ICD-10-CM

## 2012-10-28 LAB — CBC WITH DIFFERENTIAL/PLATELET
Eosinophils Relative: 4 % (ref 0–5)
HCT: 35.9 % — ABNORMAL LOW (ref 39.0–52.0)
Lymphocytes Relative: 28 % (ref 12–46)
Lymphs Abs: 1.9 10*3/uL (ref 0.7–4.0)
MCHC: 35.1 g/dL (ref 30.0–36.0)
MCV: 97.3 fL (ref 78.0–100.0)
Monocytes Absolute: 0.4 10*3/uL (ref 0.1–1.0)
Neutro Abs: 4.2 10*3/uL (ref 1.7–7.7)
RBC: 3.69 MIL/uL — ABNORMAL LOW (ref 4.22–5.81)
WBC: 6.8 10*3/uL (ref 4.0–10.5)

## 2012-10-28 LAB — COMPREHENSIVE METABOLIC PANEL
AST: 19 U/L (ref 0–37)
Alkaline Phosphatase: 120 U/L — ABNORMAL HIGH (ref 39–117)
BUN: 13 mg/dL (ref 6–23)
CO2: 27 mEq/L (ref 19–32)
Calcium: 9 mg/dL (ref 8.4–10.5)
Chloride: 101 mEq/L (ref 96–112)
Creatinine, Ser: 0.97 mg/dL (ref 0.50–1.35)
GFR calc Af Amer: >90 mL/min (ref >90)
GFR calc non Af Amer: >90 mL/min (ref >90)
Glucose, Bld: 90 mg/dL (ref 70–99)
Potassium: 3.9 mEq/L (ref 3.5–5.1)
Total Bilirubin: 0.2 mg/dL — ABNORMAL LOW (ref 0.3–1.2)

## 2012-10-28 LAB — GLUCOSE, CAPILLARY
Glucose-Capillary: 88 mg/dL (ref 70–99)
Glucose-Capillary: 88 mg/dL (ref 70–99)
Glucose-Capillary: 119 mg/dL — ABNORMAL HIGH (ref 70–99)

## 2012-10-28 NOTE — Interval H&P Note (Signed)
Richard Davenport was admitted yesterday to Inpatient Rehabilitation with the diagnosis of SAH.  The patient's history has been reviewed, patient examined, and there is no change in status.  Patient continues to be appropriate for intensive inpatient rehabilitation.  I have reviewed the patient's chart and labs.  Questions were answered to the patient's satisfaction.  This encounter and document were completed on 10/27/12 and are being place in the rehab encounter for the purpose of charting.  Lafreda Casebeer T 10/28/2012, 7:47 AM

## 2012-10-28 NOTE — Evaluation (Signed)
Speech Language Pathology Assessment and Plan  Patient Details  Name: Richard Davenport MRN: [id number] Date of Birth: 1960-04-29  SLP Diagnosis: Cognitive Impairments  Rehab Potential: Excellent ELOS: 1 week   Today's Date: 10/28/2012 Time: 4098-1191 Time Calculation (min): 55 min  Skilled Therapeutic Intervention: Administered cognitive-linguistic evaluation. Please see below for details. Pt educated on current cognitive impairments and goals of skilled SLP intervention. Pt verbalized understanding.   Problem List:  Patient Active Problem List   Diagnosis Date Noted  . Acute respiratory failure with hypoxia 10/10/2012  . SAH (subarachnoid hemorrhage) 10/09/2012  . Altered mental status 10/09/2012   Past Medical History:  Past Medical History  Diagnosis Date  . Headache    Past Surgical History:  Past Surgical History  Procedure Laterality Date  . Radiology with anesthesia N/A 10/09/2012    Procedure: RADIOLOGY WITH ANESTHESIA;  Surgeon: Lisbeth Renshaw, MD;  Location: [facility] OR;  Service: Radiology;  Laterality: N/A;    Assessment / Plan / Recommendation Clinical Impression  Pt is a 52 y.o. male admitted on 10/08/12 with unresponsiveness due to diffuse SAH in the basal cisterns and bilateral sylvian fissures with some degree of intraventricular hemorrhage. 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 Conchita Paris. Patient self extubated on 10/10/12 and was placed on regular diet as no evidence of dysphagia reported per ST evaluation. He was noted to be febrile on 09/23 and was started on empiric antibiotics. ID consulted due to continued fevers despite negative BC and CSF. LUE duplex positive for superficial vein thrombosis involving the left basilic vein, solely in the antecubital fossa. ID recommended discontinuing antibiotics as fevers felt to be central due to CNS. Hyponatremia treated  with florinef as well as normal saline with improvement. Headaches as well as confusion is resolving. Follow up CCT on 10/2 showed continued resolution of SAH, no cortical infarct or hydrocephalus. EVD removed on 10/02 and therapies initiated. He continues with headaches, dizziness, cognitive and balance deficits and CIR recommended for progression. Patient transferred to CIR on 10/27/2012 and presents with cognitive impairments in the areas of working memory, alternating attention, emergent awareness, problem solving, safety awareness and reasoning which impact pt's overall ability to perform ADL's safely. Pt also presented with decreased auditory comprehension of complex information, suspect due to attention and working memory. Pt would benefit from skilled SLP intervention to maximize cognitive function and overall functional independence prior to discharge home.     SLP Assessment  Patient will need skilled Speech Lanaguage Pathology Services during CIR admission    Recommendations  Diet Recommendations: Regular;Thin liquid Liquid Administration via: Cup;Straw Medication Administration: Whole meds with liquid Supervision: Patient able to self feed Compensations: Slow rate;Small sips/bites Postural Changes and/or Swallow Maneuvers: Seated upright 90 degrees Oral Care Recommendations: Oral care BID Recommendations for Other Services: Neuropsych consult Patient destination: Home Follow up Recommendations: Outpatient SLP (amount of supervision is TBD) Equipment Recommended: None recommended by SLP    SLP Frequency 5 out of 7 days   SLP Treatment/Interventions Cognitive remediation/compensation;Cueing hierarchy;Functional tasks;Environmental controls;Internal/external aids;Patient/family education;Therapeutic Activities    Pain Pain Assessment Pain Assessment: No/denies pain Prior Functioning Type of Home: Apartment  Lives With: Spouse;Family;Other (Comment) Available Help at Discharge:  Family;Available 24 hours/day Vocation: Full time employment  Short Term Goals: Week 1: SLP Short Term Goal 1 (Week 1): Pt will utilize call bell to express wants/needs with Mod I.  SLP Short Term Goal 2 (Week  1): Pt will demonstrate functional problem solving for complex tasks with supervision verbal and question cues.  SLP Short Term Goal 3 (Week 1): Pt will utilize external memory aids to recall new, daily information with supervision verbal and visual cues.  SLP Short Term Goal 4 (Week 1): Pt will demonstrate alternating attention between two tasks for 30 minutes with supervision verbal cues for redirection.  SLP Short Term Goal 5 (Week 1): Pt will follow multistep commands with 100% accuracy and Min verbal and question cues.   See FIM for current functional status Refer to Care Plan for Long Term Goals  Recommendations for other services: Neuropsych  Discharge Criteria: Patient will be discharged from SLP if patient refuses treatment 3 consecutive times without medical reason, if treatment goals not met, if there is a change in medical status, if patient makes no progress towards goals or if patient is discharged from hospital.  The above assessment, treatment plan, treatment alternatives and goals were discussed and mutually agreed upon: by patient  Richard Davenport 10/28/2012, 2:47 PM

## 2012-10-28 NOTE — Progress Notes (Signed)
Subjective/Complaints: Had a headache this morning which responded to oxycodone. Otherwise did well last night.  A 12 point review of systems has been performed and if not noted above is otherwise negative.   Objective: Vital Signs: Blood pressure 123/76, pulse 84, temperature 98.6 F (37 C), temperature source Oral, resp. rate 18, SpO2 97.00%. No results found.  Recent Labs  10/28/12 0705  WBC 6.8  HGB 12.6*  HCT 35.9*  PLT 194    Recent Labs  10/26/12 2311 10/27/12 1025  NA 138 137   CBG (last 3)   Recent Labs  10/27/12 1630 10/27/12 2103 10/28/12 0720  GLUCAP 133* 106* 88    Wt Readings from Last 3 Encounters:  10/18/12 92.7 kg (204 lb 5.9 oz)  10/18/12 92.7 kg (204 lb 5.9 oz)    Physical Exam:  Nursing note and vitals reviewed.  Constitutional: He appears well-developed and well-nourished.  HENT:  Head: Normocephalic and atraumatic.  Eyes: Conjunctivae are normal. Pupils are equal, round, and reactive to light.  Neck: Normal range of motion. Neck supple.  Cardiovascular: Normal rate and regular rhythm.  No murmur heard.  Pulmonary/Chest: Effort normal and breath sounds normal. No respiratory distress. He has no wheezes.  Abdominal: Soft. Bowel sounds are normal. He exhibits no distension. There is no tenderness.  Musculoskeletal: He exhibits no edema and no tenderness.  Neurological: He is alert.  Oriented to self and place with cues.  Problems with short term recall. Speech clear and able to follow one and two step commands without difficulty. Strength remains symmetrical but generally 4/5 proximal to distal, lower and upper. Carries conversation, fluid speech.  Skin: Skin is warm and dry.  Psychiatric: He has a normal mood and affect. His speech is normal and behavior is normal. Cognition and memory are impaired. He expresses inappropriate judgment.    Assessment/Plan: 1. 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 Physiatrist is providing close team supervision and 24 hour management of active medical problems listed below. Physiatrist and rehab team continue to assess barriers to discharge/monitor patient progress toward functional and medical goals. FIM:                   Comprehension Comprehension Mode: Auditory Comprehension: 5-Follows basic conversation/direction: With extra time/assistive device  Expression Expression Mode: Verbal Expression: 5-Expresses basic needs/ideas: With extra time/assistive device  Social Interaction Social Interaction: 5-Interacts appropriately 90% of the time - Needs monitoring or encouragement for participation or interaction.  Problem Solving Problem Solving: 5-Solves basic problems: With no assist  Memory Memory: 4-Recognizes or recalls 75 - 89% of the time/requires cueing 10 - 24% of the time  Medical Problem List and Plan:  1. DVT Prophylaxis/Anticoagulation: Mechanical: Sequential compression devices, below knee Bilateral lower extremities  2. Headaches/Pain Management: Will continue prn medications for headaches.  3. Mood: No signs of distress. Will have LCSW follow for evaluation.  4. Neuropsych: This patient is not capable of making decisions on his own behalf.  5. Seizure prophylaxis: Keppra and nimotop completed 6. ABLA : hgb 12.6   LOS (Days) 1 A FACE TO FACE EVALUATION WAS PERFORMED  Pooja Camuso T 10/28/2012 8:16 AM

## 2012-10-28 NOTE — Evaluation (Signed)
Physical Therapy Assessment and Plan  Patient Details  Name: Richard Davenport MRN: [id number] Date of Birth: 1960/04/23  PT Diagnosis: Abnormality of gait, decreased balance, cognitive deficits Rehab Potential: Good ELOS: 5-7days   Today's Date: 10/28/2012 Time: 0830-0925 Time Calculation (min): 55 min  Problem List:  Patient Active Problem List   Diagnosis Date Noted  . Acute respiratory failure with hypoxia 10/10/2012  . SAH (subarachnoid hemorrhage) 10/09/2012  . Altered mental status 10/09/2012    Past Medical History:  Past Medical History  Diagnosis Date  . Headache    Past Surgical History:  Past Surgical History  Procedure Laterality Date  . Radiology with anesthesia N/A 10/09/2012    Procedure: RADIOLOGY WITH ANESTHESIA;  Surgeon: Lisbeth Renshaw, MD;  Location: [facility] OR;  Service: Radiology;  Laterality: N/A;    Assessment & Plan Clinical Impression: Richard Davenport is a 52 y.o. male admitted on 10/08/12 with unresponsiveness due to diffuse SAH in the basal cisterns and  bilateral sylvian fissures with some degree of intraventricular hemorrhage. 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 Conchita Paris. Patient self extubated on 10/10/12 and was placed on regular diet as no evidence of dysphagia reported per ST evaluation. He was noted to be febrile on 09/23 and was started on empiric antibiotics. ID consulted due to continued fevers despite negative BC and CSF. LUE duplex positive for superficial vein thrombosis involving the left basilic vein, solely in the antecubital fossa. ID recommended discontinuing antibiotics as fevers felt to be central due to CNS. Hyponatremia treated with florinef as well as normal saline with improvement. Headaches as well as confusion is resolving. Follow up CCT on 10/2 showed continued resolution of SAH, no cortical infarct or hydrocephalus. EVD removed  on 10/02 and therapies initiated. He continues with headaches, dizziness, cognitive and balance deficits and CIR recommended for progression. Patient transferred to CIR on 10/27/2012 .   Patient currently requires min with mobility secondary to decreased B LE coordination and decreased balance and decreased attention, decreased awareness and decreased memory.  Prior to hospitalization, patient was independent  with mobility and lived with Spouse;Family;Other (Comment) in a Apartment home.  Home access is  Level entry.  Patient will benefit from skilled PT intervention to maximize safe functional mobility, minimize fall risk and decrease caregiver burden for planned discharge home with intermittent assist.  Anticipate patient will benefit from follow up OP at discharge.  PT - End of Session Endurance Deficit: Yes PT Assessment Rehab Potential: Good PT Patient demonstrates impairments in the following area(s): Balance;Endurance;Motor;Safety PT Transfers Functional Problem(s): Bed Mobility;Bed to Chair;Car;Furniture;Floor PT Locomotion Functional Problem(s): Ambulation;Stairs PT Plan PT Intensity: Minimum of 1-2 x/day ,45 to 90 minutes PT Frequency: 5 out of 7 days PT Duration Estimated Length of Stay: 5-7days PT Treatment/Interventions: Ambulation/gait training;Balance/vestibular training;Cognitive remediation/compensation;Functional mobility training;Patient/family education;Therapeutic Exercise;Therapeutic Activities;UE/LE Coordination activities;UE/LE Strength taining/ROM;Stair training;Neuromuscular Engineer, materials PT Transfers Anticipated Outcome(s): Mod(I) to (S) PT Locomotion Anticipated Outcome(s): Mod(I) to (S), ambulatory level PT Recommendation Follow Up Recommendations: Outpatient PT; intermittent supervision/assistance Equipment Recommended: None recommended by PT Equipment Details: Do not anticipate that pt will require mobility related DME at this  time  Skilled Therapeutic Intervention 1:1. Pt received semi-reclined in bed, alert and agreeable to therapy. PT evaluation performed and tx initiated, see detailed objective information below. Pt oriented to person and situation, but not time and place. Pt w/ decreased recall that he told therapist about his  daughters at start of session, repeated information about daughters intermittently throughout tx session. However, pt demonstrated good recall of locating room at end of tx session w/ min verbal cues. Pt req (S) for bed mobility and min guard to min A during all stand pivot transfers and standing functional mobility w/ use of R HHA. (S) for w/c propulsion w/ B UE and/or LE. Berg balance test performed, pt scored 38/56 and educated on results. Pt w/ good tolerance to tx session overall, supine in bed at end of tx session w/ all needs in reach, bed alarm on.   PT Evaluation Precautions/Restrictions Precautions Precautions: Fall Restrictions Weight Bearing Restrictions: No General Chart Reviewed: Yes Family/Caregiver Present: No Vital Signs  Pain Pain Assessment Pain Assessment: No/denies pain Home Living/Prior Functioning Home Living Available Help at Discharge: Family;Available 24 hours/day Type of Home: Apartment Home Access: Level entry Home Layout: One level  Lives With: Spouse;Family;Other (Comment) Prior Function Level of Independence: Independent with gait;Independent with homemaking with ambulation;Independent with basic ADLs;Independent with transfers  Able to Take Stairs?: Yes Driving: Yes Vocation: Full time employment Vocation Requirements: Curator Vision/Perception  Vision - History Baseline Vision: Wears glasses only for reading Patient Visual Report: No change from baseline Vision - Assessment Eye Alignment: Within Functional Limits Perception Perception: Within Functional Limits Praxis Praxis: Intact  Cognition Overall Cognitive Status: Impaired/Different  from baseline Orientation Level: Oriented to person;Oriented to situation;Disoriented to place;Disoriented to time Attention: Selective;Alternating Selective Attention: Appears intact Alternating Attention: Impaired Alternating Attention Impairment: Functional complex;Verbal complex Memory: Impaired Memory Impairment: Decreased short term memory;Decreased recall of new information Decreased Short Term Memory: Functional complex;Verbal complex Awareness: Impaired Awareness Impairment: Anticipatory impairment Problem Solving: impaired (functional and verbal complex) Executive Function: Self Monitoring Self Monitoring: impaired (functional and verbal complex) Safety/Judgment: Impaired Sensation Sensation Light Touch: Appears Intact Stereognosis: Appears Intact Hot/Cold: Appears Intact Proprioception: Appears Intact Coordination Gross Motor Movements are Fluid and Coordinated:  (Very intermittent scissoring noted in L LE during amb) Fine Motor Movements are Fluid and Coordinated: Yes Motor  Motor Motor: Within Functional Limits  Mobility Bed Mobility Bed Mobility: Supine to Sit;Sit to Supine Supine to Sit: 5: Supervision Supine to Sit Details: Verbal cues for precautions/safety Sit to Supine: 5: Supervision Sit to Supine - Details: Verbal cues for precautions/safety Transfers Transfers: Yes Sit to Stand: 4: Min guard;From bed;With armrests;With upper extremity assist;Without upper extremity assist Sit to Stand Details: Verbal cues for precautions/safety;Verbal cues for sequencing Stand to Sit: 4: Min guard;To bed;With upper extremity assist;Without upper extremity assist Stand Pivot Transfers: 4: Min assist Stand Pivot Transfer Details: Verbal cues for precautions/safety;Verbal cues for sequencing Locomotion  Ambulation Ambulation: Yes Ambulation/Gait Assistance: 4: Min assist Ambulation Distance (Feet): 350 Feet Assistive device: Rolling walker;1 person hand held  assist Ambulation/Gait Assistance Details: Verbal cues for precautions/safety Ambulation/Gait Assistance Details: Pt amb initial 150' w/ RW and min guard, then able to progress to R HHA w/ min A. Intermittent scissoring of L LE over R LE noted. No overt LOB observed. Pt demonstrates decreased amb speed, but steady pace.  Gait Gait: Yes Gait Pattern: Scissoring;Lateral trunk lean to right;Narrow base of support;Decreased stride length Gait velocity: decreased, but steady pace High Level Ambulation High Level Ambulation: Other high level ambulation High Level Ambulation - Other Comments: trial speed changes, pt only able to demonstrate very minimal change in speed when asked to amb more quickly Stairs / Additional Locomotion Stairs: Yes Stairs Assistance: 4: Min guard Stair Management Technique: Alternating pattern;One rail Right Number  of Stairs: 8 Height of Stairs: 6.5 Wheelchair Mobility Wheelchair Mobility: Yes Wheelchair Assistance: 5: Supervision Wheelchair Assistance Details: Verbal cues for sequencing;Verbal cues for precautions/safety;Verbal cues for Diplomatic Services operational officer: Both upper extremities;Both lower extermities Wheelchair Parts Management: Supervision/cueing Distance: 150'  Trunk/Postural Assessment  Cervical Assessment Cervical Assessment: Within Functional Limits Thoracic Assessment Thoracic Assessment: Within Functional Limits Lumbar Assessment Lumbar Assessment: Within Functional Limits Postural Control Postural Control: Deficits on evaluation Righting Reactions: With higher level balance reactions, pt demonstrates consistent LOB in posterior direction. Pt able to verbalize direction of LOB accurately. Ankle strategy present, but decreased speed of correction  Balance Balance Balance Assessed: Yes Standardized Balance Assessment Standardized Balance Assessment: Berg Balance Test Berg Balance Test Sit to Stand: Able to stand without using hands and  stabilize independently Standing Unsupported: Able to stand 2 minutes with supervision Sitting with Back Unsupported but Feet Supported on Floor or Stool: Able to sit safely and securely 2 minutes Stand to Sit: Sits safely with minimal use of hands Transfers: Able to transfer safely, minor use of hands Standing Unsupported with Eyes Closed: Able to stand 3 seconds Standing Ubsupported with Feet Together: Able to place feet together independently but unable to hold for 30 seconds From Standing, Reach Forward with Outstretched Arm: Can reach forward >12 cm safely (5") From Standing Position, Pick up Object from Floor: Able to pick up shoe, needs supervision From Standing Position, Turn to Look Behind Over each Shoulder: Turn sideways only but maintains balance Turn 360 Degrees: Needs close supervision or verbal cueing Standing Unsupported, Alternately Place Feet on Step/Stool: Able to complete 4 steps without aid or supervision Standing Unsupported, One Foot in Front: Able to plae foot ahead of the other independently and hold 30 seconds Standing on One Leg: Tries to lift leg/unable to hold 3 seconds but remains standing independently Total Score: 38 Static Sitting Balance Static Sitting - Balance Support: No upper extremity supported;Feet supported Static Sitting - Level of Assistance: 5: Stand by assistance Dynamic Sitting Balance Dynamic Sitting - Balance Support: Left upper extremity supported;Right upper extremity supported;No upper extremity supported;Feet supported Dynamic Sitting - Level of Assistance: 5: Stand by assistance Dynamic Sitting - Balance Activities: Lateral lean/weight shifting;Forward lean/weight shifting;Reaching for objects Static Standing Balance Static Standing - Balance Support: No upper extremity supported Static Standing - Level of Assistance: 5: Stand by assistance;4: Min assist Static Standing - Comment/# of Minutes: Pt req min guard to min A for static balance  w/ EC or narrow BOS due to posterior LOB, however, min guard to close (S) when EO and feet in natural BOS Static Stance: Eyes closed Dynamic Standing Balance Dynamic Standing - Balance Support: No upper extremity supported Dynamic Standing - Level of Assistance: 4: Min assist Dynamic Standing - Balance Activities: Forward lean/weight shifting;Lateral lean/weight shifting;Reaching across midline;Reaching for weighted objects;Reaching for objects Dynamic Standing - Comments: Pt demonstrates minor episodes of LOB, mostly posteriorly during higher level balance challenges, min A req for correction 25% of time Extremity Assessment  RUE Assessment RUE Assessment: Within Functional Limits LUE Assessment LUE Assessment: Within Functional Limits RLE Assessment RLE Assessment: Within Functional Limits RLE Strength RLE Overall Strength Comments: Grossly 4+/5 LLE Assessment LLE Assessment: Within Functional Limits LLE Strength LLE Overall Strength Comments: Grossly 4+/5  FIM:  FIM - Bed/Chair Transfer Bed/Chair Transfer: 5: Supine > Sit: Supervision (verbal cues/safety issues);5: Sit > Supine: Supervision (verbal cues/safety issues);4: Chair or W/C > Bed: Min A (steadying Pt. > 75%);4: Bed > Chair or W/C: Min  A (steadying Pt. > 75%) FIM - Locomotion: Wheelchair Distance: 150' Locomotion: Wheelchair: 5: Travels 150 ft or more: maneuvers on rugs and over door sills with supervision, cueing or coaxing FIM - Locomotion: Ambulation Locomotion: Ambulation Assistive Devices: Walker - Rolling;Other (comment) (HHA) Ambulation/Gait Assistance: 4: Min assist Locomotion: Ambulation: 4: Travels 150 ft or more with minimal assistance (Pt.>75%) FIM - Locomotion: Stairs Locomotion: Building control surveyor: Hand rail - 1 Locomotion: Stairs: 2: Up and Down 4 - 11 stairs with minimal assistance (Pt.>75%)   Refer to Care Plan for Long Term Goals  Recommendations for other services: None  Discharge Criteria:  Patient will be discharged from PT if patient refuses treatment 3 consecutive times without medical reason, if treatment goals not met, if there is a change in medical status, if patient makes no progress towards goals or if patient is discharged from hospital.  The above assessment, treatment plan, treatment alternatives and goals were discussed and mutually agreed upon: by patient  Denzil Hughes 10/28/2012, 12:52 PM

## 2012-10-28 NOTE — H&P (View-Only) (Signed)
Physical Medicine and Rehabilitation Admission H&P    Chief Complaint  Patient presents with  . 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 male admitted on 10/08/12 with unresponsiveness due to diffuse SAH in the basal cisterns and  bilateral sylvian fissures with some degree of intraventricular hemorrhage. 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 Conchita Paris. Patient self extubated on 10/10/12 and was placed on regular diet as no evidence of dysphagia reported per ST evaluation. He was noted to be febrile on 09/23 and was started on empiric antibiotics. ID consulted due to continued fevers despite negative BC and CSF. LUE duplex positive for superficial vein thrombosis involving the left basilic vein, solely in the antecubital fossa. ID recommended discontinuing antibiotics as fevers felt to be central due to CNS. Hyponatremia treated with florinef as well as normal saline with improvement. Headaches as well as confusion is resolving. Follow up CCT on 10/2 showed continued resolution of SAH, no cortical infarct or hydrocephalus. EVD removed on 10/02 and therapies initiated. He continues with headaches, dizziness, cognitive and balance deficits and CIR recommended for progression.    Review of Systems  HENT: Negative for hearing loss and neck pain.   Eyes: Negative for double vision and photophobia.  Respiratory: Negative for cough and shortness of breath.   Cardiovascular: Negative for chest pain and orthopnea.  Gastrointestinal: Negative for abdominal pain and constipation.  Genitourinary: Negative for urgency and frequency.  Musculoskeletal: Negative for myalgias and back pain.  Neurological: Positive for weakness and headaches.  Psychiatric/Behavioral: The patient does not have insomnia.    Past Medical History  Diagnosis  Date  . Headache    Past Surgical History  Procedure Laterality Date  . Radiology with anesthesia N/A 10/09/2012    Procedure: RADIOLOGY WITH ANESTHESIA;  Surgeon: Lisbeth Renshaw, MD;  Location: [facility] OR;  Service: Radiology;  Laterality: N/A;   History reviewed. No pertinent family history.  Social History: Married. 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 He reports that he has never smoked. He has never used smokeless tobacco. He reports that he does not drink alcohol or use illicit drugs.   Allergies  Allergen Reactions  . Pork-Derived Products     Patient is Muslim and has requested no pork products   Medications Prior to Admission  Medication Sig Dispense Refill  . HYDROcodone-acetaminophen (NORCO/VICODIN) 5-325 MG per tablet Take 1 tablet by mouth every 6 (six) hours as needed for pain.        Home: Home Living Family/patient expects to be discharged to:: Private residence Living Arrangements: Spouse/significant other;Children;Other (Comment) (second wife, 2 young schoolage children in the home) Available Help at Discharge: Family;Available 24 hours/day (unsure - per pt report) Type of Home: Apartment Home Access: Level entry Home Layout: One level Home Equipment: None Additional Comments: info verified with wife and cousin  Lives With: Spouse;Family;Other (Comment) (2 older daughters in shool at [employer])   Functional History: Prior Function Comments: Curator  Functional Status:  Mobility: Bed Mobility Bed Mobility: Supine to Sit;Sitting - Scoot to Edge of Bed Supine to Sit: 5: Supervision Sitting - Scoot to Edge of Bed: 5: Supervision Sit to Supine: 4: Min guard Transfers Transfers: Sit to Stand;Stand to Sit Sit to Stand: 4: Min assist;With upper extremity assist;From  bed;From chair/3-in-1 Stand to Sit: 4: Min assist;With upper extremity assist;To chair/3-in-1 Ambulation/Gait Ambulation/Gait Assistance: 4:  Min assist;3: Mod assist Ambulation Distance (Feet): 420 Feet Assistive device: Rolling walker;1 person hand held assist Ambulation/Gait Assistance Details: patient very unsteady with ambulation, scissoring gait with noted multiple LOB with and without device requiring increased assist to prevent complete fall.  Gait Pattern: Decreased stride length;Step-through pattern Stairs: No Wheelchair Mobility Wheelchair Mobility: No  ADL: ADL Eating/Feeding: Supervision/safety Where Assessed - Eating/Feeding: Chair Grooming: Minimal assistance Where Assessed - Grooming: Supported standing Upper Body Bathing: Minimal assistance Where Assessed - Upper Body Bathing: Supported standing Lower Body Bathing: Moderate assistance Where Assessed - Lower Body Bathing: Supported sit to stand Upper Body Dressing: Minimal assistance Where Assessed - Upper Body Dressing: Supported sit to stand Lower Body Dressing: Minimal assistance Where Assessed - Lower Body Dressing: Supported sit to stand Toilet Transfer: Minimal assistance;Moderate assistance Toilet Transfer Method: Sit to stand;Stand pivot Toilet Transfer Equipment: Comfort height toilet Equipment Used: Gait belt Transfers/Ambulation Related to ADLs: min A for ambulation, but when Loses balance, requires mod A to recover ADL Comments: Pt with poor awareness of deficits  Cognition: Cognition Overall Cognitive Status: Impaired/Different from baseline Orientation Level: Oriented to person;Oriented to time;Oriented to situation;Disoriented to place Cognition Arousal/Alertness: Awake/alert Behavior During Therapy: Flat affect Overall Cognitive Status: Impaired/Different from baseline Area of Impairment: Orientation;Attention;Memory;Safety/judgement;Awareness Orientation Level: Place Current Attention Level: Sustained Memory: Decreased short-term memory Following Commands: Follows one step commands consistently;Follows multi-step commands  inconsistently Safety/Judgement: Decreased awareness of safety;Decreased awareness of deficits Awareness: Intellectual Problem Solving: Slow processing;Requires verbal cues;Requires tactile cues General Comments: Pt confabulating throughout session. Patient stated that he slept well last night and came into the hospital today at 10 am. Patient also stated that he went out to eat for lunch and is not hungry anymore.  Physical Exam: Blood pressure 106/70, pulse 82, temperature 97.9 F (36.6 C), temperature source Oral, resp. rate 18, height 5\' 8"  (1.727 m), weight 92.7 kg (204 lb 5.9 oz), SpO2 98.00%. Nursing note and vitals reviewed.  Constitutional: He appears well-developed and well-nourished.  HENT:  Head: Normocephalic and atraumatic.  Eyes: Conjunctivae are normal. Pupils are equal, round, and reactive to light.  Neck: Normal range of motion. Neck supple.  Cardiovascular: Normal rate and regular rhythm.  No murmur heard.  Pulmonary/Chest: Effort normal and breath sounds normal. No respiratory distress. He has no wheezes.  Abdominal: Soft. Bowel sounds are normal. He exhibits no distension. There is no tenderness.  Musculoskeletal: He exhibits no edema and no tenderness.  Neurological: He is alert.  Oriented to self and place. Told me he was here because of his head. Problems with short term recall. Speech clear and able to follow one and two step commands without difficulty. Strength remains symmetrical but generally 4/5 proximal to distal, lower and upper.   Carries conversation, fluid speech.  Skin: Skin is warm and dry.  Psychiatric: He has a normal mood and affect. His speech is normal and behavior is normal. Cognition and memory are impaired. He expresses inappropriate judgment.     Results for orders placed during the hospital encounter of 10/08/12 (from the past 48 hour(s))  GLUCOSE, CAPILLARY     Status: Abnormal   Collection Time    10/25/12  4:58 PM      Result Value  Range   Glucose-Capillary 150 (*) 70 - 99 mg/dL  GLUCOSE, CAPILLARY     Status: Abnormal   Collection Time    10/25/12  10:24 PM      Result Value Range   Glucose-Capillary 148 (*) 70 - 99 mg/dL  SODIUM     Status: None   Collection Time    10/25/12 11:20 PM      Result Value Range   Sodium 137  135 - 145 mEq/L  GLUCOSE, CAPILLARY     Status: Abnormal   Collection Time    10/26/12  6:26 AM      Result Value Range   Glucose-Capillary 102 (*) 70 - 99 mg/dL  GLUCOSE, CAPILLARY     Status: Abnormal   Collection Time    10/26/12 11:36 AM      Result Value Range   Glucose-Capillary 194 (*) 70 - 99 mg/dL   Comment 1 Documented in Chart    SODIUM     Status: None   Collection Time    10/26/12 11:50 AM      Result Value Range   Sodium 135  135 - 145 mEq/L  GLUCOSE, CAPILLARY     Status: Abnormal   Collection Time    10/26/12  4:37 PM      Result Value Range   Glucose-Capillary 141 (*) 70 - 99 mg/dL   Comment 1 Documented in Chart    SODIUM     Status: None   Collection Time    10/26/12 11:11 PM      Result Value Range   Sodium 138  135 - 145 mEq/L  SODIUM     Status: None   Collection Time    10/27/12 10:25 AM      Result Value Range   Sodium 137  135 - 145 mEq/L  GLUCOSE, CAPILLARY     Status: Abnormal   Collection Time    10/27/12 11:24 AM      Result Value Range   Glucose-Capillary 120 (*) 70 - 99 mg/dL   Comment 1 Documented in Chart     No results found.  Post Admission Physician Evaluation: 1. Functional deficits secondary  to SAH/IVH. 2. Patient is admitted to receive collaborative, interdisciplinary care between the physiatrist, rehab nursing staff, and therapy team. 3. Patient's level of medical complexity and substantial therapy needs in context of that medical necessity cannot be provided at a lesser intensity of care such as a SNF. 4. Patient has experienced substantial functional loss from his/her baseline which was documented above under the "Functional  History" and "Functional Status" headings.  Judging by the patient's diagnosis, physical exam, and functional history, the patient has potential for functional progress which will result in measurable gains while on inpatient rehab.  These gains will be of substantial and practical use upon discharge  in facilitating mobility and self-care at the household level. 5. Physiatrist will provide 24 hour management of medical needs as well as oversight of the therapy plan/treatment and provide guidance as appropriate regarding the interaction of the two. 6. 24 hour rehab nursing will assist with bladder management, bowel management, safety, skin/wound care, disease management, medication administration and patient education  and help integrate therapy concepts, techniques,education, etc. 7. PT will assess and treat for/with: Lower extremity strength, range of motion, stamina, balance, functional mobility, safety, adaptive techniques and equipment, NMR, edcuation.   Goals are: mod I to supervision. 8. OT will assess and treat for/with: ADL's, functional mobility, safety, upper extremity strength, adaptive techniques and equipment, NMR, functional awareness.   Goals are: mod I to supervision. 9. SLP will assess and treat for/with: cognition.  Goals are:  mod I to supervision. 10. Case Management and Social Worker will assess and treat for psychological issues and discharge planning. 11. Team conference will be held weekly to assess progress toward goals and to determine barriers to discharge. 12. Patient will receive at least 3 hours of therapy per day at least 5 days per week. 13. ELOS: one week       14. Prognosis:  excellent   Medical Problem List and Plan: 1. DVT Prophylaxis/Anticoagulation: Mechanical: Sequential compression devices, below knee Bilateral lower extremities 2. Headaches/Pain Management:  Will continue prn medications for headaches.  3. Mood: No signs of distress. Will have LCSW follow for  evaluation.  4. Neuropsych: This patient is not capable of making decisions on his own behalf. 5. Seizure prophylaxis: Keppra and nimotop discontinued today.  6.  ABLA : will check follow up labs in am.    Ranelle Oyster, MD, [facility] Physical Medicine & Rehabilitation   10/27/2012

## 2012-10-28 NOTE — Progress Notes (Signed)
Patient information reviewed and entered into eRehab system by Dorwin Fitzhenry, RN, CRRN, PPS Coordinator.  Information including medical coding and functional independence measure will be reviewed and updated through discharge.    

## 2012-10-28 NOTE — Evaluation (Signed)
Occupational Therapy Assessment and Plan  Patient Details  Name: Richard Davenport MRN: [id number] Date of Birth: 07/03/1960  OT Diagnosis: cognitive deficits  Rehab Potential: Rehab Potential: Excellent ELOS: 5 days   Today's Date: 10/28/2012 Time: 0930-1030 Time Calculation (min): 60 min  Problem List:  Patient Active Problem List   Diagnosis Date Noted  . Acute respiratory failure with hypoxia 10/10/2012  . SAH (subarachnoid hemorrhage) 10/09/2012  . Altered mental status 10/09/2012    Past Medical History:  Past Medical History  Diagnosis Date  . Headache    Past Surgical History:  Past Surgical History  Procedure Laterality Date  . Radiology with anesthesia N/A 10/09/2012    Procedure: RADIOLOGY WITH ANESTHESIA;  Surgeon: Lisbeth Renshaw, MD;  Location: [facility] OR;  Service: Radiology;  Laterality: N/A;    Assessment & Plan Clinical Impression: Patient is a 52 y.o. year old male admitted on 10/08/12 with unresponsiveness due to diffuse SAH in the basal cisterns and bilateral sylvian fissures with some degree of intraventricular hemorrhage. 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 Conchita Paris. Patient self extubated on 10/10/12 and was placed on regular diet as no evidence of dysphagia reported per ST evaluation. He was noted to be febrile on 09/23 and was started on empiric antibiotics. ID consulted due to continued fevers despite negative BC and CSF. LUE duplex positive for superficial vein thrombosis involving the left basilic vein, solely in the antecubital fossa. ID recommended discontinuing antibiotics as fevers felt to be central due to CNS. Hyponatremia treated with florinef as well as normal saline with improvement. Headaches as well as confusion is resolving. Follow up CCT on 10/2 showed continued resolution of SAH, no cortical infarct or hydrocephalus. EVD removed on 10/02  and therapies initiated. He continues with headaches, dizziness, cognitive and balance deficits and CIR recommended for progression.   Patient transferred to CIR on 10/27/2012 .    Patient currently requires minimal assistance with basic self-care skills secondary to decreased coordination and decreased memory and delayed processing.  Prior to hospitalization, patient could complete BADL/iADL with independent .  Patient will benefit from skilled intervention to increase independence with basic self-care skills and increase level of independence with iADL prior to discharge home with care partner.  Anticipate patient will require intermittent supervision and no further OT follow recommended.  OT - End of Session Activity Tolerance: Tolerates 30+ min activity without fatigue Endurance Deficit: Yes OT Assessment Rehab Potential: Excellent OT Patient demonstrates impairments in the following area(s): Balance;Cognition;Safety OT Basic ADL's Functional Problem(s): Other (comment) (Functional complex iADL) OT Additional Impairment(s): Other (comment) (short term memory ) OT Plan OT Intensity: Minimum of 1-2 x/day, 45 to 90 minutes OT Frequency: 5 out of 7 days OT Duration/Estimated Length of Stay: 5 days OT Treatment/Interventions: Balance/vestibular training;Cognitive remediation/compensation;Discharge planning;Therapeutic Activities;Patient/family education;Functional mobility training OT Self Feeding Anticipated Outcome(s): Independent OT Basic Self-Care Anticipated Outcome(s): Mod I - Supervision OT Toileting Anticipated Outcome(s): Independent OT Bathroom Transfers Anticipated Outcome(s): Independent OT Recommendation Patient destination: Home Follow Up Recommendations: None Equipment Recommended: None recommended by OT   Skilled Therapeutic Intervention 1:1 Initial OT evaluation completed with intervention provided emphasizing safety awareness, dynamic standing balance, functional  mobility, endurance and cognitive rehab.   Patient acknowledges impaired short-term memory and deficits with orientation to time and place during initial session.   Patient required standby assistance for ADL with supervision for safety due to inadequate awareness of fall  risk which required review of functional competence with environmental controls and utilities in his room including shower, toilet, bed and call light.   OT Evaluation Precautions/Restrictions  Precautions Precautions: Fall Restrictions Weight Bearing Restrictions: No  General Chart Reviewed: Yes Family/Caregiver Present: No  Pain Pain Assessment Pain Assessment: No/denies pain  Home Living/Prior Functioning Home Living Family/patient expects to be discharged to:: Private residence Living Arrangements: Spouse/significant other;Children Available Help at Discharge: Family;Available 24 hours/day Type of Home: Apartment Home Access: Level entry Home Layout: One level  Lives With: Spouse;Family;Other (Comment) Prior Function Level of Independence: Independent with gait;Independent with homemaking with ambulation;Independent with basic ADLs;Independent with transfers  Able to Take Stairs?: Yes Driving: Yes Vocation: Full time employment Vocation Requirements: Sales promotion account executive - History Baseline Vision: Wears glasses only for reading Patient Visual Report: No change from baseline Vision - Assessment Eye Alignment: Within Functional Limits Perception Perception: Within Functional Limits Praxis Praxis: Intact   Cognition Overall Cognitive Status: Impaired/Different from baseline Arousal/Alertness: Awake/alert Orientation Level: Oriented to person;Oriented to situation;Disoriented to time Selective Attention: Appears intact Alternating Attention: Impaired Alternating Attention Impairment: Functional complex;Verbal complex Memory: Impaired Memory Impairment: Decreased short term  memory;Decreased recall of new information Decreased Short Term Memory: Functional complex;Verbal complex Awareness: Impaired Awareness Impairment: Anticipatory impairment Problem Solving: Impaired Problem Solving Impairment: Verbal complex;Functional complex Executive Function: Self Correcting;Reasoning Reasoning: Impaired Reasoning Impairment: Verbal complex;Functional complex Self Monitoring: Impaired Self Monitoring Impairment: Verbal complex;Functional complex Self Correcting: Impaired Self Correcting Impairment: Verbal basic;Functional basic Safety/Judgment: Impaired  Sensation Sensation Light Touch: Appears Intact Stereognosis: Appears Intact Hot/Cold: Appears Intact Proprioception: Appears Intact Coordination Gross Motor Movements are Fluid and Coordinated: Yes Fine Motor Movements are Fluid and Coordinated: Yes  Motor  Motor Motor: Within Functional Limits  Mobility  Bed Mobility Bed Mobility: Supine to Sit;Sit to Supine Supine to Sit: 5: Supervision Supine to Sit Details: Verbal cues for precautions/safety Sitting - Scoot to Edge of Bed: 7: Independent Sit to Supine: 5: Supervision Sit to Supine - Details: Verbal cues for precautions/safety Transfers Transfers: Sit to Stand;Stand to Sit Sit to Stand: 5: Supervision Sit to Stand Details: Verbal cues for precautions/safety Stand to Sit: 5: Supervision Stand to Sit Details (indicate cue type and reason): Verbal cues for precautions/safety   Trunk/Postural Assessment  Cervical Assessment Cervical Assessment: Within Functional Limits Thoracic Assessment Thoracic Assessment: Within Functional Limits Lumbar Assessment Lumbar Assessment: Within Functional Limits Postural Control Postural Control: Within Functional Limits Righting Reactions: With higher level balance reactions, pt demonstrates consistent LOB in posterior direction. Pt able to verbalize direction of LOB accurately. Ankle strategy present, but  decreased speed of correction   Balance Balance Balance Assessed: Yes Standardized Balance Assessment Standardized Balance Assessment: Berg Balance Test Berg Balance Test Sit to Stand: Able to stand without using hands and stabilize independently Standing Unsupported: Able to stand 2 minutes with supervision Sitting with Back Unsupported but Feet Supported on Floor or Stool: Able to sit safely and securely 2 minutes Stand to Sit: Sits safely with minimal use of hands Transfers: Able to transfer safely, minor use of hands Standing Unsupported with Eyes Closed: Able to stand 3 seconds Standing Ubsupported with Feet Together: Able to place feet together independently but unable to hold for 30 seconds From Standing, Reach Forward with Outstretched Arm: Can reach forward >12 cm safely (5") From Standing Position, Pick up Object from Floor: Able to pick up shoe, needs supervision From Standing Position, Turn to Look Behind Over each Shoulder: Turn sideways only  but maintains balance Turn 360 Degrees: Needs close supervision or verbal cueing Standing Unsupported, Alternately Place Feet on Step/Stool: Able to complete 4 steps without aid or supervision Standing Unsupported, One Foot in Front: Able to plae foot ahead of the other independently and hold 30 seconds Standing on One Leg: Tries to lift leg/unable to hold 3 seconds but remains standing independently Total Score: 38 Static Sitting Balance Static Sitting - Balance Support: No upper extremity supported;Feet supported Static Sitting - Level of Assistance: 5: Stand by assistance Dynamic Sitting Balance Dynamic Sitting - Balance Support: Left upper extremity supported;Right upper extremity supported;No upper extremity supported;Feet supported Dynamic Sitting - Level of Assistance: 5: Stand by assistance Dynamic Sitting - Balance Activities: Lateral lean/weight shifting;Forward lean/weight shifting;Reaching for objects Static Standing  Balance Static Standing - Balance Support: No upper extremity supported Static Standing - Level of Assistance: 5: Stand by assistance;4: Min assist Static Standing - Comment/# of Minutes: Pt req min guard to min A for static balance w/ EC or narrow BOS due to posterior LOB, however, min guard to close (S) when EO and feet in natural BOS Static Stance: Eyes closed Dynamic Standing Balance Dynamic Standing - Balance Support: No upper extremity supported Dynamic Standing - Level of Assistance: 4: Min assist Dynamic Standing - Balance Activities: Forward lean/weight shifting;Lateral lean/weight shifting;Reaching across midline;Reaching for weighted objects;Reaching for objects Dynamic Standing - Comments: Pt demonstrates minor episodes of LOB, mostly posteriorly during higher level balance challenges, min A req for correction 25% of time  Extremity/Trunk Assessment RUE Assessment RUE Assessment: Within Functional Limits LUE Assessment LUE Assessment: Within Functional Limits  FIM:  FIM - Eating Eating Activity: 7: Complete independence:no helper FIM - Grooming Grooming: 0: Activity did not occur FIM - Bathing Bathing Steps Patient Completed: Chest;Right Arm;Left Arm;Abdomen;Front perineal area;Buttocks;Right upper leg;Left upper leg;Right lower leg (including foot);Left lower leg (including foot) Bathing: 5: Supervision: Safety issues/verbal cues FIM - Upper Body Dressing/Undressing Upper body dressing/undressing steps patient completed: Thread/unthread right sleeve of pullover shirt/dresss;Thread/unthread left sleeve of pullover shirt/dress;Put head through opening of pull over shirt/dress;Pull shirt over trunk Upper body dressing/undressing: 5: Supervision: Safety issues/verbal cues FIM - Lower Body Dressing/Undressing Lower body dressing/undressing steps patient completed: Thread/unthread right pants leg;Thread/unthread left pants leg;Pull pants up/down;Fasten/unfasten pants;Don/Doff  right sock;Don/Doff left sock Lower body dressing/undressing: 5: Supervision: Safety issues/verbal cues FIM - Toileting Toileting steps completed by patient: Adjust clothing prior to toileting;Performs perineal hygiene;Adjust clothing after toileting Toileting: 7: Independent: No helper, no device FIM - Bed/Chair Transfer Bed/Chair Transfer: 5: Supine > Sit: Supervision (verbal cues/safety issues) FIM - Diplomatic Services operational officer Devices: Grab bars Toilet Transfers: 5-To toilet/BSC: Supervision (verbal cues/safety issues);5-From toilet/BSC: Supervision (verbal cues/safety issues) FIM - Secretary/administrator Devices: Walk in shower;Grab bars Tub/shower Transfers: 5-Into Tub/Shower: Supervision (verbal cues/safety issues);5-Out of Tub/Shower: Supervision (verbal cues/safety issues)   Refer to Care Plan for Long Term Goals  Recommendations for other services: None  Discharge Criteria: Patient will be discharged from OT if patient refuses treatment 3 consecutive times without medical reason, if treatment goals not met, if there is a change in medical status, if patient makes no progress towards goals or if patient is discharged from hospital.  The above assessment, treatment plan, treatment alternatives and goals were discussed and mutually agreed upon: by patient  Second session: Time: 1610-9604 Time Calculation (min):  24 min  Pain Assessment: No report of pain  Skilled Therapeutic Interventions: Therapeutic activities with emphasis on discharge planning, improved safety  awareness, and re-education on goals and objectives of planned treatment activities.   See FIM for current functional status  Therapy/Group: Individual Therapy  Meghna Hagmann 10/28/2012, 6:53 PM

## 2012-10-29 ENCOUNTER — Inpatient Hospital Stay (HOSPITAL_COMMUNITY): Payer: Medicaid Other | Admitting: Speech Pathology

## 2012-10-29 ENCOUNTER — Inpatient Hospital Stay (HOSPITAL_COMMUNITY): Payer: Medicaid Other

## 2012-10-29 ENCOUNTER — Encounter (HOSPITAL_COMMUNITY): Payer: Self-pay

## 2012-10-29 LAB — GLUCOSE, CAPILLARY

## 2012-10-29 LAB — URINE CULTURE
Colony Count: NO GROWTH
Culture: NO GROWTH

## 2012-10-29 MED ORDER — TOPIRAMATE 25 MG PO TABS
25.0000 mg | ORAL_TABLET | Freq: Every day | ORAL | Status: DC
  Administered 2012-10-29 – 2012-11-04 (×7): 25 mg via ORAL
  Filled 2012-10-29 (×8): qty 1

## 2012-10-29 NOTE — IPOC Note (Signed)
Overall Plan of Care [facility] Patient Details Name: Damyn Weitzel MRN: [id number] DOB: 04-17-1960  Admitting Diagnosis: [facility]  Hospital Problems: Active Problems:   SAH (subarachnoid hemorrhage)     Functional Problem List: Nursing Safety;Skin Integrity;Nutrition;Medication Management;Endurance;Motor  PT Balance;Endurance;Motor;Safety  OT Balance;Cognition;Safety  SLP Cognition  TR         Basic ADL's: OT Other (comment) (Functional complex iADL)     Advanced  ADL's: OT       Transfers: PT Bed Mobility;Bed to Chair;Car;Furniture;Floor  OT       Locomotion: PT Ambulation;Stairs     Additional Impairments: OT Other (comment) (short term memory )  SLP Social Cognition;Communication comprehension Problem Solving;Memory;Awareness;Attention  TR      Anticipated Outcomes Item Anticipated Outcome  Self Feeding Independent  Swallowing      Basic self-care  Mod I - Supervision  Toileting  Independent   Bathroom Transfers Independent  Bowel/Bladder  pt. remains continent of bladder and bowel  Transfers  Mod(I) to (S)  Locomotion  Mod(I) to (S), ambulatory level  Communication  Mod I   Cognition  Supervision   Pain  pt. pain will remain 2 in scale of 1-10  Safety/Judgment  pt. and family  will be able to ask and call for assisstance and follow the safety plan   Therapy Plan: PT Intensity: Minimum of 1-2 x/day ,45 to 90 minutes PT Frequency: 5 out of 7 days PT Duration Estimated Length of Stay: 5-7days OT Intensity: Minimum of 1-2 x/day, 45 to 90 minutes OT Frequency: 5 out of 7 days OT Duration/Estimated Length of Stay: 5 days SLP Intensity: Minumum of 1-2 x/day, 30 to 90 minutes SLP Frequency: 5 out of 7 days SLP Duration/Estimated Length of Stay: 1 week       Team Interventions: Nursing Interventions Patient/Family Education;Disease Management/Prevention;Pain Management;Medication Management  PT interventions Ambulation/gait training;Balance/vestibular  training;Cognitive remediation/compensation;Functional mobility training;Patient/family education;Therapeutic Exercise;Therapeutic Activities;UE/LE Coordination activities;UE/LE Strength taining/ROM;Stair training;Neuromuscular re-education;DME/adaptive equipment instruction  OT Interventions Balance/vestibular training;Cognitive remediation/compensation;Discharge planning;Therapeutic Activities;Patient/family education;Functional mobility training  SLP Interventions Cognitive remediation/compensation;Cueing hierarchy;Functional tasks;Environmental controls;Internal/external aids;Patient/family education;Therapeutic Activities;Speech/Language facilitation  TR Interventions    SW/CM Interventions Discharge Planning;Psychosocial Support;Patient/Family Education    Team Discharge Planning: Destination: PT-  ,OT- Home , SLP-Home Projected Follow-up: PT-Other (comment) (intermittent supervision/assist), OT-  None, SLP-Outpatient SLP (amount of supervision is TBD) Projected Equipment Needs: PT-None recommended by PT, OT- None recommended by OT, SLP-None recommended by SLP Patient/family involved in discharge planning: PT- Patient,  OT-Patient, SLP-Patient  MD ELOS: one week Medical Rehab Prognosis:  Excellent Assessment: The patient has been admitted for CIR therapies. The team will be addressing, functional mobility, strength, stamina, balance, safety, adaptive techniques/equipment, self-care, bowel and bladder mgt, patient and caregiver education, NMR, visual spatial awareness, cognition, pain. Goals have been set at mod I to occasional supervision.    Ranelle Oyster, MD, FAAPMR      See Team Conference Notes for weekly updates to the plan of care

## 2012-10-29 NOTE — Progress Notes (Signed)
Social Work  Social Work Assessment and Plan  Patient Details  Name: Richard Davenport MRN: [id number] Date of Birth: January 12, 1961  Today's Date: 10/29/2012  Problem List:  Patient Active Problem List   Diagnosis Date Noted  . Acute respiratory failure with hypoxia 10/10/2012  . SAH (subarachnoid hemorrhage) 10/09/2012  . Altered mental status 10/09/2012   Past Medical History:  Past Medical History  Diagnosis Date  . Headache    Past Surgical History:  Past Surgical History  Procedure Laterality Date  . Radiology with anesthesia N/A 10/09/2012    Procedure: RADIOLOGY WITH ANESTHESIA;  Surgeon: Lisbeth Renshaw, MD;  Location: [facility] OR;  Service: Radiology;  Laterality: N/A;   Social History:  reports that he has never smoked. He has never used smokeless tobacco. He reports that he does not drink alcohol or use illicit drugs.  Family / Support Systems Marital Status: Married Patient Roles: Spouse;Parent;Other (Comment) Equities trader) Spouse/Significant Other: wife, Richard Davenport @ (C) [phone]  Children: pt has two children living with his first wife in [place] ages 83 and 77;  two children ages 29 and 70 at home with him and his 2nd wife. Other Supports: wife's nephew, Richard Davenport @ ([phone] is local and willing to assist as well. Anticipated Caregiver: wife works days; Richard Davenport states family can help Ability/Limitations of Caregiver: wife works days;  nephew also working Medical laboratory scientific officer: 24/7 Family Dynamics: pt describes good relationship with his family and believes he can rely on wife's family to assist as able  Social History Preferred language: English Religion: Muslim Cultural Background: [name] and moved to [place] in 1990 Education: completed college in [place] with Nurse, children's degree Read: Yes Write: Yes Employment Status: Employed Name of Employer: Building services engineer - [employer] of Employment: 2 (months) Return to Work Plans: Pt hopeful he can  return to work Fish farm manager Issues: none Guardian/Conservator: none - pt capable of making his own decisions   Abuse/Neglect Physical Abuse: Denies Verbal Abuse: Denies Sexual Abuse: Denies Exploitation of patient/patient's resources: Denies Self-Neglect: Denies  Emotional Status [name] affect, behavior adn adjustment status: Very pleasant gentleman lying in bed and agreeable to complete interview despite reported fatigue.  He is able to provide information easily, however, with occasionally he has to self correct (i.e. interchanging ages of children).  Denies any s/s of depression or anxiety and without any hx of these problems.  Will monitor Recent Psychosocial Issues: new job Pyschiatric History: none Substance Abuse History: none  Patient / Family Perceptions, Expectations & Goals Pt/Family understanding of illness & functional limitations: pt able to report that he had a hemorrhage due to a ruptured aneurysm Premorbid pt/family roles/activities: completely independent and working full time with young family Anticipated changes in roles/activities/participation: may need some caregiver assistance dependent on gains made Pt/family expectations/goals: "I want to get back to independent"  [employer]: None Premorbid Home Care/DME Agencies: None Transportation available at discharge: yes Resource referrals recommended: Neuropsychology;Support group (specify)  Discharge Planning Living Arrangements: Spouse/significant other;Children;Other (Comment) (second wife, 2 young schoolage children in the home) Support Systems: Spouse/significant other;Children Type of Residence: Private residence Insurance Resources: Customer service manager Resources: Employment Financial Screen Referred: Previously completed Living Expenses: Psychologist, sport and exercise Management: Patient Does the patient have any problems obtaining your medications?: Yes (Describe) (MA app  pending) Home Management: wife and family Patient/Family Preliminary Plans: pt plans to return home with wife as primary support Barriers to Discharge: Family Support (24/ 7 care may be  difficult to secure if needed) Social Work Anticipated Follow Up Needs: HH/OP;Support Group Expected length of stay: ELOS 1 to 2 weeks; likely 1 week  Clinical Impression Pleasant gentleman here following aneurysm rupture.  Making good gains.  Young family with spouse working f/t.  May require 24/7 supervision - goals TBA. Denies any significant emotional distress.  Request neuropsych testing to screen higher level cognition.  Will follow for support and d/c planning.  Richard Davenport 10/29/2012, 10:33 AM

## 2012-10-29 NOTE — Progress Notes (Signed)
Occupational Therapy Session Note  Patient Details  Name: Richard Davenport MRN: [id number] Date of Birth: 02/26/1960  Today's Date: 10/29/2012 Time: 0830-0930 Time Calculation (min): 60 min  Short Term Goals: Week 1:  OT Short Term Goal 1 (Week 1): LTG=STG due to estimated brief LOS  Skilled Therapeutic Interventions/Progress Updates:   ADL-retraining with emphasis on dynamic sitting and standing balance, improved attention, improved awareness, and short-term memory retraining.   Patient able to correctly report orientation to time and place this session but required cue to read board to state name of OT provider. Patient progressed through bathing and dressing but denied need for grooming despite appearance of heavy stubble on his face.   At end of session pt's cousins arrived and visited while patient consumed his breakfast meal, crossed legged, sitting near the head of his bed and reaching for plates and utensils, assisted by his eldest cousin.  Therapy Documentation Precautions:  Precautions Precautions: Fall Restrictions Weight Bearing Restrictions: No  Vital Signs: Therapy Vitals BP: 128/90 mmHg  Pain: Pain Assessment Pain Assessment: No/denies pain  See FIM for current functional status  Therapy/Group: Individual Therapy  Second session: Time: 1430-1505 Time Calculation (min):  35 min  Pain Assessment: No/denies pain  Skilled Therapeutic Interventions: Therapeutic activity with emphasis on cognitive re-ed, improved awareness, sustained attention, and short-term memory.  Patient was educated on components of game play using checker and large checkboard.  Written instructions were provided with graphics to demonstrate correct position of checkers on board and to guide on basic game play strategy and movements.   Patient was tasked to duplicate pattern of black game pieces after demo of setup of red game pieces on opposite side.  Despite specific directions to start from bottom  row, filling only black squares left to right, patient required assist to initiate alignment on proper square and he was unable to match pattern provided on written/graphic instruction page.    During brief initial game play, patient continued to require cues to only advance game pieces diagonally forward.   Patient required repeat of instruction 7 times during game play and when invited to ask questions if needed, he did not take advantage of that option.  Patient did sustain attention to game play for 15 minutes and was able to read instruction with min assist due to unfamiliar font type and unique vocabulary of game.   Patient repeated that he had never played checker before.   Plan to continue game play to observe for carry-over of instruction and strategies.   See FIM for current functional status  Therapy/Group: Individual Therapy  Ludwig Tugwell 10/29/2012, 12:58 PM

## 2012-10-29 NOTE — Progress Notes (Addendum)
Physical Therapy Session Note  Patient Details  Name: Mariah Harn MRN: [id number] Date of Birth: 10/22/1960  Today's Date: 10/29/2012 Time: 4098-1191 Time Calculation (min): 55 min  Short Term Goals: Week 1:  PT Short Term Goal 1 (Week 1): STGs=LTGs  Skilled Therapeutic Interventions/Progress Updates:  1:1. Pt received supine in bed sleeping, easy to wake and agreeable to therapy w/ min encouragement due to fatigue. Focus this tx session on pathfinding, problem solving and higher level dynamic balance. At start of tx session pt asked to come up w/ a list of places often found in a hospital. Pt able to come up w/ two places (coffee shop and cafeteria, but then perseverating on naming things (bridges, gates) not places despite redirection. Pt demonstrated very poor ability to problem solve and locate places on list using signs in hallway despite max verbal and tactile cues 2/2 decreased sustained and selective attention. Pt also demonstrating very poor short term memory/processing, quickly forgetting target floor/location after walking away from sign. Throughout tx session pt only able to consistently recall 2/3 (floor and room number) variables needed to find room, consistently forgetting zone despite repeated cues throughout tx session. [name] dynamic balance challenged through forward amb- tossing ball to self, bouncing ball, alternating; sidestepping-bouncing ball; retro ambulation. Pt req consistent verbal cues to maintain bounce/toss during amb.  Pt independent for bed mob, but min guard to close supervision during amb >500' and balance challenges this tx session. Pt demonstrated 4 episodes of minor LOB 2/2 environmental distractions w/ ability to self-correct each time. Pt supine in bed at end of session w/ all needs in reach and bed alarm on.   Therapy Documentation Precautions:  Precautions Precautions: Fall Restrictions Weight Bearing Restrictions: No Pain: Pain Assessment Pain Assessment:  No/denies pain  See FIM for current functional status  Therapy/Group: Individual Therapy  Denzil Hughes 10/29/2012, 1:58 PM

## 2012-10-29 NOTE — Progress Notes (Signed)
[facility] Individual Statement of Services  Patient Name:  Richard Davenport  Date:  10/29/2012  [facility].  Our goal is to provide you with an individualized program based on your diagnosis and situation, designed to meet your specific needs.  With this comprehensive rehabilitation program, you will be expected to participate in at least 3 hours of rehabilitation therapies Monday-Friday, with modified therapy programming on the weekends.  Your rehabilitation program will include the following services:  Physical Therapy (PT), Occupational Therapy (OT), Speech Therapy (ST), 24 hour per day rehabilitation nursing, Therapeutic Recreaction (TR), Neuropsychology, Case Management (Social Worker), Rehabilitation Medicine, Nutrition Services and Pharmacy Services  Weekly team conferences will be held on Tuesdays to discuss your progress.  Your Social Worker will talk with you frequently to get your input and to update you on team discussions.  Team conferences with you and your family in attendance may also be held.  Expected length of stay: 1 week  Overall anticipated outcome: modified independent  Depending on your progress and recovery, your program may change. Your Social Worker will coordinate services and will keep you informed of any changes. Your Social Worker's name and contact numbers are listed  below.  The following services may also be recommended but are not provided by the [facility]:   Driving Evaluations  Home Health Rehabiltiation Services  Outpatient Rehabilitation Services  Vocational Rehabilitation   Arrangements will be made to provide these services after discharge if needed.  Arrangements include referral to agencies that provide these services.  Your insurance has been verified to be:  None (Medicaid application pending) Your primary doctor is:  None  Pertinent information will be shared with your  doctor and your insurance company.  Social Worker:  [place], [place] [phone] or ([phone]   Information discussed with and copy given to patient by: Amada Jupiter, 10/29/2012, 10:35 AM

## 2012-10-29 NOTE — Progress Notes (Signed)
Speech Language Pathology Daily Session Note  Patient Details  Name: Richard Davenport MRN: [id number] Date of Birth: 12/05/1960  Today's Date: 10/29/2012 Time: 4098-1191 Time Calculation (min): 40 min  Short Term Goals: Week 1: SLP Short Term Goal 1 (Week 1): Pt will utilize call bell to express wants/needs with Mod I.  SLP Short Term Goal 2 (Week 1): Pt will demonstrate functional problem solving for complex tasks with supervision verbal and question cues.  SLP Short Term Goal 3 (Week 1): Pt will utilize external memory aids to recall new, daily information with supervision verbal and visual cues.  SLP Short Term Goal 4 (Week 1): Pt will demonstrate alternating attention between two tasks for 30 minutes with supervision verbal cues for redirection.  SLP Short Term Goal 5 (Week 1): Pt will follow multistep commands with 100% accuracy and Min verbal and question cues.   Skilled Therapeutic Interventions: Treatment focus on cognitive goals. SLP facilitated session by providing Mod A verbal, visual and question cues for functional problem solving and organization with mildly complex task of balancing a checkbook. Pt also asked clinician to clarify "rules" of task multiple times throughout the session. Despite difficulty with task, pt continues to demonstrate decreased emergent awareness into difficulty of task.    FIM:  Comprehension Comprehension Mode: Auditory Comprehension: 5-Understands basic 90% of the time/requires cueing < 10% of the time Expression Expression Mode: Verbal Expression: 5-Expresses complex 90% of the time/cues < 10% of the time Social Interaction Social Interaction: 5-Interacts appropriately 90% of the time - Needs monitoring or encouragement for participation or interaction. Problem Solving Problem Solving: 3-Solves basic 50 - 74% of the time/requires cueing 25 - 49% of the time Memory Memory: 4-Recognizes or recalls 75 - 89% of the time/requires cueing 10 - 24% of the  time FIM - Eating Eating Activity: 7: Complete independence:no helper  Pain Pain Assessment Pain Assessment: No/denies pain  Therapy/Group: Individual Therapy  [name] 10/29/2012, 12:05 PM

## 2012-10-29 NOTE — Progress Notes (Signed)
Subjective/Complaints: Still having headaches. Otherwise doing well. .  A 12 point review of systems has been performed and if not noted above is otherwise negative.   Objective: Vital Signs: Blood pressure 119/82, pulse 92, temperature 98.3 F (36.8 C), temperature source Oral, resp. rate 19, SpO2 100.00%. No results found.  Recent Labs  10/28/12 0705  WBC 6.8  HGB 12.6*  HCT 35.9*  PLT 194    Recent Labs  10/27/12 1025 10/28/12 0705  NA 137 136  K  --  3.9  CL  --  101  GLUCOSE  --  90  BUN  --  13  CREATININE  --  0.97  CALCIUM  --  9.0   CBG (last 3)   Recent Labs  10/28/12 1647 10/28/12 2019 10/29/12 0717  GLUCAP 119* 147* 113*    Wt Readings from Last 3 Encounters:  10/18/12 92.7 kg (204 lb 5.9 oz)  10/18/12 92.7 kg (204 lb 5.9 oz)    Physical Exam:  Nursing note and vitals reviewed.  Constitutional: He appears well-developed and well-nourished.  HENT:  Head: Normocephalic and atraumatic.  Eyes: Conjunctivae are normal. Pupils are equal, round, and reactive to light.  Neck: Normal range of motion. Neck supple.  Cardiovascular: Normal rate and regular rhythm.  No murmur heard.  Pulmonary/Chest: Effort normal and breath sounds normal. No respiratory distress. He has no wheezes.  Abdominal: Soft. Bowel sounds are normal. He exhibits no distension. There is no tenderness.  Musculoskeletal: He exhibits no edema and no tenderness.  Neurological: He is alert.  Oriented to self and place with cues.  Problems with short term recall. Speech clear and able to follow one and two step commands without difficulty. Strength remains symmetrical but generally 4/5 proximal to distal, lower and upper. Carries conversation, fluid speech.  Skin: Skin is warm and dry.  Psychiatric: He has a normal mood and affect. His speech is normal and behavior is normal. Cognition and memory are impaired. He expresses inappropriate judgment.    Assessment/Plan: 1. 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 Physiatrist is providing close team supervision and 24 hour management of active medical problems listed below. Physiatrist and rehab team continue to assess barriers to discharge/monitor patient progress toward functional and medical goals. FIM: FIM - Bathing Bathing Steps Patient Completed: Chest;Right Arm;Left Arm;Abdomen;Front perineal area;Buttocks;Right upper leg;Left upper leg;Right lower leg (including foot);Left lower leg (including foot) Bathing: 5: Supervision: Safety issues/verbal cues  FIM - Upper Body Dressing/Undressing Upper body dressing/undressing steps patient completed: Thread/unthread right sleeve of pullover shirt/dresss;Thread/unthread left sleeve of pullover shirt/dress;Put head through opening of pull over shirt/dress;Pull shirt over trunk Upper body dressing/undressing: 5: Supervision: Safety issues/verbal cues FIM - Lower Body Dressing/Undressing Lower body dressing/undressing steps patient completed: Thread/unthread right pants leg;Thread/unthread left pants leg;Pull pants up/down;Fasten/unfasten pants;Don/Doff right sock;Don/Doff left sock Lower body dressing/undressing: 5: Supervision: Safety issues/verbal cues  FIM - Toileting Toileting steps completed by patient: Adjust clothing prior to toileting;Performs perineal hygiene;Adjust clothing after toileting Toileting: 7: Independent: No helper, no device  FIM - Diplomatic Services operational officer Devices: Grab bars Toilet Transfers: 5-To toilet/BSC: Supervision (verbal cues/safety issues);5-From toilet/BSC: Supervision (verbal cues/safety issues)  FIM - Bed/Chair Transfer Bed/Chair Transfer: 5: Supine > Sit: Supervision (verbal cues/safety issues)  FIM - Locomotion: Wheelchair Distance: 150' Locomotion: Wheelchair: 5: Travels 150 ft or more: maneuvers on rugs and over  door sills with supervision, cueing or coaxing  FIM - Locomotion: Ambulation Locomotion: Ambulation Assistive Devices: Walker - Rolling;Other (comment) (HHA) Ambulation/Gait Assistance: 4: Min assist Locomotion: Ambulation: 4: Travels 150 ft or more with minimal assistance (Pt.>75%)  Comprehension Comprehension Mode: Auditory Comprehension: 4-Understands basic 75 - 89% of the time/requires cueing 10 - 24% of the time  Expression Expression Mode: Verbal Expression: 5-Expresses basic needs/ideas: With no assist  Social Interaction Social Interaction: 5-Interacts appropriately 90% of the time - Needs monitoring or encouragement for participation or interaction.  Problem Solving Problem Solving: 4-Solves basic 75 - 89% of the time/requires cueing 10 - 24% of the time  Memory Memory: 4-Recognizes or recalls 75 - 89% of the time/requires cueing 10 - 24% of the time  Medical Problem List and Plan:  1. DVT Prophylaxis/Anticoagulation: Mechanical: Sequential compression devices, below knee Bilateral lower extremities  2. Headaches/Pain Management: Will continue prn medications for headaches.   -add qhs topamax  3. Mood: No signs of distress. Will have LCSW follow for evaluation.  4. Neuropsych: This patient is not capable of making decisions on his own behalf.  5. Seizure prophylaxis: Keppra and nimotop completed 6. ABLA : hgb 12.6   LOS (Days) 2 A FACE TO FACE EVALUATION WAS PERFORMED  SWARTZ,ZACHARY T 10/29/2012 8:10 AM

## 2012-10-30 ENCOUNTER — Inpatient Hospital Stay (HOSPITAL_COMMUNITY): Payer: Self-pay

## 2012-10-30 ENCOUNTER — Inpatient Hospital Stay (HOSPITAL_COMMUNITY): Payer: Medicaid Other | Admitting: Speech Pathology

## 2012-10-30 ENCOUNTER — Inpatient Hospital Stay (HOSPITAL_COMMUNITY): Payer: Medicaid Other | Admitting: *Deleted

## 2012-10-30 ENCOUNTER — Inpatient Hospital Stay (HOSPITAL_COMMUNITY): Payer: Medicaid Other

## 2012-10-30 DIAGNOSIS — I10 Essential (primary) hypertension: Secondary | ICD-10-CM

## 2012-10-30 DIAGNOSIS — I609 Nontraumatic subarachnoid hemorrhage, unspecified: Secondary | ICD-10-CM

## 2012-10-30 LAB — GLUCOSE, CAPILLARY
Glucose-Capillary: 108 mg/dL — ABNORMAL HIGH (ref 70–99)
Glucose-Capillary: 120 mg/dL — ABNORMAL HIGH (ref 70–99)
Glucose-Capillary: 133 mg/dL — ABNORMAL HIGH (ref 70–99)

## 2012-10-30 NOTE — Progress Notes (Signed)
Note reviewed and this clinician agrees with information.

## 2012-10-30 NOTE — Progress Notes (Signed)
Physical Therapy Session Note  Patient Details  Name: Richard Davenport MRN: [id number] Date of Birth: 12-25-1960  Today's Date: 10/30/2012 Time: 1300-1355 Time Calculation (min): 55 min  Short Term Goals: Week 1:  PT Short Term Goal 1 (Week 1): STGs=LTGs  Skilled Therapeutic Interventions/Progress Updates:  1:1. Pt received supine in bed, awake and ready for therapy. Pt denied having any pain. Focus this tx session on novel processing as well as sustained and selective attention. Pt demonstrated good ability to maintain sustained and selective attention to card game w/ therapist in busy therapy gym. Pt demonstrated increased difficulty processing rules of pictorial card game, req repetition of basic directions. Pt often attributing lack of ability to understand game due to absent reading glasses, but pt verbalized ability to see images clearly on cards. Once pt able to understand game at basic level, able to progress game to include more options w/ ability to complete task in appropriate amount of time. Focus at end of session on pathfinding to cafeteria, gift shop and back to room w/ use of signs. Pt demonstrated decreased need for verbal and tactile cues to attend to entirety of signs w/ only slight improvement of recall after reading sign as well as interpreting sign. Pt again blaming lack of ability to interpret information on sign to absent reading glasses despite pt's ability to accurately read sign. Overall pt req mod-max A w/ pathfinding task. Pt able to amb >500' throughout tx session on unit and off unit in busy community environments, supervision overall w/ 1 minor LOB that pt able to self-correct. Pt sitting on bed at end of session w/ all needs in reach and bed alarm on.   Therapy Documentation Precautions:  Precautions Precautions: Fall Restrictions Weight Bearing Restrictions: No  See FIM for current functional status  Therapy/Group: Individual Therapy  Denzil Hughes 10/30/2012, 2:03 PM

## 2012-10-30 NOTE — Progress Notes (Signed)
Patient's blood glucose at HS was 302- patient asymptomatic. Notified Dan A.,PA no new orders given at this time. Will continue to monitor patient.

## 2012-10-30 NOTE — Progress Notes (Signed)
Occupational Therapy Session Note  Patient Details  Name: Gerald Kuehl MRN: [id number] Date of Birth: [birth year]  Today's Date: 10/30/2012 Time: 0930-1030 Time Calculation (min): 60 min  Short Term Goals: Week 1:  OT Short Term Goal 1 (Week 1): LTG=STG due to estimated brief LOS  Skilled Therapeutic Interventions/Progress Updates: ADL-retraining with emphasis on memory, problem-solving, sequencing, orientation and anticipatory awareness.   With minimal verbal cues to initiate and supervision to operate shower controls, patient completed bathing standing in walk-in shower with distant supervision.   Patient completed dressing standing in bathroom except for socks which he donned sitting at edge of bed.  Patient required reorientation to situation during this session after stating that he had left the facility earlier and had gone to the airport to drop off a friend for a flight.   Patient maintained this assertion until review of facts to include visiting the nurses station and reviewing safety procedures and policies that would prevent his unplanned absence from intensive medical treatment.  After continued re-education and review, patient eventually realized that he may have merely had a very vivid dream of events prior to treatment session.    Patient also stated that he may be leaving over the weekend because he felt he was able to bathe, dress, and walk without a problem.  OT again reviewed patient's presentation of cognitive deficits which patient acknowledged during this session however short-term memory deficits currently challenge his ability to retain instruction.   To conclude session, OT provided written direction relating to contact with his wife to request his reading glasses and clothing (to include shoes), as patient repeatedly states that his wife will be bringing clothes for daily ADL.     Therapy Documentation Precautions:  Precautions Precautions: Fall Restrictions Weight Bearing  Restrictions: No  Pain: Pain Assessment Pain Assessment: No/denies pain Pain Score: 2  Faces Pain Scale: Hurts even more Pain Type: Acute pain Pain Location: Head Pain Orientation: Right;Anterior Pain Descriptors / Indicators: Aching Pain Onset: Gradual Patients Stated Pain Goal: 2 Pain Intervention(s): Medication (See eMAR) (2 tylenol, 5 oxy IR) Multiple Pain Sites: No  See FIM for current functional status  Therapy/Group: Individual Therapy  Manroop Jakubowicz 10/30/2012, 12:17 PM

## 2012-10-30 NOTE — Progress Notes (Signed)
Subjective/Complaints: Slept better. Headaches perhaps a little better.  A 12 point review of systems has been performed and if not noted above is otherwise negative.   Objective: Vital Signs: Blood pressure 118/80, pulse 18, temperature 98.4 F (36.9 C), temperature source Oral, resp. rate 18, SpO2 96.00%. No results found.  Recent Labs  10/28/12 0705  WBC 6.8  HGB 12.6*  HCT 35.9*  PLT 194    Recent Labs  10/27/12 1025 10/28/12 0705  NA 137 136  K  --  3.9  CL  --  101  GLUCOSE  --  90  BUN  --  13  CREATININE  --  0.97  CALCIUM  --  9.0   CBG (last 3)   Recent Labs  10/29/12 1639 10/29/12 2029 10/30/12 0715  GLUCAP 127* 302* 108*    Wt Readings from Last 3 Encounters:  10/18/12 92.7 kg (204 lb 5.9 oz)  10/18/12 92.7 kg (204 lb 5.9 oz)    Physical Exam:  Nursing note and vitals reviewed.  Constitutional: He appears well-developed and well-nourished.  HENT:  Head: Normocephalic and atraumatic.  Eyes: Conjunctivae are normal. Pupils are equal, round, and reactive to light.  Neck: Normal range of motion. Neck supple.  Cardiovascular: Normal rate and regular rhythm.  No murmur heard.  Pulmonary/Chest: Effort normal and breath sounds normal. No respiratory distress. He has no wheezes.  Abdominal: Soft. Bowel sounds are normal. He exhibits no distension. There is no tenderness.  Musculoskeletal: He exhibits no edema and no tenderness.  Neurological: He is alert.  Oriented to self and place with cues.  Problems with short term recall. Speech clear and able to follow one and two step commands without difficulty. Strength remains symmetrical but generally 4/5 proximal to distal, lower and upper. Carries conversation, fluid speech.  Skin: Skin is warm and dry.  Psychiatric: He has a normal mood and affect. His speech is normal and behavior is normal. Cognition and memory are impaired. He expresses inappropriate judgment.    Assessment/Plan: 1. 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 Physiatrist is providing close team supervision and 24 hour management of active medical problems listed below. Physiatrist and rehab team continue to assess barriers to discharge/monitor patient progress toward functional and medical goals. FIM: FIM - Bathing Bathing Steps Patient Completed: Chest;Right Arm;Left Arm;Abdomen;Front perineal area;Buttocks;Right upper leg;Left upper leg;Right lower leg (including foot);Left lower leg (including foot) Bathing: 5: Supervision: Safety issues/verbal cues  FIM - Upper Body Dressing/Undressing Upper body dressing/undressing steps patient completed: Thread/unthread right sleeve of pullover shirt/dresss;Thread/unthread left sleeve of pullover shirt/dress;Put head through opening of pull over shirt/dress;Pull shirt over trunk Upper body dressing/undressing: 5: Supervision: Safety issues/verbal cues FIM - Lower Body Dressing/Undressing Lower body dressing/undressing steps patient completed: Thread/unthread right pants leg;Thread/unthread left pants leg;Pull pants up/down;Don/Doff right sock;Don/Doff left sock Lower body dressing/undressing: 5: Supervision: Safety issues/verbal cues  FIM - Toileting Toileting steps completed by patient: Adjust clothing prior to toileting Toileting: 7: Independent: No helper, no device  FIM - Diplomatic Services operational officer Devices: Grab bars Toilet Transfers: 5-To toilet/BSC: Supervision (verbal cues/safety issues);5-From toilet/BSC: Supervision (verbal cues/safety issues)  FIM - Bed/Chair Transfer Bed/Chair Transfer: 7: Supine > Sit: No assist;7: Sit > Supine: No assist  FIM - Locomotion: Wheelchair Distance: 150' Locomotion: Wheelchair: 0: Activity did not occur FIM - Locomotion: Ambulation Locomotion: Ambulation Assistive Devices: Other (comment) (none) Ambulation/Gait  Assistance: 4: Min assist  Locomotion: Ambulation: 4: Travels 150 ft or more with minimal assistance (Pt.>75%)  Comprehension Comprehension Mode: Auditory Comprehension: 5-Understands basic 90% of the time/requires cueing < 10% of the time  Expression Expression Mode: Verbal Expression: 5-Expresses complex 90% of the time/cues < 10% of the time  Social Interaction Social Interaction: 5-Interacts appropriately 90% of the time - Needs monitoring or encouragement for participation or interaction.  Problem Solving Problem Solving: 3-Solves basic 50 - 74% of the time/requires cueing 25 - 49% of the time  Memory Memory: 4-Recognizes or recalls 75 - 89% of the time/requires cueing 10 - 24% of the time  Medical Problem List and Plan:  1. DVT Prophylaxis/Anticoagulation: Mechanical: Sequential compression devices, below knee Bilateral lower extremities  2. Headaches/Pain Management: Will continue prn medications for headaches.   -add qhs topamax  3. Mood: No signs of distress. Will have LCSW follow for evaluation.  4. Neuropsych: This patient is not capable of making decisions on his own behalf.  5. Seizure prophylaxis: Keppra and nimotop completed 6. ABLA : hgb 12.6 7. Elevated CBG---apparently after eating. Not sure while we're still checking cbgs.  DC checks   LOS (Days) 3 A FACE TO FACE EVALUATION WAS PERFORMED  Richard Davenport T 10/30/2012 7:56 AM

## 2012-10-30 NOTE — Progress Notes (Signed)
Speech Language Pathology Daily Session Note  Patient Details  Name: Richard Davenport MRN: [id number] Date of Birth: 11/21/1960  Today's Date: 10/30/2012 Time: 0800-0840 Time Calculation (min): 40 min  Short Term Goals: Week 1: SLP Short Term Goal 1 (Week 1): Pt will utilize call bell to express wants/needs with Mod I.  SLP Short Term Goal 2 (Week 1): Pt will demonstrate functional problem solving for complex tasks with supervision verbal and question cues.  SLP Short Term Goal 3 (Week 1): Pt will utilize external memory aids to recall new, daily information with supervision verbal and visual cues.  SLP Short Term Goal 4 (Week 1): Pt will demonstrate alternating attention between two tasks for 30 minutes with supervision verbal cues for redirection.  SLP Short Term Goal 5 (Week 1): Pt will follow multistep commands with 100% accuracy and Min verbal and question cues.   Skilled Therapeutic Interventions: Treatment focus on cognitive goals. Upon arrival, pt sitting EOB eating breakfast tray. Pt completed meal without overt s/s of aspiration with Mod I. SLP facilitated session by providing Mod verbal cues for auditory comprehension of directions to mildly complex task. Pt unable to complete task due to inability to appropriately see worksheet.  Pt reported his personal glasses were not here, however, SLP reminded pt that he has been utilizing reading glasses provided by the clinician for the past 2 previous therapy sessions without any difficulty. Pt reported he had double vision but was able to read information from aloud from a distance.  [name] wife should be bringing personal glasses today. Continue current plan of care.    FIM:  Comprehension Comprehension Mode: Auditory Comprehension: 4-Understands basic 75 - 89% of the time/requires cueing 10 - 24% of the time Expression Expression Mode: Verbal Expression: 5-Expresses complex 90% of the time/cues < 10% of the time Social Interaction Social  Interaction: 5-Interacts appropriately 90% of the time - Needs monitoring or encouragement for participation or interaction. Problem Solving Problem Solving: 3-Solves basic 50 - 74% of the time/requires cueing 25 - 49% of the time Memory Memory: 3-Recognizes or recalls 50 - 74% of the time/requires cueing 25 - 49% of the time  Pain Pain Assessment Pain Assessment: No/denies pain  Therapy/Group: Individual Therapy  Jeannine Pennisi 10/30/2012, 10:06 AM

## 2012-10-30 NOTE — Progress Notes (Signed)
Occupational Therapy Session Note  Patient Details  Name: Kaleth Koy MRN: [id number] Date of Birth: Mar 17, 1960  Today's Date: 10/30/2012 Time:1400-1430 Time Calculation: 30 minutes.   Short Term Goals: Week 1:  OT Short Term Goal 1 (Week 1): LTG=STG due to estimated brief LOS  Skilled Therapeutic Interventions/Progress Updates:    Pt engaged in session focused on short-term memory, orientation to situation.  Pt oriented to person, time, place.  Stated it was a migraine and a fall that brought him to the hospital, OT gave details for orientation to situation, including timeline and specific diagnoses since admission.  Pt given task of finding gym, an area familiar to pt.  Ambulated to gym with trial and error of walking down incorrect hallway x3, looking in each door and reading numbers on the rooms,  min verbal cues required.  Completed one pipe tree design with min verbal cues, extra time for problem-solving and trial and error to determine correct components.  Headache at conclusion of session, pt utilized call bell to request pain medication.  Pt in bed awaiting nurse at conclusion of session, bed alarm activated.   Therapy Documentation Precautions:  Precautions Precautions: Fall Restrictions Weight Bearing Restrictions: No  Pain: Headache, called for pain medication and awaiting nurse.   See FIM for current functional status  Therapy/Group: Individual Therapy  Kandis Ban 10/30/2012, 3:23 PM

## 2012-10-31 ENCOUNTER — Inpatient Hospital Stay (HOSPITAL_COMMUNITY): Payer: Medicaid Other | Admitting: Physical Therapy

## 2012-10-31 ENCOUNTER — Inpatient Hospital Stay (HOSPITAL_COMMUNITY): Payer: Medicaid Other | Admitting: Speech Pathology

## 2012-10-31 ENCOUNTER — Inpatient Hospital Stay (HOSPITAL_COMMUNITY): Payer: Medicaid Other | Admitting: Occupational Therapy

## 2012-10-31 DIAGNOSIS — R4182 Altered mental status, unspecified: Secondary | ICD-10-CM

## 2012-10-31 DIAGNOSIS — I609 Nontraumatic subarachnoid hemorrhage, unspecified: Secondary | ICD-10-CM

## 2012-10-31 DIAGNOSIS — J9600 Acute respiratory failure, unspecified whether with hypoxia or hypercapnia: Secondary | ICD-10-CM

## 2012-10-31 NOTE — Progress Notes (Signed)
Patient ID: Richard Davenport, male   DOB: 06-11-1960, 52 y.o.   MRN: [id number]  50/104. 52 year old patient admitted to the hospital with altered mental status secondary to a subarachnoid hemorrhage. Hospital course complicated by acute respiratory failure  Subjective/Complaints:  Alert and offering no complaints.  Objective: Vital Signs: Blood pressure 112/75, pulse 87, temperature 98.2 F (36.8 C), temperature source Oral, resp. rate 18, SpO2 96.00%. No results found. No results found for this basename: WBC, HGB, HCT, PLT,  in the last 72 hours No results found for this basename: NA, K, CL, CO, GLUCOSE, BUN, CREATININE, CALCIUM,  in the last 72 hours CBG (last 3)   Recent Labs  10/30/12 1125 10/30/12 1647 10/30/12 2208  GLUCAP 102* 120* 133*    Wt Readings from Last 3 Encounters:  10/18/12 92.7 kg (204 lb 5.9 oz)  10/18/12 92.7 kg (204 lb 5.9 oz)   Patient Vitals for the past 24 hrs:  BP Temp Temp src Pulse Resp SpO2  10/31/12 0447 112/75 mmHg 98.2 F (36.8 C) - 87 18 96 %  10/30/12 1452 117/80 mmHg 97.8 F (36.6 C) Oral 68 18 99 %    Intake/Output Summary (Last 24 hours) at 10/31/12 0919 Last data filed at 10/31/12 0900  Gross per 24 hour  Intake    840 ml  Output      0 ml  Net    840 ml    Physical Exam:   Constitutional: He appears well-developed and well-nourished.  HENT:  Head: Normocephalic and atraumatic.  Eyes: Conjunctivae are normal. Pupils are equal, round, and reactive to light.  Neck: Normal range of motion. Neck supple.  Cardiovascular: Normal rate and regular rhythm.  No murmur heard.  Pulmonary/Chest: Effort normal and breath sounds normal. No respiratory distress. He has no wheezes.  Abdominal: Soft. Bowel sounds are normal. He exhibits no distension. There is no tenderness.  Musculoskeletal: He exhibits no edema and no tenderness.  Neurological: He is alert.  Skin: Skin is warm and dry.  Psychiatric: He has a normal mood and  affect.  Assessment/Plan: 1. 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 2. DVT Prophylaxis/Anticoagulation: Mechanical: Sequential compression devices, below knee 5. 3. Seizure prophylaxis: Keppra and nimotop completed    LOS (Days) 4 A FACE TO FACE EVALUATION WAS PERFORMED  Rogelia Boga 10/31/2012 9:18 AM

## 2012-10-31 NOTE — Progress Notes (Signed)
Physical Therapy Note  Patient Details  Name: Richard Davenport MRN: [id number] Date of Birth: May 24, 1960 Today's Date: 10/31/2012  1515-1610 (55 minutes) individual Pain: no reported pain Focus of treatment : Therapeutic exercise focused on activity tolerance; standing balance.; therapeutic activities focused on completing instructions Treatment: Pt in bed upon arrival; gait to /from room to gym 120 feet SBA without AD with no balance difficulties; exercise tolerance- Nustep level 6 X 15 minutes with min/mod exertion; Cognition (following instructions/ completing task) Wii bowling with mod vcs to use controller, pt completed one game ; Kinetron in standing ( standing balance) without UE assist - 2 X 25 reps with no loss of balance; returned to room (bed) with bed alarm activated.    [name],[name] 10/31/2012, 3:19 PM

## 2012-10-31 NOTE — Progress Notes (Signed)
Speech Language Pathology Daily Session Note  Patient Details  Name: Richard Davenport MRN: [id number] Date of Birth: 07/20/1960  Today's Date: 10/31/2012 Time: 7829-5621 Time Calculation (min): 35 min  Short Term Goals: Week 1: SLP Short Term Goal 1 (Week 1): Pt will utilize call bell to express wants/needs with Mod I.  SLP Short Term Goal 2 (Week 1): Pt will demonstrate functional problem solving for complex tasks with supervision verbal and question cues.  SLP Short Term Goal 3 (Week 1): Pt will utilize external memory aids to recall new, daily information with supervision verbal and visual cues.  SLP Short Term Goal 4 (Week 1): Pt will demonstrate alternating attention between two tasks for 30 minutes with supervision verbal cues for redirection.  SLP Short Term Goal 5 (Week 1): Pt will follow multistep commands with 100% accuracy and Min verbal and question cues.   Skilled Therapeutic Interventions: Skilled treatment session focused on addressing cognitive goals. SLP facilitated session by modify size of text from yesterday's activity and reading aloud to patient, due to glasses not being present.  Patient required Min verbal recall sum stated by SLP, due to decreased working memory and Supervision level verbal cues to self-monitor and correct calculation errors.  Continue current plan of care.   FIM:  Comprehension Comprehension Mode: Auditory Comprehension: 4-Understands basic 75 - 89% of the time/requires cueing 10 - 24% of the time Expression Expression Mode: Verbal Expression: 5-Expresses complex 90% of the time/cues < 10% of the time Social Interaction Social Interaction: 5-Interacts appropriately 90% of the time - Needs monitoring or encouragement for participation or interaction. Problem Solving Problem Solving: 5-Solves basic 90% of the time/requires cueing < 10% of the time Memory Memory: 3-Recognizes or recalls 50 - 74% of the time/requires cueing 25 - 49% of the time FIM -  Eating Eating Activity: 7: Complete independence:no helper  Pain Pain Assessment Pain Assessment: No/denies pain  Therapy/Group: Individual Therapy  Charlane Ferretti., CCC-SLP [phone]  Audianna Landgren 10/31/2012, 4:21 PM

## 2012-10-31 NOTE — Progress Notes (Signed)
Physical Therapy Session Note  Patient Details  Name: Richard Davenport MRN: [id number] Date of Birth: 02-08-1960  Today's Date: 10/31/2012 Time: 1130 - 1200 Time Calculation (min): 30 min  Short Term Goals: Week 1:  PT Short Term Goal 1 (Week 1): STGs=LTGs  Therapy Documentation Precautions:  Precautions Precautions: Fall Restrictions Weight Bearing Restrictions: No General: Missed Time Reason:  (Pt had visitors and did not want to leave them.) Vital Signs: Therapy Vitals Temp: 97.5 F (36.4 C) Temp src: Oral Pulse Rate: 91 Resp: 18 BP: 132/92 mmHg Patient Position, if appropriate: Sitting Oxygen Therapy SpO2: 97 % O2 Device: None (Room air) Pain: No/denies pain  Gait Training:(30') ambulating in hallways, outdoors on brick and cement walk areas, up/down stairs and on uneven surfaces with S/Independence x 1000 feet. Patient tends to drift toward the right side especially when turning Left but no LOB noted throughout treatment session.     Therapy/Group: Individual Therapy  Rex Kras 10/31/2012, 6:51 PM

## 2012-10-31 NOTE — Progress Notes (Signed)
Occupational Therapy Session Note  Patient Details  Name: Richard Davenport MRN: [id number] Date of Birth: 1960-08-01  Today's Date: 10/31/2012 Time: 4098-1191 Time Calculation (min): 55 min  Skilled Therapeutic Interventions/Progress Updates:    Pt worked in therapeutic activities group on dynamic standing balance activities including standing on foam surface, walking backwards, standing in tandem, and standing on one leg.  Had pt also work on coordinating UE functional use and balance at the same time dividing attention.  He demonstrated decreased ability to coordinate movements with the UE.  He was able to perform all balance tasks except standing on the left foot with independence.  Pt missed 18 mins of therapy secondary to having visitors in his room at start of session.  Therapy Documentation Precautions:  Precautions Precautions: Fall Restrictions Weight Bearing Restrictions: No General: General Amount of Missed OT Time (min): 18 Minutes Missed Time Reason:  (Pt had visitors and did not want to leave them.)  Pain: Pain Assessment Pain Assessment: No/denies pain  See FIM for current functional status  Therapy/Group: Group Therapy  Laythan Hayter OTR/L 10/31/2012, 3:56 PM

## 2012-11-01 ENCOUNTER — Inpatient Hospital Stay (HOSPITAL_COMMUNITY): Payer: Medicaid Other | Admitting: Physical Therapy

## 2012-11-01 NOTE — Progress Notes (Signed)
Physical Therapy Note  Patient Details  Name: Richard Davenport MRN: [id number] Date of Birth: 06/25/1960 Today's Date: 11/01/2012  1110-1155 (45 minutes) individual Pain: no reported pain Focus of treatment: Therapeutic exercise focused on activity tolerance; therapeutic activity focused on performing divided attention task with cues Treatment: Pt in bed upon arrival; pt able to locate family room with min cues and prepare cup of coffee independently; gait within hospital SBA; Nustep level 6 X 10 minutes ; therapeutic activity locating gift shop following signs with min vcs for east vs west directions.    Richard Davenport,[name] 11/01/2012, 11:23 AM

## 2012-11-01 NOTE — Progress Notes (Signed)
Patient ID: Richard Davenport, male   DOB: 15-Apr-1960, 52 y.o.   MRN: [id number]  Patient ID: Richard Davenport, male   DOB: 1960-11-11, 52 y.o.   MRN: [id number]  98/89. 52 year old patient admitted to the hospital with altered mental status secondary to a subarachnoid hemorrhage. Hospital course complicated by acute respiratory failure  Subjective/Complaints:  Alert and offering no complaints.  States he ambulates without difficulty  Objective: Vital Signs: Blood pressure 109/69, pulse 79, temperature 98.7 F (37.1 C), temperature source Oral, resp. rate 16, SpO2 97.00%. No results found. No results found for this basename: WBC, HGB, HCT, PLT,  in the last 72 hours No results found for this basename: NA, K, CL, CO, GLUCOSE, BUN, CREATININE, CALCIUM,  in the last 72 hours CBG (last 3)   Recent Labs  10/30/12 1125 10/30/12 1647 10/30/12 2208  GLUCAP 102* 120* 133*    Wt Readings from Last 3 Encounters:  10/18/12 92.7 kg (204 lb 5.9 oz)  10/18/12 92.7 kg (204 lb 5.9 oz)   Patient Vitals for the past 24 hrs:  BP Temp Temp src Pulse Resp SpO2  11/01/12 0618 109/69 mmHg 98.7 F (37.1 C) Oral 79 16 97 %  10/31/12 1458 132/92 mmHg 97.5 F (36.4 C) Oral 91 18 97 %    Intake/Output Summary (Last 24 hours) at 11/01/12 0837 Last data filed at 10/31/12 1800  Gross per 24 hour  Intake    840 ml  Output      0 ml  Net    840 ml    Physical Exam:   Constitutional: He appears well-developed and well-nourished.  HENT:  Head: Normocephalic and atraumatic.  Eyes: Conjunctivae are normal. Pupils are equal, round, and reactive to light.  Neck: Normal range of motion. Neck supple.  Cardiovascular: Normal rate and regular rhythm.  No murmur heard.  Pulmonary/Chest: Effort normal and breath sounds normal. No respiratory distress. He has no wheezes.  Abdominal: Soft. Bowel sounds are normal. He exhibits no distension. There is no tenderness.  Musculoskeletal: He exhibits no edema and no tenderness.   Neurological: He is alert.  Skin: Skin is warm and dry.  Psychiatric: He has a normal mood and affect.  Assessment/Plan: 1. 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 2. DVT Prophylaxis/Anticoagulation: Mechanical: Sequential compression devices, below knee 5. 3. 3. Seizure prophylaxis: Keppra and nimotop completed    LOS (Days) 5 A FACE TO FACE EVALUATION WAS PERFORMED  Rogelia Boga 11/01/2012 8:37 AM

## 2012-11-02 ENCOUNTER — Inpatient Hospital Stay (HOSPITAL_COMMUNITY): Payer: Medicaid Other | Admitting: Speech Pathology

## 2012-11-02 ENCOUNTER — Inpatient Hospital Stay (HOSPITAL_COMMUNITY): Payer: Medicaid Other

## 2012-11-02 NOTE — Progress Notes (Signed)
Subjective/Complaints: Headaches improved. Has an occasional spike, but overall much better. .  A 12 point review of systems has been performed and if not noted above is otherwise negative.   Objective: Vital Signs: Blood pressure 108/73, pulse 88, temperature 98.4 F (36.9 C), temperature source Oral, resp. rate 16, SpO2 96.00%. No results found. No results found for this basename: WBC, HGB, HCT, PLT,  in the last 72 hours No results found for this basename: NA, K, CL, CO, GLUCOSE, BUN, CREATININE, CALCIUM,  in the last 72 hours CBG (last 3)   Recent Labs  10/30/12 1125 10/30/12 1647 10/30/12 2208  GLUCAP 102* 120* 133*    Wt Readings from Last 3 Encounters:  10/18/12 92.7 kg (204 lb 5.9 oz)  10/18/12 92.7 kg (204 lb 5.9 oz)    Physical Exam:  Nursing note and vitals reviewed.  Constitutional: He appears well-developed and well-nourished.  HENT:  Head: Normocephalic and atraumatic.  Eyes: Conjunctivae are normal. Pupils are equal, round, and reactive to light.  Neck: Normal range of motion. Neck supple.  Cardiovascular: Normal rate and regular rhythm.  No murmur heard.  Pulmonary/Chest: Effort normal and breath sounds normal. No respiratory distress. He has no wheezes.  Abdominal: Soft. Bowel sounds are normal. He exhibits no distension. There is no tenderness.  Musculoskeletal: He exhibits no edema and no tenderness.  Neurological: He is alert.  Oriented to self and place with cues.  Problems with short term recall. Speech clear and able to follow one and two step commands without difficulty. Strength remains symmetrical but generally 4/5 proximal to distal, lower and upper. Carries conversation, fluid speech.  Skin: Skin is warm and dry.  Psychiatric: He has a normal mood and affect. His speech is normal and behavior is normal. Cognition and memory are impaired. He expresses inappropriate judgment.    Assessment/Plan: 1. 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 Physiatrist is providing close team supervision and 24 hour management of active medical problems listed below. Physiatrist and rehab team continue to assess barriers to discharge/monitor patient progress toward functional and medical goals. FIM: FIM - Bathing Bathing Steps Patient Completed: Chest;Right Arm;Left Arm;Abdomen;Front perineal area;Buttocks;Right upper leg;Left upper leg;Right lower leg (including foot);Left lower leg (including foot) Bathing: 5: Supervision: Safety issues/verbal cues  FIM - Upper Body Dressing/Undressing Upper body dressing/undressing steps patient completed: Thread/unthread right sleeve of pullover shirt/dresss;Thread/unthread left sleeve of pullover shirt/dress;Put head through opening of pull over shirt/dress;Pull shirt over trunk Upper body dressing/undressing: 5: Supervision: Safety issues/verbal cues FIM - Lower Body Dressing/Undressing Lower body dressing/undressing steps patient completed: Thread/unthread right pants leg;Thread/unthread left pants leg;Pull pants up/down;Don/Doff left sock;Don/Doff right shoe Lower body dressing/undressing: 5: Supervision: Safety issues/verbal cues  FIM - Toileting Toileting steps completed by patient: Adjust clothing prior to toileting;Performs perineal hygiene;Adjust clothing after toileting Toileting: 7: Independent: No helper, no device  FIM - Diplomatic Services operational officer Devices: Grab bars Toilet Transfers: 5-To toilet/BSC: Supervision (verbal cues/safety issues);5-From toilet/BSC: Supervision (verbal cues/safety issues)  FIM - Bed/Chair Transfer Bed/Chair Transfer: 7: Supine > Sit: No assist;5: Bed > Chair or W/C: Supervision (verbal cues/safety issues);5: Chair or W/C > Bed: Supervision (verbal cues/safety issues)  FIM - Locomotion: Wheelchair Distance: 150' Locomotion: Wheelchair: 0: Activity did  not occur FIM - Locomotion: Ambulation Locomotion: Ambulation Assistive Devices: Other (comment) (none) Ambulation/Gait Assistance: 5: Supervision Locomotion: Ambulation: 5: Travels 150 ft or more with supervision/safety issues  Comprehension  Comprehension Mode: Auditory Comprehension: 4-Understands basic 75 - 89% of the time/requires cueing 10 - 24% of the time  Expression Expression Mode: Verbal Expression: 5-Expresses complex 90% of the time/cues < 10% of the time  Social Interaction Social Interaction: 5-Interacts appropriately 90% of the time - Needs monitoring or encouragement for participation or interaction.  Problem Solving Problem Solving: 5-Solves basic 90% of the time/requires cueing < 10% of the time  Memory Memory: 3-Recognizes or recalls 50 - 74% of the time/requires cueing 25 - 49% of the time  Medical Problem List and Plan:  1. DVT Prophylaxis/Anticoagulation: Mechanical: Sequential compression devices, below knee Bilateral lower extremities  2. Headaches/Pain Management: Will continue prn medications for headaches.   -added qhs topamax which has helped 3. Mood: No signs of distress. Will have LCSW follow for evaluation.  4. Neuropsych: This patient is not capable of making decisions on his own behalf.  5. Seizure prophylaxis: Keppra and nimotop completed 6. ABLA : hgb 12.6 7. Elevated CBG---apparently after eating. Not sure while we're still checking cbgs.  DC checks   LOS (Days) 6 A FACE TO FACE EVALUATION WAS PERFORMED  Naithen Rivenburg T 11/02/2012 7:58 AM

## 2012-11-02 NOTE — Consult Note (Signed)
NEUROCOGNITIVE TESTING - [facility]   [name] is a 52 year old [place] American man, who was seen for brief neuropsychological assessment to assess cognitive and emotional functioning post-aneurysm rupture.  According to his medical record, he was admitted on 10/08/12 with unresponsiveness due to diffuse subarachnoid hematoma in basal cisterns and bilateral sylvian fissures with intraventricular hemorrhage.   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  Richard Davenport was extubated on 10/10/12.  Follow-up CCT on 10/2 demonstrated continued resolution of subarachnoid hematoma, no cortical infarct or hydrocephalus.  EVD was removed on 10/2.    PROCEDURES: [3 units of 09811 on 10/29/2012]  The following tests were performed during today's visit: Mini Mental Status Examination-w (brief version), Repeatable Battery for the Assessment of Neuropsychological Status (RBANS, form A), and Beck Depression Inventory (fast screen for medical patients).  Test results are as follows:   MMSE-2 brief Raw Score = 8/16 Description = Impaired   RBANS Indices Scaled Score Percentile Description  Immediate Memory  53 < 1 Profoundly Impaired  Visuospatial/Constructional 92 30 Average  Language 51 < 1 Profoundly Impaired  Attention 82 12 Below Average  Delayed Memory 52 < 1 Profoundly Impaired  Total Score 56 < 1 Profoundly Impaired   RBANS Subtests Raw Score Percentile Description  List Learning 18 2 Profoundly Impaired  Story Memory 3 < 1 Profoundly Impaired  Figure Copy 19 70 Average  Line Orientation 14 19 Below Average  Picture Naming 6 < 1 Profoundly Impaired  Semantic Fluency 8 < 1 Profoundly Impaired  Digit Span 11 58 Average  Coding 22 < 1 Profoundly Impaired  List Recall 0 < 1 Profoundly Impaired  List Recognition 17 1 Profoundly Impaired  Story Recall 1 < 1 Profoundly Impaired  Figure recall 0 < 1 Profoundly Impaired    BDI (fast) Raw Score = 3/15 Description = WNL   Test results revealed impairments in multiple cognitive domains at the level of dementia, with relative sparing of simple attention and visuospatial abilities.  It is probable that certain test scores were adversely impacted by English being his second language, though language barriers cannot likely fully account for the magnitude of impairments seen.  It is likely that he is experiencing trouble with memory encoding and consolidation as well as problems with processing speed that are secondary to his aneurysm.    From an emotional standpoint, Richard Davenport did not endorse symptoms consistent with clinically significant depression.  Subjectively, he remarked that his biggest concern is pain associated with his headache.  He would like information regarding when to expect his pain to abate.  He also requested a consultation regarding medication that he can take for his pain, as he feels a prescription medication may be more effective than Tylenol or ibuprofen.    In light of these findings, the following recommendations are provided.    RECOMMENDATIONS:  Recommendations for treatment team:     Provide Richard Davenport with information regarding headache following aneurysm for educational purposes.     Consider whether alternative treatments for headache would be medically indicated or not and if not, explain reasoning to Richard Davenport.     When interacting with Richard Davenport, directions and information should be provided in a simple, straight forward manner, and the treatment team should avoid giving multiple instructions simultaneously.    Richard Davenport may also benefit from being provided with multiple trials to learn new skills given the noted memory inefficiencies.  To the extent possible, multitasking should be avoided.   Richard Davenport requires more time than typical to process information. The treatment team may benefit from waiting for a verbal response to information  before presenting additional information.    Performance will generally be best in a structured, routine, and familiar environment, as opposed to situations involving complex problems.   Recommendations for discharge planning:    Complete a comprehensive neuropsychological evaluation as an outpatient in 8-12 months to assess for interval change.   Maintain engagement in mentally, physically and cognitively stimulating activities.    Strive to maintain a healthy lifestyle (e.g., proper diet and exercise) in order to promote physical, cognitive and emotional health.   Leavy Cella, Psy.D.  Clinical Neuropsychologist

## 2012-11-02 NOTE — Progress Notes (Signed)
Speech Language Pathology Daily Session Notes  Patient Details  Name: Richard Davenport MRN: [id number] Date of Birth: 12/13/1960  Today's Date: 11/02/2012  Session 1 Time: 4098-1191 Time Calculation (min): 55 min  Session 2 Time: 1300-1325 Time Calculation: 25 min  Short Term Goals: Week 1: SLP Short Term Goal 1 (Week 1): Pt will utilize call bell to express wants/needs with Mod I.  SLP Short Term Goal 2 (Week 1): Pt will demonstrate functional problem solving for complex tasks with supervision verbal and question cues.  SLP Short Term Goal 3 (Week 1): Pt will utilize external memory aids to recall new, daily information with supervision verbal and visual cues.  SLP Short Term Goal 4 (Week 1): Pt will demonstrate alternating attention between two tasks for 30 minutes with supervision verbal cues for redirection.  SLP Short Term Goal 5 (Week 1): Pt will follow multistep commands with 100% accuracy and Min verbal and question cues.   Skilled Therapeutic Interventions:  Session 1: Treatment focus on cognitive goals. SLP facilitated session by providing Mod A verbal, visual and question cues to self-monitor and correct errors during 4 and 6 step picture sequencing task. Pt continues to demonstrate decreased attention to task and requires mod verbal cues to decrease impulsivity throughout tasks. Pt also required Mod question and visual cues to recall events from the morning. Initiated use of writing important information down on schedule to increase recall and carryover of newly learned information.    Session 2: Treatment focus on cognitive goals. Upon arrival, pt asleep in bed with lunch on bedside table. Pt sat EOB to consume meal of regular textures with thin liquids without overt s/s of aspiration and alternated attention between meal and functional conversation with supervision verbal cues. Pt also required total A for anticipatory awareness in regards to d/c planning. Continue current plan of  care.   FIM:  Comprehension Comprehension Mode: Auditory Comprehension: 4-Understands basic 75 - 89% of the time/requires cueing 10 - 24% of the time Expression Expression Mode: Verbal Expression: 4-Expresses basic 75 - 89% of the time/requires cueing 10 - 24% of the time. Needs helper to occlude trach/needs to repeat words. Social Interaction Social Interaction: 5-Interacts appropriately 90% of the time - Needs monitoring or encouragement for participation or interaction. Problem Solving Problem Solving: 3-Solves basic 50 - 74% of the time/requires cueing 25 - 49% of the time Memory Memory: 3-Recognizes or recalls 50 - 74% of the time/requires cueing 25 - 49% of the time FIM - Eating Eating Activity: 7: Complete independence:no helper  Pain Pain Assessment Pain Assessment: No/denies pain Pain Score: 5  Pain Type: Acute pain Pain Location: Head Pain Orientation: Right;Anterior Pain Descriptors / Indicators: Aching Pain Onset: Gradual Patients Stated Pain Goal: 2 Pain Intervention(s): RN made aware Multiple Pain Sites: No  Therapy/Group: Individual Therapy  Charika Mikelson 11/02/2012, 12:02 PM

## 2012-11-02 NOTE — Progress Notes (Signed)
Occupational Therapy Session Note  Patient Details  Name: Richard Davenport MRN: [id number] Date of Birth: 30-Jan-1960  Today's Date: 11/02/2012 Time: 1030-1130 Time Calculation (min): 60 min  Short Term Goals: Week 1:  OT Short Term Goal 1 (Week 1): LTG=STG due to estimated brief LOS  Skilled Therapeutic Interventions/Progress Updates: ADL-retraining with emphasis on improved cognition during ADL as evidenced by ability to complete routine ADL with attention alternating from performance of ADL to discussion of occupational and leisure skills in the presence of distracting stimulation (visitor and television).   Patient completed toileting (BM), performed shower and dressing standing in bathroom, completed shaving and oral care standing at sink, to include managing supplies, faucets and fixtures, unassisted while engaged in intermittent dialog with therapist during this session.   Patient able to don compressive stocking sitting on edge of bed with only initial instruction to correctly align heels.   During review of performance patient displayed his daily schedule of activities and requested clarification on what was accomplished during treatment, re-writing ADL tasks performed on his schedule, as advised by SLP for improved recall.   OT noted that patient now possesses an additional shirt, shoes and reading glasses provided by his wife as advised and written during previous treatments.  Patient requested use of hair dryer for improved hair care and was advised to alert staff to need prior to ADL.  Therapy Documentation Precautions:  Precautions Precautions: Fall Restrictions Weight Bearing Restrictions: No  Pain: Pain Assessment Pain Assessment: No/denies pain Pain Score: 5  Pain Type: Acute pain Pain Location: Head Pain Orientation: Right;Anterior Pain Descriptors / Indicators: Aching Pain Onset: Gradual Patients Stated Pain Goal: 2 Pain Intervention(s): RN made aware Multiple Pain Sites:  No  See FIM for current functional status  Therapy/Group: Individual Therapy  Scottlyn Mchaney 11/02/2012, 12:02 PM

## 2012-11-02 NOTE — Progress Notes (Signed)
Physical Therapy Session Note  Patient Details  Name: Arne Schlender MRN: [id number] Date of Birth: 1960-08-15  Today's Date: 11/02/2012 Time: 1308-6578 Time Calculation (min): 45 min  Short Term Goals: Week 1:  PT Short Term Goal 1 (Week 1): STGs=LTGs  Skilled Therapeutic Interventions/Progress Updates:  1:1. Pt received supine in bed able to t/f sup<>sit EOB independently. Pt denies having any pain. Focus this tx session on problem solving, pathfinding, recall as well as dynamic standing balance. At start of session, pt unable to recall what he did with occupational and speech therapists prior to this session. Pt giving vague answers such as, "oh you know" or "we probably did something with math or walked around a little bit." Pt also did not attempt to utilize personal daily schedule on table in front of him, which each therapist wrote down exactly what they did. While signing pt out at desk, therapist questioned pt regarding what day it was as well as time. Pt unable to correctly answer either, mod verbal cues to attend to calendar and clock behind desk. Prior to attempt at reading either, pt stated well I can't bc I don't have my glasses. Pt reminded that he did have his glasses and then walked over to stand approx 6" in front of wall calendar to read. Pt asked to find main lobby w/ slight improvement in ability to read sign and interpret basic parts of sign, however, continues to demonstrate very poor recall of final destination location (floor, zone) w/ mod-max verbal cues required to re-orient pt to target location at every direction sign. Pt again trying to mask difficulty w/ task by saying, "oh I just don't do this often." Activity has been repeated daily w/ pt. Pt able to amb >500' w/ overall supervision and no LOB in busy community environments. At end of session focus on dynamic standing balance w/ cognitive challenge. Pt performed various stances on compliant surface including natural BOS,  narrow BOS and semi-tandem stance (both feet leading) while tossing ball to therapy tech. Pt was instructed to pass ball and come up with a food (1st round) or country (2nd round) that started w/ the letter on the ball that his hand landed on. Pt demonstrating difficulty recalling words as well as repeating answers previously used. Pt demonstrated 1 minor LOB during semi-tandem stance w/ L foot lead, however, able to self correct. Pt supine in bed at end of session w/ all needs in reach and bed alarm on.   Therapy Documentation Precautions:  Precautions Precautions: Fall Restrictions Weight Bearing Restrictions: No  See FIM for current functional status  Therapy/Group: Individual Therapy  Denzil Hughes 11/02/2012, 3:55 PM

## 2012-11-03 ENCOUNTER — Inpatient Hospital Stay (HOSPITAL_COMMUNITY): Payer: Medicaid Other | Admitting: Speech Pathology

## 2012-11-03 ENCOUNTER — Inpatient Hospital Stay (HOSPITAL_COMMUNITY): Payer: Self-pay | Admitting: Occupational Therapy

## 2012-11-03 ENCOUNTER — Inpatient Hospital Stay (HOSPITAL_COMMUNITY): Payer: Medicaid Other

## 2012-11-03 DIAGNOSIS — I10 Essential (primary) hypertension: Secondary | ICD-10-CM

## 2012-11-03 DIAGNOSIS — I609 Nontraumatic subarachnoid hemorrhage, unspecified: Secondary | ICD-10-CM

## 2012-11-03 NOTE — Progress Notes (Signed)
Physical Therapy Session Note  Patient Details  Name: Richard Davenport MRN: [id number] Date of Birth: 01/24/1960  Today's Date: 11/03/2012 Time: Treatment Session 1: 0915-1000 ; Treatment Session 2: 4098-1191 Time Calculation (min): Treatment Session 1: 45 min; Treatment Session 2:  Short Term Goals: Week 1:  PT Short Term Goal 1 (Week 1): STGs=LTGs  Skilled Therapeutic Interventions/Progress Updates:  Treatment Session 1:  1:1. Pt received lying supine in bed, ready for therapy. Focus this session on assessment of higher level balance as well as safety during stairs, car t/f and floor t/f. [name] balance assessed w/ use of Berg Balance Test- scored 52/56 (previous 38/56) and w/ the Dynamic Gait Index- scored 21/24 (previous 9/24) indicating pt's balance has significantly improved, however, mild balance impairments still present. Pt educated on results, see objective details below. Pt able to complete stair negotiation (up/down 8 steps w/ single rail and reciprocal pattern), floor and car transfers w/ overall supervision and min verbal cues. However, during car transfer pt entered on the passenger side of the car then scooted over to the driver's seat. When pt asked whether he thought he was safe to drive he stated, "I don't see why not, I feel fine." Educated pt on cognitive impairments including decreased memory and pathfinding skills. Pt also stating this tx session, "I just don't know why I have this headache all the time." Pt able to recall why he was in the hospital, but unable make connection to his head hurting. Pt denies having impairments specifically cognitive despite education, "my memory is fine I remember everything." Pt supine in bed at end of session w/ all needs in reach, bed alarm on.   Treatment Session 1:  1:1. Pt received supine in bed, ready for therapy. Focus this session on pathfinding and problem solving. Pt unable to recall what location he and therapist have practiced  finding and walking to every day. Pt re-oriented to room location as well as target location of finding the Main Lobby prior to leaving his room. After amb 50' to nurses station, pt unable to recall target location. Pt req mod-max verbal cues to locate and remember target location of main lobby as well as only consistently able to name two room identifies (floor and room number, unable to remember zone), despite repeation not only w/in session, but daily. Pt able to amb >500' w/ supervision in busy community environments, no LOB noted. Pt supine in bed at end of session w/ all needs in reach and bed alarm on.   Although pt able to perform basic mobility in room at [facility]) level, cognitive impairments discussed above limit pt's ability to be cleared for mod(I) in room.   Therapy Documentation Precautions:  Precautions Precautions: Fall Restrictions Weight Bearing Restrictions: No   Pain: Pain Assessment Pain Assessment: Faces Faces Pain Scale: Hurts even more Pain Type: Acute pain Pain Location: Head Pain Descriptors / Indicators: Headache Pain Intervention(s): RN made aware    Balance: Standardized Balance Assessment Standardized Balance Assessment: Berg Balance Test Berg Balance Test Sit to Stand: Able to stand without using hands and stabilize independently Standing Unsupported: Able to stand safely 2 minutes Sitting with Back Unsupported but Feet Supported on Floor or Stool: Able to sit safely and securely 2 minutes Stand to Sit: Sits safely with minimal use of hands Transfers: Able to transfer safely, minor use of hands Standing Unsupported with Eyes Closed: Able to stand 10 seconds safely Standing Ubsupported with Feet Together: Able to place feet together independently  and stand 1 minute safely From Standing, Reach Forward with Outstretched Arm: Can reach confidently >25 cm (10") From Standing Position, Pick up Object from Floor: Able to pick up shoe safely and easily From  Standing Position, Turn to Look Behind Over each Shoulder: Turn sideways only but maintains balance Turn 360 Degrees: Able to turn 360 degrees safely in 4 seconds or less Standing Unsupported, Alternately Place Feet on Step/Stool: Able to stand independently and safely and complete 8 steps in 20 seconds Standing Unsupported, One Foot in Front: Able to plae foot ahead of the other independently and hold 30 seconds Standing on One Leg: Able to lift leg independently and hold 5-10 seconds Total Score: 52 Dynamic Gait Index Level Surface: Normal Change in Gait Speed: Normal Gait with Horizontal Head Turns: Mild Impairment (veers from straight path) Gait with Vertical Head Turns: Mild Impairment (R trunk lean becomes more prominent) Gait and Pivot Turn: Normal Step Over Obstacle: Normal Step Around Obstacles: Mild Impairment (2/2 impulsivity) Steps: Normal Total Score: 21  See FIM for current functional status  Therapy/Group: Individual Therapy  Denzil Hughes 11/03/2012, 12:13 PM

## 2012-11-03 NOTE — Progress Notes (Signed)
Occupational Therapy Session Note  Patient Details  Name: Quaid Yeakle MRN: [id number] Date of Birth: [birth year]  Today's Date: 11/03/2012 Time: 0815-0900 Time Calculation (min): 45 min  Short Term Goals: Week 1:  OT Short Term Goal 1 (Week 1): LTG=STG due to estimated brief LOS  Skilled Therapeutic Interventions/Progress Updates:  1:1 tx. Pt lying in bed upon OT arrival. Pt stated he was tired but willing to work with OT. Pt able to recall 3 prior tx sessions and what occurred during each one w/min cues. Pt able to follow directions to therapy gym for cognitive/memory tasks. Pt initially needed mod cues to complete novel memory/sequencing task that's repetitive in nature but once he got the hang of it he was able to continue task w/o verbal cues; however, pt interrupted for 2 minutes and asked to resume task. Pt then needed mod-max cues to reinitiate and remember steps. At end of tx, pt asked to find way back to room. He was able to do so with only 1 cue: he asked "I'm on the 4th floor right?" Once that question was answered, he navigated self back to room. Pt left EOB w/lunch tray in front of him and bed alarm set.  Therapy Documentation Precautions:  Precautions Precautions: Fall Restrictions Weight Bearing Restrictions: No Pain: Pain Assessment Pain Assessment: Faces Pain Score: 4  Faces Pain Scale: Hurts even more Pain Location: Head Pain Descriptors / Indicators: Headache Pain Intervention(s): RN made aware  See FIM for current functional status  Therapy/Group: Individual Therapy  Hilary Pundt, Deidre Ala 11/03/2012, 1:09 PM

## 2012-11-03 NOTE — Progress Notes (Signed)
Speech Language Pathology Daily Session Note  Patient Details  Name: Richard Davenport MRN: [id number] Date of Birth: October 25, 1960  Today's Date: 11/03/2012 Time: 1000-1040 Time Calculation (min): 40 min  Short Term Goals: Week 1: SLP Short Term Goal 1 (Week 1): Pt will utilize call bell to express wants/needs with Mod I.  SLP Short Term Goal 2 (Week 1): Pt will demonstrate functional problem solving for complex tasks with supervision verbal and question cues.  SLP Short Term Goal 3 (Week 1): Pt will utilize external memory aids to recall new, daily information with supervision verbal and visual cues.  SLP Short Term Goal 4 (Week 1): Pt will demonstrate alternating attention between two tasks for 30 minutes with supervision verbal cues for redirection.  SLP Short Term Goal 5 (Week 1): Pt will follow multistep commands with 100% accuracy and Min verbal and question cues.   Skilled Therapeutic Interventions: Treatment focus on cognitive goals. SLP facilitated session by providing Max A semantic cues for utilization of memory compensatory strategies in regards to writing important information down and making associations between functional information to increase recall and carryover during functional task. Although pt required Max multimodal cueing during memory task, pt continues to require Max A cueing in regards to memory deficits and impact on his overall function, safety and independence.    FIM:  Comprehension Comprehension Mode: Auditory Comprehension: 4-Understands basic 75 - 89% of the time/requires cueing 10 - 24% of the time Expression Expression Mode: Verbal Expression: 4-Expresses basic 75 - 89% of the time/requires cueing 10 - 24% of the time. Needs helper to occlude trach/needs to repeat words. Social Interaction Social Interaction: 5-Interacts appropriately 90% of the time - Needs monitoring or encouragement for participation or interaction. Problem Solving Problem Solving: 3-Solves  basic 50 - 74% of the time/requires cueing 25 - 49% of the time Memory Memory: 3-Recognizes or recalls 50 - 74% of the time/requires cueing 25 - 49% of the time  Pain Pain Assessment Pain Assessment: No/denies pain  Therapy/Group: Individual Therapy  [name] 11/03/2012, 2:15 PM

## 2012-11-03 NOTE — Progress Notes (Signed)
Occupational Therapy Session Note  Patient Details  Name: Richard Davenport MRN: [id number] Date of Birth: 11-13-1960  Today's Date: 11/03/2012 Time:  -     Short Term Goals: Week 1:  OT Short Term Goal 1 (Week 1): LTG=STG due to estimated brief LOS  Skilled Therapeutic Interventions/Progress Updates: Therapeutic activity with emphasis on patient education on use of compensatory strategies and technologies for memory impairment during routine ADL and iADL.   Re-assessed and re-educated patient on his ability to manage and organize supplies and problem-solve independently during selected activity using check-list and/or schedule with no additional cues.   Patient was directed to read his schedule and perform task as described.  Patient recovered his glasses, read schedule and verified that he had permission to continue without assistance.   While sitting at edge of bed, patient independently questioned if bed alarm was still active prior to exiting.   At 8:30 a.m. patient proceeded directly to shower and completed toileting, bathing, and dressing leaving bathroom 1X to recover additional body wash before returning to being shower.  Patient completed shower at 8:48, bathed and dressed, and proceeded immediately to sink to brush his teeth.   Patient independently recalled from treatment 11/02/12 to ask staff for a hair dryer, which was provided by request this session, to allow him to complete hair care standing at sink.     Therapy Documentation Precautions:  Precautions Precautions: Fall Restrictions Weight Bearing Restrictions: No General:   Vital Signs: Therapy Vitals Temp: 98.2 F (36.8 C) Temp src: Oral Pulse Rate: 74 Resp: 16 BP: 111/72 mmHg Patient Position, if appropriate: Lying Oxygen Therapy SpO2: 96 % O2 Device: None (Room air) Pain:   ADL:   Exercises:   Other Treatments:    See FIM for current functional status  Therapy/Group: Individual  Therapy  Izell Labat 11/03/2012, 8:54 AM

## 2012-11-03 NOTE — Progress Notes (Signed)
Subjective/Complaints: Headaches much improved. No complaints this am  A 12 point review of systems has been performed and if not noted above is otherwise negative.   Objective: Vital Signs: Blood pressure 111/72, pulse 74, temperature 98.2 F (36.8 C), temperature source Oral, resp. rate 16, SpO2 96.00%. No results found. No results found for this basename: WBC, HGB, HCT, PLT,  in the last 72 hours No results found for this basename: NA, K, CL, CO, GLUCOSE, BUN, CREATININE, CALCIUM,  in the last 72 hours CBG (last 3)  No results found for this basename: GLUCAP,  in the last 72 hours  Wt Readings from Last 3 Encounters:  10/18/12 92.7 kg (204 lb 5.9 oz)  10/18/12 92.7 kg (204 lb 5.9 oz)    Physical Exam:  Nursing note and vitals reviewed.  Constitutional: He appears well-developed and well-nourished.  HENT:  Head: Normocephalic and atraumatic.  Eyes: Conjunctivae are normal. Pupils are equal, round, and reactive to light.  Neck: Normal range of motion. Neck supple.  Cardiovascular: Normal rate and regular rhythm.  No murmur heard.  Pulmonary/Chest: Effort normal and breath sounds normal. No respiratory distress. He has no wheezes.  Abdominal: Soft. Bowel sounds are normal. He exhibits no distension. There is no tenderness.  Musculoskeletal: He exhibits no edema and no tenderness.  Neurological: He is alert.  Oriented to self and place with cues.Marland Kitchen Speech clear and able to follow one and two step commands without difficulty. Strength remains symmetrical but generally 4+/5 proximal to distal, lower and upper. Carries conversation, fluid speech.  Skin: Skin is warm and dry.  Psychiatric: He has a normal mood and affect. His speech is normal and behavior is normal.   Assessment/Plan: 1. 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 Physiatrist is providing close team supervision and 24  hour management of active medical problems listed below. Physiatrist and rehab team continue to assess barriers to discharge/monitor patient progress toward functional and medical goals. FIM: FIM - Bathing Bathing Steps Patient Completed: Chest;Right Arm;Left Arm;Abdomen;Front perineal area;Buttocks;Right upper leg;Left upper leg;Right lower leg (including foot);Left lower leg (including foot) Bathing: 7: Complete Independence: No helper  FIM - Upper Body Dressing/Undressing Upper body dressing/undressing steps patient completed: Thread/unthread right sleeve of pullover shirt/dresss;Thread/unthread left sleeve of pullover shirt/dress;Put head through opening of pull over shirt/dress;Pull shirt over trunk Upper body dressing/undressing: 7: Complete Independence: No helper FIM - Lower Body Dressing/Undressing Lower body dressing/undressing steps patient completed: Thread/unthread right pants leg;Thread/unthread left pants leg;Pull pants up/down;Fasten/unfasten pants;Don/Doff right sock;Don/Doff left sock;Don/Doff right shoe;Don/Doff left shoe (wears sandals) Lower body dressing/undressing: 7: Complete Independence: No helper  FIM - Toileting Toileting steps completed by patient: Adjust clothing prior to toileting;Performs perineal hygiene;Adjust clothing after toileting Toileting: 7: Independent: No helper, no device  FIM - Diplomatic Services operational officer Devices: Grab bars Toilet Transfers: 7-Independent: No helper  FIM - Games developer Transfer: 7: Supine > Sit: No assist;7: Sit > Supine: No assist  FIM - Locomotion: Wheelchair Distance: 150' Locomotion: Wheelchair: 0: Activity did not occur FIM - Locomotion: Ambulation Locomotion: Ambulation Assistive Devices: Other (comment) (none) Ambulation/Gait Assistance: 5: Supervision Locomotion: Ambulation: 5: Travels 150 ft or more with supervision/safety issues  Comprehension Comprehension Mode:  Auditory Comprehension: 4-Understands basic 75 - 89% of the time/requires cueing 10 - 24% of the time  Expression Expression Mode: Verbal Expression: 4-Expresses basic 75 - 89% of the time/requires cueing 10 - 24% of  the time. Needs helper to occlude trach/needs to repeat words.  Social Interaction Social Interaction: 5-Interacts appropriately 90% of the time - Needs monitoring or encouragement for participation or interaction.  Problem Solving Problem Solving: 3-Solves basic 50 - 74% of the time/requires cueing 25 - 49% of the time  Memory Memory: 3-Recognizes or recalls 50 - 74% of the time/requires cueing 25 - 49% of the time  Medical Problem List and Plan:  1. DVT Prophylaxis/Anticoagulation: Mechanical: Sequential compression devices, below knee Bilateral lower extremities  2. Headaches/Pain Management: Will continue prn medications for headaches.   -added qhs topamax which has helped 3. Mood: No signs of distress. Will have LCSW follow for evaluation.  4. Neuropsych: This patient is not capable of making decisions on his own behalf.  5. Seizure prophylaxis: Keppra and nimotop completed 6. ABLA : hgb 12.6   LOS (Days) 7 A FACE TO FACE EVALUATION WAS PERFORMED  Andranik Jeune T 11/03/2012 7:55 AM

## 2012-11-04 ENCOUNTER — Inpatient Hospital Stay (HOSPITAL_COMMUNITY): Payer: Self-pay

## 2012-11-04 ENCOUNTER — Inpatient Hospital Stay (HOSPITAL_COMMUNITY): Payer: Medicaid Other

## 2012-11-04 ENCOUNTER — Inpatient Hospital Stay (HOSPITAL_COMMUNITY): Payer: Medicaid Other | Admitting: Speech Pathology

## 2012-11-04 DIAGNOSIS — I609 Nontraumatic subarachnoid hemorrhage, unspecified: Secondary | ICD-10-CM

## 2012-11-04 DIAGNOSIS — I10 Essential (primary) hypertension: Secondary | ICD-10-CM

## 2012-11-04 NOTE — Progress Notes (Signed)
Met with pt's wife in conference room and had a very direct conversation/education session in regards to pt's current cognitive deficits and their impact on his overall safety and independence. [name] wife also provided strategies to utilize at home to increase his overall cognitive function in regards to working memory, problem solving, awareness, attention an safety. [name] wife very receptive to information and asking appropriate questions. This clinician also stressed the fact that the treatment team highly recommends 24 hour supervision at discharge, however, pt's wife reported she is unable to provide that at this time but has been thinking of ways to maximize his safety at home while he is alone. [name] wife reported their family only has one car that she uses to get to work and that she plans on having her uncle "check in" on pt once per day and "take him out." She also reported she plans on pre making all of his meals and calling to check in on him every hour. [name] wife also provided pill box to increase accuracy and recall with medication management in which she will oversee at this time. Pt verbalized understanding of all information and handout given to reinforce all information.   Feliberto Gottron, [place], CCC-SLP [phone]

## 2012-11-04 NOTE — Progress Notes (Deleted)
Physical Therapy Session Note  Patient Details  Name: Richard Davenport MRN: [id number] Date of Birth: [birth year]  Today's Date: 11/04/2012 Time: Treatment Session 1: 1100-1155; Treatment Session 2: 4098-1191 Time Calculation (min): Treatment Session 1: 55 min; Treatment Session 2:  Short Term Goals: Week 1:  PT Short Term Goal 1 (Week 1): STGs=LTGs  Skilled Therapeutic Interventions/Progress Updates:  Treatment Session 1:  1:1. Pt received supine in bed, able to t/f to sitting independently. Focus this session on ambulation in outside community environment, pathfinding, sequencing, problem solving and memory. Pt able to amb >1000' overall this tx session, part of which was outside and included negotiation of multiple stairs (single rail, reciprocal pattern) as well as crossing semi-busy street. Pt req supervision overall, however, min-mod verbal cues when crossing street for safety. Pt able to verbalize need to look both ways, but began to walk as car approached, pt not going to wait for it to stop before crossing. Pt able to pathfind way back to unit as well as room multiple times throughout session w/ mod verbal cues. In therapy kitchen, pt asked to verbalized things needed to set table. Pt demonstrating word finding issues regarding items needed, also used gestures to identify items. Pt able to locate all items in kitchen successful to set table w/ mod verbal cues. Focus at end of session on amb around unit while tossing letter ball between therapist and pt. Pt instructed to name food (1st round), animal (2nd round), color (3rd round) starting w/ the letter that L hand landed on when holding the ball. Pt req significantly increased time, need to stop or mod verbal cues to come up with answer. While pt was thinking demonstration of decreased amb speed and deviation to R. Pt sitting EOB w/ lunch tray at end of session, all needs in reach.   Treatment Session 2:  1:1. Pt semi-reclined in bed upon  entry, family member Naveed in room. Focus this session on family education regarding pt's cognitive impairments and ways that family can facilitate healing and safety in home environment. Handout (multiple copies) provided to pt and family member that provides written education of all information verbally reviewed. Pt very receptive to education, agreeing with identified cognitive impairments as well as actively participating in ways to encourage safety and cognitive healing once home. Also stressed, therapists' strong recommendation for 24/7 supervision to maximize safety due to cognitive impairments despite being physically able to perform ADL's and mobility w/out help. Although pt's family member present, did not appear very receptive to information provided as hand out was rolling up in his hand entire time despite therapist's continuous physical and verbal reference to points on sheet. Additional written handout left with pt to provide his wife as she was unable to attend the education session. Encouraged pt to have wife leave a note regarding any concerns in relation to helping pt at home. Pt semi-reclined in bed at end of session w/ all needs in reach.   Therapy Documentation Precautions:  Precautions Precautions: Fall Restrictions Weight Bearing Restrictions: No   Pain: Pain Assessment Pain Assessment: No/denies pain  See FIM for current functional status  Therapy/Group: Individual Therapy  Denzil Hughes 11/04/2012, 12:02 PM

## 2012-11-04 NOTE — Progress Notes (Addendum)
Social Work Patient ID: Richard Davenport, male   DOB: Jan 17, 1961, 52 y.o.   MRN: [id number]  Met with patient and wife yesterday afternoon to review team conference.  Explained to both that team feels pt is ready for hospital d/c on Thursday, however, given pt's cognitive deficits, it is recommended that he have 24/7 supervision.  Wife with basic understanding and awareness of the cognitive issues outside of normal, functional activities.  She does NOT have a plan for providing 24/7 supervision.  She reports that pt's "uncle" (in some notes referred to as "nephew" or "cousin"), Naveed, is not able to stay with patient for the time that she is at work (M-F 7:30 a - 5:00p).  She notes she must continue to work as she is the only wage earner in the home.  Explained to her that I do not have any options to offer her that would provide 24/7 supervision other than a rest home (do not feel he would qualify for SNF).  Encouraged her to think "outside of the box" and consider anyone who might be able to come to home or allow pt to stay with them while she works.  She reports she knows of no one.  She becomes slightly tearful during this discussion and asks if the SW can direct her to any financial assistance programs that might help her with monthly expenses.  Explained to her that I will provide her with list of programs that might be available to her via DSS and that pt should easily qualify for MAF (medicaid for families) to cover his hospitalization.  Can also connect pt with "[employer] program.  Unfortunately, none of this addresses the most pressing need and that is for pt to have 24/7 supervision at home.  Asked her to speak further with Naveed to see if he has any other ideas.  Also stressed need for caregivers to complete family education.  Wife cannot be here for any family ed due to her work schedule unless later ed sessions (5 pm) can be arranged.  May potentially see Naveed here around 3:30 today for education.   Have also left messages for him to contact me to see if he might have any other people he could identify to assist with pt's care post d/c.  Will discuss further with team today.  Meeting with wife again this afternoon as well.  Continue to follow.  Marites Nath, LCSW   Met with wife's uncle this afternoon who was here to complete education with tx.  He clarifies that he had hoped his "grandmother" would be able to assist with pt's supervision, however, she is now sick.  He can "check in" on pt 1-2 times per day, however, cannot stay with pt while wife at work.  He and wife are aware that team recommends 24/7 supervision or pt is at risk for injury due to cognnitive impairments.  Pt and family all refuse option of RHP.  Will proceed with d/c home tomorrow as team does not feel this is a matter of the situation/ recommendations to have changed with a few days of extension.  Wife and uncle to "try and see if others can stop by" during the day.  Unfortunately, this is not a situation with an optimal outcome but pt and family have the right to decide to take risk and d/c home vs. Transfer to care facility.  Will work on arranging [facility] visits including [facility]HSW to assess environment, however, they will be limited due  to Medicaid restrictions.  No DME needs.  Odysseus Cada, LCSW

## 2012-11-04 NOTE — Progress Notes (Signed)
Subjective/Complaints: Slept well. No am complaints today.  A 12 point review of systems has been performed and if not noted above is otherwise negative.   Objective: Vital Signs: Blood pressure 100/65, pulse 94, temperature 98.5 F (36.9 C), temperature source Oral, resp. rate 17, weight 80.2 kg (176 lb 12.9 oz), SpO2 97.00%. No results found. No results found for this basename: WBC, HGB, HCT, PLT,  in the last 72 hours No results found for this basename: NA, K, CL, CO, GLUCOSE, BUN, CREATININE, CALCIUM,  in the last 72 hours CBG (last 3)  No results found for this basename: GLUCAP,  in the last 72 hours  Wt Readings from Last 3 Encounters:  11/03/12 80.2 kg (176 lb 12.9 oz)  10/18/12 92.7 kg (204 lb 5.9 oz)  10/18/12 92.7 kg (204 lb 5.9 oz)    Physical Exam:  Nursing note and vitals reviewed.  Constitutional: He appears well-developed and well-nourished.  HENT:  Head: Normocephalic and atraumatic.  Eyes: Conjunctivae are normal. Pupils are equal, round, and reactive to light.  Neck: Normal range of motion. Neck supple.  Cardiovascular: Normal rate and regular rhythm.  No murmur heard.  Pulmonary/Chest: Effort normal and breath sounds normal. No respiratory distress. He has no wheezes.  Abdominal: Soft. Bowel sounds are normal. He exhibits no distension. There is no tenderness.  Musculoskeletal: He exhibits no edema and no tenderness.  Neurological: He is alert.  Oriented to self and place with cues.Marland Kitchen Speech clear and able to follow one and two step commands without difficulty. Strength remains symmetrical but generally 4+/5 proximal to distal, lower and upper. Carries conversation, fluid speech. Decreased insight and awareness Skin: Skin is warm and dry.  Psychiatric: He has a normal mood and affect. His speech is normal  .   Assessment/Plan: 1. Functional deficits secondary to SAH/IVH which require 3+ hours per day of interdisciplinary therapy in a comprehensive inpatient  rehab setting. Physiatrist is providing close team supervision and 24 hour management of active medical problems listed below. Physiatrist and rehab team continue to assess barriers to discharge/monitor patient progress toward functional and medical goals.  Family will need to be present at discharge for supervision due to poor memory and insight.    FIM: FIM - Bathing Bathing Steps Patient Completed: Chest;Right Arm;Left Arm;Abdomen;Front perineal area;Buttocks;Right upper leg;Left upper leg;Right lower leg (including foot);Left lower leg (including foot) Bathing: 7: Complete Independence: No helper  FIM - Upper Body Dressing/Undressing Upper body dressing/undressing steps patient completed: Thread/unthread right sleeve of pullover shirt/dresss;Thread/unthread left sleeve of pullover shirt/dress;Put head through opening of pull over shirt/dress;Pull shirt over trunk Upper body dressing/undressing: 7: Complete Independence: No helper FIM - Lower Body Dressing/Undressing Lower body dressing/undressing steps patient completed: Thread/unthread right pants leg;Thread/unthread left pants leg;Pull pants up/down;Fasten/unfasten pants;Don/Doff right sock;Don/Doff left sock;Don/Doff right shoe;Don/Doff left shoe (wears sandals) Lower body dressing/undressing: 7: Complete Independence: No helper  FIM - Toileting Toileting steps completed by patient: Adjust clothing prior to toileting;Performs perineal hygiene;Adjust clothing after toileting Toileting: 7: Independent: No helper, no device  FIM - Diplomatic Services operational officer Devices: Grab bars Toilet Transfers: 7-Independent: No helper  FIM - Games developer Transfer: 7: Supine > Sit: No assist;7: Sit > Supine: No assist  FIM - Locomotion: Wheelchair Distance: 150' Locomotion: Wheelchair: 0: Activity did not occur FIM - Locomotion: Ambulation Locomotion: Ambulation Assistive Devices: Other (comment)  (none) Ambulation/Gait Assistance: 5: Supervision Locomotion: Ambulation: 5: Travels 150 ft or more with supervision/safety issues  Comprehension Comprehension Mode: Auditory  Comprehension: 4-Understands basic 75 - 89% of the time/requires cueing 10 - 24% of the time  Expression Expression Mode: Verbal Expression: 4-Expresses basic 75 - 89% of the time/requires cueing 10 - 24% of the time. Needs helper to occlude trach/needs to repeat words.  Social Interaction Social Interaction: 5-Interacts appropriately 90% of the time - Needs monitoring or encouragement for participation or interaction.  Problem Solving Problem Solving: 3-Solves basic 50 - 74% of the time/requires cueing 25 - 49% of the time  Memory Memory: 3-Recognizes or recalls 50 - 74% of the time/requires cueing 25 - 49% of the time  Medical Problem List and Plan:  1. DVT Prophylaxis/Anticoagulation: Mechanical: Sequential compression devices, below knee Bilateral lower extremities  2. Headaches/Pain Management: Will continue prn medications for headaches.   -added qhs topamax which has helped 3. Mood: No signs of distress. Will have LCSW follow for evaluation.  4. Neuropsych: This patient is not capable of making decisions on his own behalf.  5. Seizure prophylaxis: Keppra and nimotop completed 6. ABLA : hgb 12.6   LOS (Days) 8 A FACE TO FACE EVALUATION WAS PERFORMED  Richard Davenport T 11/04/2012 7:17 AM

## 2012-11-04 NOTE — Discharge Summary (Signed)
Physician Discharge Summary  Patient ID: Richard Davenport MRN: [id number] DOB/AGE: 01/22/1960 52 y.o.  Admit date: 10/27/2012 Discharge date: 11/05/2012  Discharge Diagnoses:  Active Problems:   SAH (subarachnoid hemorrhage)   Discharged Condition: Improved  Significant Diagnostic Studies: N/A   Labs:  Basic Metabolic Panel:    Component Value Date/Time   NA 136 10/28/2012 0705   K 3.9 10/28/2012 0705   CL 101 10/28/2012 0705   CO2 27 10/28/2012 0705   GLUCOSE 90 10/28/2012 0705   BUN 13 10/28/2012 0705   CREATININE 0.97 10/28/2012 0705   CALCIUM 9.0 10/28/2012 0705   GFRNONAA >90 10/28/2012 0705   GFRAA >90 10/28/2012 0705     CBC:     Component Value Date/Time   WBC 6.8 10/28/2012 0705   RBC 3.69* 10/28/2012 0705   HGB 12.6* 10/28/2012 0705   HCT 35.9* 10/28/2012 0705   PLT 194 10/28/2012 0705   MCV 97.3 10/28/2012 0705   MCH 34.1* 10/28/2012 0705   MCHC 35.1 10/28/2012 0705   RDW 12.6 10/28/2012 0705   LYMPHSABS 1.9 10/28/2012 0705   MONOABS 0.4 10/28/2012 0705   EOSABS 0.3 10/28/2012 0705   BASOSABS 0.0 10/28/2012 0705     CBG:  Recent Labs Lab 10/30/12 1647 10/30/12 2208  GLUCAP 120* 133*    Brief HPI:   Richard Davenport is a 52 y.o. male admitted on 10/08/12 with unresponsiveness due to diffuse SAH in the basal cisterns and  bilateral sylvian fissures with some degree of intraventricular hemorrhage. 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 Conchita Paris. Hyponatremia treated with florinef as well as normal saline with improvement. Headaches as well as confusion is resolving. Follow up CCT on 10/2 showed continued resolution of SAH, no cortical infarct or hydrocephalus.  He continued to be limited by headaches, dizziness, cognitive and balance deficits therefore CIR was recommended for progression   Hospital Course: Richard Davenport was admitted to rehab 10/27/2012 for inpatient  therapies to consist of PT, ST and OT at least three hours five days a week. Past admission physiatrist, therapy team and rehab RN have worked together to provide customized collaborative inpatient rehab. Blood pressures have been well controlled. He continued to be limited by headaches and Topamax was added to help with symptoms. This in addition to prn use of oxycodone has been effective in pain management. He's continent of bowel and bladder and po intake has been good.   During patient's stay in rehab weekly team conferences were held to monitor patient's progress, set goals and discuss barriers to discharge. He continues to require moderate assist  for functional problem solving, working memory with utilization of compensatory strategies, divided attention, safety awareness as well as emergent awareness into deficits and its impact his overall function. He's independent for bathing and dressing tasks. He requires supervision with ambulation due to cognitive impairements.  Family education was done with wife regarding current deficits as well as need for 24 hours supervision.    Disposition:  Home  Diet: Regular  Special Instructions: 1. Needs 24 hours supervision.  2. No Driving.  3. May use hydrocodone once oxycodone is used up.        Future Appointments Provider Department Dept Phone   11/24/2012 11:40 AM Ranelle Oyster, MD [facility] Physical Medicine and Rehabilitation [phone]       Medication List    STOP taking these medications  feeding supplement (ENSURE COMPLETE) Liqd     HYDROcodone-acetaminophen 5-325 MG per tablet  Commonly known as:  NORCO/VICODIN      TAKE these medications       acetaminophen 325 MG tablet  Commonly known as:  TYLENOL  Take 1-2 tablets (325-650 mg total) by mouth every 4 (four) hours as needed.     oxyCODONE 5 MG immediate release tablet # 60 pills Rx  Commonly known as:  Oxy IR/ROXICODONE  Take 1-2 tablets (5-10 mg total) by  mouth every 6 (six) hours as needed.     senna-docusate 8.6-50 MG per tablet  Commonly known as:  Senokot-S  Take 1 tablet by mouth 2 (two) times daily. For constipation.     simvastatin 10 MG tablet  Commonly known as:  ZOCOR  Take 1 tablet (10 mg total) by mouth at bedtime.     topiramate 25 MG tablet  Commonly known as:  TOPAMAX  Take 1 tablet (25 mg total) by mouth at bedtime. Non-narcotic medication for headaches.       Follow-up Information   Follow up with Ranelle Oyster, MD On 11/24/2012. (Be there at 11:15 am  for 11:40 am  appointment)    Specialty:  Physical Medicine and Rehabilitation   Contact information:   [address], [address] [place] [address] [phone]       Follow up with Jackelyn Hoehn, MD. Call today. (for follow up in 2 weeks. )    Specialty:  Neurosurgery   Contact information:   [address] [place] [address] [phone]       Signed: Jacquelynn Cree 11/06/2012, 2:30 PM

## 2012-11-04 NOTE — Progress Notes (Signed)
Occupational Therapy Session Note  Patient Details  Name: Develle Sievers MRN: [id number] Date of Birth: 1960/11/15  Today's Date: 11/04/2012 Time: 1400-1500 Time Calculation (min): 60 min  Short Term Goals: Week 1:  OT Short Term Goal 1 (Week 1): LTG=STG due to estimated brief LOS  Skilled Therapeutic Interventions/Progress Updates: Therapeutic activity with emphasis on cognitive remediation and improved intellectual/anticipatory awareness.   Patient was presented manual task of duplicating leather lacing samples presented to assess for ability to inspect and duplicate simple to complex patterns, develop a method or strategy to both detect and resolve a problem using both intellectual and manual skills, recognize and correct any errors in performance of task, and complete self-assessment of performance.   Patient completed 2 or 3 lacing stitches (running stitch and whip stitch) without error although forgetting direction to limit number of stitches duplicated during whip stitch by completing 7 stitches instead of 3.   Without prompts, using sample provided, patient completed 3 single cordovan stitches (complex) in 5 minutes although with no consistent strategy using trial and error to duplicate pattern.  Presented with question to describe how to complete stitch, patient was unable to return demonstration or continue pattern he had already completed.   Session concluded with review of his performance with activities presented during all therapy sessions during this admission and patient verbalized understanding of discharge recommendations relating to need for supervision at home, suspension of driving, delegation of complex tasks, and use of memory aids.  Patient reported that his family is aware of his need for assistance and supervision, although no family was present during this session.  At conclusion of session patient declared that he had no intention to risk injury to himself by leaving home,  unescorted, and he would ask family members to complete shopping tasks and assist him with purchases when in community.   Therapy Documentation Precautions:  Precautions Precautions: Fall Restrictions Weight Bearing Restrictions: No  See FIM for current functional status  Therapy/Group: Individual Therapy  Kyrstyn Greear 11/04/2012, 3:09 PM

## 2012-11-04 NOTE — Patient Care Conference (Signed)
Inpatient RehabilitationTeam Conference and Plan of Care Update Date: 11/03/2012   Time: 3:00 PM    Patient Name: Richard Davenport      Medical Record Number: [id number]  Date of Birth: 10-31-1960 Sex: Male         Room/Bed: 4W09C/4W09C-01 Payor Info: Payor: MEDICAID PENDING / Plan: MEDICAID PENDING / Product Type: *No Product type* /    Admitting Diagnosis: SAH  Admit Date/Time:  10/27/2012  5:34 PM Admission Comments: No comment available   Primary Diagnosis:  <principal problem not specified> Principal Problem: <principal problem not specified>  Patient Active Problem List   Diagnosis Date Noted  . Acute respiratory failure with hypoxia 10/10/2012  . SAH (subarachnoid hemorrhage) 10/09/2012  . Altered mental status 10/09/2012    Expected Discharge Date: Expected Discharge Date: 11/05/12  Team Members Present: Physician leading conference: Dr. Faith Rogue Social Worker Present: Amada Jupiter, LCSW Nurse Present: Daryll Brod, RN PT Present: Zerita Boers, PT OT Present: Mackie Pai, OT;Jennifer Katrinka Blazing, OT;Frank Brookside, OT SLP Present: Feliberto Gottron, SLP PPS Coordinator present : Edson Snowball, PT     Current Status/Progress Goal Weekly Team Focus  Medical   headaches better. poor memory and awareness of deficits  increase safety awareness, decrease pain and headaches  neurocognitive therapy to maximize safety and cogntiion   Bowel/Bladder   continent of B&B, LBM 10/30/12  Remain continent B&B  To remain continent B&B   Swallow/Nutrition/ Hydration             ADL's   Independent for ADL, supervision for iADL  Mod I - Supervision  Cognitive remediation, compensation   Mobility   (I) bed mob, basic transfers; (S) for remaining tasks due to cognitive impairments w/ mod-max verbal cues  Goals modified 2/2 cognition. Mod(I): dynamic sit/stand balance, bed mob, bed/furniture t/fs, amb in home environment; (S) for car t/f, amb in controlled and community environments,  stair negotiation, cognition.   Problem solving, pathfinding in community environments, higher level dynamic standing balance, memory   Communication   Supervision-Mod I   Mod I  comprehension of complex information    Safety/Cognition/ Behavioral Observations  Mod-Max A  Supervision, need to downgrade goals to Min-Mod A  attention, awareness, problem solving, working memory    Pain   C/O headache, Tylenoll 650mg  q4hrs prn   Pain <3  Offer pain med prior to therapy sessions   Skin   cranial incision healing well  No addl skin breakdown  No skin breakdown    Rehab Goals Patient on target to meet rehab goals: Yes *See Care Plan and progress notes for long and short-term goals.  Barriers to Discharge: cognition, safety    Possible Resolutions to Barriers:  supervision at home    Discharge Planning/Teaching Needs:  home with family to provide 24/7 supervision      Team Discussion:  Pt with significant cognitive impairments especially with self awareness of deficits and safety awareness.  Can "cover" these deficits very well in social situation.  Does fine with functional, familiar, daily activities.  Feel pt is approaching stability to transition home.  SW to follow up and confirm if family can provide 24/7 supervision (meeting with wife this afternooon.  Revisions to Treatment Plan:  None   Continued Need for Acute Rehabilitation Level of Care: The patient requires daily medical management by a physician with specialized training in physical medicine and rehabilitation for the following conditions: Daily direction of a multidisciplinary physical rehabilitation program to ensure safe treatment while eliciting  the highest outcome that is of practical value to the patient.: Yes Daily medical management of patient stability for increased activity during participation in an intensive rehabilitation regime.: Yes Daily analysis of laboratory values and/or radiology reports with any subsequent  need for medication adjustment of medical intervention for : Neurological problems;Other  Sosie Gato 11/04/2012, 11:35 AM

## 2012-11-04 NOTE — Progress Notes (Signed)
Speech Language Pathology Daily Session Note  Patient Details  Name: Richard Davenport MRN: [id number] Date of Birth: 02-15-1960  Today's Date: 11/04/2012  Session 1 Time: 4098-1191 Time Calculation (min): 25 min  Session 2 Time: 4782-9562 Time Calculation: 25 min   Short Term Goals: Week 1: SLP Short Term Goal 1 (Week 1): Pt will utilize call bell to express wants/needs with Mod I.  SLP Short Term Goal 2 (Week 1): Pt will demonstrate functional problem solving for complex tasks with supervision verbal and question cues.  SLP Short Term Goal 3 (Week 1): Pt will utilize external memory aids to recall new, daily information with supervision verbal and visual cues.  SLP Short Term Goal 4 (Week 1): Pt will demonstrate alternating attention between two tasks for 30 minutes with supervision verbal cues for redirection.  SLP Short Term Goal 5 (Week 1): Pt will follow multistep commands with 100% accuracy and Min verbal and question cues.   Skilled Therapeutic Interventions:  Session 1: Treatment focus on cognitive goals in regards to working memory and anticipatory awareness. SLP facilitated session by providing supervision cues for utilization of his schedule to recall next therapy session. Pt also participated in verbal problem solving with Min verbal and question cues to generate a list of tasks/activities he safely do at home to maximize safety.  Pt required Max A to recall current medications and their functions and was educated on utilization of a pill box at home to increase recall and accuracy with medication management.  A pill box was also provided.    Session 2: Treatment focus on pt/family education. [name] family was not present during the session, however, pt provided extensive verbal and visual education in regards to current cognitive function and strategies to utilize to maximize working memory, attention, problem solving and safety at home. Pt verbalized understanding, however, strongly  recommend 24 hour supervision at discharge to maximize safety due to cognitive deficits. Will re-attempt education with pt's wife later this evening if able.    FIM:  Comprehension Comprehension Mode: Auditory Comprehension: 4-Understands basic 75 - 89% of the time/requires cueing 10 - 24% of the time Expression Expression Mode: Verbal Expression: 4-Expresses basic 75 - 89% of the time/requires cueing 10 - 24% of the time. Needs helper to occlude trach/needs to repeat words. Social Interaction Social Interaction: 5-Interacts appropriately 90% of the time - Needs monitoring or encouragement for participation or interaction. Problem Solving Problem Solving: 3-Solves basic 50 - 74% of the time/requires cueing 25 - 49% of the time Memory Memory: 3-Recognizes or recalls 50 - 74% of the time/requires cueing 25 - 49% of the time  Pain Pain Assessment Pain Assessment: No/denies pain  Therapy/Group: Individual Therapy  Gjon Letarte 11/04/2012, 6:03 PM

## 2012-11-05 ENCOUNTER — Inpatient Hospital Stay (HOSPITAL_COMMUNITY): Payer: Self-pay

## 2012-11-05 ENCOUNTER — Inpatient Hospital Stay (HOSPITAL_COMMUNITY): Payer: Medicaid Other

## 2012-11-05 ENCOUNTER — Inpatient Hospital Stay (HOSPITAL_COMMUNITY): Payer: Medicaid Other | Admitting: Occupational Therapy

## 2012-11-05 ENCOUNTER — Inpatient Hospital Stay (HOSPITAL_COMMUNITY): Payer: Self-pay | Admitting: Speech Pathology

## 2012-11-05 MED ORDER — SENNOSIDES-DOCUSATE SODIUM 8.6-50 MG PO TABS: 1.0000 | ORAL_TABLET | Freq: Two times a day (BID) | ORAL | Status: DC

## 2012-11-05 MED ORDER — TOPIRAMATE 25 MG PO TABS: 25.0000 mg | ORAL_TABLET | Freq: Every day | ORAL | Status: DC

## 2012-11-05 MED ORDER — ACETAMINOPHEN 325 MG PO TABS: 325.0000 mg | ORAL_TABLET | ORAL | Status: DC | PRN

## 2012-11-05 MED ORDER — OXYCODONE HCL 5 MG PO TABS: 5.0000 mg | ORAL_TABLET | Freq: Four times a day (QID) | ORAL | Status: DC | PRN

## 2012-11-05 NOTE — Progress Notes (Signed)
Occupational Therapy Session Note  Patient Details  Name: Demitrius Crass MRN: [id number] Date of Birth: 11-02-1960  Today's Date: 11/05/2012 Time: 2595-6387 Time Calculation (min): 43 min  Short Term Goals: Week 1:  OT Short Term Goal 1 (Week 1): LTG=STG due to estimated brief LOS  Skilled Therapeutic Interventions/Progress Updates: Therapeutic activity with emphasis on compensatory strategies to improve LT and ST recall of events, past, current and future using yearly planner and patient's phone calendar.   Patient was instructed on entry method and advantages of yearly planner to use as memory aid for historical events and as a planner to record future events.   To improve daily recall, patient was instructed on use of his calendar function (with ringtone) for his cell phone.   Patient stated he appreciated yearly planner as similar to one he uses for work.   Patient demonstrated ability to record one event in his cell phone with supervision but stated he would have his son help him with this task as well.    Therapy Documentation Precautions:  Precautions Precautions: Fall Restrictions Weight Bearing Restrictions: No   Pain: Pain Assessment Pain Assessment: No/denies pain  See FIM for current functional status  Therapy/Group: Individual Therapy  Holton Sidman 11/05/2012, 12:18 PM

## 2012-11-05 NOTE — Progress Notes (Signed)
Occupational Therapy Discharge Summary  Patient Details  Name: Richard Davenport MRN: [id number] Date of Birth: [birth year]  Today's Date: 11/05/2012  Patient has met 5 of 5 long term goals due to ability to compensate for deficits and improved attention.  Patient to discharge at overall Supervision level.  Patient's care partner is independent to provide the necessary cognitive assistance at discharge to reduce risk for injury due to confusion, decreased memory or disorientation to place and situation when away from home.    Reasons goals not met: n/a  Recommendation:  Patient will benefit from ongoing skilled OT services in home health setting to continue to advance functional skills in the area of iADL.  Equipment: No equipment provided  Reasons for discharge: treatment goals met  Patient/family agrees with progress made and goals achieved: Yes  OT Discharge Precautions/Restrictions  Precautions Precautions: Fall Restrictions Weight Bearing Restrictions: No   Pain Pain Assessment Pain Assessment: No/denies pain  ADL ADL Eating: Independent Where Assessed-Eating: Edge of bed Where Assessed-Grooming: Standing at sink Upper Body Bathing: Independent Where Assessed-Upper Body Bathing: Shower Lower Body Bathing: Independent Where Assessed-Lower Body Bathing: Shower Upper Body Dressing: Independent Where Assessed-Upper Body Dressing: Edge of bed Lower Body Dressing: Independent Where Assessed-Lower Body Dressing: Edge of bed Where Assessed-Toileting: Teacher, adult education: Community education officer Method: Risk manager: Psychologist, counselling Method: Ambulating ADL Comments: provided IT sales professional (yearly) to compensate for memory deficits  Vision/Perception  Vision - History Baseline Vision: Wears glasses only for reading Patient Visual Report: No change from baseline Vision - Assessment Eye Alignment: Within Functional  Limits Perception Perception: Within Functional Limits Praxis Praxis: Intact   Cognition Overall Cognitive Status: Impaired/Different from baseline Arousal/Alertness: Awake/alert Orientation Level: Oriented X4 Attention: Selective Selective Attention: Appears intact Alternating Attention: Impaired Alternating Attention Impairment: Verbal complex;Functional complex Memory: Impaired Memory Impairment: Decreased recall of new information;Decreased short term memory Decreased Short Term Memory: Verbal basic;Functional basic Awareness: Impaired Awareness Impairment: Anticipatory impairment Problem Solving: Impaired Problem Solving Impairment: Verbal complex Executive Function: Organizing;Self Monitoring Reasoning: Impaired Reasoning Impairment: Verbal basic;Functional basic Organizing: Impaired Organizing Impairment: Functional complex Self Monitoring: Impaired Self Monitoring Impairment: Functional complex;Verbal complex Self Correcting: Impaired Self Correcting Impairment: Verbal complex;Functional complex Behaviors: Confabulation Safety/Judgment: Impaired Comments: Pt continues to demonstrate cognitive impairments, but slight improvement overall  Sensation Sensation Light Touch: Appears Intact Stereognosis: Appears Intact Hot/Cold: Appears Intact Proprioception: Appears Intact Coordination Gross Motor Movements are Fluid and Coordinated: Yes Fine Motor Movements are Fluid and Coordinated: Yes  Motor  Motor Motor: Within Functional Limits  Mobility  Bed Mobility Bed Mobility: Supine to Sit;Sit to Supine Supine to Sit: 7: Independent Sitting - Scoot to Edge of Bed: 7: Independent Sit to Supine: 7: Independent Transfers Transfers: Sit to Stand;Stand to Sit Sit to Stand: 7: Independent Stand to Sit: 7: Independent   Trunk/Postural Assessment  Cervical Assessment Cervical Assessment: Within Functional Limits Thoracic Assessment Thoracic Assessment: Within  Functional Limits Lumbar Assessment Lumbar Assessment: Within Functional Limits Postural Control Postural Control: Within Functional Limits   Balance Balance Balance Assessed: Yes Static Sitting Balance Static Sitting - Balance Support: No upper extremity supported;Feet supported Static Sitting - Level of Assistance: 7: Independent Dynamic Sitting Balance Dynamic Sitting - Balance Support: No upper extremity supported Dynamic Sitting - Level of Assistance: 7: Independent Static Standing Balance Static Standing - Balance Support: No upper extremity supported Static Standing - Level of Assistance: 7: Independent Dynamic Standing Balance Dynamic Standing - Balance Support: No upper  extremity supported;During functional activity Dynamic Standing - Level of Assistance: 7: Independent Dynamic Standing - Balance Activities: Forward lean/weight shifting;Lateral lean/weight shifting;Reaching across midline;Reaching for weighted objects;Reaching for objects  Extremity/Trunk Assessment RUE Assessment RUE Assessment: Within Functional Limits LUE Assessment LUE Assessment: Within Functional Limits  See FIM for current functional status  Bernell Sigal 11/05/2012, 6:51 PM

## 2012-11-05 NOTE — Progress Notes (Signed)
Subjective/Complaints: Slept well. Denies pain. Awakens easily. A 12 point review of systems has been performed and if not noted above is otherwise negative.   Objective: Vital Signs: Blood pressure 107/68, pulse 81, temperature 98 F (36.7 C), temperature source Oral, resp. rate 16, weight 80.241 kg (176 lb 14.4 oz), SpO2 93.00%. No results found. No results found for this basename: WBC, HGB, HCT, PLT,  in the last 72 hours No results found for this basename: NA, K, CL, CO, GLUCOSE, BUN, CREATININE, CALCIUM,  in the last 72 hours CBG (last 3)  No results found for this basename: GLUCAP,  in the last 72 hours  Wt Readings from Last 3 Encounters:  11/04/12 80.241 kg (176 lb 14.4 oz)  10/18/12 92.7 kg (204 lb 5.9 oz)  10/18/12 92.7 kg (204 lb 5.9 oz)    Physical Exam:  Nursing note and vitals reviewed.  Constitutional: He appears well-developed and well-nourished.  HENT:  Head: Normocephalic and atraumatic.  Eyes: Conjunctivae are normal. Pupils are equal, round, and reactive to light.  Neck: Normal range of motion. Neck supple.  Cardiovascular: Normal rate and regular rhythm.  No murmur heard.  Pulmonary/Chest: Effort normal and breath sounds normal. No respiratory distress. He has no wheezes.  Abdominal: Soft. Bowel sounds are normal. He exhibits no distension. There is no tenderness.  Musculoskeletal: He exhibits no edema and no tenderness.  Neurological: He is alert.  Oriented to self and place with cues. Speech clear and able to follow one and two step commands without difficulty. Strength remains symmetrical but generally 4+/5 proximal to distal, lower and upper. Carries conversation, fluid speech. Decreased insight and awareness Skin: Skin is warm and dry.  Psychiatric: He has a normal mood and affect. His speech is normal  .   Assessment/Plan: 1. 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 Physiatrist is providing close team supervision and 24 hour management of active medical problems listed below. Physiatrist and rehab team continue to assess barriers to discharge/monitor patient progress toward functional and medical goals.  Working on Conservator, museum/gallery. Needs supervision at home.   FIM: FIM - Bathing Bathing Steps Patient Completed: Chest;Right Arm;Left Arm;Abdomen;Front perineal area;Buttocks;Right upper leg;Left upper leg;Right lower leg (including foot);Left lower leg (including foot) Bathing: 7: Complete Independence: No helper  FIM - Upper Body Dressing/Undressing Upper body dressing/undressing steps patient completed: Thread/unthread right sleeve of pullover shirt/dresss;Thread/unthread left sleeve of pullover shirt/dress;Put head through opening of pull over shirt/dress;Pull shirt over trunk Upper body dressing/undressing: 7: Complete Independence: No helper FIM - Lower Body Dressing/Undressing Lower body dressing/undressing steps patient completed: Thread/unthread right pants leg;Thread/unthread left pants leg;Pull pants up/down;Fasten/unfasten pants;Don/Doff right sock;Don/Doff left sock;Don/Doff right shoe;Don/Doff left shoe (wears sandals) Lower body dressing/undressing: 7: Complete Independence: No helper  FIM - Toileting Toileting steps completed by patient: Adjust clothing prior to toileting;Performs perineal hygiene;Adjust clothing after toileting Toileting: 7: Independent: No helper, no device  FIM - Diplomatic Services operational officer Devices: Grab bars Toilet Transfers: 7-Independent: No helper  FIM - Games developer Transfer: 7: Supine > Sit: No assist;7: Sit > Supine: No assist  FIM - Locomotion: Wheelchair Distance: 150' Locomotion: Wheelchair: 0: Activity did not occur FIM - Locomotion: Ambulation Locomotion: Ambulation Assistive Devices: Other (comment) (none) Ambulation/Gait Assistance: 5:  Supervision Locomotion: Ambulation: 5: Travels 150 ft or more with supervision/safety issues  Comprehension Comprehension Mode: Auditory Comprehension: 4-Understands basic 75 - 89% of the time/requires cueing  10 - 24% of the time  Expression Expression Mode: Verbal Expression: 4-Expresses basic 75 - 89% of the time/requires cueing 10 - 24% of the time. Needs helper to occlude trach/needs to repeat words.  Social Interaction Social Interaction: 5-Interacts appropriately 90% of the time - Needs monitoring or encouragement for participation or interaction.  Problem Solving Problem Solving: 3-Solves basic 50 - 74% of the time/requires cueing 25 - 49% of the time  Memory Memory: 3-Recognizes or recalls 50 - 74% of the time/requires cueing 25 - 49% of the time  Medical Problem List and Plan:  1. DVT Prophylaxis/Anticoagulation: Mechanical: Sequential compression devices, below knee Bilateral lower extremities  2. Headaches/Pain Management: Will continue prn medications for headaches.   -added qhs topamax which has helped 3. Mood: No signs of distress. Will have LCSW follow for evaluation.  4. Neuropsych: This patient is not capable of making decisions on his own behalf.  5. Seizure prophylaxis: Keppra and nimotop completed 6. ABLA : hgb 12.6   LOS (Days) 9 A FACE TO FACE EVALUATION WAS PERFORMED  Nivea Wojdyla T 11/05/2012 7:54 AM

## 2012-11-05 NOTE — Progress Notes (Signed)
Occupational Therapy Session Note  Patient Details  Name: Richard Davenport MRN: [id number] Date of Birth: [birth year]  Today's Date: 11/05/2012 Time: 4098-1191 Time Calculation (min): 30 min  Short Term Goals: Week 1:  OT Short Term Goal 1 (Week 1): LTG=STG due to estimated brief LOS  Skilled Therapeutic Interventions/Progress Updates:  1:1 tx. No pain pre/post tx. Pt in bed on phone upon entering room. Pt demo'd (I) w/amb and dynamic standing balance in room reaching for items off floor w/o LOB. Pt stated he was to d/c home later afternoon w/wife. Pt participated in cognitive memory task w/only min cues. Reviewed cognitive and memory strategies with wife. Wife verbalized understanding. Pt cleared for d/c home w/wife.   Therapy Documentation Precautions:  Precautions Precautions: Fall Restrictions Weight Bearing Restrictions: No  See FIM for current functional status  Therapy/Group: Individual Therapy  Benton Tooker, Deidre Ala 11/05/2012, 3:41 PM

## 2012-11-05 NOTE — Progress Notes (Signed)
Speech Language Pathology Session Note & Discharge Summary  Patient Details  Name: Richard Davenport MRN: [id number] Date of Birth: 1960-03-04  Today's Date: 11/05/2012 Time: 1020-1100 Time Calculation (min): 40 min  Skilled Therapeutic Intervention: Treatment focus on continued education for discharge home today. SLP facilitated session by providing supervision verbal and question cues for planning a schedule/routine to follow at home to maximize safety and working memory. [name] schedule transferred onto printed schedule and extra copies provided to pt.   Patient has met 5 of 5 long term goals.  Patient to discharge at overall Mod level.   Reasons goals not met: N/A   Clinical Impression/Discharge Summary: Pt has made functional gains and has met 5 of 5 LTG's this admission. Currently, pt requires overall Mod A for functional problem solving, working memory with utilization of compensatory strategies, divided attention, safety awareness and emergent awareness into deficits and the overall impact they have on his function. Pt/family education complete. Recommend 24 hour supervision to maximize safety due to cognitive impairments despite being Mod I with ambulation. Both the pt and family are aware of recommendation but unable to provide the necessary assistance at this time, however, family appears to have multiple systems in place to maximize safety as much as possible at home. Recommend f/u SLP services to maximize cognitive function and overall functional independence.   Care Partner:  Caregiver Able to Provide Assistance:  (Education provided, See Note)  Type of Caregiver Assistance: Cognitive  Recommendation:  Home Health SLP  Rationale for SLP Follow Up: Maximize cognitive function and independence;Reduce caregiver burden   Equipment: N/A   Reasons for discharge: Treatment goals met;Discharged from hospital   Patient/Family Agrees with Progress Made and Goals Achieved: Yes   See FIM for  current functional status  Richard Davenport 11/05/2012, 4:14 PM

## 2012-11-05 NOTE — Progress Notes (Signed)
Social Work  Discharge Note  The overall goal for the admission was met for:   Discharge location: Yes - home with wife and other family member providing intermittent assist (please see both SW and ST notes regarding d/c recommendation and family discussions on 11/04/12)  Length of Stay: Yes - 9 days  Discharge activity level: Yes - supervision overall  Home/community participation: Yes  Services provided included: MD, RD, PT, OT, SLP, RN, Pharmacy, Neuropsych and SW  Financial Services: Other: MAF (Medicaid) application in process and anticipate approval within the month  Follow-up services arranged: Home Health: [facility] via [facility], Other: referred to [facility] program for medication assistance and Patient/Family has no preference for HH/DME agencies  Comments (or additional information):    Patient/Family verbalized understanding of follow-up arrangements: Yes  Individual responsible for coordination of the follow-up plan: patient and wife  Confirmed correct DME delivered:     Milynn Quirion, Valentina Gu

## 2012-11-05 NOTE — Progress Notes (Addendum)
Physical Therapy Discharge Summary  Patient Details  Name: Richard Davenport MRN: [id number] Date of Birth: Apr 06, 1960  Today's Date: 11/05/2012 Time:0830-0930 Time Calculation (min): 60 min  Patient has met 12 of 12 long term goals due to improved balance, increased strength, improved attention, improved awareness and improved coordination.  Patient to discharge at an ambulatory level with Supervision. Patient's care partner is independent to provide the necessary physical and cognitive assistance at discharge.  Reasons goals not met: N/A, all LTGs met.   Recommendation:  Patient does not require follow up physical therapy due high level of functional mobility achieved. Although pt able to perform functional mobility in home environment at [facility]) level, strong recommendation of 24 hour supervision to maximize safety due to cognitive impairments in home and community environments. Both pt and family are aware of recommendation, but unable to provide the necessary assistance at this time. However, family appears to have multiple systems in place to maximize safety as much as possible at home.   Equipment: No equipment provided  Reasons for discharge: treatment goals met  Patient/family agrees with progress made and goals achieved: Yes  Skilled Therapeutic Interventions 1:1. Pt received supine in bed, ready for therapy. Focus this session on creation of daily schedule to structure pt's day w/ incorporation of appropriate cognitive activities/challenges, pathfinding task off unit as well as performance of discharge assessment (see detailed objective information below). Overall, pt req min-mod verbal cues for pathfinding task in hospital. Pt demonstrating decreased need for verbal assist w/ finding and interpreting signs as well as increased reliance on visual memory of locations/paths. Pt able to amb >800' this session w/ no LOB, complete floor t/f and negotiate up/down 12 steps reciprocally w/ single  rail and consistent supervision. Pt cleared for mod(I) in room, RN aware and verbalized that wander guard in place to ensure pt safety on unit. Pt sitting in room at end of session w/ all needs in reach.   PT Discharge Precautions/Restrictions Precautions Precautions: Fall Restrictions Weight Bearing Restrictions: No   Pain Pain Assessment Pain Assessment: No/denies pain Vision/Perception  Vision - History Baseline Vision: Wears glasses only for reading Patient Visual Report: No change from baseline Perception Perception: Within Functional Limits Praxis Praxis: Intact  Cognition Overall Cognitive Status: Impaired/Different from baseline Arousal/Alertness: Awake/alert Orientation Level: Oriented X4 Selective Attention: Appears intact Alternating Attention: Impaired Alternating Attention Impairment: Verbal complex;Functional complex Memory: Impaired Memory Impairment: Decreased recall of new information;Decreased short term memory Decreased Short Term Memory: Functional complex;Verbal complex Awareness: Impaired Awareness Impairment: Anticipatory impairment Problem Solving: Impaired Problem Solving Impairment: Verbal complex;Functional complex Executive Function: Self Correcting;Reasoning Reasoning: Impaired Reasoning Impairment: Verbal complex;Functional complex Self Monitoring: Impaired Self Monitoring Impairment: Verbal complex;Functional complex Self Correcting: Impaired Self Correcting Impairment: Functional complex;Verbal complex Safety/Judgment: Impaired Comments: Pt continues to demonstrate cognitive impairments, but slight improvement overall Sensation Sensation Light Touch: Appears Intact Proprioception: Appears Intact Coordination Gross Motor Movements are Fluid and Coordinated: Yes Heel Shin Test: decreased speed and accuracy when using R LE Motor  Motor Motor: Within Functional Limits  Mobility Bed Mobility Bed Mobility: Supine to Sit;Sit to  Supine Supine to Sit: 7: Independent Sit to Supine: 7: Independent Transfers Transfers: Yes Sit to Stand: 7: Independent Stand to Sit: 7: Independent Stand Pivot Transfers: 7: Independent Locomotion  Ambulation Ambulation: Yes Ambulation/Gait Assistance: 5: Supervision;7: Independent Assistive device: None Ambulation/Gait Assistance Details: Pt independent for amb in home environment, req supervision when amb in community 2/2 cognitive impairments Gait Gait: Yes Gait Pattern: Within Functional  Limits Gait velocity: Occasional mild LOB when in busy environment when performing higher level cognitive tasks; able to self-correct Stairs / Additional Locomotion Stairs: Yes Stairs Assistance: 5: Supervision Stair Management Technique: One rail Right;Alternating pattern Number of Stairs: 12 Height of Stairs: 6.5 Wheelchair Mobility Wheelchair Mobility: No (Ambulation is pt's primary means of mobility)  Trunk/Postural Assessment  Cervical Assessment Cervical Assessment: Within Functional Limits Thoracic Assessment Thoracic Assessment: Within Functional Limits Lumbar Assessment Lumbar Assessment: Within Functional Limits Postural Control Postural Control: Within Functional Limits  Balance Balance Balance Assessed: Yes Static Sitting Balance Static Sitting - Balance Support: No upper extremity supported;Feet supported Static Sitting - Level of Assistance: 7: Independent Dynamic Sitting Balance Dynamic Sitting - Balance Support: No upper extremity supported Dynamic Sitting - Level of Assistance: 7: Independent Dynamic Sitting - Balance Activities: Lateral lean/weight shifting;Forward lean/weight shifting;Reaching for weighted objects;Reaching across midline;Reaching for objects Static Standing Balance Static Standing - Balance Support: No upper extremity supported Static Standing - Level of Assistance: 7: Independent Dynamic Standing Balance Dynamic Standing - Balance Support: No  upper extremity supported;During functional activity Dynamic Standing - Level of Assistance: 7: Independent Dynamic Standing - Balance Activities: Forward lean/weight shifting;Lateral lean/weight shifting;Reaching across midline;Reaching for weighted objects;Reaching for objects Extremity Assessment      RLE Assessment RLE Assessment: Within Functional Limits RLE Strength RLE Overall Strength Comments: Grossly 4+/5 LLE Assessment LLE Assessment: Within Functional Limits LLE Strength LLE Overall Strength Comments: Grossly 5/5  See FIM for current functional status  Denzil Hughes 11/05/2012, 12:18 PM

## 2012-11-11 ENCOUNTER — Other Ambulatory Visit (HOSPITAL_COMMUNITY): Payer: Self-pay | Admitting: Neurosurgery

## 2012-11-11 DIAGNOSIS — I729 Aneurysm of unspecified site: Secondary | ICD-10-CM

## 2012-11-24 ENCOUNTER — Encounter: Payer: Self-pay | Admitting: Physical Medicine & Rehabilitation

## 2012-11-24 ENCOUNTER — Encounter: Payer: Medicaid Other | Attending: Physical Medicine & Rehabilitation | Admitting: Physical Medicine & Rehabilitation

## 2012-11-24 VITALS — BP 144/87 | HR 88 | Resp 14 | Ht 68.0 in | Wt 173.0 lb

## 2012-11-24 DIAGNOSIS — I69919 Unspecified symptoms and signs involving cognitive functions following unspecified cerebrovascular disease: Secondary | ICD-10-CM | POA: Insufficient documentation

## 2012-11-24 DIAGNOSIS — I609 Nontraumatic subarachnoid hemorrhage, unspecified: Secondary | ICD-10-CM

## 2012-11-24 DIAGNOSIS — R4182 Altered mental status, unspecified: Secondary | ICD-10-CM

## 2012-11-24 DIAGNOSIS — J9601 Acute respiratory failure with hypoxia: Secondary | ICD-10-CM

## 2012-11-24 DIAGNOSIS — R51 Headache: Secondary | ICD-10-CM | POA: Insufficient documentation

## 2012-11-24 DIAGNOSIS — J9600 Acute respiratory failure, unspecified whether with hypoxia or hypercapnia: Secondary | ICD-10-CM

## 2012-11-24 NOTE — Progress Notes (Signed)
Subjective:    Patient ID: Richard Davenport, male    DOB: Jan 20, 1961, 52 y.o.   MRN: [id number]  HPI  This is the first follow up visit for Richard Davenport since he left [facility] after his ICH/IVH. He went home with [facility] services including RN, PT, and SLP. Speech therapy worked her with one time and signed off as did PT.   He is having occasional headaches when he goes out in the cold. He denies them otherwise. He is not reporting fatigue. He is sleeping 8 hours per night. He also will take a 30-60 minute nap during the day.  He reports no problems with balance. Vision is normal. Appetite is good. He denies issues with his bowels or bladder.  From a cognitive standpoint his long term memory is normal. He still has mild STM deficits by his own accounts. He denies any day to day problems with memory. No safety issues are reported.   He works as a used [employer]ar[employer] manager. He worked at this location since last December. He tells me his manager is ready for him to come back to work when his "doctor says he can."       Pain Inventory Average Pain 4-6 for headache Pain Right Now 0 My pain is intermittent  In the last 24 hours, has pain interfered with the following? General activity 0 Relation with others 0 Enjoyment of life 0 What TIME of day is your pain at its worst? n/a Sleep (in general) Fair  Pain is worse with: n/a Pain improves with: n/a Relief from Meds: 10 but only taken medication a couple times  Mobility walk without assistance ability to climb steps?  yes do you drive?  no  Function not employed: date last employed .  Neuro/Psych No problems in this area  Prior Studies Any changes since last visit?  no  Physicians involved in your care Any changes since last visit?  no   History reviewed. No pertinent family history. History   Social History  . Marital Status: Married    Spouse Name: N/A    Number of Children: N/A  . Years of Education: N/A   Social History Main  Topics  . Smoking status: Never Smoker   . Smokeless tobacco: Never Used  . Alcohol Use: No  . Drug Use: No  . Sexual Activity: None   Other Topics Concern  . None   Social History Narrative  . None   Past Surgical History  Procedure Laterality Date  . Radiology with anesthesia N/A 10/09/2012    Procedure: RADIOLOGY WITH ANESTHESIA;  Surgeon: Lisbeth Renshaw, MD;  Location: [facility] OR;  Service: Radiology;  Laterality: N/A;   Past Medical History  Diagnosis Date  . Headache    BP 144/87  Pulse 88  Resp 14  Ht 5\' 8"  (1.727 m)  Wt 173 lb (78.472 kg)  BMI 26.31 kg/m2  SpO2 98%     Review of Systems  Neurological: Positive for headaches.  All other systems reviewed and are negative.       Objective:   Physical Exam  Constitutional: He appears well-developed and well-nourished.  HENT:  Head: Normocephalic and atraumatic.  Eyes: Conjunctivae are normal. Pupils are equal, round, and reactive to light.  Neck: Normal range of motion. Neck supple.  Cardiovascular: Normal rate and regular rhythm.  No murmur heard.  Pulmonary/Chest: Effort normal and breath sounds normal. No respiratory distress. He has no wheezes.  Abdominal: Soft. Bowel sounds are normal. He  exhibits no distension. There is no tenderness.  Musculoskeletal: He exhibits no edema and no tenderness.  Neurological: He is alert.  Oriented to self and place. Needed cues for month (said October initially). He recalled 0/3 words after 5 minutes. He needed help with 1 or 3 serial 7's. Occasionally needed help sequencing numbers. Did beter with words/letters. Followed 3 tasks in sequence after begin asked. Motor and sensory skills are normal. CN normal.  Skin: Skin is warm and dry.  Psychiatric: He has a normal mood and affect. His speech is normal .    Assessment/Plan:  1. 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 He has improved a great deal. He still has some short term memory issues and difficulties with  processing information. I do think he is functional enough where he could attempt work.  I gave him the following instructions:  WORK:  YOU MAY RESUME WORK AS OF 11/30/12. I WOULD REQUEST THAT YOU WORK 4 HOUR DAYS INITIALLY FOR THE FIRST WEEK THEN INCREASE TO FULL TIME AS TOLERATED. I ALSO RECOMMEND THAT YOU HAVE SOMEBODY TO SHADOW YOU FOR THE FIRST 1-2 WEEKS TO INSURE YOUR ACCURACY AND ATTENTION TO DETAIL.  DRIVING: YOU MAY DRIVE AFTER THREE TRIAL RUNS WITH FAMILY. THE FIRST TRIAL SHOULD BE IN AN EMPTY PARKING LOT. IF SUCCESSFUL, YOU MAY TRY DRIVING WITH SUPERVISION LOCALLY IN TRAFFIC. IF THIS GOES WELL ON 2 ATTEMPTS, YOU MAY THEN DRIVE LOCALLY DURING THE DAY THEREAFTER.   2. Headaches/Pain Management: continue tylenol for breakthrough pain. He is off the topamax. May use oxycodone for severe headache although, I would like him to move away from this. .   I will see him back PRN. 30 minutes of face to face patient care time were spent during this visit. All questions were encouraged and answered.

## 2012-11-24 NOTE — Patient Instructions (Signed)
WORK:  YOU MAY RESUME WORK AS OF 11/30/12. I WOULD REQUEST THAT YOU WORK 4 HOUR DAYS INITIALLY FOR THE FIRST WEEK THEN INCREASE TO FULL TIME AS TOLERATED. I ALSO RECOMMEND THAT YOU HAVE SOMEBODY TO SHADOW YOU FOR THE FIRST 1-2 WEEKS TO INSURE YOUR ACCURACY AND ATTENTION TO DETAIL.  DRIVING: YOU MAY DRIVE AFTER THREE TRIAL RUNS WITH FAMILY. THE FIRST TRIAL SHOULD BE IN AN EMPTY PARKING LOT. IF SUCCESSFUL, YOU MAY TRY DRIVING WITH SUPERVISION LOCALLY IN TRAFFIC. IF THIS GOES WELL ON 2 ATTEMPTS, YOU MAY THEN DRIVE LOCALLY DURING THE DAY THEREAFTER.

## 2013-02-12 ENCOUNTER — Other Ambulatory Visit: Payer: Self-pay | Admitting: Neurosurgery

## 2013-04-02 ENCOUNTER — Encounter (HOSPITAL_COMMUNITY): Payer: Self-pay | Admitting: Pharmacy Technician

## 2013-04-05 ENCOUNTER — Other Ambulatory Visit (HOSPITAL_COMMUNITY): Payer: Self-pay | Admitting: Neurosurgery

## 2013-04-05 ENCOUNTER — Ambulatory Visit (HOSPITAL_COMMUNITY)
Admission: RE | Admit: 2013-04-05 | Discharge: 2013-04-05 | Disposition: A | Discharge status: Home / Self Care | Payer: Medicaid Other | Source: Ambulatory Visit | Attending: Neurosurgery | Admitting: Neurosurgery

## 2013-04-05 DIAGNOSIS — I729 Aneurysm of unspecified site: Secondary | ICD-10-CM

## 2013-04-05 DIAGNOSIS — Z09 Encounter for follow-up examination after completed treatment for conditions other than malignant neoplasm: Secondary | ICD-10-CM | POA: Insufficient documentation

## 2013-04-05 DIAGNOSIS — Z9889 Other specified postprocedural states: Secondary | ICD-10-CM | POA: Insufficient documentation

## 2013-04-05 DIAGNOSIS — I671 Cerebral aneurysm, nonruptured: Secondary | ICD-10-CM | POA: Insufficient documentation

## 2013-04-05 LAB — CBC WITH DIFFERENTIAL/PLATELET
Basophils Absolute: 0.1 10*3/uL (ref 0.0–0.1)
Basophils Relative: 1 % (ref 0–1)
EOS ABS: 0.2 10*3/uL (ref 0.0–0.7)
EOS PCT: 3 % (ref 0–5)
HCT: 41.6 % (ref 39.0–52.0)
Hemoglobin: 15 g/dL (ref 13.0–17.0)
LYMPHS PCT: 33 % (ref 12–46)
Lymphs Abs: 1.8 10*3/uL (ref 0.7–4.0)
MCH: 34.6 pg — AB (ref 26.0–34.0)
MCHC: 36.1 g/dL — ABNORMAL HIGH (ref 30.0–36.0)
MCV: 95.9 fL (ref 78.0–100.0)
MONO ABS: 0.5 10*3/uL (ref 0.1–1.0)
MONOS PCT: 9 % (ref 3–12)
Neutro Abs: 2.8 10*3/uL (ref 1.7–7.7)
Neutrophils Relative %: 54 % (ref 43–77)
PLATELETS: 137 10*3/uL — AB (ref 150–400)
RBC: 4.34 MIL/uL (ref 4.22–5.81)
RDW: 12.9 % (ref 11.5–15.5)
WBC: 5.3 10*3/uL (ref 4.0–10.5)

## 2013-04-05 LAB — BASIC METABOLIC PANEL
BUN: 10 mg/dL (ref 6–23)
CALCIUM: 9.1 mg/dL (ref 8.4–10.5)
CHLORIDE: 105 meq/L (ref 96–112)
CO2: 22 mEq/L (ref 19–32)
Creatinine, Ser: 0.96 mg/dL (ref 0.50–1.35)
GFR calc Af Amer: >90 mL/min (ref >90)
GFR calc non Af Amer: >90 mL/min (ref >90)
GLUCOSE: 94 mg/dL (ref 70–99)
Potassium: 4 mEq/L (ref 3.7–5.3)
Sodium: 142 mEq/L (ref 137–147)

## 2013-04-05 LAB — PROTIME-INR
INR: 1.21 (ref 0.00–1.49)
Prothrombin Time: 15 seconds (ref 11.6–15.2)

## 2013-04-05 LAB — APTT: aPTT: 31 seconds (ref 24–37)

## 2013-04-05 MED ORDER — MIDAZOLAM HCL 2 MG/2ML IJ SOLN
INTRAMUSCULAR | Status: AC
  Filled 2013-04-05: qty 2

## 2013-04-05 MED ORDER — IOHEXOL 300 MG/ML  SOLN
150.0000 mL | Freq: Once | INTRAMUSCULAR | Status: AC | PRN
  Administered 2013-04-05: 70 mL via INTRA_ARTERIAL

## 2013-04-05 MED ORDER — HYDROCODONE-ACETAMINOPHEN 5-325 MG PO TABS: 1.0000 | ORAL_TABLET | ORAL | Status: DC | PRN

## 2013-04-05 MED ORDER — HEPARIN SODIUM (PORCINE) 1000 UNIT/ML IJ SOLN
INTRAMUSCULAR | Status: AC | PRN
  Administered 2013-04-05: 2000 [IU] via INTRAVENOUS

## 2013-04-05 MED ORDER — FENTANYL CITRATE 0.05 MG/ML IJ SOLN
INTRAMUSCULAR | Status: AC | PRN
  Administered 2013-04-05: 25 ug via INTRAVENOUS

## 2013-04-05 MED ORDER — FENTANYL CITRATE 0.05 MG/ML IJ SOLN
INTRAMUSCULAR | Status: AC
  Filled 2013-04-05: qty 2

## 2013-04-05 MED ORDER — MIDAZOLAM HCL 2 MG/2ML IJ SOLN
INTRAMUSCULAR | Status: AC | PRN
  Administered 2013-04-05: 0.5 mg via INTRAVENOUS

## 2013-04-05 NOTE — Discharge Instructions (Signed)
Angiography, Care After °Refer to this sheet in the next few weeks. These instructions provide you with information on caring for yourself after your procedure. Your health care provider may also give you more specific instructions. Your treatment has been planned according to current medical practices, but problems sometimes occur. Call your health care provider if you have any problems or questions after your procedure.  °WHAT TO EXPECT AFTER THE PROCEDURE °After your procedure, it is typical to have the following sensations: °· Minor discomfort or tenderness and a small bump at the catheter insertion site. The bump should usually decrease in size and tenderness within 1 to 2 weeks. °· Any bruising will usually fade within 2 to 4 weeks. °HOME CARE INSTRUCTIONS  °· You may need to keep taking blood thinners if they were prescribed for you. Only take over-the-counter or prescription medicines for pain, fever, or discomfort as directed by your health care provider. °· Do not apply powder or lotion to the site. °· Do not sit in a bathtub, swimming pool, or whirlpool for 5 to 7 days. °· You may shower 24 hours after the procedure. Remove the bandage (dressing) and gently wash the site with plain soap and water. Gently pat the site dry. °· Inspect the site at least twice daily. °· Limit your activity for the first 48 hours. Do not bend, squat, or lift anything over 20 lb (9 kg) or as directed by your health care provider. °· Do not drive home if you are discharged the day of the procedure. Have someone else drive you. Follow instructions about when you can drive or return to work. °SEEK MEDICAL CARE IF: °· You get lightheaded when standing up. °· You have drainage (other than a small amount of blood on the dressing). °· You have chills. °· You have a fever. °· You have redness, warmth, swelling, or pain at the insertion site. °SEEK IMMEDIATE MEDICAL CARE IF:  °· You develop chest pain or shortness of breath, feel faint,  or pass out. °· You have bleeding, swelling larger than a walnut, or drainage from the catheter insertion site. °· You develop pain, discoloration, coldness, or severe bruising in the leg or arm that held the catheter. °· You develop bleeding from any other place, such as the bowels. You may see bright red blood in your urine or stools, or your stools may appear black and tarry. °· You have heavy bleeding from the site. If this happens, hold pressure on the site. °MAKE SURE YOU: °· Understand these instructions. °· Will watch your condition. °· Will get help right away if you are not doing well or get worse. °Document Released: 07/26/2004 Document Revised: 09/09/2012 Document Reviewed: 06/01/2012 °ExitCare® Patient Information ©2014 [employer]. ° °

## 2013-04-05 NOTE — H&P (Signed)
CC:  Subarachnoid hemorrhage follow-up  HPI: Richard Davenport is a 53 y.o. 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  The patient underwent coil embolization in September 2014.  He made an excellent postoperative recovery and was seen in the outpatient neurosurgery clinic in follow-up.  He presents now for routine follow-up diagnostic angiography approximate 6 months after embolization.  PMH: Past Medical History  Diagnosis Date  . Headache     PSH: Past Surgical History  Procedure Laterality Date  . Radiology with anesthesia N/A 10/09/2012    Procedure: RADIOLOGY WITH ANESTHESIA;  Surgeon: Lisbeth RenshawNeelesh Jomarie Gellis, MD;  [facility];  Service: Radiology;  Laterality: N/A;    SH: History  Substance Use Topics  . Smoking status: Never Smoker   . Smokeless tobacco: Never Used  . Alcohol Use: No    MEDS: Prior to Admission medications   Medication Sig Start Date End Date Taking? Authorizing Provider  ibuprofen (ADVIL,MOTRIN) 200 MG tablet Take 200 mg by mouth every 6 (six) hours as needed for fever, headache or mild pain.   Yes Historical Provider, MD  oxyCODONE (OXY IR/ROXICODONE) 5 MG immediate release tablet Take 5-10 mg by mouth every 6 (six) hours as needed for moderate pain. 11/05/12  Yes Evlyn KannerPamela S Love, PA-C    ALLERGY: Allergies  Allergen Reactions  . Pork-Derived Products     Patient is Muslim and has requested no pork products    ROS: ROS  NEUROLOGIC EXAM: Awake, alert, oriented Memory and concentration grossly intact Speech fluent, appropriate CN grossly intact Motor exam: Upper Extremities Deltoid Bicep Tricep Grip  Right 5/5 5/5 5/5 5/5  Left 5/5 5/5 5/5 5/5   Lower Extremity IP Quad PF DF EHL  Right 5/5 5/5 5/5 5/5 5/5  Left 5/5 5/5 5/5 5/5 5/5   Sensation grossly intact to LT   IMPRESSION: - 53 y.o. male 6 months status post subarachnoid hemorrhage due to a basilar apex aneurysm which was coiled  primarily, Here for routine 6 month follow-up angiogram  PLAN: - Proceed with diagnostic cerebral angiogram with moderate IV sedation and local anesthetic - Likely discharge home postprocedure

## 2013-05-17 ENCOUNTER — Other Ambulatory Visit (HOSPITAL_COMMUNITY): Payer: Self-pay | Admitting: Interventional Radiology

## 2013-05-17 ENCOUNTER — Other Ambulatory Visit: Payer: Self-pay | Admitting: Neurosurgery

## 2013-05-17 ENCOUNTER — Other Ambulatory Visit (HOSPITAL_COMMUNITY): Payer: Self-pay | Admitting: Neurosurgery

## 2013-05-17 DIAGNOSIS — I609 Nontraumatic subarachnoid hemorrhage, unspecified: Secondary | ICD-10-CM

## 2013-05-20 ENCOUNTER — Encounter (HOSPITAL_COMMUNITY): Payer: Self-pay | Admitting: Pharmacy Technician

## 2013-05-21 ENCOUNTER — Encounter (HOSPITAL_COMMUNITY)
Admission: RE | Admit: 2013-05-21 | Discharge: 2013-05-21 | Disposition: A | Discharge status: Home / Self Care | Payer: Medicaid Other | Source: Ambulatory Visit | Attending: Neurosurgery | Admitting: Neurosurgery

## 2013-05-21 ENCOUNTER — Encounter (INDEPENDENT_AMBULATORY_CARE_PROVIDER_SITE_OTHER): Payer: Self-pay

## 2013-05-21 ENCOUNTER — Encounter (HOSPITAL_COMMUNITY): Payer: Self-pay

## 2013-05-21 DIAGNOSIS — Z01812 Encounter for preprocedural laboratory examination: Secondary | ICD-10-CM | POA: Insufficient documentation

## 2013-05-21 HISTORY — DX: Nontraumatic subarachnoid hemorrhage, unspecified: I60.9

## 2013-05-21 HISTORY — DX: Cerebral infarction, unspecified: I63.9

## 2013-05-21 LAB — BASIC METABOLIC PANEL
BUN: 13 mg/dL (ref 6–23)
CO2: 26 mEq/L (ref 19–32)
CREATININE: 1.09 mg/dL (ref 0.50–1.35)
Calcium: 9 mg/dL (ref 8.4–10.5)
Chloride: 104 mEq/L (ref 96–112)
GFR, EST AFRICAN AMERICAN: 90 mL/min — AB (ref >90)
GFR, EST NON AFRICAN AMERICAN: 77 mL/min — AB (ref >90)
Glucose, Bld: 86 mg/dL (ref 70–99)
Potassium: 4.1 mEq/L (ref 3.7–5.3)
Sodium: 142 mEq/L (ref 137–147)

## 2013-05-21 LAB — CBC
HCT: 42 % (ref 39.0–52.0)
Hemoglobin: 14.7 g/dL (ref 13.0–17.0)
MCH: 34.5 pg — ABNORMAL HIGH (ref 26.0–34.0)
MCHC: 35 g/dL (ref 30.0–36.0)
MCV: 98.6 fL (ref 78.0–100.0)
PLATELETS: 122 10*3/uL — AB (ref 150–400)
RBC: 4.26 MIL/uL (ref 4.22–5.81)
RDW: 12.7 % (ref 11.5–15.5)
WBC: 5.8 10*3/uL (ref 4.0–10.5)

## 2013-05-21 LAB — PROTIME-INR
INR: 1.21 (ref 0.00–1.49)
Prothrombin Time: 15 seconds (ref 11.6–15.2)

## 2013-05-21 LAB — APTT: aPTT: 30 seconds (ref 24–37)

## 2013-05-21 NOTE — Pre-Procedure Instructions (Signed)
Richard Davenport  05/21/2013   Your procedure is scheduled on:  Thursday, May 7th  [facility] 0630 AM.  Call this number if you have problems the morning of surgery: [phone]   Remember:   Do not eat food or drink liquids after midnight.   Take these medicines the morning of surgery with A SIP OF WATER: oxycodone if needed   Do not wear jewelry.  Do not wear lotions, powders, or perfumes. You may wear deodorant.  Do not shave 48 hours prior to surgery. Men may shave face and neck.  Do not bring valuables to the hospital.  [facility] any belongings or valuables.               Contacts, dentures or bridgework may not be worn into surgery.  Leave suitcase in the car. After surgery it may be brought to your room.  For patients admitted to the hospital, discharge time is determined by your treatment team.               Patients discharged the day of surgery will not be allowed to drive home.  Please read over the following fact sheets that you were given: Pain Booklet, Coughing and Deep Breathing and Surgical Site Infection Prevention [place] - Preparing for Surgery  Before surgery, you can play an important role.  Because skin is not sterile, your skin needs to be as free of germs as possible.  You can reduce the number of germs on you skin by washing with CHG (chlorahexidine gluconate) soap before surgery.  CHG is an antiseptic cleaner which kills germs and bonds with the skin to continue killing germs even after washing.  Please DO NOT use if you have an allergy to CHG or antibacterial soaps.  If your skin becomes reddened/irritated stop using the CHG and inform your nurse when you arrive at Short Stay.  Do not shave (including legs and underarms) for at least 48 hours prior to the first CHG shower.  You may shave your face.  Please follow these instructions carefully:   1.  Shower with CHG Soap the night before surgery and  the morning of Surgery.  2.  If you choose to wash your hair, wash your hair first as usual with your normal shampoo.  3.  After you shampoo, rinse your hair and body thoroughly to remove the shampoo.  4.  Use CHG as you would any other liquid soap.  You can apply CHG directly to the skin and wash gently with scrungie or a clean washcloth.  5.  Apply the CHG Soap to your body ONLY FROM THE NECK DOWN.  Do not use on open wounds or open sores.  Avoid contact with your eyes, ears, mouth and genitals (private parts).  Wash genitals (private parts) with your normal soap.  6.  Wash thoroughly, paying special attention to the area where your surgery will be performed.  7.  Thoroughly rinse your body with warm water from the neck down.  8.  DO NOT shower/wash with your normal soap after using and rinsing off the CHG Soap.  9.  Pat yourself dry with a clean towel.            10.  Wear clean pajamas.            11.  Place clean sheets on your bed the night of your first shower and do not sleep with  pets.  Day of Surgery  Do not apply any lotions/deoderants the morning of surgery.  Please wear clean clothes to the hospital/surgery center.

## 2013-05-24 ENCOUNTER — Other Ambulatory Visit: Payer: Self-pay | Admitting: Radiology

## 2013-05-26 MED ORDER — CEFAZOLIN SODIUM-DEXTROSE 2-3 GM-% IV SOLR
2.0000 g | INTRAVENOUS | Status: AC
  Administered 2013-05-27: 2 g via INTRAVENOUS
  Filled 2013-05-26: qty 50

## 2013-05-27 ENCOUNTER — Observation Stay (HOSPITAL_COMMUNITY)
Admission: RE | Admit: 2013-05-27 | Discharge: 2013-05-28 | Disposition: A | Discharge status: Home / Self Care | Payer: Self-pay | Source: Ambulatory Visit | Attending: Neurosurgery | Admitting: Neurosurgery

## 2013-05-27 ENCOUNTER — Encounter (HOSPITAL_COMMUNITY): Payer: Self-pay | Admitting: Anesthesiology

## 2013-05-27 ENCOUNTER — Ambulatory Visit (HOSPITAL_COMMUNITY)
Admission: RE | Admit: 2013-05-27 | Discharge: 2013-05-27 | Disposition: A | Discharge status: Home / Self Care | Payer: Self-pay | Source: Ambulatory Visit | Attending: Neurosurgery | Admitting: Neurosurgery

## 2013-05-27 ENCOUNTER — Encounter (HOSPITAL_COMMUNITY)
Admission: RE | Disposition: A | Discharge status: Home / Self Care | Payer: Self-pay | Source: Ambulatory Visit | Attending: Neurosurgery

## 2013-05-27 ENCOUNTER — Encounter (HOSPITAL_COMMUNITY): Payer: Self-pay | Admitting: Certified Registered"

## 2013-05-27 ENCOUNTER — Ambulatory Visit (HOSPITAL_COMMUNITY): Payer: Self-pay | Admitting: Anesthesiology

## 2013-05-27 DIAGNOSIS — I671 Cerebral aneurysm, nonruptured: Principal | ICD-10-CM | POA: Insufficient documentation

## 2013-05-27 DIAGNOSIS — I609 Nontraumatic subarachnoid hemorrhage, unspecified: Secondary | ICD-10-CM

## 2013-05-27 DIAGNOSIS — Z8673 Personal history of transient ischemic attack (TIA), and cerebral infarction without residual deficits: Secondary | ICD-10-CM | POA: Insufficient documentation

## 2013-05-27 DIAGNOSIS — Z7982 Long term (current) use of aspirin: Secondary | ICD-10-CM | POA: Insufficient documentation

## 2013-05-27 DIAGNOSIS — Z7902 Long term (current) use of antithrombotics/antiplatelets: Secondary | ICD-10-CM | POA: Insufficient documentation

## 2013-05-27 DIAGNOSIS — Z9889 Other specified postprocedural states: Secondary | ICD-10-CM | POA: Insufficient documentation

## 2013-05-27 DIAGNOSIS — R51 Headache: Secondary | ICD-10-CM | POA: Insufficient documentation

## 2013-05-27 HISTORY — PX: RADIOLOGY WITH ANESTHESIA: SHX6223

## 2013-05-27 SURGERY — RADIOLOGY WITH ANESTHESIA
Anesthesia: Monitor Anesthesia Care

## 2013-05-27 MED ORDER — ROCURONIUM BROMIDE 100 MG/10ML IV SOLN
INTRAVENOUS | Status: DC | PRN
  Administered 2013-05-27: 20 mg via INTRAVENOUS
  Administered 2013-05-27 (×3): 10 mg via INTRAVENOUS
  Administered 2013-05-27: 50 mg via INTRAVENOUS

## 2013-05-27 MED ORDER — LABETALOL HCL 5 MG/ML IV SOLN: 10.0000 mg | INTRAVENOUS | Status: DC | PRN

## 2013-05-27 MED ORDER — ONDANSETRON HCL 4 MG PO TABS
4.0000 mg | ORAL_TABLET | ORAL | Status: DC | PRN
  Filled 2013-05-27: qty 1

## 2013-05-27 MED ORDER — PHENYLEPHRINE HCL 10 MG/ML IJ SOLN
10.0000 mg | INTRAVENOUS | Status: DC | PRN
  Administered 2013-05-27: 30 ug/min via INTRAVENOUS

## 2013-05-27 MED ORDER — SODIUM CHLORIDE 0.9 % IV SOLN
INTRAVENOUS | Status: DC | PRN
  Administered 2013-05-27 (×3): via INTRAVENOUS

## 2013-05-27 MED ORDER — HEPARIN SODIUM (PORCINE) 1000 UNIT/ML IJ SOLN
INTRAMUSCULAR | Status: DC | PRN
  Administered 2013-05-27: 1000 [IU] via INTRAVENOUS
  Administered 2013-05-27: 5000 [IU] via INTRAVENOUS

## 2013-05-27 MED ORDER — ONDANSETRON HCL 4 MG/2ML IJ SOLN
4.0000 mg | INTRAMUSCULAR | Status: DC | PRN
  Filled 2013-05-27: qty 2

## 2013-05-27 MED ORDER — ASPIRIN EC 325 MG PO TBEC
325.0000 mg | DELAYED_RELEASE_TABLET | Freq: Every day | ORAL | Status: DC
  Administered 2013-05-27 – 2013-05-28 (×2): 325 mg via ORAL
  Filled 2013-05-27 (×2): qty 1

## 2013-05-27 MED ORDER — NEOSTIGMINE METHYLSULFATE 10 MG/10ML IV SOLN
INTRAVENOUS | Status: DC | PRN
  Administered 2013-05-27: 3 mg via INTRAVENOUS

## 2013-05-27 MED ORDER — PROMETHAZINE HCL 25 MG PO TABS: 12.5000 mg | ORAL_TABLET | ORAL | Status: DC | PRN

## 2013-05-27 MED ORDER — PANTOPRAZOLE SODIUM 40 MG IV SOLR
40.0000 mg | Freq: Every day | INTRAVENOUS | Status: DC
  Administered 2013-05-27: 40 mg via INTRAVENOUS
  Filled 2013-05-27: qty 40

## 2013-05-27 MED ORDER — EPHEDRINE SULFATE 50 MG/ML IJ SOLN
INTRAMUSCULAR | Status: DC | PRN
  Administered 2013-05-27: 10 mg via INTRAVENOUS

## 2013-05-27 MED ORDER — ASPIRIN EC 325 MG PO TBEC: 325.0000 mg | DELAYED_RELEASE_TABLET | Freq: Every day | ORAL | Status: DC

## 2013-05-27 MED ORDER — CLOPIDOGREL BISULFATE 75 MG PO TABS
75.0000 mg | ORAL_TABLET | Freq: Every day | ORAL | Status: DC
  Administered 2013-05-27 – 2013-05-28 (×2): 75 mg via ORAL
  Filled 2013-05-27 (×3): qty 1

## 2013-05-27 MED ORDER — LACTATED RINGERS IV SOLN
INTRAVENOUS | Status: DC
  Administered 2013-05-27: 07:00:00 via INTRAVENOUS

## 2013-05-27 MED ORDER — ONDANSETRON HCL 4 MG/2ML IJ SOLN: 4.0000 mg | Freq: Once | INTRAMUSCULAR | Status: DC | PRN

## 2013-05-27 MED ORDER — FENTANYL CITRATE 0.05 MG/ML IJ SOLN
INTRAMUSCULAR | Status: DC | PRN
Start: 2013-05-27 — End: 2013-05-27
  Administered 2013-05-27: 50 ug via INTRAVENOUS
  Administered 2013-05-27: 200 ug via INTRAVENOUS

## 2013-05-27 MED ORDER — ONDANSETRON HCL 4 MG/2ML IJ SOLN
INTRAMUSCULAR | Status: DC | PRN
  Administered 2013-05-27: 4 mg via INTRAVENOUS

## 2013-05-27 MED ORDER — GLYCOPYRROLATE 0.2 MG/ML IJ SOLN
INTRAMUSCULAR | Status: DC | PRN
  Administered 2013-05-27: 0.2 mg via INTRAVENOUS
  Administered 2013-05-27: 0.4 mg via INTRAVENOUS

## 2013-05-27 MED ORDER — HEPARIN SODIUM (PORCINE) 5000 UNIT/ML IJ SOLN: 5000.0000 [IU] | Freq: Three times a day (TID) | INTRAMUSCULAR | Status: DC

## 2013-05-27 MED ORDER — NITROGLYCERIN 0.2 MG/ML ON CALL CATH LAB
INTRAVENOUS | Status: DC | PRN
  Administered 2013-05-27: 40 ug via INTRAVENOUS
  Administered 2013-05-27: 80 ug via INTRAVENOUS

## 2013-05-27 MED ORDER — SODIUM CHLORIDE 0.9 % IV SOLN
INTRAVENOUS | Status: DC
  Administered 2013-05-28: 1 mL via INTRAVENOUS

## 2013-05-27 MED ORDER — HYDROMORPHONE HCL PF 1 MG/ML IJ SOLN: 0.2500 mg | INTRAMUSCULAR | Status: DC | PRN

## 2013-05-27 MED ORDER — SODIUM CHLORIDE 0.9 % IV SOLN
INTRAVENOUS | Status: DC | PRN
Start: 2013-05-27 — End: 2013-05-27

## 2013-05-27 MED ORDER — LIDOCAINE HCL (CARDIAC) 20 MG/ML IV SOLN
INTRAVENOUS | Status: DC | PRN
  Administered 2013-05-27: 60 mg via INTRAVENOUS

## 2013-05-27 MED ORDER — PROPOFOL 10 MG/ML IV BOLUS
INTRAVENOUS | Status: DC | PRN
  Administered 2013-05-27: 160 mg via INTRAVENOUS

## 2013-05-27 MED ORDER — OXYCODONE HCL 5 MG PO TABS
5.0000 mg | ORAL_TABLET | Freq: Four times a day (QID) | ORAL | Status: DC | PRN
  Administered 2013-05-27: 10 mg via ORAL
  Administered 2013-05-27 (×2): 5 mg via ORAL
  Administered 2013-05-28: 10 mg via ORAL
  Filled 2013-05-27 (×2): qty 2
  Filled 2013-05-27 (×2): qty 1

## 2013-05-27 MED ORDER — IOHEXOL 300 MG/ML  SOLN
150.0000 mL | Freq: Once | INTRAMUSCULAR | Status: AC | PRN
  Administered 2013-05-27: 70 mL via INTRA_ARTERIAL

## 2013-05-27 NOTE — Anesthesia Preprocedure Evaluation (Signed)
Anesthesia Evaluation  Patient identified by MRN, date of birth, ID band Patient awake    Reviewed: Allergy & Precautions, H&P , NPO status , Patient's Chart, lab work & pertinent test results  Airway Mallampati: II TM Distance: >3 FB Neck ROM: Full    Dental  (+) Teeth Intact, Dental Advisory Given   Pulmonary  breath sounds clear to auscultation        Cardiovascular Rhythm:Regular Rate:Normal     Neuro/Psych    GI/Hepatic   Endo/Other    Renal/GU      Musculoskeletal   Abdominal   Peds  Hematology   Anesthesia Other Findings   Reproductive/Obstetrics                           Anesthesia Physical Anesthesia Plan  ASA: III  Anesthesia Plan: MAC   Post-op Pain Management:    Induction: Intravenous  Airway Management Planned:   Additional Equipment:   Intra-op Plan:   Post-operative Plan:   Informed Consent: I have reviewed the patients History and Physical, chart, labs and discussed the procedure including the risks, benefits and alternatives for the proposed anesthesia with the patient or authorized representative who has indicated his/her understanding and acceptance.     Plan Discussed with: CRNA and Anesthesiologist  Anesthesia Plan Comments:         Anesthesia Quick Evaluation

## 2013-05-27 NOTE — Progress Notes (Signed)
Called for sign out

## 2013-05-27 NOTE — Anesthesia Postprocedure Evaluation (Signed)
  Anesthesia Post-op Note  Patient: Richard Davenport  Procedure(s) Performed: Procedure(s): RADIOLOGY WITH ANESTHESIA (N/A)  Patient Location: PACU  Anesthesia Type:General  Level of Consciousness: awake, alert  and oriented  Airway and Oxygen Therapy: Patient Spontanous Breathing and Patient connected to nasal cannula oxygen  Post-op Pain: mild  Post-op Assessment: Post-op Vital signs reviewed, Patient's Cardiovascular Status Stable, Respiratory Function Stable, Patent Airway and Pain level controlled  Post-op Vital Signs: stable  Last Vitals:  Filed Vitals:   05/27/13 1730  BP: 111/76  Pulse: 59  Temp:   Resp: 15    Complications: No apparent anesthesia complications

## 2013-05-27 NOTE — Transfer of Care (Signed)
Immediate Anesthesia Transfer of Care Note  Patient: Richard Davenport  Procedure(s) Performed: Procedure(s): RADIOLOGY WITH ANESTHESIA (N/A)  Patient Location: PACU  Anesthesia Type:General  Level of Consciousness: awake, alert , oriented and patient cooperative  Airway & Oxygen Therapy: Patient Spontanous Breathing and Patient connected to nasal cannula oxygen  Post-op Assessment: Report given to PACU RN, Post -op Vital signs reviewed and stable and Patient moving all extremities  Post vital signs: Reviewed and stable  Complications: No apparent anesthesia complications

## 2013-05-27 NOTE — H&P (Signed)
CC:  Brain Aneurysm  HPI: Richard Davenport is a 53 y.o. male who initially presented to the hospital more than 6 months ago with subarachnoid hemorrhage. He underwent primary coil embolization of a basilar apex aneurysm N. Mabie excellent recovery. Followup angiogram demonstrated a proximally 4 mm aneurysm recurrence at the neck. He was subsequently seen in the office and treatment options were discussed including continued observation versus stent supported coiling.  He has been taking aspirin 325 mg daily and Plavix 75 mg daily for the last 10 days, and does not report any excessive bruising, bloody noses, or bleeding from the gums.  PMH: Past Medical History  Diagnosis Date  . Headache   . Subarachnoid hemorrhage   . Stroke     PSH: Past Surgical History  Procedure Laterality Date  . Radiology with anesthesia N/A 10/09/2012    Procedure: RADIOLOGY WITH ANESTHESIA;  Surgeon: Lisbeth RenshawNeelesh Marleta Lapierre, MD;  [facility];  Service: Radiology;  Laterality: N/A;  . Aneurysm coiling      for bleed    SH: History  Substance Use Topics  . Smoking status: Never Smoker   . Smokeless tobacco: Never Used  . Alcohol Use: No    MEDS: Prior to Admission medications   Medication Sig Start Date End Date Taking? Authorizing Provider  aspirin 325 MG EC tablet Take 325 mg by mouth daily.   Yes Historical Provider, MD  clopidogrel (PLAVIX) 75 MG tablet Take 75 mg by mouth daily with breakfast.   Yes Historical Provider, MD  oxyCODONE (OXY IR/ROXICODONE) 5 MG immediate release tablet Take 5-10 mg by mouth every 6 (six) hours as needed for moderate pain. 11/05/12  Yes Evlyn KannerPamela S Love, PA-C    ALLERGY: Allergies  Allergen Reactions  . Pork-Derived Products     Patient is Muslim and has requested no pork products    ROS: ROS  NEUROLOGIC EXAM: Awake, alert, oriented Memory and concentration grossly intact Speech fluent, appropriate CN grossly intact Motor exam: Upper Extremities Deltoid Bicep  Tricep Grip  Right 5/5 5/5 5/5 5/5  Left 5/5 5/5 5/5 5/5   Lower Extremity IP Quad PF DF EHL  Right 5/5 5/5 5/5 5/5 5/5  Left 5/5 5/5 5/5 5/5 5/5   Sensation grossly intact to LT  [facility]: Recent diagnostic cerebral angiogram demonstrates a proximally 4 mm neck recurrence of a previously coil basilar apex aneurysm.  IMPRESSION: - 53 y.o. male presenting for elective stent supported coil embolization of a recurrent basilar apex aneurysm status post subarachnoid hemorrhage.  PLAN: - Proceed with coil embolization with likely stent support - ICU postop  I spoke at length with the patient and his family regarding the imaging findings thus far. The risks of the angiogram and coiling procedure were also reviewed to include stroke and aneurysm re-rupture leading to weakness/paralysis/coma/death, infection, SZ, hydrocephalus.   The patient and his family understood our discussion and the provided consent to proceed with aneurysm treatment.

## 2013-05-27 NOTE — Anesthesia Procedure Notes (Signed)
Procedure Name: Intubation Date/Time: 05/27/2013 8:51 AM Performed by: Jerilee HohMUMM, Anuar Walgren N Pre-anesthesia Checklist: Patient identified, Emergency Drugs available, Suction available and Patient being monitored Patient Re-evaluated:Patient Re-evaluated prior to inductionOxygen Delivery Method: Circle system utilized Preoxygenation: Pre-oxygenation with 100% oxygen Intubation Type: IV induction Ventilation: Mask ventilation without difficulty and Oral airway inserted - appropriate to patient size Laryngoscope Size: Mac and 4 Grade View: Grade I Tube type: Oral Tube size: 7.5 mm Number of attempts: 1 Airway Equipment and Method: Stylet Placement Confirmation: ETT inserted through vocal cords under direct vision,  positive ETCO2 and breath sounds checked- equal and bilateral Secured at: 21 cm Tube secured with: Tape Dental Injury: Teeth and Oropharynx as per pre-operative assessment

## 2013-05-27 NOTE — Brief Op Note (Signed)
PREOP DX: Recurrent basilar apex aneurysm  POSTOP DX: Same  PROCEDURE: Stent-supported coil embolization of recurrent basilar aneurysm  SURGEON: Dr. Lisbeth RenshawNeelesh Nykira Reddix, MD  ANESTHESIA: GETA  EBL: Minimal  SPECIMENS: None  COMPLICATIONS: None  CONDITION: Stable to recovery  FINDINGS: 1. Y-stent supported coil embolization of recurrent basilar with complete occlusion of aneurysm. No local thrombotic or embolic events identified. 2. 8Fr access closed with good hemostasis with 30F exoseal

## 2013-05-28 ENCOUNTER — Encounter (HOSPITAL_COMMUNITY): Payer: Self-pay | Admitting: Neurosurgery

## 2013-05-28 MED ORDER — OXYCODONE HCL 5 MG PO TABS: 5.0000 mg | ORAL_TABLET | Freq: Four times a day (QID) | ORAL | Status: DC | PRN

## 2013-05-28 NOTE — Discharge Summary (Signed)
  Physician Discharge Summary  Patient ID: Richard Davenport MRN: [id number] DOB/AGE: 1960-10-13 53 y.o.  Admit date: 05/27/2013 Discharge date: 05/28/2013  Admission Diagnoses: recurrent basilar aneurysm  Discharge Diagnoses: Same Active Problems:   Cerebral aneurysm, nonruptured   Discharged Condition: Stable  Hospital Course:  Mr. Richard Burnahir Frees is a 53 y.o. male electively admitted after uncomplicated Y-stent supported coil embolization. He was at baseline postop and was monitored in the ICU. On POD#1 he remained well, had mild HA. He was ambulating well, tolerating diet, and voiding without difficulty.  Treatments: Surgery - Coil embolization of basilar aneurysm  Discharge Exam: Blood pressure 115/76, pulse 73, temperature 98.2 F (36.8 C), temperature source Oral, resp. rate 18, height 5\' 8"  (1.727 m), weight 86.1 kg (189 lb 13.1 oz), SpO2 95.00%. Awake, alert, oriented Speech fluent, appropriate CN grossly intact 5/5 BUE/BLE Right groin site soft, no hematoma.  Follow-up: Follow-up in my office [facility] [phone]) in 2-3 weeks  Disposition: 01-Home or Self Care     Medication List         aspirin 325 MG EC tablet  Take 325 mg by mouth daily.     clopidogrel 75 MG tablet  Commonly known as:  PLAVIX  Take 75 mg by mouth daily with breakfast.     oxyCODONE 5 MG immediate release tablet  Commonly known as:  Oxy IR/ROXICODONE  Take 1 tablet (5 mg total) by mouth every 6 (six) hours as needed for moderate pain.         SignedLisbeth Renshaw: Ashantee Deupree 05/28/2013, 8:52 AM

## 2013-05-28 NOTE — Progress Notes (Signed)
Pt seen and examined. No issues overnight. Pt does have HA somewhat relieved with Oxy. No visual changes, N/T/W, facial droop or difficulty swallowing.  EXAM: Temp:  [97.8 F (36.6 C)-98.7 F (37.1 C)] 98.2 F (36.8 C) (05/08 0004) Pulse Rate:  [54-96] 73 (05/08 0800) Resp:  [13-36] 18 (05/08 0533) BP: (101-134)/(64-81) 115/76 mmHg (05/08 0800) SpO2:  [94 %-100 %] 95 % (05/08 0800) Arterial Line BP: (119-161)/(55-83) 137/68 mmHg (05/08 0533) Weight:  [86.1 kg (189 lb 13.1 oz)] 86.1 kg (189 lb 13.1 oz) (05/07 1340) Intake/Output     05/07 0701 - 05/08 0700 05/08 0701 - 05/09 0700   P.O. 360    I.V. (mL/kg) 3300 (38.3) 200 (2.3)   Total Intake(mL/kg) 3660 (42.5) 200 (2.3)   Urine (mL/kg/hr) 2575 (1.2)    Total Output 2575     Net +1085 +200         Awake, alert, oriented Speech fluent CN intact Good strength throughout, no drift  LABS: Lab Results  Component Value Date   CREATININE 1.09 05/21/2013   BUN 13 05/21/2013   NA 142 05/21/2013   K 4.1 05/21/2013   CL 104 05/21/2013   CO2 26 05/21/2013   Lab Results  Component Value Date   WBC 5.8 05/21/2013   HGB 14.7 05/21/2013   HCT 42.0 05/21/2013   MCV 98.6 05/21/2013   PLT 122* 05/21/2013    IMPRESSION: - 53 y.o. male POD#1 s/p stent-supported coil embolization of recurrent basilar apex aneurysm. - Neurologically at baseline  PLAN: - Ambulate this am - D/C Home, cont daily ASA 325mg  and Plavix 75mg  - F/U in my office in 2-3 weeks

## 2013-05-28 NOTE — Progress Notes (Signed)
Patient ambulated around unit without any issues.  Discharge instructions and medication instructions given to patient including script and follow up appointments.  No issues or concerns voiced by family.  Patient discharged home.

## 2013-08-26 ENCOUNTER — Observation Stay (HOSPITAL_COMMUNITY)
Admission: EM | Admit: 2013-08-26 | Discharge: 2013-08-27 | Disposition: A | Discharge status: Home / Self Care | Payer: Medicaid Other | Attending: Internal Medicine | Admitting: Internal Medicine

## 2013-08-26 ENCOUNTER — Emergency Department (HOSPITAL_COMMUNITY): Payer: Medicaid Other

## 2013-08-26 ENCOUNTER — Encounter (HOSPITAL_COMMUNITY): Payer: Self-pay | Admitting: Emergency Medicine

## 2013-08-26 DIAGNOSIS — R42 Dizziness and giddiness: Secondary | ICD-10-CM | POA: Diagnosis present

## 2013-08-26 DIAGNOSIS — Z7902 Long term (current) use of antithrombotics/antiplatelets: Secondary | ICD-10-CM | POA: Diagnosis not present

## 2013-08-26 DIAGNOSIS — Z9889 Other specified postprocedural states: Secondary | ICD-10-CM | POA: Diagnosis not present

## 2013-08-26 DIAGNOSIS — Z9119 Patient's noncompliance with other medical treatment and regimen: Secondary | ICD-10-CM | POA: Diagnosis not present

## 2013-08-26 DIAGNOSIS — R29898 Other symptoms and signs involving the musculoskeletal system: Secondary | ICD-10-CM

## 2013-08-26 DIAGNOSIS — G459 Transient cerebral ischemic attack, unspecified: Secondary | ICD-10-CM

## 2013-08-26 DIAGNOSIS — Z9114 Patient's other noncompliance with medication regimen: Secondary | ICD-10-CM

## 2013-08-26 DIAGNOSIS — I609 Nontraumatic subarachnoid hemorrhage, unspecified: Secondary | ICD-10-CM

## 2013-08-26 DIAGNOSIS — J9601 Acute respiratory failure with hypoxia: Secondary | ICD-10-CM

## 2013-08-26 DIAGNOSIS — Z8673 Personal history of transient ischemic attack (TIA), and cerebral infarction without residual deficits: Secondary | ICD-10-CM | POA: Insufficient documentation

## 2013-08-26 DIAGNOSIS — N179 Acute kidney failure, unspecified: Secondary | ICD-10-CM | POA: Diagnosis not present

## 2013-08-26 DIAGNOSIS — Z91199 Patient's noncompliance with other medical treatment and regimen due to unspecified reason: Secondary | ICD-10-CM | POA: Insufficient documentation

## 2013-08-26 DIAGNOSIS — Z7982 Long term (current) use of aspirin: Secondary | ICD-10-CM | POA: Diagnosis not present

## 2013-08-26 DIAGNOSIS — Z8679 Personal history of other diseases of the circulatory system: Secondary | ICD-10-CM | POA: Diagnosis not present

## 2013-08-26 DIAGNOSIS — I671 Cerebral aneurysm, nonruptured: Secondary | ICD-10-CM

## 2013-08-26 HISTORY — DX: Transient cerebral ischemic attack, unspecified: G45.9

## 2013-08-26 LAB — COMPREHENSIVE METABOLIC PANEL
ALT: 20 U/L (ref 0–53)
AST: 26 U/L (ref 0–37)
Albumin: 3.9 g/dL (ref 3.5–5.2)
Alkaline Phosphatase: 108 U/L (ref 39–117)
Anion gap: 12 (ref 5–15)
BUN: 10 mg/dL (ref 6–23)
CO2: 24 mEq/L (ref 19–32)
Calcium: 9.2 mg/dL (ref 8.4–10.5)
Chloride: 106 mEq/L (ref 96–112)
Creatinine, Ser: 1.17 mg/dL (ref 0.50–1.35)
GFR calc Af Amer: 82 mL/min — ABNORMAL LOW (ref >90)
GFR calc non Af Amer: 71 mL/min — ABNORMAL LOW (ref >90)
Glucose, Bld: 117 mg/dL — ABNORMAL HIGH (ref 70–99)
POTASSIUM: 3.9 meq/L (ref 3.7–5.3)
SODIUM: 142 meq/L (ref 137–147)
TOTAL PROTEIN: 7.8 g/dL (ref 6.0–8.3)
Total Bilirubin: 0.5 mg/dL (ref 0.3–1.2)

## 2013-08-26 LAB — GLUCOSE, CAPILLARY: GLUCOSE-CAPILLARY: 92 mg/dL (ref 70–99)

## 2013-08-26 LAB — DIFFERENTIAL
Basophils Absolute: 0 10*3/uL (ref 0.0–0.1)
Basophils Relative: 0 % (ref 0–1)
EOS ABS: 0.2 10*3/uL (ref 0.0–0.7)
Eosinophils Relative: 3 % (ref 0–5)
Lymphocytes Relative: 27 % (ref 12–46)
Lymphs Abs: 1.6 10*3/uL (ref 0.7–4.0)
Monocytes Absolute: 0.4 10*3/uL (ref 0.1–1.0)
Monocytes Relative: 7 % (ref 3–12)
Neutro Abs: 3.8 10*3/uL (ref 1.7–7.7)
Neutrophils Relative %: 63 % (ref 43–77)

## 2013-08-26 LAB — I-STAT CHEM 8, ED
BUN: 9 mg/dL (ref 6–23)
Calcium, Ion: 1.22 mmol/L (ref 1.12–1.23)
Chloride: 105 mEq/L (ref 96–112)
Creatinine, Ser: 1.4 mg/dL — ABNORMAL HIGH (ref 0.50–1.35)
Glucose, Bld: 120 mg/dL — ABNORMAL HIGH (ref 70–99)
HCT: 47 % (ref 39.0–52.0)
Hemoglobin: 16 g/dL (ref 13.0–17.0)
POTASSIUM: 3.8 meq/L (ref 3.7–5.3)
SODIUM: 142 meq/L (ref 137–147)
TCO2: 23 mmol/L (ref 0–100)

## 2013-08-26 LAB — I-STAT TROPONIN, ED: Troponin i, poc: 0 ng/mL (ref 0.00–0.08)

## 2013-08-26 LAB — CBC
HCT: 42.4 % (ref 39.0–52.0)
Hemoglobin: 14.7 g/dL (ref 13.0–17.0)
MCH: 34.5 pg — AB (ref 26.0–34.0)
MCHC: 34.7 g/dL (ref 30.0–36.0)
MCV: 99.5 fL (ref 78.0–100.0)
PLATELETS: 127 10*3/uL — AB (ref 150–400)
RBC: 4.26 MIL/uL (ref 4.22–5.81)
RDW: 13.1 % (ref 11.5–15.5)
WBC: 6 10*3/uL (ref 4.0–10.5)

## 2013-08-26 LAB — PROTIME-INR
INR: 1.42 (ref 0.00–1.49)
Prothrombin Time: 17.4 seconds — ABNORMAL HIGH (ref 11.6–15.2)

## 2013-08-26 LAB — APTT: APTT: 28 s (ref 24–37)

## 2013-08-26 LAB — ETHANOL

## 2013-08-26 MED ORDER — CLOPIDOGREL BISULFATE 75 MG PO TABS
75.0000 mg | ORAL_TABLET | Freq: Every day | ORAL | Status: DC
  Filled 2013-08-26: qty 1

## 2013-08-26 MED ORDER — SODIUM CHLORIDE 0.9 % IJ SOLN
3.0000 mL | Freq: Two times a day (BID) | INTRAMUSCULAR | Status: DC
  Administered 2013-08-26 – 2013-08-27 (×2): 3 mL via INTRAVENOUS

## 2013-08-26 MED ORDER — CLOPIDOGREL BISULFATE 75 MG PO TABS
75.0000 mg | ORAL_TABLET | Freq: Every day | ORAL | Status: DC
  Administered 2013-08-27: 75 mg via ORAL
  Filled 2013-08-26: qty 1

## 2013-08-26 MED ORDER — STROKE: EARLY STAGES OF RECOVERY BOOK
Freq: Once | Status: AC
  Administered 2013-08-26: 1
  Filled 2013-08-26: qty 1

## 2013-08-26 MED ORDER — ALBUTEROL SULFATE (2.5 MG/3ML) 0.083% IN NEBU: 2.5000 mg | INHALATION_SOLUTION | RESPIRATORY_TRACT | Status: DC | PRN

## 2013-08-26 MED ORDER — ACETAMINOPHEN 325 MG PO TABS: 650.0000 mg | ORAL_TABLET | ORAL | Status: DC | PRN

## 2013-08-26 MED ORDER — ONDANSETRON HCL 4 MG/2ML IJ SOLN: 4.0000 mg | Freq: Four times a day (QID) | INTRAMUSCULAR | Status: DC | PRN

## 2013-08-26 MED ORDER — ASPIRIN 325 MG PO TABS
325.0000 mg | ORAL_TABLET | Freq: Every day | ORAL | Status: DC
  Administered 2013-08-27: 325 mg via ORAL
  Filled 2013-08-26: qty 1

## 2013-08-26 MED ORDER — SODIUM CHLORIDE 0.9 % IJ SOLN: 3.0000 mL | INTRAMUSCULAR | Status: DC | PRN

## 2013-08-26 MED ORDER — SODIUM CHLORIDE 0.9 % IV SOLN: 250.0000 mL | INTRAVENOUS | Status: DC | PRN

## 2013-08-26 MED ORDER — HEPARIN SODIUM (PORCINE) 5000 UNIT/ML IJ SOLN
5000.0000 [IU] | Freq: Three times a day (TID) | INTRAMUSCULAR | Status: DC
Start: 2013-08-27 — End: 2013-08-27
  Filled 2013-08-26: qty 1

## 2013-08-26 MED ORDER — ASPIRIN EC 325 MG PO TBEC: 325.0000 mg | DELAYED_RELEASE_TABLET | Freq: Every day | ORAL | Status: DC

## 2013-08-26 MED ORDER — CLOPIDOGREL BISULFATE 75 MG PO TABS: 300.0000 mg | ORAL_TABLET | Freq: Once | ORAL | Status: DC

## 2013-08-26 NOTE — Progress Notes (Signed)
Patient arrived to 4N07 AAOx4. Vital signs were taken and tele was placed. Call bell is by his side and questions were answered. Will continue to monitor. [name], [place] ScrapeSarah E

## 2013-08-26 NOTE — ED Notes (Signed)
Attempted to call report to 6N.

## 2013-08-26 NOTE — ED Provider Notes (Signed)
CSN: [id number]     Arrival date & time 08/26/13  1653 History   First MD Initiated Contact with Patient 08/26/13 1656     No chief complaint on file.    (Consider location/radiation/quality/duration/timing/severity/associated sxs/prior Treatment) HPI 53 y.o. 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  Symptoms mostly resolved after 30 minutes.    He has not  been taking clopidogrel for the past three weeks. He felt dizzy when symtpoms started.  He was  Driving during event.  He denies visual change, headache, numbness, neck pain, nausea, chest pain, or sweating.    Past Medical History  Diagnosis Date  . Headache   . Subarachnoid hemorrhage   . Stroke    Filed: 05/27/2013  8:21 AM Note Time: 05/27/2013  8:16 AM Status: Signed     Editor: Lisbeth Renshaw, MD (Physician)        CC:   Brain Aneurysm  HPI: Richard Davenport is a 53 y.o. male who initially presented to the hospital more than 6 months ago with subarachnoid hemorrhage. He underwent primary coil embolization of a basilar apex aneurysm N. Mabie excellent recovery. Followup angiogram demonstrated a proximally 4 mm aneurysm recurrence at the neck. He was subsequently seen in the office and treatment options were discussed including continued observation versus stent supported coiling.  He has been taking aspirin 325 mg daily and Plavix 75 mg daily for the last 10 days, and does not report any excessive bruising, bloody noses, or bleeding from the gums.    Author: Lisbeth Renshaw, MD Service: Neurosurgery Author Type: Physician     Filed: 10/27/2012 11:05 AM Note Time: 10/27/2012 10:58 AM Status: Signed    Editor: Lisbeth Renshaw, MD (Physician)            Physician Discharge Summary   Patient ID: Richard Davenport MRN: [id number] DOB/AGE: 11-06-1960 52 y.o.  Admit date: 10/08/2012 Discharge  date: 10/27/2012  Admission Diagnoses: Subarachnoid Hemorrhage  Discharge Diagnoses: Same Principal Problem:   SAH (subarachnoid hemorrhage) Active Problems:   Altered mental status   Acute respiratory failure with hypoxia   Discharged Condition: Stable  Hospital Course:   [name] is a 53 y.o. man who presented to [facility] with altered mental status as a transfer from an outside hospital. CT scan demonstrated subarachnoid hemorrhage. 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 He was returned to the neuro-ICU where he was found postoperatively to be neurologically intact. He was extubated and monitored in the ICU for vasospasm with a largely uncomplicated ICU stay. No clinical vasospasm was observed. He was then transferred to the neuroscience floor where he was seen and evaluated by PT and OT and found to be a good candidate for inpatient rehabilitation, and requiring 24hr supervision. He was seen by myself and found to be medically stable for discharge.  Treatments: Surgery - Coil embolization of basilar apex aneurysm    Past Surgical History  Procedure Laterality Date  . Radiology with anesthesia N/A 10/09/2012    Procedure: RADIOLOGY WITH ANESTHESIA;  Surgeon: Lisbeth Renshaw, MD;  Location: [facility] OR;  Service: Radiology;  Laterality: N/A;  . Aneurysm coiling      for bleed  . Radiology with anesthesia N/A 05/27/2013    Procedure: RADIOLOGY WITH ANESTHESIA;  Surgeon: Lisbeth Renshaw, MD;  Location:  [facility] OR;  Service: Radiology;  Laterality: N/A;   No family history on file. History  Substance Use Topics  . Smoking status: Never Smoker   . Smokeless tobacco: Never Used  . Alcohol Use: No    Review of Systems  All other systems reviewed and are negative.     Allergies  Pork-derived products  Home Medications   Prior to Admission medications   Medication Sig Start Date End Date Taking? Authorizing Provider   aspirin 325 MG EC tablet Take 325 mg by mouth daily.    Historical Provider, MD  clopidogrel (PLAVIX) 75 MG tablet Take 75 mg by mouth daily with breakfast.    Historical Provider, MD  oxyCODONE (OXY IR/ROXICODONE) 5 MG immediate release tablet Take 1 tablet (5 mg total) by mouth every 6 (six) hours as needed for moderate pain. 05/28/13   Lisbeth Renshaw, MD   There were no vitals taken for this visit. Physical Exam  Nursing note and vitals reviewed. Constitutional: He is oriented to person, place, and time. He appears well-developed and well-nourished.  HENT:  Head: Normocephalic and atraumatic.  Right Ear: Tympanic membrane and external ear normal.  Left Ear: Tympanic membrane and external ear normal.  Nose: Nose normal. Right sinus exhibits no maxillary sinus tenderness and no frontal sinus tenderness. Left sinus exhibits no maxillary sinus tenderness and no frontal sinus tenderness.  Eyes: Conjunctivae and EOM are normal. Pupils are equal, round, and reactive to light. Right eye exhibits no nystagmus. Left eye exhibits no nystagmus.  Neck: Normal range of motion. Neck supple.  Cardiovascular: Normal rate, regular rhythm, normal heart sounds and intact distal pulses.   Pulmonary/Chest: Effort normal and breath sounds normal. No respiratory distress. He exhibits no tenderness.  Abdominal: Soft. Bowel sounds are normal. He exhibits no distension and no mass. There is no tenderness.  Musculoskeletal: Normal range of motion. He exhibits no edema and no tenderness.  Neurological: He is alert and oriented to person, place, and time. He has normal strength and normal reflexes. No sensory deficit. He displays a negative Romberg sign. GCS eye subscore is 4. GCS verbal subscore is 5. GCS motor subscore is 6.  Reflex Scores:      Tricep reflexes are 2+ on the right side and 2+ on the left side.      Bicep reflexes are 2+ on the right side and 2+ on the left side.      Brachioradialis reflexes are 2+  on the right side and 2+ on the left side.      Patellar reflexes are 2+ on the right side and 2+ on the left side.      Achilles reflexes are 2+ on the right side and 2+ on the left side. Patient with mild left palmar drift  Patient with normal gait without ataxia, shuffling, spasm, or antalgia. Speech is normal without dysarthria, dysphasia, or aphasia. Muscle strength is 5/5 in bilateral shoulders, elbow flexor and extensors, wrist flexor and extensors, and intrinsic hand muscles. 5/5 bilateral lower extremity hip flexors, extensors, knee flexors and extensors, and ankle dorsi and plantar flexors.    Skin: Skin is warm and dry. No rash noted.  Psychiatric: He has a normal mood and affect. His behavior is normal. Judgment and thought content normal.    ED Course  Procedures (including critical care time) Labs Review Labs Reviewed  I-STAT CHEM 8, ED - Abnormal; Notable for the following:    Creatinine, Ser 1.40 (*)    Glucose, Bld  120 (*)    All other components within normal limits  PROTIME-INR  APTT  CBC  DIFFERENTIAL  COMPREHENSIVE METABOLIC PANEL  ETHANOL  URINE RAPID DRUG SCREEN (HOSP PERFORMED)  URINALYSIS, ROUTINE W REFLEX MICROSCOPIC  I-STAT TROPOININ, ED  I-STAT TROPOININ, ED  I-STAT TROPOININ, ED  CBG MONITORING, ED    Imaging Review No results found.   EKG Interpretation   Date/Time:  Thursday August 26 2013 17:01:49 EDT Ventricular Rate:  87 PR Interval:  170 QRS Duration: 109 QT Interval:  365 QTC Calculation: 439 R Axis:   2 Text Interpretation:  Normal sinus rhythm No significant change since last  tracing Confirmed by Renette Hsu MD, Duwayne HeckANIELLE ([id number]) on 08/26/2013 5:46:28 PM      MDM   Final diagnoses:  Transient cerebral ischemia, unspecified transient cerebral ischemia type  Noncompliance with medications    53 y.o. Male with Crist Infantetia presents with symptoms almost completely resolved.  Not tpa candidate due to low stroke score and recent intracranial  procedure.  Patient stable here.  Initially paged as code stroke.  Seen and consult done by Dr. Amada JupiterKirkpatrick.  He advises tia work up and consult to Dr. Conchita ParisNundkumar.  Dr. Yetta BarreJones returned call and agrees with current plan.      Richard Quarryanielle S Trenity Pha, MD 08/30/13 1250

## 2013-08-26 NOTE — Consult Note (Addendum)
Referring Physician: Ray    Chief Complaint: left arm weakness  HPI:                                                                                                                                         Richard Davenport is an 53 y.o. male with history of basilar aneurysm rupture in 09/2012 followed by both coiling and stenting. Patient was on ASA and Plavix S/P stenting but per family has not taken the Plavix over the last month. Today he was filling his car up around 3:30 when he felt dizzy. The dizziness only lasted for a brief episode and he went on to drive home.  Around 4 PM he noted transient dizziness and weakness in his left arm.  This resolved with in 10 minutes. Currently he is asymptomatic.   Date last known well: Date: 08/26/2013 Time last known well: Time: 15:30 tPA Given: No: resolved Sx and history of SAH NIHSS: 0  Past Medical History  Diagnosis Date  . Headache   . Subarachnoid hemorrhage   . Stroke     Past Surgical History  Procedure Laterality Date  . Radiology with anesthesia N/A 10/09/2012    Procedure: RADIOLOGY WITH ANESTHESIA;  Surgeon: Lisbeth RenshawNeelesh Nundkumar, MD;  [facility];  Service: Radiology;  Laterality: N/A;  . Aneurysm coiling      for bleed  . Radiology with anesthesia N/A 05/27/2013    Procedure: RADIOLOGY WITH ANESTHESIA;  Surgeon: Lisbeth RenshawNeelesh Nundkumar, MD;  [facility];  Service: Radiology;  Laterality: N/A;    Family History  Problem Relation Age of Onset  . Hypertension Mother   . Hypertension Father    Social History:  reports that he has never smoked. He has never used smokeless tobacco. He reports that he does not drink alcohol or use illicit drugs.  Allergies:  Allergies  Allergen Reactions  . Pork-Derived Products     Patient is Muslim and has requested no pork products    Medications:                                                                                                                           No current  facility-administered medications for this encounter.   Current Outpatient Prescriptions  Medication Sig Dispense Refill  . aspirin 325 MG EC tablet Take 325 mg by mouth daily.      .Marland Kitchen  clopidogrel (PLAVIX) 75 MG tablet Take 75 mg by mouth daily with breakfast.      . oxyCODONE (OXY IR/ROXICODONE) 5 MG immediate release tablet Take 1 tablet (5 mg total) by mouth every 6 (six) hours as needed for moderate pain.  60 tablet  0     ROS:                                                                                                                                       History obtained from the patient  General ROS: negative for - chills, fatigue, fever, night sweats, weight gain or weight loss Psychological ROS: negative for - behavioral disorder, hallucinations, memory difficulties, mood swings or suicidal ideation Ophthalmic ROS: negative for - blurry vision, double vision, eye pain or loss of vision ENT ROS: negative for - epistaxis, nasal discharge, oral lesions, sore throat, tinnitus or vertigo Allergy and Immunology ROS: negative for - hives or itchy/watery eyes Hematological and Lymphatic ROS: negative for - bleeding problems, bruising or swollen lymph nodes Endocrine ROS: negative for - galactorrhea, hair pattern changes, polydipsia/polyuria or temperature intolerance Respiratory ROS: negative for - cough, hemoptysis, shortness of breath or wheezing Cardiovascular ROS: negative for - chest pain, dyspnea on exertion, edema or irregular heartbeat Gastrointestinal ROS: negative for - abdominal pain, diarrhea, hematemesis, nausea/vomiting or stool incontinence Genito-Urinary ROS: negative for - dysuria, hematuria, incontinence or urinary frequency/urgency Musculoskeletal ROS: negative for - joint swelling or muscular weakness Neurological ROS: as noted in HPI Dermatological ROS: negative for rash and skin lesion changes  Neurologic Examination:                                                                                                       There were no vitals taken for this visit.   General: NAD Mental Status: Alert, oriented, thought content appropriate.  Speech fluent without evidence of aphasia.  Able to follow 3 step commands without difficulty. Cranial Nerves: II: Discs flat bilaterally; Visual fields grossly normal, pupils equal, round, reactive to light and accommodation III,IV, VI: ptosis not present, extra-ocular motions intact bilaterally V,VII: smile symmetric, facial light touch sensation normal bilaterally VIII: hearing normal bilaterally IX,X: gag reflex present XI: bilateral shoulder shrug XII: midline tongue extension without atrophy or fasciculations  Motor: Right : Upper extremity   5/5    Left:     Upper extremity   5/5  Lower extremity   5/5     Lower extremity   5/5  Tone and bulk:normal tone throughout; no atrophy noted Sensory: Pinprick and light touch intact throughout, bilaterally Deep Tendon Reflexes:  Right: Upper Extremity   Left: Upper extremity   biceps (C-5 to C-6) 2/4   biceps (C-5 to C-6) 2/4 tricep (C7) 2/4    triceps (C7) 2/4 Brachioradialis (C6) 2/4  Brachioradialis (C6) 2/4  Lower Extremity Lower Extremity  quadriceps (L-2 to L-4) 2/4   quadriceps (L-2 to L-4) 2/4 Achilles (S1) 1/4   Achilles (S1) 1/4  Plantars: Right: downgoing   Left: downgoing Cerebellar: normal finger-to-nose,  normal heel-to-shin test Gait: not tested CV: pulses palpable throughout    Lab Results: Basic Metabolic Panel:  Recent Labs Lab 08/26/13 1720  NA 142  K 3.8  CL 105  GLUCOSE 120*  BUN 9  CREATININE 1.40*    Liver Function Tests: No results found for this basename: AST, ALT, ALKPHOS, BILITOT, PROT, ALBUMIN,  in the last 168 hours No results found for this basename: LIPASE, AMYLASE,  in the last 168 hours No results found for this basename: AMMONIA,  in the last 168 hours  CBC:  Recent Labs Lab 08/26/13 1710 08/26/13 1720   WBC 6.0  --   NEUTROABS 3.8  --   HGB 14.7 16.0  HCT 42.4 47.0  MCV 99.5  --   PLT 127*  --     Cardiac Enzymes: No results found for this basename: CKTOTAL, CKMB, CKMBINDEX, TROPONINI,  in the last 168 hours  Lipid Panel: No results found for this basename: CHOL, TRIG, HDL, CHOLHDL, VLDL, LDLCALC,  in the last 168 hours  CBG: No results found for this basename: GLUCAP,  in the last 168 hours  Microbiology: Results for orders placed during the hospital encounter of 10/27/12  URINE CULTURE     Status: None   Collection Time    10/28/12  2:28 PM      Result Value Ref Range Status   Specimen Description URINE, CLEAN CATCH   Final   Special Requests NONE   Final   Culture  Setup Time     Final   Value: 10/28/2012 15:40     Performed at [facility] Count     Final   Value: NO GROWTH     Performed at [facility]   Culture     Final   Value: NO GROWTH     Performed at [facility]   Report Status 10/29/2012 FINAL   Final    Coagulation Studies: No results found for this basename: LABPROT, INR,  in the last 72 hours  Imaging: No results found.     Assessment and plan discussed with with attending physician and they are in agreement.    Felicie Morn PA-C [facility] [phone]  08/26/2013, 5:29 PM   Assessment: 53 y.o. male presenting to ED with episode of dizziness and transient left arm weakness.  At this point Sx have resolved. I suspect TIA based on the unilateral weakness. It is certainly possible that this is related to his stent and plavix non-compliance, but it is impossible to rule out anterior circulation or small vessel disease as an etiology for his symptoms and I would favor risk stratification.   Stroke Risk Factors - none   1. HgbA1c, fasting lipid panel 2. MRI, MRA  of the brain without contrast 3. PT consult, OT consult, Speech consult 4. Echocardiogram 5. Carotid dopplers 6. Prophylactic  therapy-Antiplatelet med: Aspirin - dose 325 daily and  Antiplatelet med: Plavix - dose 75 daily. Given multiple events today would load with plavix.  7. Risk factor modification 8. Telemetry monitoring 9. Frequent neuro checks  10. Would discuss with Dr. Conchita Paris plans for his stent.  Ritta Slot, MD [facility] [phone]  If 7pm- 7am, please page neurology on call as listed in AMION.

## 2013-08-26 NOTE — H&P (Signed)
PATIENT DETAILS Name: Richard Davenport Age: 53 y.o. Sex: male Date of Birth: 1960/06/29 Admit Date: 08/26/2013 PCP:No PCP Per Patient  CHIEF COMPLAINT:  Dizziness, left arm weakness- earlier this afternoon-now completely resolved the  HPI: Richard Davenport is a 53 y.o. 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 Patient was placed on both aspirin and Plavix status post stenting, however for the past one month he has not been compliant with Plavix. Approximately around 3:30 while filling up his car with gas he noted that he was dizzy this lasted approximately 5 minutes. Around 4 PM, he again had an dizziness and left arm weakness that also lasted approximately 5-10 minutes. His family then brought him to the emergency room for further evaluation and treatment. Patient denies any speech difficulty, visual problems with these symptoms. A CT scan of the head done in the emergency room was negative for acute abnormalities. Since patient's symptoms all resolved, I was asked to admit this patient for further evaluation and treatment. Patient denies any headache, fever, chest pain, shortness of breath, nausea, vomiting or diarrhea.  ALLERGIES:   Allergies  Allergen Reactions  . Pork-Derived Products     Patient is Muslim and has requested no pork products    PAST MEDICAL HISTORY: Past Medical History  Diagnosis Date  . Headache   . Subarachnoid hemorrhage   . Stroke     PAST SURGICAL HISTORY: Past Surgical History  Procedure Laterality Date  . Radiology with anesthesia N/A 10/09/2012    Procedure: RADIOLOGY WITH ANESTHESIA;  Surgeon: Lisbeth Renshaw, MD;  Location: [facility] OR;  Service: Radiology;  Laterality: N/A;  . Aneurysm coiling      for bleed  . Radiology with anesthesia N/A 05/27/2013    Procedure: RADIOLOGY WITH ANESTHESIA;  Surgeon: Lisbeth Renshaw, MD;  Location: [facility] OR;  Service: Radiology;  Laterality: N/A;    MEDICATIONS AT HOME: Prior to Admission medications   Medication Sig Start Date End Date Taking? Authorizing Provider  aspirin 325 MG EC tablet Take 325 mg by mouth daily.    Historical Provider, MD  clopidogrel (PLAVIX) 75 MG tablet Take 75 mg by mouth daily with breakfast.    Historical Provider, MD  oxyCODONE (OXY IR/ROXICODONE) 5 MG immediate release tablet Take 1 tablet (5 mg total) by mouth every 6 (six) hours as needed for moderate pain. 05/28/13   Lisbeth Renshaw, MD    FAMILY HISTORY: Family History  Problem Relation Age of Onset  . Hypertension Mother   . Hypertension Father     SOCIAL HISTORY:  reports that he has never smoked. He has never used smokeless tobacco. He reports that he does not drink alcohol or use illicit drugs.  REVIEW OF SYSTEMS:  Constitutional:   No  weight loss, night sweats,  Fevers, chills, fatigue.  HEENT:    No headaches, Difficulty swallowing,Tooth/dental problems,Sore throat,  No sneezing, itching, ear ache, nasal congestion, post nasal drip,   Cardio-vascular: No chest pain,  Orthopnea, PND, swelling in lower extremities, anasarca,         dizziness, palpitations  GI:  No heartburn, indigestion, abdominal pain, nausea, vomiting, diarrhea, change in       bowel habits, loss of appetite  Resp: No shortness of breath with exertion or at rest.  No excess mucus, no productive cough, No non-productive cough,  No coughing  up of blood.No change in color of mucus.No wheezing.No chest wall deformity  Skin:  no rash or lesions.  GU:  no dysuria, change in color of urine, no urgency or frequency.  No flank pain.  Musculoskeletal: No joint pain or swelling.  No decreased range of motion.  No back pain.  Psych: No change in mood or affect. No depression or anxiety.  No memory loss.   PHYSICAL EXAM: Blood pressure 136/82, pulse 66, temperature 98.1 F (36.7 C),  temperature source Oral, resp. rate 15, SpO2 99.00%.  General appearance :Awake, alert, not in any distress. Speech Clear. Not toxic Looking HEENT: Atraumatic and Normocephalic, pupils equally reactive to light and accomodation Neck: supple, no JVD. No cervical lymphadenopathy.  Chest:Good air entry bilaterally, no added sounds  CVS: S1 S2 regular, no murmurs.  Abdomen: Bowel sounds present, Non tender and not distended with no gaurding, rigidity or rebound. Extremities: B/L Lower Ext shows no edema, both legs are warm to touch Neurology: Awake alert, and oriented X 3, CN II-XII intact, Non focal Skin:No Rash Wounds:N/A  LABS ON ADMISSION:   Recent Labs  08/26/13 1710 08/26/13 1720  NA 142 142  K 3.9 3.8  CL 106 105  CO2 24  --   GLUCOSE 117* 120*  BUN 10 9  CREATININE 1.17 1.40*  CALCIUM 9.2  --     Recent Labs  08/26/13 1710  AST 26  ALT 20  ALKPHOS 108  BILITOT 0.5  PROT 7.8  ALBUMIN 3.9   No results found for this basename: LIPASE, AMYLASE,  in the last 72 hours  Recent Labs  08/26/13 1710 08/26/13 1720  WBC 6.0  --   NEUTROABS 3.8  --   HGB 14.7 16.0  HCT 42.4 47.0  MCV 99.5  --   PLT 127*  --    No results found for this basename: CKTOTAL, CKMB, CKMBINDEX, TROPONINI,  in the last 72 hours No results found for this basename: DDIMER,  in the last 72 hours No components found with this basename: POCBNP,    RADIOLOGIC STUDIES ON ADMISSION: Ct Head (brain) Wo Contrast  08/26/2013   CLINICAL DATA:  Code stroke.  Confusion.  Left-sided weakness.  EXAM: CT HEAD WITHOUT CONTRAST  TECHNIQUE: Contiguous axial images were obtained from the base of the skull through the vertex without intravenous contrast.  COMPARISON:  Head CT 10/22/2012.  FINDINGS: Previously noted right frontal ventriculostomy shunt catheter has been removed. No evidence of hydrocephalus. Multiple embolization coils are noted in the expected location of the tip of the basilar artery. Faint  physiologic calcifications are noted in the basal ganglia bilaterally. No acute intracranial abnormalities. Specifically, no evidence of acute intracranial hemorrhage, no definite findings of acute/subacute cerebral ischemia, no mass, mass effect, hydrocephalus or abnormal intra or extra-axial fluid collections. No acute displaced skull fractures are identified. Mastoids are well pneumatized bilaterally. Mild mucosal thickening in the right maxillary sinus an extensive multifocal opacification of the ethmoid sinuses bilaterally. The frontal sinus and left maxillary sinus are well pneumatized.  IMPRESSION: 1. No acute intracranial abnormalities. 2. Status post removal right frontal ventriculostomy shunt catheter compared to the prior study, without evidence of hydrocephalus. 3. Additional incidental findings, as above, similar to the prior examination. These results were called by telephone at the time of interpretation on 08/26/2013 at 5:32 pm to Dr. Amada Jupiter, who verbally acknowledged these results.   Electronically Signed   By: Trudie Reed M.D.   On: 08/26/2013 17:33  EKG: Independently reviewed. Normal sinus rhythm  ASSESSMENT AND PLAN: Present on Admission:  . TIA (transient ischemic attack) - Patient will be admitted to telemetry unit, he will be restarted on both aspirin and Plavix neurology has already been consulted. Further workup will be initiated with a MRI/MRA brain, echocardiogram and carotid Doppler.A1c and lipid panel will be checked. Further plan will depend as patient's clinical course evolves, further radiologic studies obtained, and further input from the stroke team. - Currently patient is stable, all of his presenting symptoms have resolved.  . 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 ED M.D. has  spoken with neurosurgery (Dr. Yetta BarreJones), neurosurgery currently has no further input at this point.  Further plan will depend as patient's clinical course evolves and further radiologic and laboratory data become available. Patient will be monitored closely.  Above noted plan was discussed with patient/spouse, they were in agreement.   DVT Prophylaxis: Prophylactic  Heparin  Code Status: Full Code  Total time spent for admission equals 45 minutes.  Mills-Peninsula Medical CenterGHIMIRE,SHANKER [facility] Pager [phone]  If 7PM-7AM, please contact night-coverage [email or web address] Password [email or web address] 08/26/2013, 8:06 PM  **Disclaimer: This note may have been dictated with voice recognition software. Similar sounding words can inadvertently be transcribed and this note may contain transcription errors which may not have been corrected upon publication of note.**

## 2013-08-26 NOTE — ED Notes (Addendum)
Pt reports at 330pm today he was in the car when he had a sudden onset of dizziness, slurred speech and left sided weakness. Reports completely normal before that. Hx of stroke in the past. Witnessed by daughter, symptoms lasted for about 5 mins. Dr. Rosalia Hammersay at bedside.

## 2013-08-26 NOTE — ED Notes (Signed)
Pt returned from MRI °

## 2013-08-27 DIAGNOSIS — G459 Transient cerebral ischemic attack, unspecified: Secondary | ICD-10-CM

## 2013-08-27 DIAGNOSIS — R42 Dizziness and giddiness: Secondary | ICD-10-CM | POA: Diagnosis present

## 2013-08-27 DIAGNOSIS — Z91199 Patient's noncompliance with other medical treatment and regimen due to unspecified reason: Secondary | ICD-10-CM

## 2013-08-27 DIAGNOSIS — Z9119 Patient's noncompliance with other medical treatment and regimen: Secondary | ICD-10-CM

## 2013-08-27 DIAGNOSIS — N179 Acute kidney failure, unspecified: Secondary | ICD-10-CM

## 2013-08-27 HISTORY — DX: Acute kidney failure, unspecified: N17.9

## 2013-08-27 LAB — URINALYSIS, ROUTINE W REFLEX MICROSCOPIC
BILIRUBIN URINE: NEGATIVE
Glucose, UA: NEGATIVE mg/dL
HGB URINE DIPSTICK: NEGATIVE
Ketones, ur: NEGATIVE mg/dL
Leukocytes, UA: NEGATIVE
Nitrite: NEGATIVE
Protein, ur: NEGATIVE mg/dL
SPECIFIC GRAVITY, URINE: 1.019 (ref 1.005–1.030)
UROBILINOGEN UA: 1 mg/dL (ref 0.0–1.0)
pH: 6 (ref 5.0–8.0)

## 2013-08-27 LAB — RAPID URINE DRUG SCREEN, HOSP PERFORMED
AMPHETAMINES: NOT DETECTED
Barbiturates: NOT DETECTED
Benzodiazepines: NOT DETECTED
COCAINE: NOT DETECTED
OPIATES: NOT DETECTED
Tetrahydrocannabinol: NOT DETECTED

## 2013-08-27 LAB — HEMOGLOBIN A1C
Hgb A1c MFr Bld: 5.8 % — ABNORMAL HIGH (ref <5.7)
Hgb A1c MFr Bld: 5.7 % — ABNORMAL HIGH (ref <5.7)
MEAN PLASMA GLUCOSE: 117 mg/dL — AB (ref <117)
Mean Plasma Glucose: 120 mg/dL — ABNORMAL HIGH (ref <117)

## 2013-08-27 LAB — LIPID PANEL
Cholesterol: 153 mg/dL (ref 0–200)
HDL: 44 mg/dL (ref >39)
LDL CALC: 37 mg/dL (ref 0–99)
Total CHOL/HDL Ratio: 3.5 RATIO
Triglycerides: 361 mg/dL — ABNORMAL HIGH (ref <150)
VLDL: 72 mg/dL — ABNORMAL HIGH (ref 0–40)

## 2013-08-27 LAB — GLUCOSE, CAPILLARY
GLUCOSE-CAPILLARY: 110 mg/dL — AB (ref 70–99)
GLUCOSE-CAPILLARY: 104 mg/dL — AB (ref 70–99)

## 2013-08-27 LAB — CREATININE, URINE, RANDOM: Creatinine, Urine: 112.94 mg/dL

## 2013-08-27 LAB — SODIUM, URINE, RANDOM: SODIUM UR: 203 meq/L

## 2013-08-27 MED ORDER — ASPIRIN 81 MG PO TBEC: 81.0000 mg | DELAYED_RELEASE_TABLET | Freq: Every day | ORAL | Status: DC

## 2013-08-27 MED ORDER — FENOFIBRATE 54 MG PO TABS: 54.0000 mg | ORAL_TABLET | Freq: Every day | ORAL | Status: DC

## 2013-08-27 MED ORDER — CLOPIDOGREL BISULFATE 75 MG PO TABS
75.0000 mg | ORAL_TABLET | Freq: Every day | ORAL | Status: DC
Start: 2013-08-27 — End: 2015-07-09

## 2013-08-27 MED ORDER — SODIUM CHLORIDE 0.9 % IV BOLUS (SEPSIS)
1000.0000 mL | Freq: Once | INTRAVENOUS | Status: AC
  Administered 2013-08-27: 1000 mL via INTRAVENOUS

## 2013-08-27 MED ORDER — ASPIRIN EC 81 MG PO TBEC: 81.0000 mg | DELAYED_RELEASE_TABLET | Freq: Every day | ORAL | Status: DC

## 2013-08-27 MED ORDER — ATORVASTATIN CALCIUM 40 MG PO TABS
40.0000 mg | ORAL_TABLET | Freq: Every day | ORAL | Status: DC
  Administered 2013-08-27: 40 mg via ORAL
  Filled 2013-08-27: qty 1

## 2013-08-27 MED ORDER — ATORVASTATIN CALCIUM 40 MG PO TABS: 40.0000 mg | ORAL_TABLET | Freq: Every day | ORAL | Status: DC

## 2013-08-27 NOTE — Evaluation (Signed)
Physical Therapy Evaluation Patient Details Name: Imogene Burnahir Wohlfarth MRN: [id number] DOB: Apr 11, 1960 Today's Date: 08/27/2013   History of Present Illness  Patient is a 53 yo male admitted 08/26/13 with transient LUE weakness and dizziness.  Patient having stroke work-up.  NIHSS = 0.  MRI - Subcentimeter area of restricted diffusion LEFT anterior medial thalamus consistent with a small acute infarct.  PMH:  aneurysm rupture 2014 with coiling/stenting.  Clinical Impression  Patient is independent with all mobility and gait.  Strength is symmetrical.  Patient scored 24/24 on DGI balance assessment.  Provided patient and daughter with education on stroke warning signs and risk factors.  Patient verbalized understanding.  No further acute PT needs - PT will sign off.    Follow Up Recommendations No PT follow up;Supervision - Intermittent    Equipment Recommendations  None recommended by PT    Recommendations for Other Services       Precautions / Restrictions Precautions Precautions: None Restrictions Weight Bearing Restrictions: No      Mobility  Bed Mobility Overal bed mobility: Independent                Transfers Overall transfer level: Independent Equipment used: None                Ambulation/Gait Ambulation/Gait assistance: Independent Ambulation Distance (Feet): 300 Feet Assistive device: None Gait Pattern/deviations: WFL(Within Functional Limits) Gait velocity: WFL Gait velocity interpretation: at or above normal speed for age/gender General Gait Details: Patient with good gait pattern, balance, and speed.  Stairs Stairs: Yes Stairs assistance: Independent Stair Management: No rails;Alternating pattern;Forwards Number of Stairs: 3    Wheelchair Mobility    Modified Rankin (Stroke Patients Only) Modified Rankin (Stroke Patients Only) Pre-Morbid Rankin Score: No symptoms Modified Rankin: No symptoms     Balance                                  Standardized Balance Assessment Standardized Balance Assessment : Dynamic Gait Index   Dynamic Gait Index Level Surface: Normal Change in Gait Speed: Normal Gait with Horizontal Head Turns: Normal Gait with Vertical Head Turns: Normal Gait and Pivot Turn: Normal Step Over Obstacle: Normal Step Around Obstacles: Normal Steps: Normal Total Score: 24       Pertinent Vitals/Pain Pain Assessment: No/denies pain    Home Living Family/patient expects to be discharged to:: Private residence Living Arrangements: Spouse/significant other;Children Available Help at Discharge: Family;Available 24 hours/day Type of Home: Apartment Home Access: Level entry     Home Layout: One level Home Equipment: None      Prior Function Level of Independence: Independent               Hand Dominance        Extremity/Trunk Assessment   Upper Extremity Assessment: Overall WFL for tasks assessed           Lower Extremity Assessment: Overall WFL for tasks assessed (strength symmetrical)      Cervical / Trunk Assessment: Normal  Communication   Communication: No difficulties  Cognition Arousal/Alertness: Awake/alert Behavior During Therapy: WFL for tasks assessed/performed Overall Cognitive Status: Within Functional Limits for tasks assessed                      General Comments      Exercises        Assessment/Plan    PT Assessment Patent does not need  any further PT services  PT Diagnosis     PT Problem List    PT Treatment Interventions     PT Goals (Current goals can be found in the Care Plan section) Acute Rehab PT Goals PT Goal Formulation: No goals set, d/c therapy    Frequency     Barriers to discharge        Co-evaluation               End of Session   Activity Tolerance: Patient tolerated treatment well Patient left: in bed;with call bell/phone within reach;with family/visitor present Nurse Communication: Mobility status     Functional Assessment Tool Used: Clinical judgement; DGI balance assessment Functional Limitation: Mobility: Walking and moving around Mobility: Walking and Moving Around Current Status ([id number]): 0 percent impaired, limited or restricted Mobility: Walking and Moving Around Goal Status [phone]): 0 percent impaired, limited or restricted Mobility: Walking and Moving Around Discharge Status [phone]): 0 percent impaired, limited or restricted    Time: 9147-8295 PT Time Calculation (min): 13 min   Charges:   PT Evaluation $Initial PT Evaluation Tier I: 1 Procedure PT Treatments $Gait Training: 8-22 mins   PT G Codes:   Functional Assessment Tool Used: Clinical judgement; DGI balance assessment Functional Limitation: Mobility: Walking and moving around    Vena Austria 08/27/2013, 12:56 PM  Durenda Hurt. Renaldo Fiddler, [facility] Acute Rehab Services Pager [phone]

## 2013-08-27 NOTE — Progress Notes (Signed)
Patient d/c this evening. Assessments remained unchanged prior to d/c. Instructions and prescription given to him . Walked out accompanied with family.

## 2013-08-27 NOTE — Progress Notes (Signed)
  Echocardiogram 2D Echocardiogram has been performed.  Richard Davenport [name] 08/27/2013, 9:23 AM

## 2013-08-27 NOTE — Progress Notes (Signed)
Stroke Team Progress Note  HISTORY Richard Davenport is an 53 y.o. male with history of basilar aneurysm rupture in 09/2012 followed by both coiling and stenting. Patient was on ASA and Plavix S/P stenting but per family has not taken the Plavix over the last month. Today 08/26/2013 he was filling his car up around 3:30 when he felt dizzy. The dizziness only lasted for a brief episode and he went on to drive home. Around 4 PM he noted transient dizziness and weakness in his left arm. This resolved with in 10 minutes. Currently he is asymptomatic. Patient was not administered TPA secondary to resolved Sx, NIHSS 0 and history of SAH. He was admitted for further evaluation and treatment.  SUBJECTIVE His daughter is at the bedside.  Overall he feels his condition is stable. Patient is sitting up at the bedside. No more episodes. He stated for his episodes he felt dizziness which was lightheadedness but not vertigo, with heaviness of left arm. Lasting 5-10 seconds each. He suppose to take ASA and plavix but he has not taken plavix for the last 2 months. basiliar tip aneurysm was coiled in 09/2012 and stent was put in 05/2013. He tolerated well and on ASA and plavix for a month but did not feel the plavix after.   OBJECTIVE Most recent Vital Signs: Filed Vitals:   08/27/13 0700 08/27/13 1020 08/27/13 1440 08/27/13 1644  BP: 106/69 130/81 120/67 127/79  Pulse: 71 70 80 63  Temp: 97.9 F (36.6 C) 97.8 F (36.6 C) 98 F (36.7 C) 97.9 F (36.6 C)  TempSrc: Oral Oral Axillary Oral  Resp: 18 20 20 20   Height:      Weight:      SpO2: 94% 99% 98% 98%   CBG (last 3)   Recent Labs  08/26/13 2201 08/27/13 1142 08/27/13 1639  GLUCAP 92 110* 104*    IV Fluid Intake:    MEDICATIONS   PRN:      Diet:    thin liquids Activity:   Bathroom privileges with assistance DVT Prophylaxis:  None, patient ambulatory independently  CLINICALLY SIGNIFICANT STUDIES Basic Metabolic Panel:   Recent Labs Lab  08/26/13 1710 08/26/13 1720  NA 142 142  K 3.9 3.8  CL 106 105  CO2 24  --   GLUCOSE 117* 120*  BUN 10 9  CREATININE 1.17 1.40*  CALCIUM 9.2  --    Liver Function Tests:   Recent Labs Lab 08/26/13 1710  AST 26  ALT 20  ALKPHOS 108  BILITOT 0.5  PROT 7.8  ALBUMIN 3.9   CBC:   Recent Labs Lab 08/26/13 1710 08/26/13 1720  WBC 6.0  --   NEUTROABS 3.8  --   HGB 14.7 16.0  HCT 42.4 47.0  MCV 99.5  --   PLT 127*  --    Coagulation:   Recent Labs Lab 08/26/13 1710  LABPROT 17.4*  INR 1.42   Cardiac Enzymes: No results found for this basename: CKTOTAL, CKMB, CKMBINDEX, TROPONINI,  in the last 168 hours Urinalysis:   Recent Labs Lab 08/27/13 1143  COLORURINE YELLOW  LABSPEC 1.019  PHURINE 6.0  GLUCOSEU NEGATIVE  HGBUR NEGATIVE  BILIRUBINUR NEGATIVE  KETONESUR NEGATIVE  PROTEINUR NEGATIVE  UROBILINOGEN 1.0  NITRITE NEGATIVE  LEUKOCYTESUR NEGATIVE    Lipid Panel     Component Value Date/Time   CHOL 153 08/27/2013 0758   TRIG 361* 08/27/2013 0758   HDL 44 08/27/2013 0758   CHOLHDL 3.5 08/27/2013 0758  VLDL 72* 08/27/2013 0758   LDLCALC 37 08/27/2013 0758   HgbA1C  Lab Results  Component Value Date   HGBA1C 5.7* 08/27/2013    Urine Drug Screen:      Component Value Date/Time   LABOPIA NONE DETECTED 08/27/2013 1143    Alcohol Level:   Recent Labs Lab 08/26/13 1710  ETH <11    CT of the brain  08/26/2013   1. No acute intracranial abnormalities. 2. Status post removal right frontal ventriculostomy shunt catheter compared to the prior study, without evidence of hydrocephalus. 3. Additional incidental findings, as above, similar to the prior examination.   MRI of the brain  08/26/2013    Subcentimeter area of restricted diffusion LEFT anterior medial thalamus consistent with a small acute infarct.    MRA of the brain  08/26/2013     No basilar tip aneurysm filling is observed on MRA. Signal dropout surrounds the areas of stent placement.    Carotid Doppler   No evidence of hemodynamically significant internal carotid artery stenosis. Vertebral artery flow is antegrade.   2D Echocardiogram  EF 60-65% with no source of embolus.   EKG  normal sinus rhythm. For complete results please see formal report.   Therapy Recommendations no therapy needs  Physical Exam General - Well nourished, well developed, in no apparent distress.  Mental Status -  Level of arousal and orientation to time, place, and person were intact. Language including expression, naming, repetition, comprehension, reading, and writing was assessed and found intact. Attention span and concentration were normal. Recent and remote memory were intact. Fund of Knowledge was assessed and was intact.  Cranial Nerves II - XII - II - Vision intact OU. III, IV, VI - Extraocular movements intact. V - Facial sensation intact bilaterally. VII - Facial movement intact bilaterally. VIII - Hearing & vestibular intact bilaterally. X - Palate elevates symmetrically. XI - Chin turning & shoulder shrug intact bilaterally. XII - Tongue protrusion intact.  Motor Strength - The patient's strength was normal in all extremities and pronator drift was absent.  Bulk was normal and fasciculations were absent.   Motor Tone - Muscle tone was assessed at the neck and appendages and was normal.  Reflexes - The patient's reflexes were normal in all extremities and he had no pathological reflexes.  Sensory - Light touch, temperature/pinprick, vibration and proprioception, and Romberg testing were assessed and were normal.    Coordination - The patient had normal movements in the hands and feet with no ataxia or dysmetria.  Tremor was absent.  Gait and Station - The patient's transfers, posture, gait, station, and turns were observed as normal.   ASSESSMENT [name] is a 53 y.o. male with Hx SAH s/p basilar aneurysm rupture in 09/2012 followed by coiling at that time, then in May 2015 stenting for  recurrent basilar aneurysm in presenting with 5-10 seconds of left arm weakness and dizziness. Imaging suggests an unrelated/incidental left thalamic infarct vs artifact from recent stent. Also it is at left side of the brain which did not fit to pt symptoms. Upon discussion between Dr. Roda Shutters and radiology, likely artifact from stent placement, however, cannot rule out acute infarct. On aspirin 325 mg orally every day prior to admission; not taking his plavix as prescirbed. Now on aspirin 325 mg orally every day and clopidogrel 75 mg orally every day post plavix 300 mg load for secondary stroke prevention. Patient with no resultant neuro deficitis. Stroke work up underway.   LDL  37  A1C 5.7  [facility]  TREATMENT/PLAN  Continue aspirin 81 mg orally every day and clopidogrel 75 mg orally every day for secondary stroke prevention (I changed dose of aspirin)  MRI DWI lesion felt to be artifact and not fit to pt symptoms  May consider repeat MRI in the future to follow up with DWI signal  follow up with Dr. Conchita ParisNundkumar next week  Did not find significant stroke risk factor  OK from neurology standpoint to be discharged.  Annie MainSHARON BIBY, MSN, RN, ANVP-BC, ANP-BC, Lawernce IonGNP-BC [facility] Pager: [phone] 08/27/2013 10:48 PM   SIGNED I, the attending vascular neurologist, have personally obtained a history, examined the patient, evaluated laboratory data, individually viewed imaging studies, and formulated the assessment and plan of care.  I have made any additions or clarifications directly to the above note and agree with the findings and plan as currently documented.   Marvel PlanJindong Moneisha Vosler, MD PhD Stroke Neurology 08/27/2013 10:53 PM      To contact Stroke Continuity provider, please refer to [email or web address]. After hours, contact General Neurology

## 2013-08-27 NOTE — Discharge Summary (Signed)
Physician Discharge Summary  Richard Davenport ZOX:[id number] DOB: 1960-11-17 DOA: 08/26/2013  PCP: No PCP Per Patient  Admit date: 08/26/2013 Discharge date: 08/27/2013  Time spent: 35 minutes  Recommendations for Outpatient Follow-up:  1. Follow up with DR. Nundkumar address antiplatelet therapy    Discharge Diagnoses:  Active Problems:   TIA (transient ischemic attack)   AKI (acute kidney injury)   Discharge Condition: stable  Diet recommendation: heart healthy  Filed Weights   08/27/13 0305  Weight: 86.728 kg (191 lb 3.2 oz)    History of present illness:  53 y.o. 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 Patient was placed on both aspirin and Plavix status post stenting, however for the past one month he has not been compliant with Plavix. Approximately around 3:30 while filling up his car with gas he noted that he was dizzy this lasted approximately 5 minutes. Around 4 PM, he again had an dizziness and left arm weakness that also lasted approximately 5-10 minutes. His family then brought him to the emergency room for further evaluation and treatment. Patient denies any speech difficulty, visual problems with these symptoms. A CT scan of the head done in the emergency room was negative for acute abnormalities. Since patient's symptoms all resolved, I was asked to admit this patient for further evaluation and treatment   Hospital Course:  Dizziness: - No tPA give.  - Neurology consulted - HgbA1c, fasting lipid panel  - MRI, MRA of the brain showed artifact, no acute stroke. - PT consult, OT consult, Speech consult Pending  - Echocardiogram ejection fraction was in the range of 60% to 65%. Wall motion was normal; there were no regional wall motion abnormalities. - Carotid dopplers as below  - Prophylactic therapy-Antiplatelet med: at home  on Aspirin and plavix, has not bee taking his plavix for 1 month.   AKI:  - Most likely pre-renal and ibuprofen use. - stop ibuprofen.   Procedures:  Carotid doppler 8.7.2015: Findings consistent with 1-39% stenosis involving the right internal carotid artery   ECHO  MRI brain  Consultations:  Neurology  Discharge Exam: Filed Vitals:   08/27/13 1440  BP: 120/67  Pulse: 80  Temp: 98 F (36.7 C)  Resp: 20    General: see progress ntoe   Discharge Instructions You were cared for by a hospitalist during your hospital stay. If you have any questions about your discharge medications or the care you received while you were in the hospital after you are discharged, you can call the unit and asked to speak with the hospitalist on call if the hospitalist that took care of you is not available. Once you are discharged, your primary care physician will handle any further medical issues. Please note that NO REFILLS for any discharge medications will be authorized once you are discharged, as it is imperative that you return to your primary care physician (or establish a relationship with a primary care physician if you do not have one) for your aftercare needs so that they can reassess your need for medications and monitor your lab values.  Discharge Instructions   Diet - low sodium heart healthy    Complete by:  As directed      Increase activity slowly    Complete by:  As directed             Medication List  aspirin 81 MG EC tablet  Take 1 tablet (81 mg total) by mouth daily.  Start taking on:  08/28/2013     atorvastatin 40 MG tablet  Commonly known as:  LIPITOR  Take 1 tablet (40 mg total) by mouth daily at 6 PM.     clopidogrel 75 MG tablet  Commonly known as:  PLAVIX  Take 1 tablet (75 mg total) by mouth daily with breakfast.     ibuprofen 200 MG tablet  Commonly known as:  ADVIL,MOTRIN  Take 400 mg by mouth every 6 (six) hours as needed for moderate pain.        Allergies  Allergen Reactions  . Pork-Derived Products Other (See Comments)    Patient is Muslim and PREFERS TO NOT TAKE ANY PORK OR MEAT PRODUCTS (only fish)       Follow-up Information   Follow up with NUNDKUMAR, NEELESH, C, MD In 2 weeks. (hospital follow up)    Specialty:  Neurosurgery   Contact information:   [address] [place] [place] [address] [phone]        The results of significant diagnostics from this hospitalization (including imaging, microbiology, ancillary and laboratory) are listed below for reference.    Significant Diagnostic Studies: Ct Head (brain) Wo Contrast  08/26/2013   CLINICAL DATA:  Code stroke.  Confusion.  Left-sided weakness.  EXAM: CT HEAD WITHOUT CONTRAST  TECHNIQUE: Contiguous axial images were obtained from the base of the skull through the vertex without intravenous contrast.  COMPARISON:  Head CT 10/22/2012.  FINDINGS: Previously noted right frontal ventriculostomy shunt catheter has been removed. No evidence of hydrocephalus. Multiple embolization coils are noted in the expected location of the tip of the basilar artery. Faint physiologic calcifications are noted in the basal ganglia bilaterally. No acute intracranial abnormalities. Specifically, no evidence of acute intracranial hemorrhage, no definite findings of acute/subacute cerebral ischemia, no mass, mass effect, hydrocephalus or abnormal intra or extra-axial fluid collections. No acute displaced skull fractures are identified. Mastoids are well pneumatized bilaterally. Mild mucosal thickening in the right maxillary sinus an extensive multifocal opacification of the ethmoid sinuses bilaterally. The frontal sinus and left maxillary sinus are well pneumatized.  IMPRESSION: 1. No acute intracranial abnormalities. 2. Status post removal right frontal ventriculostomy shunt catheter compared to the prior study, without evidence of hydrocephalus. 3. Additional incidental  findings, as above, similar to the prior examination. These results were called by telephone at the time of interpretation on 08/26/2013 at 5:32 pm to Dr. Amada Jupiter, who verbally acknowledged these results.   Electronically Signed   By: Trudie Reed M.D.   On: 08/26/2013 17:33   Mr Maxine Glenn Head Wo Contrast  08/26/2013   CLINICAL DATA:  History of basilar tip aneurysm rupture with subsequent coiling and stenting in Sept. 2014. Patient recently discontinued Plavix. Onset of dizziness at 1630 hr today. LEFT arm weakness transient, now resolved.  EXAM: MRI HEAD WITHOUT CONTRAST  MRA HEAD WITHOUT CONTRAST  TECHNIQUE: Multiplanar, multiecho pulse sequences of the brain and surrounding structures were obtained without intravenous contrast. Angiographic images of the head were obtained using MRA technique without contrast.  COMPARISON:  CT head earlier today. Cerebral angiography most recent 05/27/2013. CT angio head 10/09/2012.  FINDINGS: MRI HEAD FINDINGS  There is a subcentimeter area of restricted diffusion in the LEFT anterior medial thalamus which I believe represents a small area of acute infarction. It is sufficiently distant from the coil mass, and not seen on the RIGHT, so  that artifact is deemed less likely. No restricted diffusion in the RIGHT hemisphere or brainstem.  Normal cerebral volume. Minor periventricular greater than subcortical white matter signal abnormality suggesting early small vessel disease. Blooming on MPGR images related to the basilar tip aneurysm stent and coiling. Dolichoectasia with preserved flow voids. No midline abnormality. Chronic sinus disease particularly RIGHT maxillary and BILATERAL ethmoid. Minor BILATERAL mastoid fluid. Negative orbits.  MRA HEAD FINDINGS  Internal carotid arteries widely patent. Distal basilar artery attenuated by signal loss from the stent. No definite basilar tip aneurysmal filling. Signal dropout affects both P1 PCA segments. No ACA or MCA stenosis. No  other aneurysms are observed.  IMPRESSION: Subcentimeter area of restricted diffusion LEFT anterior medial thalamus consistent with a small acute infarct.  No basilar tip aneurysm filling is observed on MRA. Signal dropout surrounds the areas of stent placement.   Electronically Signed   By: Davonna Belling M.D.   On: 08/26/2013 20:14   Mr Brain Wo Contrast  08/26/2013   CLINICAL DATA:  History of basilar tip aneurysm rupture with subsequent coiling and stenting in Sept. 2014. Patient recently discontinued Plavix. Onset of dizziness at 1630 hr today. LEFT arm weakness transient, now resolved.  EXAM: MRI HEAD WITHOUT CONTRAST  MRA HEAD WITHOUT CONTRAST  TECHNIQUE: Multiplanar, multiecho pulse sequences of the brain and surrounding structures were obtained without intravenous contrast. Angiographic images of the head were obtained using MRA technique without contrast.  COMPARISON:  CT head earlier today. Cerebral angiography most recent 05/27/2013. CT angio head 10/09/2012.  FINDINGS: MRI HEAD FINDINGS  There is a subcentimeter area of restricted diffusion in the LEFT anterior medial thalamus which I believe represents a small area of acute infarction. It is sufficiently distant from the coil mass, and not seen on the RIGHT, so that artifact is deemed less likely. No restricted diffusion in the RIGHT hemisphere or brainstem.  Normal cerebral volume. Minor periventricular greater than subcortical white matter signal abnormality suggesting early small vessel disease. Blooming on MPGR images related to the basilar tip aneurysm stent and coiling. Dolichoectasia with preserved flow voids. No midline abnormality. Chronic sinus disease particularly RIGHT maxillary and BILATERAL ethmoid. Minor BILATERAL mastoid fluid. Negative orbits.  MRA HEAD FINDINGS  Internal carotid arteries widely patent. Distal basilar artery attenuated by signal loss from the stent. No definite basilar tip aneurysmal filling. Signal dropout affects  both P1 PCA segments. No ACA or MCA stenosis. No other aneurysms are observed.  IMPRESSION: Subcentimeter area of restricted diffusion LEFT anterior medial thalamus consistent with a small acute infarct.  No basilar tip aneurysm filling is observed on MRA. Signal dropout surrounds the areas of stent placement.   Electronically Signed   By: Davonna Belling M.D.   On: 08/26/2013 20:14    Microbiology: No results found for this or any previous visit (from the past 240 hour(s)).   Labs: Basic Metabolic Panel:  Recent Labs Lab 08/26/13 1710 08/26/13 1720  NA 142 142  K 3.9 3.8  CL 106 105  CO2 24  --   GLUCOSE 117* 120*  BUN 10 9  CREATININE 1.17 1.40*  CALCIUM 9.2  --    Liver Function Tests:  Recent Labs Lab 08/26/13 1710  AST 26  ALT 20  ALKPHOS 108  BILITOT 0.5  PROT 7.8  ALBUMIN 3.9   No results found for this basename: LIPASE, AMYLASE,  in the last 168 hours No results found for this basename: AMMONIA,  in the last 168 hours CBC:  Recent Labs Lab 08/26/13 1710 08/26/13 1720  WBC 6.0  --   NEUTROABS 3.8  --   HGB 14.7 16.0  HCT 42.4 47.0  MCV 99.5  --   PLT 127*  --    Cardiac Enzymes: No results found for this basename: CKTOTAL, CKMB, CKMBINDEX, TROPONINI,  in the last 168 hours BNP: BNP (last 3 results) No results found for this basename: PROBNP,  in the last 8760 hours CBG:  Recent Labs Lab 08/26/13 2201 08/27/13 1142  GLUCAP 92 110*       Signed:  FELIZ ORTIZ, ABRAHAM  [facility] 08/27/2013, 4:27 PM

## 2013-08-27 NOTE — Progress Notes (Signed)
*  PRELIMINARY RESULTS* Vascular Ultrasound Carotid Duplex (Doppler) has been completed.  Preliminary findings: Bilateral:  1-39% ICA stenosis.  Vertebral artery flow is antegrade.       Farrel DemarkJill Eunice, RDMS, RVT  08/27/2013, 9:13 AM

## 2013-08-27 NOTE — Progress Notes (Signed)
UR completed 

## 2013-08-27 NOTE — Progress Notes (Addendum)
[facility] PROGRESS NOTE  Assessment/Plan: TIA (transient ischemic attack): - no tPA give. - HgbA1c, fasting lipid panel  - MRI, MRA of the brain without contrast as below positive for stroke. - PT consult, OT consult, Speech consult  Pending - Echocardiogram pending - Carotid dopplers as below - Prophylactic therapy-Antiplatelet med: at home on Aspirin and plavix, has not bee taking his plavix for 1 month. - Avoid D5 fluids as may be harmfull - Risk factor modification  - Cardiac Monitoring no events - Neurochecks q4h  - Keep MAP 70, allow permisive HTN - Hopefully home today   AKI: - Most likely pre-renal. Check FENA. - NS bolus, recheck b-met in am.  Code Status: none Family Communication: none  Disposition Plan: inpatinet   Consultants:  Neurology  Procedures: Mri/mra 8.7.2015: Subcentimeter area of restricted diffusion LEFT anterior medial thalamus consistent with a small acute infarct.  No basilar tip aneurysm filling is observed on MRA. Signal dropout  surrounds the areas of stent placement. Carotid Duplex (Doppler) has been completed. Preliminary findings: Bilateral: 1-39% ICA stenosis. Vertebral artery flow is antegrade  Antibiotics:  None  HPI/Subjective: Symptoms resolved.  Objective: Filed Vitals:   08/27/13 0200 08/27/13 0305 08/27/13 0500 08/27/13 0700  BP: 119/77 108/70 109/68 106/69  Pulse: 69 66 72 71  Temp: 97.4 F (36.3 C) 97.7 F (36.5 C) 98.6 F (37 C) 97.9 F (36.6 C)  TempSrc: Oral Oral Oral Oral  Resp: 18 16  18   Height:  5' 8"  (1.727 m)    Weight:  86.728 kg (191 lb 3.2 oz)    SpO2: 97% 96% 96% 94%   No intake or output data in the 24 hours ending 08/27/13 0847 Filed Weights   08/27/13 0305  Weight: 86.728 kg (191 lb 3.2 oz)    Exam:  General: Alert, awake, oriented x3, in no acute distress.  HEENT: No bruits, no goiter.  Heart: Regular rate and rhythm. Lungs: Good air movement,clear Abdomen: Soft, nontender,  nondistended, positive bowel sounds.  Neuro: Grossly intact, nonfocal.   Data Reviewed: Basic Metabolic Panel:  Recent Labs Lab 08/26/13 1710 08/26/13 1720  NA 142 142  K 3.9 3.8  CL 106 105  CO2 24  --   GLUCOSE 117* 120*  BUN 10 9  CREATININE 1.17 1.40*  CALCIUM 9.2  --    Liver Function Tests:  Recent Labs Lab 08/26/13 1710  AST 26  ALT 20  ALKPHOS 108  BILITOT 0.5  PROT 7.8  ALBUMIN 3.9   No results found for this basename: LIPASE, AMYLASE,  in the last 168 hours No results found for this basename: AMMONIA,  in the last 168 hours CBC:  Recent Labs Lab 08/26/13 1710 08/26/13 1720  WBC 6.0  --   NEUTROABS 3.8  --   HGB 14.7 16.0  HCT 42.4 47.0  MCV 99.5  --   PLT 127*  --    Cardiac Enzymes: No results found for this basename: CKTOTAL, CKMB, CKMBINDEX, TROPONINI,  in the last 168 hours BNP (last 3 results) No results found for this basename: PROBNP,  in the last 8760 hours CBG:  Recent Labs Lab 08/26/13 2201  GLUCAP 92    No results found for this or any previous visit (from the past 240 hour(s)).   Studies: Ct Head (brain) Wo Contrast  08/26/2013   CLINICAL DATA:  Code stroke.  Confusion.  Left-sided weakness.  EXAM: CT HEAD WITHOUT CONTRAST  TECHNIQUE: Contiguous axial images were obtained  from the base of the skull through the vertex without intravenous contrast.  COMPARISON:  Head CT 10/22/2012.  FINDINGS: Previously noted right frontal ventriculostomy shunt catheter has been removed. No evidence of hydrocephalus. Multiple embolization coils are noted in the expected location of the tip of the basilar artery. Faint physiologic calcifications are noted in the basal ganglia bilaterally. No acute intracranial abnormalities. Specifically, no evidence of acute intracranial hemorrhage, no definite findings of acute/subacute cerebral ischemia, no mass, mass effect, hydrocephalus or abnormal intra or extra-axial fluid collections. No acute displaced skull  fractures are identified. Mastoids are well pneumatized bilaterally. Mild mucosal thickening in the right maxillary sinus an extensive multifocal opacification of the ethmoid sinuses bilaterally. The frontal sinus and left maxillary sinus are well pneumatized.  IMPRESSION: 1. No acute intracranial abnormalities. 2. Status post removal right frontal ventriculostomy shunt catheter compared to the prior study, without evidence of hydrocephalus. 3. Additional incidental findings, as above, similar to the prior examination. These results were called by telephone at the time of interpretation on 08/26/2013 at 5:32 pm to Dr. Leonel Ramsay, who verbally acknowledged these results.   Electronically Signed   By: Vinnie Langton M.D.   On: 08/26/2013 17:33   Mr Jodene Nam Head Wo Contrast  08/26/2013   CLINICAL DATA:  History of basilar tip aneurysm rupture with subsequent coiling and stenting in Sept. 2014. Patient recently discontinued Plavix. Onset of dizziness at 1630 hr today. LEFT arm weakness transient, now resolved.  EXAM: MRI HEAD WITHOUT CONTRAST  MRA HEAD WITHOUT CONTRAST  TECHNIQUE: Multiplanar, multiecho pulse sequences of the brain and surrounding structures were obtained without intravenous contrast. Angiographic images of the head were obtained using MRA technique without contrast.  COMPARISON:  CT head earlier today. Cerebral angiography most recent 05/27/2013. CT angio head 10/09/2012.  FINDINGS: MRI HEAD FINDINGS  There is a subcentimeter area of restricted diffusion in the LEFT anterior medial thalamus which I believe represents a small area of acute infarction. It is sufficiently distant from the coil mass, and not seen on the RIGHT, so that artifact is deemed less likely. No restricted diffusion in the RIGHT hemisphere or brainstem.  Normal cerebral volume. Minor periventricular greater than subcortical white matter signal abnormality suggesting early small vessel disease. Blooming on MPGR images related to the  basilar tip aneurysm stent and coiling. Dolichoectasia with preserved flow voids. No midline abnormality. Chronic sinus disease particularly RIGHT maxillary and BILATERAL ethmoid. Minor BILATERAL mastoid fluid. Negative orbits.  MRA HEAD FINDINGS  Internal carotid arteries widely patent. Distal basilar artery attenuated by signal loss from the stent. No definite basilar tip aneurysmal filling. Signal dropout affects both P1 PCA segments. No ACA or MCA stenosis. No other aneurysms are observed.  IMPRESSION: Subcentimeter area of restricted diffusion LEFT anterior medial thalamus consistent with a small acute infarct.  No basilar tip aneurysm filling is observed on MRA. Signal dropout surrounds the areas of stent placement.   Electronically Signed   By: Rolla Flatten M.D.   On: 08/26/2013 20:14   Mr Brain Wo Contrast  08/26/2013   CLINICAL DATA:  History of basilar tip aneurysm rupture with subsequent coiling and stenting in Sept. 2014. Patient recently discontinued Plavix. Onset of dizziness at 1630 hr today. LEFT arm weakness transient, now resolved.  EXAM: MRI HEAD WITHOUT CONTRAST  MRA HEAD WITHOUT CONTRAST  TECHNIQUE: Multiplanar, multiecho pulse sequences of the brain and surrounding structures were obtained without intravenous contrast. Angiographic images of the head were obtained using MRA technique without contrast.  COMPARISON:  CT head earlier today. Cerebral angiography most recent 05/27/2013. CT angio head 10/09/2012.  FINDINGS: MRI HEAD FINDINGS  There is a subcentimeter area of restricted diffusion in the LEFT anterior medial thalamus which I believe represents a small area of acute infarction. It is sufficiently distant from the coil mass, and not seen on the RIGHT, so that artifact is deemed less likely. No restricted diffusion in the RIGHT hemisphere or brainstem.  Normal cerebral volume. Minor periventricular greater than subcortical white matter signal abnormality suggesting early small vessel  disease. Blooming on MPGR images related to the basilar tip aneurysm stent and coiling. Dolichoectasia with preserved flow voids. No midline abnormality. Chronic sinus disease particularly RIGHT maxillary and BILATERAL ethmoid. Minor BILATERAL mastoid fluid. Negative orbits.  MRA HEAD FINDINGS  Internal carotid arteries widely patent. Distal basilar artery attenuated by signal loss from the stent. No definite basilar tip aneurysmal filling. Signal dropout affects both P1 PCA segments. No ACA or MCA stenosis. No other aneurysms are observed.  IMPRESSION: Subcentimeter area of restricted diffusion LEFT anterior medial thalamus consistent with a small acute infarct.  No basilar tip aneurysm filling is observed on MRA. Signal dropout surrounds the areas of stent placement.   Electronically Signed   By: Rolla Flatten M.D.   On: 08/26/2013 20:14    Scheduled Meds: . aspirin  325 mg Oral Daily  . clopidogrel  300 mg Oral Once  . clopidogrel  75 mg Oral Q breakfast  . sodium chloride  3 mL Intravenous Q12H   Continuous Infusions:    Charlynne Cousins  [facility] Pager [phone]. If 8PM-8AM, please contact night-coverage at [email or web address], password [facility] 08/27/2013, 8:47 AM  LOS: 1 day      **Disclaimer: This note may have been dictated with voice recognition software. Similar sounding words can inadvertently be transcribed and this note may contain transcription errors which may not have been corrected upon publication of note.**

## 2014-04-13 ENCOUNTER — Other Ambulatory Visit (HOSPITAL_COMMUNITY): Payer: Self-pay | Admitting: Neurosurgery

## 2014-04-14 ENCOUNTER — Other Ambulatory Visit (HOSPITAL_COMMUNITY): Payer: Self-pay | Admitting: Neurosurgery

## 2014-04-14 DIAGNOSIS — I609 Nontraumatic subarachnoid hemorrhage, unspecified: Secondary | ICD-10-CM

## 2014-05-27 ENCOUNTER — Ambulatory Visit (HOSPITAL_COMMUNITY)
Admission: RE | Admit: 2014-05-27 | Discharge: 2014-05-27 | Disposition: A | Discharge status: Home / Self Care | Payer: Medicaid Other | Source: Ambulatory Visit | Attending: Neurosurgery | Admitting: Neurosurgery

## 2014-05-27 ENCOUNTER — Other Ambulatory Visit (HOSPITAL_COMMUNITY): Payer: Self-pay | Admitting: Neurosurgery

## 2014-05-27 DIAGNOSIS — Z7982 Long term (current) use of aspirin: Secondary | ICD-10-CM | POA: Diagnosis not present

## 2014-05-27 DIAGNOSIS — Z7902 Long term (current) use of antithrombotics/antiplatelets: Secondary | ICD-10-CM | POA: Insufficient documentation

## 2014-05-27 DIAGNOSIS — Z79899 Other long term (current) drug therapy: Secondary | ICD-10-CM | POA: Diagnosis not present

## 2014-05-27 DIAGNOSIS — Z8673 Personal history of transient ischemic attack (TIA), and cerebral infarction without residual deficits: Secondary | ICD-10-CM | POA: Diagnosis not present

## 2014-05-27 DIAGNOSIS — I671 Cerebral aneurysm, nonruptured: Secondary | ICD-10-CM | POA: Diagnosis not present

## 2014-05-27 DIAGNOSIS — I609 Nontraumatic subarachnoid hemorrhage, unspecified: Secondary | ICD-10-CM

## 2014-05-27 DIAGNOSIS — Z48812 Encounter for surgical aftercare following surgery on the circulatory system: Secondary | ICD-10-CM | POA: Diagnosis present

## 2014-05-27 LAB — BASIC METABOLIC PANEL
Anion gap: 6 (ref 5–15)
BUN: 11 mg/dL (ref 6–20)
CHLORIDE: 109 mmol/L (ref 101–111)
CO2: 25 mmol/L (ref 22–32)
CREATININE: 0.99 mg/dL (ref 0.61–1.24)
Calcium: 8.9 mg/dL (ref 8.9–10.3)
GFR calc Af Amer: >60 mL/min (ref >60)
GFR calc non Af Amer: >60 mL/min (ref >60)
Glucose, Bld: 96 mg/dL (ref 70–99)
Potassium: 4.1 mmol/L (ref 3.5–5.1)
Sodium: 140 mmol/L (ref 135–145)

## 2014-05-27 LAB — CBC WITH DIFFERENTIAL/PLATELET
Basophils Absolute: 0 10*3/uL (ref 0.0–0.1)
Basophils Relative: 1 % (ref 0–1)
EOS ABS: 0.3 10*3/uL (ref 0.0–0.7)
Eosinophils Relative: 4 % (ref 0–5)
HCT: 42.3 % (ref 39.0–52.0)
HEMOGLOBIN: 14.7 g/dL (ref 13.0–17.0)
LYMPHS ABS: 1.2 10*3/uL (ref 0.7–4.0)
Lymphocytes Relative: 19 % (ref 12–46)
MCH: 33.8 pg (ref 26.0–34.0)
MCHC: 34.8 g/dL (ref 30.0–36.0)
MCV: 97.2 fL (ref 78.0–100.0)
MONO ABS: 0.4 10*3/uL (ref 0.1–1.0)
MONOS PCT: 6 % (ref 3–12)
Neutro Abs: 4.6 10*3/uL (ref 1.7–7.7)
Neutrophils Relative %: 71 % (ref 43–77)
Platelets: 105 10*3/uL — ABNORMAL LOW (ref 150–400)
RBC: 4.35 MIL/uL (ref 4.22–5.81)
RDW: 12.5 % (ref 11.5–15.5)
WBC: 6.5 10*3/uL (ref 4.0–10.5)

## 2014-05-27 LAB — URINALYSIS, ROUTINE W REFLEX MICROSCOPIC
Bilirubin Urine: NEGATIVE
GLUCOSE, UA: NEGATIVE mg/dL
HGB URINE DIPSTICK: NEGATIVE
Ketones, ur: NEGATIVE mg/dL
Leukocytes, UA: NEGATIVE
NITRITE: NEGATIVE
Protein, ur: NEGATIVE mg/dL
SPECIFIC GRAVITY, URINE: 1.017 (ref 1.005–1.030)
Urobilinogen, UA: 0.2 mg/dL (ref 0.0–1.0)
pH: 6 (ref 5.0–8.0)

## 2014-05-27 LAB — APTT: APTT: 30 s (ref 24–37)

## 2014-05-27 LAB — PROTIME-INR
INR: 1.15 (ref 0.00–1.49)
Prothrombin Time: 14.8 seconds (ref 11.6–15.2)

## 2014-05-27 MED ORDER — MIDAZOLAM HCL 2 MG/2ML IJ SOLN
INTRAMUSCULAR | Status: AC
  Filled 2014-05-27: qty 2

## 2014-05-27 MED ORDER — MIDAZOLAM HCL 2 MG/2ML IJ SOLN
INTRAMUSCULAR | Status: AC | PRN
  Administered 2014-05-27: 0.5 mg via INTRAVENOUS

## 2014-05-27 MED ORDER — HYDROCODONE-ACETAMINOPHEN 5-325 MG PO TABS: 1.0000 | ORAL_TABLET | ORAL | Status: DC | PRN

## 2014-05-27 MED ORDER — LIDOCAINE HCL 1 % IJ SOLN
INTRAMUSCULAR | Status: AC
  Filled 2014-05-27: qty 20

## 2014-05-27 MED ORDER — HEPARIN SODIUM (PORCINE) 1000 UNIT/ML IJ SOLN
INTRAMUSCULAR | Status: AC
  Filled 2014-05-27: qty 1

## 2014-05-27 MED ORDER — IOHEXOL 300 MG/ML  SOLN
150.0000 mL | Freq: Once | INTRAMUSCULAR | Status: AC | PRN
  Administered 2014-05-27: 40 mL via INTRA_ARTERIAL

## 2014-05-27 MED ORDER — HEPARIN SODIUM (PORCINE) 1000 UNIT/ML IJ SOLN
INTRAMUSCULAR | Status: AC | PRN
  Administered 2014-05-27: 2000 [IU] via INTRAVENOUS

## 2014-05-27 MED ORDER — FENTANYL CITRATE (PF) 100 MCG/2ML IJ SOLN
INTRAMUSCULAR | Status: AC | PRN
  Administered 2014-05-27: 25 ug via INTRAVENOUS

## 2014-05-27 MED ORDER — FENTANYL CITRATE (PF) 100 MCG/2ML IJ SOLN
INTRAMUSCULAR | Status: AC
  Filled 2014-05-27: qty 2

## 2014-05-27 MED ORDER — SODIUM CHLORIDE 0.9 % IV SOLN
INTRAVENOUS | Status: DC
  Administered 2014-05-27: 15:00:00 via INTRAVENOUS

## 2014-05-27 NOTE — Sedation Documentation (Addendum)
Leandra KernBecky Frye, Rad tech, holding manual pressure to Rt groin after placing 5 Fr exoseal. Hemostasis achieved and pulses palpable 2+ pedal and post tib.

## 2014-05-27 NOTE — Sedation Documentation (Signed)
Pt stated he does not have a pork allergy, just does not take pork d/t religious reasons. Pt made aware that heparin is derived from pork products and is okay with receiving it.  Juliette AlcideMelinda Alewine witnessed pt's verbal understanding.

## 2014-05-27 NOTE — Sedation Documentation (Signed)
Pt w/ no complaints of pain, lying still, no moaning or grimacing noted.

## 2014-05-27 NOTE — Op Note (Signed)
DIAGNOSTIC CEREBRAL ANGIOGRAM    OPERATOR:   Dr. Lisbeth RenshawNeelesh Kirkland Figg, MD  HISTORY:   The patient is a 54 y.o. 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 He now presents for routine angiographic follow-up.  APPROACH:   The technical aspects of the procedure as well as its potential risks and benefits were reviewed with the patient. These risks included but were not limited bleeding, infection, allergic reaction, damage to organs/vital structures, stroke, non-diagnostic procedure, and the catastrophic outcomes of heart attack, coma, and death. With an understanding of these risks, informed consent was obtained and witnessed.    The patient was placed in the supine position on the angiography table and the skin of right groin prepped in the usual sterile fashion. The procedure was performed under local anesthesia (1%-solution of bicarbonate-bufferred Lidoacaine) and conscious sedation with Versed and fentanyl monitored by the in-suite nurse.    A 5- French sheath was introduced in the right common femoral artery using Seldinger technique.  A fluorophase sequence was used to document the sheath position.    HEPARIN: 2000 Units total.   CONTRAST AGENT: 50cc, Omnipaque 300   FLUOROSCOPY TIME: 5.5 combined AP and lateral minutes    CATHETER(S) AND WIRE(S):    5-French JB-1 glidecatheter   0.035" glidewire    VESSELS CATHETERIZED:   Right common carotid   Left common carotid   Right subclavian Left vertebral   Right common femoral  VESSELS STUDIED:   Right common carotid: head: AP, lateral, obliques   Right vertebral: AP, lateral   Left common carotid: head: AP, lateral, obliques   Left vertebral: AP, lateral , magnified obliques Right femoral: RAO  PROCEDURAL NARRATIVE:   A 5-Fr JB-1 terumo glide catheter was then advanced over a 0.035  glidewire into the aortic arch and the innominate and right common carotid artery were selected. Cerebral angiography was performed. The JB-1 was then withdrawn into the innominate artery and the right subclavian artery was selected. Cerebral angiography was performed. The JB-1 was then withdrawn into the aortic arch and the left common carotid artery was selected. Cerebral angiography was performed. The JB-1 catheter was then withdrawn into the aortic arch and the left subclavian artery was selected followed by the left vertebral artery. Cerebral angiography was performed. The JB-1 catheter was removed without incident.    INTERPRETATION:   Right common carotid: head:   Injection reveals the presence of a widely patent ICA, M1, and A1 segments and their branches. There is no significant stenosis, occlusion, aneurysm or high flow vascular malformation visualized.  The parenchymal and venous phases are normal. The venous sinuses are widely patent.    Left common carotid: head:   Injection reveals the presence of a widely patent ICA, A1, and M1 segments and their branches. There is no significant stenosis, occlusion, aneurysm, or high flow vascular malformation visualized. The parenchymal and venous phases are normal. The venous sinuses are widely patent.    Left vertebral:   Injection reveals the presence of a widely patent vertebral artery. This leads to a widely patent basilar artery that terminates in bilateral P1. The previously placed stents are seen extending from the mid basilar to bilateral P2 segments. No in-stent stenosis is seen. There is a minimal aneurysm recurrence at the neck measuring approximately 2.5 x 2.2 mm. The parenchymal and venous phases are normal. The venous sinuses  are widely patent.    Right vertebral:    Normal vessel. No PICA aneurysm. See basilar description above.    Right femoral:    Normal vessel. No significant atherosclerotic disease. Arterial sheath in adequate  position.   DISPOSITION:  Upon completion of the study, the femoral sheath was removed and hemostasis obtained using a 5-Fr ExoSeal closure device. Good proximal and distal lower extremity pulses were documented upon achievement of hemostasis.    The procedure was well tolerated and no early complications were observed.       The patient was transferred back to the holding area to be positioned flat in bed for 3 hours of observation.    IMPRESSION:  1. Small approximately 2.5 x 2.2 mm recurrence at the neck of the previously stent coil basilar apex aneurysm.  The preliminary results of this procedure were shared with the patient and the patient's family.

## 2014-05-27 NOTE — Sedation Documentation (Signed)
Patient is resting comfortably.  Follows commands to hold breath and lie still easily.

## 2014-05-27 NOTE — H&P (Signed)
CC:  Aneurysm  HPI: Richard Davenport is a 54 y.o. 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 Follow-up angiogram demonstrated neck recurrence, and he underwent a stent supported coil embolization of the recurrence. He now presents for routine elective follow-up diagnostic cerebral angiogram.  PMH: Past Medical History  Diagnosis Date  . Headache   . Subarachnoid hemorrhage   . Stroke     PSH: Past Surgical History  Procedure Laterality Date  . Radiology with anesthesia N/A 10/09/2012    Procedure: RADIOLOGY WITH ANESTHESIA;  Surgeon: Lisbeth RenshawNeelesh Brieana Shimmin, MD;  Location[facility];  Service: Radiology;  Laterality: N/A;  . Aneurysm coiling      for bleed  . Radiology with anesthesia N/A 05/27/2013    Procedure: RADIOLOGY WITH ANESTHESIA;  Surgeon: Lisbeth RenshawNeelesh Enriqueta Augusta, MD;  [facility];  Service: Radiology;  Laterality: N/A;    SH: History  Substance Use Topics  . Smoking status: Never Smoker   . Smokeless tobacco: Never Used  . Alcohol Use: No    MEDS: Prior to Admission medications   Medication Sig Start Date End Date Taking? Authorizing Provider  ibuprofen (ADVIL,MOTRIN) 200 MG tablet Take 400-600 mg by mouth every 6 (six) hours as needed (pain).   Yes Historical Provider, MD  aspirin EC 81 MG EC tablet Take 1 tablet (81 mg total) by mouth daily. Patient not taking: Reported on 05/20/2014 08/28/13   Marinda ElkAbraham Feliz Ortiz, MD  atorvastatin (LIPITOR) 40 MG tablet Take 1 tablet (40 mg total) by mouth daily at 6 PM. Patient not taking: Reported on 05/20/2014 08/27/13   Marinda ElkAbraham Feliz Ortiz, MD  clopidogrel (PLAVIX) 75 MG tablet Take 1 tablet (75 mg total) by mouth daily with breakfast. Patient not taking: Reported on 05/20/2014 08/27/13   Marinda ElkAbraham Feliz Ortiz, MD    ALLERGY: Allergies  Allergen Reactions  . Pork-Derived Products Other (See Comments)    Patient is Muslim and PREFERS TO NOT TAKE ANY PORK OR MEAT  PRODUCTS (only fish)    ROS: ROS  NEUROLOGIC EXAM: Awake, alert, oriented Memory and concentration grossly intact Speech fluent, appropriate CN grossly intact Motor exam: Upper Extremities Deltoid Bicep Tricep Grip  Right 5/5 5/5 5/5 5/5  Left 5/5 5/5 5/5 5/5   Lower Extremity IP Quad PF DF EHL  Right 5/5 5/5 5/5 5/5 5/5  Left 5/5 5/5 5/5 5/5 5/5   Sensation grossly intact to LT  IMPRESSION: - 54 y.o. 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 After all questions were answered, informed consent was obtained.

## 2014-05-27 NOTE — Sedation Documentation (Signed)
Tried to call report to short stay but nurses unavailable. To call me back.

## 2014-05-27 NOTE — Discharge Instructions (Signed)

## 2014-05-30 ENCOUNTER — Other Ambulatory Visit (HOSPITAL_COMMUNITY): Payer: Self-pay | Admitting: Neurosurgery

## 2014-05-30 DIAGNOSIS — I609 Nontraumatic subarachnoid hemorrhage, unspecified: Secondary | ICD-10-CM

## 2015-07-09 ENCOUNTER — Encounter (HOSPITAL_COMMUNITY): Payer: Self-pay | Admitting: Emergency Medicine

## 2015-07-09 ENCOUNTER — Emergency Department (HOSPITAL_COMMUNITY): Payer: Medicaid Other

## 2015-07-09 ENCOUNTER — Emergency Department (HOSPITAL_COMMUNITY)
Admission: EM | Admit: 2015-07-09 | Discharge: 2015-07-09 | Disposition: A | Discharge status: Home / Self Care | Payer: Medicaid Other | Attending: Emergency Medicine | Admitting: Emergency Medicine

## 2015-07-09 DIAGNOSIS — M545 Low back pain, unspecified: Secondary | ICD-10-CM

## 2015-07-09 DIAGNOSIS — R1013 Epigastric pain: Secondary | ICD-10-CM | POA: Diagnosis not present

## 2015-07-09 DIAGNOSIS — Z8673 Personal history of transient ischemic attack (TIA), and cerebral infarction without residual deficits: Secondary | ICD-10-CM | POA: Insufficient documentation

## 2015-07-09 DIAGNOSIS — R935 Abnormal findings on diagnostic imaging of other abdominal regions, including retroperitoneum: Secondary | ICD-10-CM

## 2015-07-09 LAB — COMPREHENSIVE METABOLIC PANEL
ALBUMIN: 3.4 g/dL — AB (ref 3.5–5.0)
ALK PHOS: 110 U/L (ref 38–126)
ALT: 27 U/L (ref 17–63)
AST: 30 U/L (ref 15–41)
Anion gap: 7 (ref 5–15)
BUN: 11 mg/dL (ref 6–20)
CALCIUM: 8.7 mg/dL — AB (ref 8.9–10.3)
CO2: 24 mmol/L (ref 22–32)
CREATININE: 0.95 mg/dL (ref 0.61–1.24)
Chloride: 102 mmol/L (ref 101–111)
GFR calc Af Amer: >60 mL/min (ref >60)
GFR calc non Af Amer: >60 mL/min (ref >60)
GLUCOSE: 165 mg/dL — AB (ref 65–99)
Potassium: 3.8 mmol/L (ref 3.5–5.1)
Sodium: 133 mmol/L — ABNORMAL LOW (ref 135–145)
TOTAL PROTEIN: 7.3 g/dL (ref 6.5–8.1)
Total Bilirubin: 0.8 mg/dL (ref 0.3–1.2)

## 2015-07-09 LAB — LIPASE, BLOOD: Lipase: 22 U/L (ref 11–51)

## 2015-07-09 LAB — URINALYSIS, ROUTINE W REFLEX MICROSCOPIC
Bilirubin Urine: NEGATIVE
Glucose, UA: NEGATIVE mg/dL
HGB URINE DIPSTICK: NEGATIVE
Ketones, ur: NEGATIVE mg/dL
Leukocytes, UA: NEGATIVE
NITRITE: NEGATIVE
Protein, ur: NEGATIVE mg/dL
Specific Gravity, Urine: 1.022 (ref 1.005–1.030)
pH: 6.5 (ref 5.0–8.0)

## 2015-07-09 LAB — CBC
HCT: 39.2 % (ref 39.0–52.0)
Hemoglobin: 13.7 g/dL (ref 13.0–17.0)
MCH: 33.3 pg (ref 26.0–34.0)
MCHC: 34.9 g/dL (ref 30.0–36.0)
MCV: 95.1 fL (ref 78.0–100.0)
PLATELETS: 155 10*3/uL (ref 150–400)
RBC: 4.12 MIL/uL — ABNORMAL LOW (ref 4.22–5.81)
RDW: 12.3 % (ref 11.5–15.5)
WBC: 7.1 10*3/uL (ref 4.0–10.5)

## 2015-07-09 MED ORDER — PANTOPRAZOLE SODIUM 40 MG PO TBEC
40.0000 mg | DELAYED_RELEASE_TABLET | Freq: Once | ORAL | Status: AC
  Administered 2015-07-09: 40 mg via ORAL
  Filled 2015-07-09: qty 1

## 2015-07-09 MED ORDER — POLYETHYLENE GLYCOL 3350 17 G PO PACK: 17.0000 g | PACK | Freq: Every day | ORAL | Status: DC

## 2015-07-09 MED ORDER — PANTOPRAZOLE SODIUM 20 MG PO TBEC: 20.0000 mg | DELAYED_RELEASE_TABLET | Freq: Every day | ORAL | Status: DC

## 2015-07-09 MED ORDER — IOPAMIDOL (ISOVUE-300) INJECTION 61%
INTRAVENOUS | Status: AC
  Administered 2015-07-09: 100 mL
  Filled 2015-07-09: qty 100

## 2015-07-09 MED ORDER — TRAMADOL HCL 50 MG PO TABS: 50.0000 mg | ORAL_TABLET | Freq: Four times a day (QID) | ORAL | Status: DC | PRN

## 2015-07-09 MED ORDER — SODIUM CHLORIDE 0.9 % IV SOLN
INTRAVENOUS | Status: DC
  Administered 2015-07-09: 19:00:00 via INTRAVENOUS

## 2015-07-09 NOTE — ED Notes (Signed)
Pt sts back pain x 3 weeks and abd pain x 3 months

## 2015-07-09 NOTE — ED Provider Notes (Signed)
CSN: [id number]     Arrival date & time 07/09/15  1655 History   First MD Initiated Contact with Patient 07/09/15 1753     Chief Complaint  Patient presents with  . Back Pain  . Abdominal Pain     (Consider location/radiation/quality/duration/timing/severity/associated sxs/prior Treatment) HPI Patient has been experiencing periodic back pain for about 3 months. It comes and goes. It is usually in his central thoracic or lumbar back. He has also had periodic abdominal pain. This is generally epigastric or. Umbilical. Quality is aching. No radiation into the legs. No pain burning or urgency with urination. No blood in the urine. No vomiting or diarrhea. No fevers or chills. Past Medical History  Diagnosis Date  . Headache   . Subarachnoid hemorrhage (HCC)   . Stroke [facility](HCC)    Past Surgical History  Procedure Laterality Date  . Radiology with anesthesia N/A 10/09/2012    Procedure: RADIOLOGY WITH ANESTHESIA;  Surgeon: Lisbeth RenshawNeelesh Nundkumar, MD;  [facility];  Service: Radiology;  Laterality: N/A;  . Aneurysm coiling      for bleed  . Radiology with anesthesia N/A 05/27/2013    Procedure: RADIOLOGY WITH ANESTHESIA;  Surgeon: Lisbeth RenshawNeelesh Nundkumar, MD;  [facility];  Service: Radiology;  Laterality: N/A;   Family History  Problem Relation Age of Onset  . Hypertension Mother   . Hypertension Father    Social History  Substance Use Topics  . Smoking status: Never Smoker   . Smokeless tobacco: Never Used  . Alcohol Use: No    Review of Systems  10 Systems reviewed and are negative for acute change except as noted in the HPI.   Allergies  Pork-derived products  Home Medications   Prior to Admission medications   Medication Sig Start Date End Date Taking? Authorizing Provider  ibuprofen (ADVIL,MOTRIN) 200 MG tablet Take 800-1,200 mg by mouth daily as needed for headache or moderate pain.    Yes Historical Provider, MD  Probiotic Product (PROBIOTIC PO) Take 1 tablet by mouth  daily as needed (for digestion).   Yes Historical Provider, MD  traMADol (ULTRAM) 50 MG tablet Take 50 mg by mouth daily.   Yes Historical Provider, MD  pantoprazole (PROTONIX) 20 MG tablet Take 1 tablet (20 mg total) by mouth daily. 07/09/15   Arby BarretteMarcy Jake Goodson, MD   BP 125/96 mmHg  Pulse 76  Temp(Src) 97.9 F (36.6 C) (Oral)  Resp 15  SpO2 97% Physical Exam  Constitutional: He is oriented to person, place, and time. He appears well-developed and well-nourished.  HENT:  Head: Normocephalic and atraumatic.  Eyes: EOM are normal. Pupils are equal, round, and reactive to light.  Neck: Neck supple.  Cardiovascular: Normal rate, regular rhythm, normal heart sounds and intact distal pulses.   Pulmonary/Chest: Effort normal and breath sounds normal.  Abdominal: Soft. Bowel sounds are normal. He exhibits no distension. There is tenderness.  Minimally reproducible periumbilical pain. No guarding rebound or masses.  Musculoskeletal: Normal range of motion. He exhibits no edema or tenderness.  No reproducible point tenderness on the back. No CVA tenderness.  Neurological: He is alert and oriented to person, place, and time. He has normal strength. Coordination normal. GCS eye subscore is 4. GCS verbal subscore is 5. GCS motor subscore is 6.  Skin: Skin is warm, dry and intact.  Psychiatric: He has a normal mood and affect.    ED Course  Procedures (including critical care time) Labs Review Labs Reviewed  COMPREHENSIVE METABOLIC PANEL - Abnormal; Notable  for the following:    Sodium 133 (*)    Glucose, Bld 165 (*)    Calcium 8.7 (*)    Albumin 3.4 (*)    All other components within normal limits  CBC - Abnormal; Notable for the following:    RBC 4.12 (*)    All other components within normal limits  LIPASE, BLOOD  URINALYSIS, ROUTINE W REFLEX MICROSCOPIC (NOT AT [facility])    Imaging Review Ct Abdomen Pelvis W Contrast  07/09/2015  CLINICAL DATA:  55 year old male with central abdominal  pain radiating to the back. EXAM: CT ABDOMEN AND PELVIS WITH CONTRAST TECHNIQUE: Multidetector CT imaging of the abdomen and pelvis was performed using the standard protocol following bolus administration of intravenous contrast. CONTRAST:  ISOVUE-300 IOPAMIDOL (ISOVUE-300) INJECTION 61% COMPARISON:  None. FINDINGS: The visualized lung bases are clear. No intra-abdominal free air or free fluid. The liver, gallbladder, pancreas, spleen, adrenal glands, kidneys, visualized ureters, and urinary bladder appear unremarkable. The prostate and seminal vesicles are grossly unremarkable. There is a focal area outpouching from the posterior wall of the gastric antrum/ pylorus concerning for a peptic ulcer versus less likely a diverticulum. There is thickening and inflammatory changes of the this portion of the stomach. There is no evidence of perforation at this time. No fluid collection identified. Correlation with clinical exam and follow-up recommended. There is no evidence of bowel obstruction. There multiple small scattered colonic diverticula without active inflammatory changes. The appendix is unremarkable. The abdominal aorta and IVC appears unremarkable. The origins of the celiac axis, SMA, IMA as well as the origins of the renal arteries are patent. No portal venous gas identified. There is no adenopathy. The abdominal wall soft tissues appear unremarkable. There is mild degenerative changes of the spine. No acute osseous pathology. IMPRESSION: Inflamed peptic ulcer versus diverticulitis of the distal stomach. No evidence of abscess or perforation. Clinical correlation is recommended. No bowel obstruction. Normal appendix. Electronically Signed   By: Elgie Collard M.D.   On: 07/09/2015 21:44   I have personally reviewed and evaluated these images and lab results as part of my medical decision-making.   EKG Interpretation None      MDM   Final diagnoses:  Epigastric pain  Midline low back pain  without sciatica  Abnormal CT of the abdomen   Patient has had variable central back pain and epigastric pain. Associated neurologic symptoms. His abdominal examination is soft and minimally tender. This has been indolent in nature. At this time, CT shows area of inflammation at the bottom the stomach. It is suggested this may be peptic ulcer disease. At this time patient is counseled on avoiding all NSAID type medications. He will be started on Protonix. He is counseled on the necessity of follow-up for further diagnostic evaluation particularly endoscopy. He is advised that at this time it may also represent a stomach cancer that cannot be differentiated by CT. His daughter is present and they understand the follow-up plan and current treatment program.    Arby Barrette, MD 07/09/15 2225

## 2015-07-09 NOTE — Discharge Instructions (Signed)
Suspected Peptic Ulcer Your CT scan has inflammation in the area of the bottom of the stomach. This may be an ulcer. He will have to have an evaluation by a gastroenterologist with an endoscopy to evaluate for ulcers or cancer. Do not take any ibuprofen, Aleve, aspirin or medication consider to be of the NSAID type. A peptic ulcer is a sore in the lining of your esophagus (esophageal ulcer), stomach (gastric ulcer), or in the first part of your small intestine (duodenal ulcer). The ulcer causes erosion into the deeper tissue. CAUSES  Normally, the lining of the stomach and the small intestine protects itself from the acid that digests food. The protective lining can be damaged by:  An infection caused by a bacterium called Helicobacter pylori (H. pylori).  Regular use of nonsteroidal anti-inflammatory drugs (NSAIDs), such as ibuprofen or aspirin.  Smoking tobacco. Other risk factors include being older than 50, drinking alcohol excessively, and having a family history of ulcer disease.  SYMPTOMS   Burning pain or gnawing in the area between the chest and the belly button.  Heartburn.  Nausea and vomiting.  Bloating. The pain can be worse on an empty stomach and at night. If the ulcer results in bleeding, it can cause:  Black, tarry stools.  Vomiting of bright red blood.  Vomiting of coffee-ground-looking materials. DIAGNOSIS  A diagnosis is usually made based upon your history and an exam. Other tests and procedures may be performed to find the cause of the ulcer. Finding a cause will help determine the best treatment. Tests and procedures may include:  Blood tests, stool tests, or breath tests to check for the bacterium H. pylori.  An upper gastrointestinal (GI) series of the esophagus, stomach, and small intestine.  An endoscopy to examine the esophagus, stomach, and small intestine.  A biopsy. TREATMENT  Treatment may include:  Eliminating the cause of the ulcer, such as  smoking, NSAIDs, or alcohol.  Medicines to reduce the amount of acid in your digestive tract.  Antibiotic medicines if the ulcer is caused by the H. pylori bacterium.  An upper endoscopy to treat a bleeding ulcer.  Surgery if the bleeding is severe or if the ulcer created a hole somewhere in the digestive system. HOME CARE INSTRUCTIONS   Avoid tobacco, alcohol, and caffeine. Smoking can increase the acid in the stomach, and continued smoking will impair the healing of ulcers.  Avoid foods and drinks that seem to cause discomfort or aggravate your ulcer.  Only take medicines as directed by your caregiver. Do not substitute over-the-counter medicines for prescription medicines without talking to your caregiver.  Keep any follow-up appointments and tests as directed. SEEK MEDICAL CARE IF:   Your do not improve within 7 days of starting treatment.  You have ongoing indigestion or heartburn. SEEK IMMEDIATE MEDICAL CARE IF:   You have sudden, sharp, or persistent abdominal pain.  You have bloody or dark black, tarry stools.  You vomit blood or vomit that looks like coffee grounds.  You become light-headed, weak, or feel faint.  You become sweaty or clammy. MAKE SURE YOU:   Understand these instructions.  Will watch your condition.  Will get help right away if you are not doing well or get worse.   This information is not intended to replace advice given to you by your health care provider. Make sure you discuss any questions you have with your health care provider.   Document Released: 01/05/2000 Document Revised: 01/28/2014 Document Reviewed: 08/07/2011 Elsevier  Interactive Patient Education ©2016 [employer] ° °

## 2016-05-03 IMAGING — XA IR TRANSCATH EMBOLIZATION
8 of 9 series · 11 of 24 positions shown · IV contrast (IODINE)
Comparison: none

PROCEDURE:
STENT-SUPPORTED COIL EMBOLIZATION OF RECURRENT BASILAR ARTERY
ANEURYSM

OPERATORS:
Dr. Krzanowska, Halldor.
HISTORY: The patient is a 52-year-old man with a history of subarachnoid
hemorrhage who underwent primary coil embolization of a basilar
aneurysm. He recovered well, and underwent six-month follow-up
angiogram which demonstrated coil compaction and aneurysm
recurrence. He now presents for elective stent supported coil
embolization of the aneurysm recurrence.

[Series 1: carotid 1 · 2 acquisitions, 2 frames shown (1 of 6)]
[im 1/2]
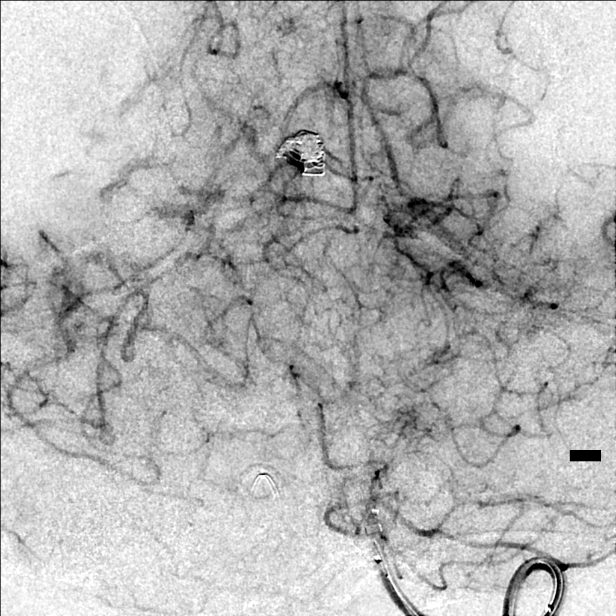
[im 2/2]
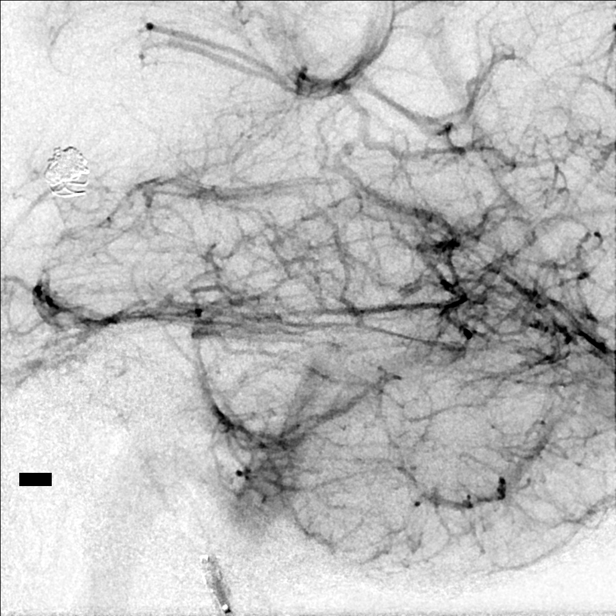

[Series 3: carotid 1 · 2 acquisitions, 1 frame shown (2 of 6)]
[im 1/2]
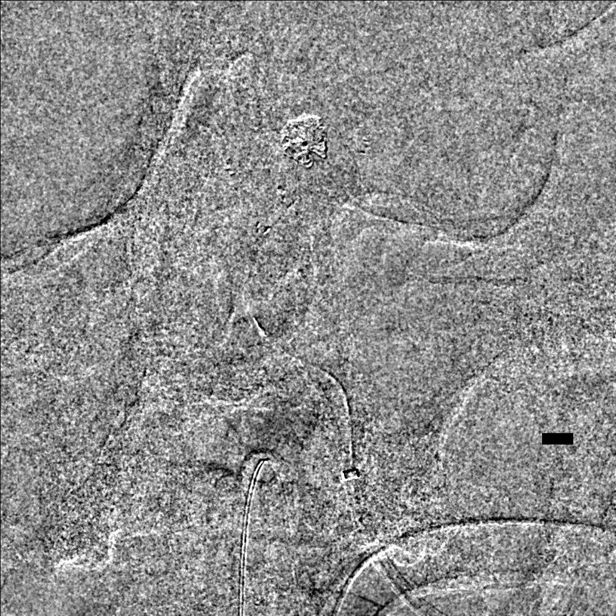

[Series 4: carotid 1 · 2 acquisitions, 1 frame shown (3 of 6)]
[im 1/2]
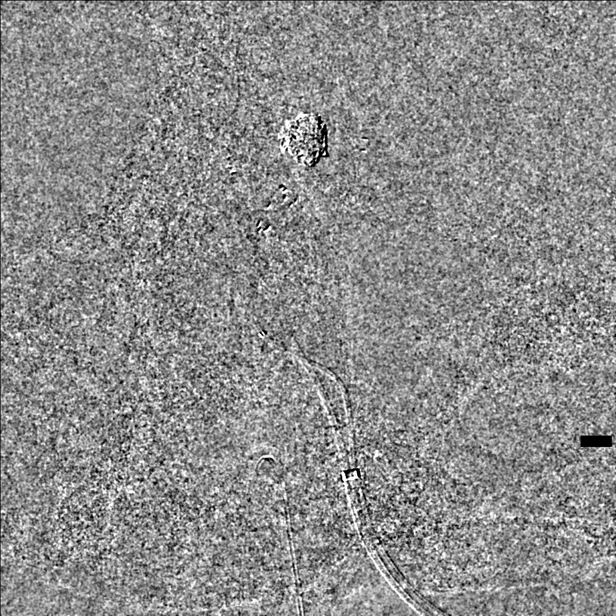

[Series 9: carotid 1 · 2 acquisitions, 1 frame shown (4 of 6)]
[im 1/2]
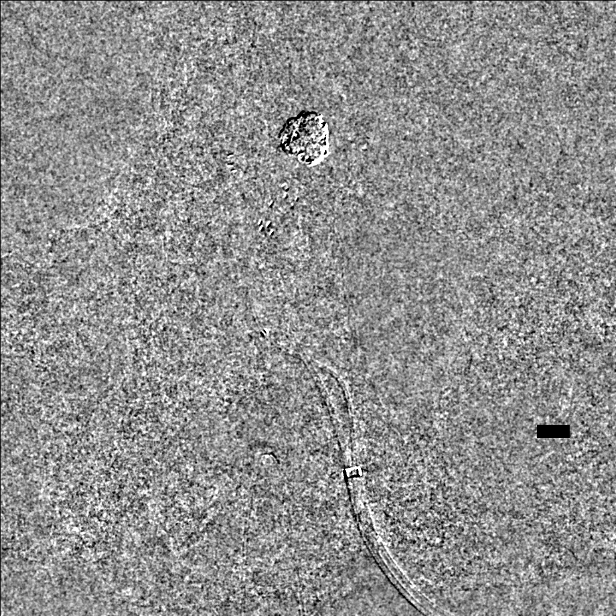

[Series 10: carotid 1 · 2 acquisitions, 1 frame shown (5 of 6)]
[im 1/2]
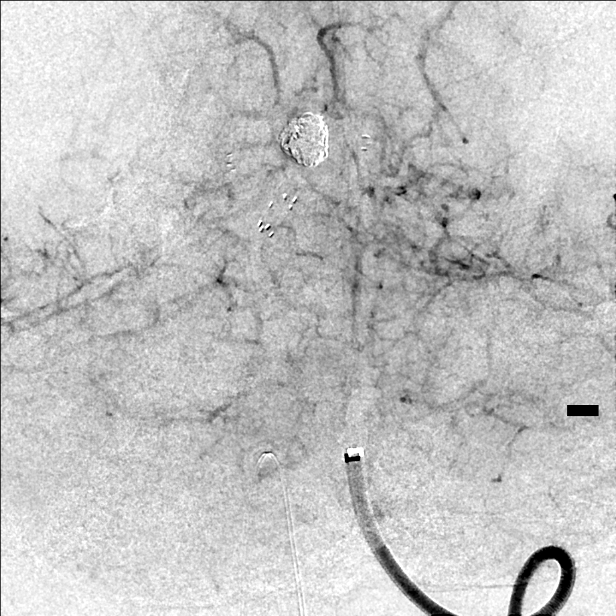

[Series 11: carotid 1 · 2 acquisitions, 1 frame shown (6 of 6)]
[im 1/2]
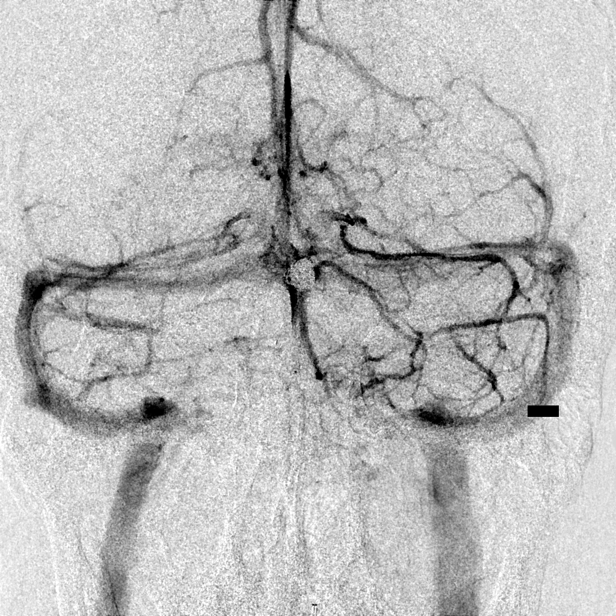

[Series 13: fl-  neuro · 1 of 15 frames shown]
[frame 13/15]
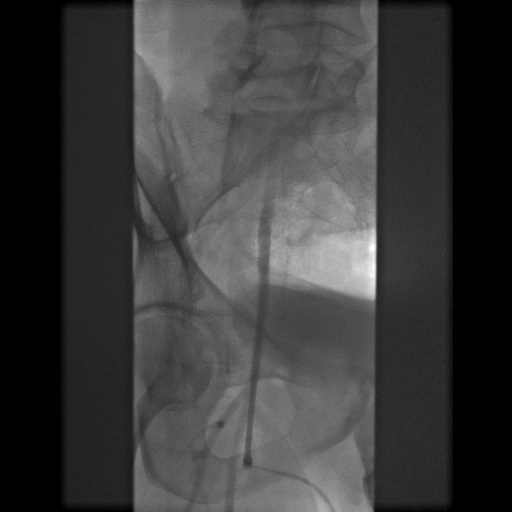

[Series 300: neuro · 3 of 26 slices shown]
[im 6/26]
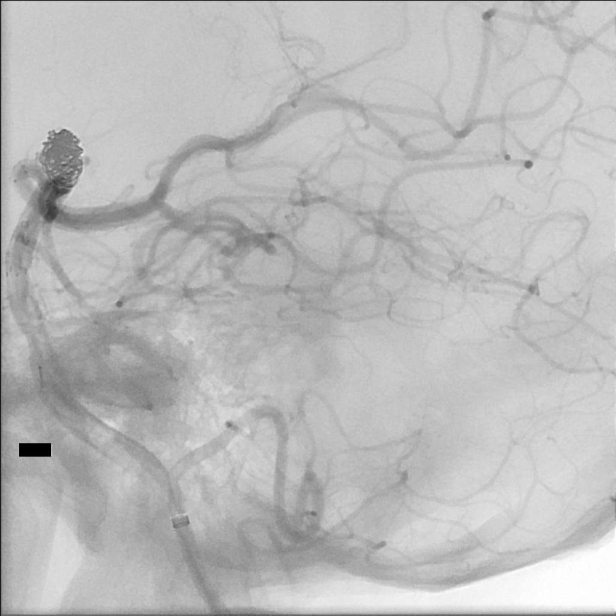
[im 15/26]
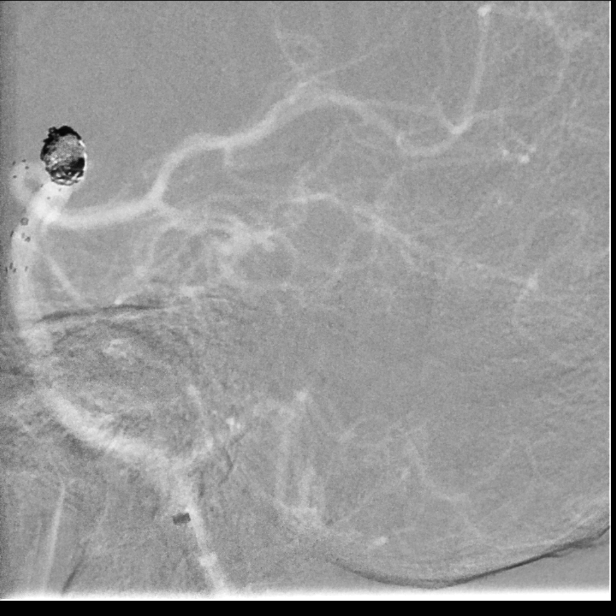
[im 22/26]
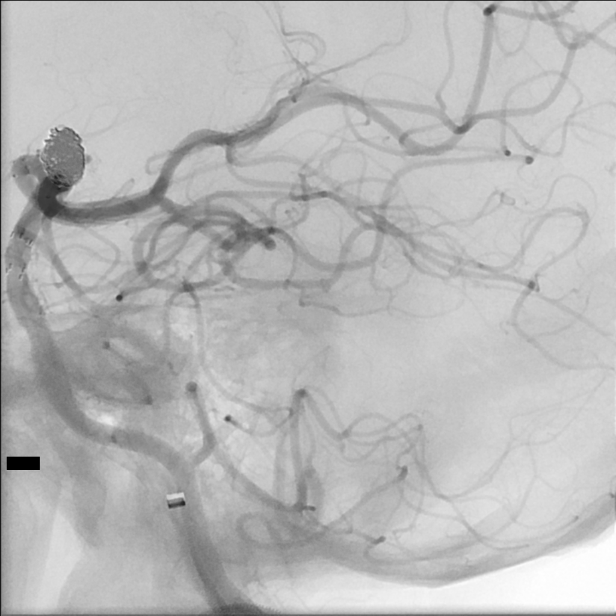

[11 of 24 positions shown; findings below may reference images not displayed]

APPROACH:

The technical aspects of the procedure as well as its potential
risks and benefits were reviewed with the patient. These risks
included but were not limited to bleeding, infection, allergic
reaction, damage to organs/vital structures, stroke, inability to
repair vessel/place stent, delayed aneurysm rupture, and
catastrophic outcomes of heart attack, coma, and death. With the
understanding of these risks, informed consent was obtained and
witnessed.

The patient was placed in the supine position on the angiography
table and the skin of the right groin was prepped in the usual
sterile fashion. The procedure was performed under general
anesthesia provided by the anesthesiology department.

An 8 French sheath was introduced in the right femoral artery using
Seldinger technique.

HEPARIN: 5555 Units

FLUOROSCOPY TIME: 57.3 combined AP and Lateral minutes.

CONTRAST: 60cc Omnipaque 300

CATHETERS AND WIRES:

180 cm 0.035" glidewire

6-French 80cm Shuttle sheath

6-French Shuttle Select JB-1 catheter

0.072" Navien guidecatheter

Excelsior J5-SX microcatheter

Synchro 2 standard microwire (x2)

Excelsior SQ-T4 microcatheter

COILS USED (MRI compatible):

Target 3D 4 mm x 8 cm

Target 360 Nano 3 mm x 4 cm (NOT DEPLOYED)

Target 360 Nano 2 mm x 4 cm

Target 360 Nano 2 mm x 3 cm

Target 360 Nano 2 mm x 3 cm

Target 360 Nano 2.5 mm x 4 cm

Target 360 Nano 2 mm x 4 cm

Target 360 Ultra 1.5 mm x 2 cm

Target 360 Ultra 1 mm x 3 cm (NOT DEPLOYED)

Target 360 Ultra 1.5 mm x 4 cm

Target 360 Ultra 1 mm x 2 cm

STENTS USED:

Neuroform ODA3 3.5mm x 15mm

Neuroform ODA3 3.5mm x 20mm

VESSELS CATHETERIZED:

Right common femoral artery

Left vertebral artery

Basilar artery

Right P3 (highest order)

Left P3 (highest order)

VESSELS STUDIED:

Left vertebral artery, head: magnified obliques (pre-embolization)

Left vertebral artery, head: magnified obliques (during coiling,
series [DATE])

Left vertebral artery, head: magnified obliques (immediate
post-embolization, series 10)

Left vertebral artery, head: AP, lateral (final control)

PROCEDURAL NARRATIVE:

Under real-time fluoroscopy, the shuttle select catheter was
advanced into the left subclavian artery over an 0.035" guidewire.
The guide sheath was then advanced into the proximal left subclavian
artery. After roadmap was taken, the select catheter was removed,
and the J5-SX and SQ-T4 microcatheter's were introduced in coaxial
fashion through the Navien guide catheter which was introduced into
the guide sheath. Over microwire, the XT 27 and XT 17 were advanced
into the V3 segment. The guide catheter was then tracked into the V3
segment.

The guide sheath was placed at the origin of the left vertebral
artery.

Under roadmap guidance, the XT 27 and XT 17 were navigated into the
proximal basilar artery. The guide catheter was then tracked over
the microcatheters into the intracranial vertebral artery.
Pre-embolization angiography was then taken. After adequate working
angles were obtained, the XT 27 microcatheter was navigated into the
P3 segment on the right side. The XT 17 microcatheter was then
navigated into the basilar aneurysm recurrence. Microwire was
removed from the XT 27, and the 15 mm Neuroform stent was introduced
and deployed from the proximal P2 segment into the mid basilar
artery. The XT 27 was then tracked over the delivery wire into the
distal basilar artery, within the lumen of the stent. The delivery
wire was removed, and the microwire reintroduced. Over the
microwire, the XT 27 was then tracked into the left P3 segment. The
second stent was then deployed from the left P2 into the more
proximal mid basilar artery. Care was taken to maintain aneurysmal
position of the XT 17 catheter.

After stent deployment, the above coils were sequentially deployed
with periodic angiography taken to document adequate coil position
and vessel patency. Once coiling was completed, the microcatheters
were removed, and immediate postembolization angiogram was taken.
The anatomy and guide catheter and the guide sheath were then
withdrawn, with the guide catheter placed in the mid to V2 segment.
Final control angiogram was taken.

The Navien catheter and sheath were then synchronously removed
without incident. The 8-Fr sheath was exchanged for a 7 French
ExoSeal closure device which was deployed successfully with good
hemostasis.

INTERPRETATION:

Right femoral artery:

The right femoral artery is unremarkable, arterial sheath in
adequate position.

Left vertebral artery, head (pre-embolization):

Injection reveals the presence of a widely patent vertebral artery.
This leads to a widely patent basilar artery that terminates in
bilateral P1. The previously seen basilar aneurysm recurrence is
again demonstrated measuring approximately 4 x 5 mm. This appears to
be due to a combination of coil compaction and aneurysm regrowth.
The parenchymal and venous phases are normal. The venous sinuses are
widely patent.

Left vertebral artery, head (during coiling):

Injections reveal progressive exclusion of the aneurysm recurrence.
Stent position is stable extending from the mid basilar artery into
the proximal P2 segments bilaterally. No filling defects are seen to
suggest local thrombotic complication.

Left vertebral artery, head (immediate post embolization):

The left vertebral artery is widely patent, and leads to a patent
basilar artery which terminates normally and bilateral P1's. There
is contrast reflux into the contralateral vertebral artery which is
also widely patent. Stent position is stable extending from the mid
basilar artery into bilateral proximal P2's. Coronary masses stable
within the aneurysm, without any residual aneurysm filling seen. The
distal segments of the posterior cerebral artery appear patent,
without any filling defects.

Left vertebral artery, head (final control):

Left vertebral artery is widely patent which leads to a patent
basilar artery. The basilar bifurcation is normal, as are the distal
segments of the posterior cerebral arteries. Stent and coil position
is stable. There is no aneurysm filling seen. No filling defects are
seen to suggest thrombus. Capillary phase does not demonstrate any
profusion deficits. Venous phase is unremarkable.

DISPOSITION:

The procedure was well tolerated and no early complications were
observed.

The patient was extubated in the angiography suite. Upon extubation,
patient was following commands and moving all extremities well. The
patient was transferred to the PACU for further care.
IMPRESSION: 1. Successful stent supported coil embolization of a recurrent
basilar artery aneurysm without local thrombotic or distal embolic
complication. No aneurysm filling is seen post-embolization.

## 2017-09-04 ENCOUNTER — Other Ambulatory Visit (HOSPITAL_COMMUNITY): Payer: Self-pay | Admitting: Neurosurgery

## 2017-09-04 DIAGNOSIS — I609 Nontraumatic subarachnoid hemorrhage, unspecified: Secondary | ICD-10-CM

## 2017-10-30 ENCOUNTER — Other Ambulatory Visit (HOSPITAL_COMMUNITY): Payer: Self-pay | Admitting: Neurosurgery

## 2017-10-30 DIAGNOSIS — I609 Nontraumatic subarachnoid hemorrhage, unspecified: Secondary | ICD-10-CM

## 2017-12-14 ENCOUNTER — Other Ambulatory Visit: Payer: Self-pay

## 2017-12-14 ENCOUNTER — Emergency Department (HOSPITAL_COMMUNITY)
Admission: EM | Admit: 2017-12-14 | Discharge: 2017-12-14 | Disposition: A | Discharge status: Home / Self Care | Payer: Medicaid Other | Attending: Emergency Medicine | Admitting: Emergency Medicine

## 2017-12-14 DIAGNOSIS — R350 Frequency of micturition: Secondary | ICD-10-CM | POA: Diagnosis present

## 2017-12-14 DIAGNOSIS — Z7984 Long term (current) use of oral hypoglycemic drugs: Secondary | ICD-10-CM | POA: Diagnosis not present

## 2017-12-14 DIAGNOSIS — E119 Type 2 diabetes mellitus without complications: Secondary | ICD-10-CM | POA: Diagnosis not present

## 2017-12-14 LAB — CBC
HCT: 41.1 % (ref 39.0–52.0)
Hemoglobin: 14.3 g/dL (ref 13.0–17.0)
MCH: 35.2 pg — AB (ref 26.0–34.0)
MCHC: 34.8 g/dL (ref 30.0–36.0)
MCV: 101.2 fL — AB (ref 80.0–100.0)
PLATELETS: 115 10*3/uL — AB (ref 150–400)
RBC: 4.06 MIL/uL — AB (ref 4.22–5.81)
RDW: 12.2 % (ref 11.5–15.5)
WBC: 4.6 10*3/uL (ref 4.0–10.5)
nRBC: 0 % (ref 0.0–0.2)

## 2017-12-14 LAB — COMPREHENSIVE METABOLIC PANEL
ALT: 32 U/L (ref 0–44)
ANION GAP: 8 (ref 5–15)
AST: 36 U/L (ref 15–41)
Albumin: 3.7 g/dL (ref 3.5–5.0)
Alkaline Phosphatase: 134 U/L — ABNORMAL HIGH (ref 38–126)
BUN: 15 mg/dL (ref 6–20)
CALCIUM: 9.2 mg/dL (ref 8.9–10.3)
CHLORIDE: 100 mmol/L (ref 98–111)
CO2: 24 mmol/L (ref 22–32)
CREATININE: 1.11 mg/dL (ref 0.61–1.24)
Glucose, Bld: 454 mg/dL — ABNORMAL HIGH (ref 70–99)
Potassium: 4.1 mmol/L (ref 3.5–5.1)
Sodium: 132 mmol/L — ABNORMAL LOW (ref 135–145)
Total Bilirubin: 1 mg/dL (ref 0.3–1.2)
Total Protein: 8.4 g/dL — ABNORMAL HIGH (ref 6.5–8.1)

## 2017-12-14 LAB — I-STAT VENOUS BLOOD GAS, ED
ACID-BASE EXCESS: 1 mmol/L (ref 0.0–2.0)
Bicarbonate: 26.7 mmol/L (ref 20.0–28.0)
O2 SAT: 76 %
TCO2: 28 mmol/L (ref 22–32)
pCO2, Ven: 44.1 mmHg (ref 44.0–60.0)
pH, Ven: 7.39 (ref 7.250–7.430)
pO2, Ven: 41 mmHg (ref 32.0–45.0)

## 2017-12-14 LAB — URINALYSIS, ROUTINE W REFLEX MICROSCOPIC
BILIRUBIN URINE: NEGATIVE
Bacteria, UA: NONE SEEN
Ketones, ur: 20 mg/dL — AB
Leukocytes, UA: NEGATIVE
Nitrite: NEGATIVE
PH: 6 (ref 5.0–8.0)
Protein, ur: NEGATIVE mg/dL
SPECIFIC GRAVITY, URINE: 1.033 — AB (ref 1.005–1.030)

## 2017-12-14 LAB — HEMOGLOBIN A1C
HEMOGLOBIN A1C: 12 % — AB (ref 4.8–5.6)
MEAN PLASMA GLUCOSE: 297.7 mg/dL

## 2017-12-14 LAB — CBG MONITORING, ED
GLUCOSE-CAPILLARY: 241 mg/dL — AB (ref 70–99)
Glucose-Capillary: 475 mg/dL — ABNORMAL HIGH (ref 70–99)

## 2017-12-14 MED ORDER — METFORMIN HCL 500 MG PO TABS: 500.0000 mg | ORAL_TABLET | Freq: Two times a day (BID) | ORAL | 1 refills | Status: DC

## 2017-12-14 MED ORDER — SODIUM CHLORIDE 0.9 % IV BOLUS (SEPSIS)
1000.0000 mL | Freq: Once | INTRAVENOUS | Status: AC
  Administered 2017-12-14: 1000 mL via INTRAVENOUS

## 2017-12-14 MED ORDER — INSULIN ASPART 100 UNIT/ML ~~LOC~~ SOLN
6.0000 [IU] | Freq: Once | SUBCUTANEOUS | Status: AC
  Administered 2017-12-14: 6 [IU] via INTRAVENOUS

## 2017-12-14 MED ORDER — SODIUM CHLORIDE 0.9 % IV SOLN
1000.0000 mL | INTRAVENOUS | Status: DC
  Administered 2017-12-14: 1000 mL via INTRAVENOUS

## 2017-12-14 NOTE — ED Provider Notes (Signed)
[facility]   CSN: [id number] Arrival date & time: 12/14/17  1634     History   Chief Complaint Chief Complaint  Patient presents with  . Urinary Frequency  . Hyperglycemia    HPI Richard Davenport is a 57 y.o. male.  HPI Patient presented to the emergency room for evaluation of hyperglycemia.  Patient has noticed increasing thirst and urinary frequency for the last couple of weeks.  His family members went and purchased a glucometer today.  They checked it and it was 585.  Patient does not have a history of diabetes.  He denies any trouble with fevers or chills.  No cough.  No shortness of breath.  No vomiting or diarrhea.  And denies any other complaints.  Patient does not have a primary care doctor and has not seen a doctor in a couple of years. Past Medical History:  Diagnosis Date  . Headache   . Stroke (HCC)   . Subarachnoid hemorrhage [facility](HCC)     Patient Active Problem List   Diagnosis Date Noted  . AKI (acute kidney injury) (HCC) 08/27/2013  . Dizziness and giddiness 08/27/2013  . TIA (transient ischemic attack) 08/26/2013  . 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 Acute respiratory failure with hypoxia (HCC) 10/10/2012  . SAH (subarachnoid hemorrhage) (HCC) 10/09/2012  . Altered mental status 10/09/2012    Past Surgical History:  Procedure Laterality Date  . ANEURYSM COILING     for bleed  . RADIOLOGY WITH ANESTHESIA N/A 10/09/2012   Procedure: RADIOLOGY WITH ANESTHESIA;  Surgeon: Lisbeth RenshawNeelesh Nundkumar, MD;  [facility];  Service: Radiology;  Laterality: N/A;  . RADIOLOGY WITH ANESTHESIA N/A 05/27/2013   Procedure: RADIOLOGY WITH ANESTHESIA;  Surgeon: Lisbeth RenshawNeelesh Nundkumar, MD;  Location: [facility] OR;  Service: Radiology;  Laterality: N/A;        Home Medications    Prior to Admission medications   Medication Sig Start Date End Date Taking? Authorizing Provider  ibuprofen (ADVIL,MOTRIN) 200 MG tablet Take  800-1,200 mg by mouth daily as needed for headache or moderate pain.     [provider]  metFORMIN (GLUCOPHAGE) 500 MG tablet Take 1 tablet (500 mg total) by mouth 2 (two) times daily. 12/14/17   Linwood DibblesKnapp, Alaney Witter, MD  pantoprazole (PROTONIX) 20 MG tablet Take 1 tablet (20 mg total) by mouth daily. 07/09/15   Arby BarrettePfeiffer, Marcy, MD  polyethylene glycol (MIRALAX / GLYCOLAX) packet Take 17 g by mouth daily. 07/09/15   Arby BarrettePfeiffer, Marcy, MD  Probiotic Product (PROBIOTIC PO) Take 1 tablet by mouth daily as needed (for digestion).    [provider]  traMADol (ULTRAM) 50 MG tablet Take 50 mg by mouth daily.    [provider]  traMADol (ULTRAM) 50 MG tablet Take 1 tablet (50 mg total) by mouth every 6 (six) hours as needed (You may take 1-2 tablets every 6 hours as needed for pain.). 07/09/15   Arby BarrettePfeiffer, Marcy, MD    Family History Family History  Problem Relation Age of Onset  . Hypertension Mother   . Hypertension Father     Social History Social History   Tobacco Use  . Smoking status: Never Smoker  . Smokeless tobacco: Never Used  Substance Use Topics  . Alcohol use: No  . Drug use: No     Allergies   Pork-derived products   Review of Systems Review of Systems  All other systems reviewed and are negative.    Physical Exam Updated Vital Signs  BP 138/84   Pulse 73   Temp 98.5 F (36.9 C) (Oral)   Resp 16   Ht 1.727 m (5\' 8" )   Wt 88.9 kg   SpO2 99%   BMI 29.80 kg/m   Physical Exam  Constitutional: He appears well-developed and well-nourished. No distress.  HENT:  Head: Normocephalic and atraumatic.  Right Ear: External ear normal.  Left Ear: External ear normal.  Eyes: Conjunctivae are normal. Right eye exhibits no discharge. Left eye exhibits no discharge. No scleral icterus.  Neck: Neck supple. No tracheal deviation present.  Cardiovascular: Normal rate, regular rhythm and intact distal pulses.  Pulmonary/Chest: Effort normal and breath sounds  normal. No stridor. No respiratory distress. He has no wheezes. He has no rales.  Abdominal: Soft. Bowel sounds are normal. He exhibits no distension. There is no tenderness. There is no rebound and no guarding.  Musculoskeletal: He exhibits no edema or tenderness.  Neurological: He is alert. He has normal strength. No cranial nerve deficit (no facial droop, extraocular movements intact, no slurred speech) or sensory deficit. He exhibits normal muscle tone. He displays no seizure activity. Coordination normal.  Skin: Skin is warm and dry. No rash noted.  Psychiatric: He has a normal mood and affect.  Nursing note and vitals reviewed.    ED Treatments / Results  Labs (all labs ordered are listed, but only abnormal results are displayed) Labs Reviewed  CBC - Abnormal; Notable for the following components:      Result Value   RBC 4.06 (*)    MCV 101.2 (*)    MCH 35.2 (*)    Platelets 115 (*)    All other components within normal limits  COMPREHENSIVE METABOLIC PANEL - Abnormal; Notable for the following components:   Sodium 132 (*)    Glucose, Bld 454 (*)    Total Protein 8.4 (*)    Alkaline Phosphatase 134 (*)    All other components within normal limits  HEMOGLOBIN A1C - Abnormal; Notable for the following components:   Hgb A1c MFr Bld 12.0 (*)    All other components within normal limits  URINALYSIS, ROUTINE W REFLEX MICROSCOPIC - Abnormal; Notable for the following components:   Color, Urine STRAW (*)    Specific Gravity, Urine 1.033 (*)    Glucose, UA >=500 (*)    Hgb urine dipstick SMALL (*)    Ketones, ur 20 (*)    All other components within normal limits  CBG MONITORING, ED - Abnormal; Notable for the following components:   Glucose-Capillary 475 (*)    All other components within normal limits  CBG MONITORING, ED - Abnormal; Notable for the following components:   Glucose-Capillary 241 (*)    All other components within normal limits  I-STAT VENOUS BLOOD GAS, ED     EKG None  Radiology No results found.  Procedures Procedures (including critical care time)  Medications Ordered in ED Medications  sodium chloride 0.9 % bolus 1,000 mL (0 mLs Intravenous Stopped 12/14/17 1803)    Followed by  sodium chloride 0.9 % bolus 1,000 mL (0 mLs Intravenous Stopped 12/14/17 1803)    Followed by  0.9 %  sodium chloride infusion (1,000 mLs Intravenous New Bag/Given 12/14/17 1734)  insulin aspart (novoLOG) injection 6 Units (6 Units Intravenous Given 12/14/17 1827)     Initial Impression / Assessment and Plan / ED Course  I have reviewed the triage vital signs and the nursing notes.  Pertinent labs & imaging results that were  available during my care of the patient were reviewed by me and considered in my medical decision making (see chart for details).  Clinical Course as of Dec 15 1955  Wynelle Link Dec 14, 2017  1803 Blood sugar elevated at 475.     [JK]    Clinical Course User Index [JK] Linwood Dibbles, MD    She presented to the emergency room for evaluation of hyperglycemia.  His laboratory tests are consistent with new onset diabetes.  His A1c is elevated indicating that this likely has been going on for a few months.  Patient appears stable.  His blood sugar improved with fluids.  Discharged home with a prescription for metformin.  I stressed the importance of outpatient follow-up with a primary care doctor.  Final Clinical Impressions(s) / ED Diagnoses   Final diagnoses:  Type 2 diabetes mellitus without complication, without long-term current use of insulin [facility])    ED Discharge Orders         Ordered    metFORMIN (GLUCOPHAGE) 500 MG tablet  2 times daily     12/14/17 1956           Linwood Dibbles, MD 12/14/17 1958

## 2017-12-14 NOTE — ED Notes (Signed)
Patient verbalizes understanding of discharge instructions. Opportunity for questioning and answers were provided. Armband removed by staff, pt discharged from ED home via POV with family. 

## 2017-12-14 NOTE — Discharge Instructions (Addendum)
Take the medications as prescribed, make sure to follow-up with her primary care doctor for further evaluation and treatment of your diabetes.  These review the discharge instructions that talk about appropriate diet and other information about diabetes

## 2017-12-14 NOTE — ED Triage Notes (Signed)
Pt to ED from home for increased urination and thirst x2 weeks. Pt bought glucometer today and checked CBG for 585 with no hx of diabetes.

## 2017-12-17 ENCOUNTER — Ambulatory Visit (HOSPITAL_COMMUNITY)
Admission: RE | Admit: 2017-12-17 | Discharge: 2017-12-17 | Disposition: A | Discharge status: Home / Self Care | Payer: Medicaid Other | Source: Ambulatory Visit | Attending: Neurosurgery | Admitting: Neurosurgery

## 2017-12-17 DIAGNOSIS — I671 Cerebral aneurysm, nonruptured: Secondary | ICD-10-CM | POA: Insufficient documentation

## 2017-12-17 DIAGNOSIS — I609 Nontraumatic subarachnoid hemorrhage, unspecified: Secondary | ICD-10-CM | POA: Diagnosis present

## 2018-04-08 ENCOUNTER — Encounter (HOSPITAL_COMMUNITY): Payer: Self-pay | Admitting: Emergency Medicine

## 2018-04-08 ENCOUNTER — Inpatient Hospital Stay (HOSPITAL_COMMUNITY): Payer: Medicaid Other

## 2018-04-08 ENCOUNTER — Emergency Department (HOSPITAL_COMMUNITY): Payer: Medicaid Other

## 2018-04-08 ENCOUNTER — Inpatient Hospital Stay (HOSPITAL_COMMUNITY)
Admission: EM | Admit: 2018-04-08 | Discharge: 2018-04-10 | DRG: 041 | Disposition: A | Discharge status: Home / Self Care | Payer: Medicaid Other | Attending: Neurology | Admitting: Neurology

## 2018-04-08 DIAGNOSIS — E785 Hyperlipidemia, unspecified: Secondary | ICD-10-CM | POA: Diagnosis present

## 2018-04-08 DIAGNOSIS — R29810 Facial weakness: Secondary | ICD-10-CM | POA: Diagnosis present

## 2018-04-08 DIAGNOSIS — Z6831 Body mass index (BMI) 31.0-31.9, adult: Secondary | ICD-10-CM | POA: Diagnosis not present

## 2018-04-08 DIAGNOSIS — E669 Obesity, unspecified: Secondary | ICD-10-CM | POA: Diagnosis present

## 2018-04-08 DIAGNOSIS — Z8673 Personal history of transient ischemic attack (TIA), and cerebral infarction without residual deficits: Secondary | ICD-10-CM | POA: Diagnosis not present

## 2018-04-08 DIAGNOSIS — D696 Thrombocytopenia, unspecified: Secondary | ICD-10-CM | POA: Diagnosis present

## 2018-04-08 DIAGNOSIS — Z7984 Long term (current) use of oral hypoglycemic drugs: Secondary | ICD-10-CM | POA: Diagnosis not present

## 2018-04-08 DIAGNOSIS — E1165 Type 2 diabetes mellitus with hyperglycemia: Secondary | ICD-10-CM | POA: Diagnosis present

## 2018-04-08 DIAGNOSIS — G450 Vertebro-basilar artery syndrome: Secondary | ICD-10-CM | POA: Diagnosis present

## 2018-04-08 DIAGNOSIS — I639 Cerebral infarction, unspecified: Secondary | ICD-10-CM

## 2018-04-08 DIAGNOSIS — I609 Nontraumatic subarachnoid hemorrhage, unspecified: Secondary | ICD-10-CM

## 2018-04-08 DIAGNOSIS — W19XXXA Unspecified fall, initial encounter: Secondary | ICD-10-CM

## 2018-04-08 DIAGNOSIS — Z91018 Allergy to other foods: Secondary | ICD-10-CM

## 2018-04-08 DIAGNOSIS — T148XXA Other injury of unspecified body region, initial encounter: Secondary | ICD-10-CM

## 2018-04-08 DIAGNOSIS — E119 Type 2 diabetes mellitus without complications: Secondary | ICD-10-CM

## 2018-04-08 DIAGNOSIS — I6312 Cerebral infarction due to embolism of basilar artery: Secondary | ICD-10-CM | POA: Diagnosis present

## 2018-04-08 DIAGNOSIS — Z9889 Other specified postprocedural states: Secondary | ICD-10-CM

## 2018-04-08 DIAGNOSIS — G8194 Hemiplegia, unspecified affecting left nondominant side: Secondary | ICD-10-CM | POA: Diagnosis present

## 2018-04-08 DIAGNOSIS — I6381 Other cerebral infarction due to occlusion or stenosis of small artery: Secondary | ICD-10-CM | POA: Diagnosis present

## 2018-04-08 DIAGNOSIS — R29715 NIHSS score 15: Secondary | ICD-10-CM | POA: Diagnosis present

## 2018-04-08 DIAGNOSIS — Z8679 Personal history of other diseases of the circulatory system: Secondary | ICD-10-CM

## 2018-04-08 DIAGNOSIS — Y92009 Unspecified place in unspecified non-institutional (private) residence as the place of occurrence of the external cause: Secondary | ICD-10-CM

## 2018-04-08 LAB — DIFFERENTIAL
ABS IMMATURE GRANULOCYTES: 0.02 10*3/uL (ref 0.00–0.07)
Basophils Absolute: 0 10*3/uL (ref 0.0–0.1)
Basophils Relative: 1 %
Eosinophils Absolute: 0.4 10*3/uL (ref 0.0–0.5)
Eosinophils Relative: 6 %
Immature Granulocytes: 0 %
Lymphocytes Relative: 40 %
Lymphs Abs: 2.3 10*3/uL (ref 0.7–4.0)
Monocytes Absolute: 0.5 10*3/uL (ref 0.1–1.0)
Monocytes Relative: 8 %
Neutro Abs: 2.5 10*3/uL (ref 1.7–7.7)
Neutrophils Relative %: 45 %

## 2018-04-08 LAB — RAPID URINE DRUG SCREEN, HOSP PERFORMED
Amphetamines: NOT DETECTED
Barbiturates: NOT DETECTED
Benzodiazepines: NOT DETECTED
Cocaine: NOT DETECTED
Opiates: NOT DETECTED
TETRAHYDROCANNABINOL: NOT DETECTED

## 2018-04-08 LAB — URINALYSIS, ROUTINE W REFLEX MICROSCOPIC
Bilirubin Urine: NEGATIVE
Glucose, UA: NEGATIVE mg/dL
Hgb urine dipstick: NEGATIVE
KETONES UR: NEGATIVE mg/dL
Leukocytes,Ua: NEGATIVE
Nitrite: NEGATIVE
PROTEIN: NEGATIVE mg/dL
Specific Gravity, Urine: 1.021 (ref 1.005–1.030)
pH: 7 (ref 5.0–8.0)

## 2018-04-08 LAB — PROTIME-INR
INR: 1.4 — ABNORMAL HIGH (ref 0.8–1.2)
Prothrombin Time: 16.7 seconds — ABNORMAL HIGH (ref 11.4–15.2)

## 2018-04-08 LAB — COMPREHENSIVE METABOLIC PANEL
ALT: 26 U/L (ref 0–44)
AST: 35 U/L (ref 15–41)
Albumin: 3.3 g/dL — ABNORMAL LOW (ref 3.5–5.0)
Alkaline Phosphatase: 107 U/L (ref 38–126)
Anion gap: 7 (ref 5–15)
BUN: 16 mg/dL (ref 6–20)
CO2: 24 mmol/L (ref 22–32)
Calcium: 9 mg/dL (ref 8.9–10.3)
Chloride: 106 mmol/L (ref 98–111)
Creatinine, Ser: 1.07 mg/dL (ref 0.61–1.24)
GFR calc Af Amer: >60 mL/min (ref >60)
GFR calc non Af Amer: >60 mL/min (ref >60)
Glucose, Bld: 141 mg/dL — ABNORMAL HIGH (ref 70–99)
Potassium: 3.6 mmol/L (ref 3.5–5.1)
SODIUM: 137 mmol/L (ref 135–145)
Total Bilirubin: 0.5 mg/dL (ref 0.3–1.2)
Total Protein: 7.3 g/dL (ref 6.5–8.1)

## 2018-04-08 LAB — I-STAT CREATININE, ED: Creatinine, Ser: 1 mg/dL (ref 0.61–1.24)

## 2018-04-08 LAB — APTT: aPTT: 31 seconds (ref 24–36)

## 2018-04-08 LAB — CBC
HEMATOCRIT: 38.5 % — AB (ref 39.0–52.0)
Hemoglobin: 12.9 g/dL — ABNORMAL LOW (ref 13.0–17.0)
MCH: 34.8 pg — ABNORMAL HIGH (ref 26.0–34.0)
MCHC: 33.5 g/dL (ref 30.0–36.0)
MCV: 103.8 fL — ABNORMAL HIGH (ref 80.0–100.0)
Platelets: 100 10*3/uL — ABNORMAL LOW (ref 150–400)
RBC: 3.71 MIL/uL — ABNORMAL LOW (ref 4.22–5.81)
RDW: 12.7 % (ref 11.5–15.5)
WBC: 5.6 10*3/uL (ref 4.0–10.5)
nRBC: 0 % (ref 0.0–0.2)

## 2018-04-08 LAB — ETHANOL: Alcohol, Ethyl (B): <10 mg/dL (ref <10)

## 2018-04-08 LAB — CBG MONITORING, ED: Glucose-Capillary: 231 mg/dL — ABNORMAL HIGH (ref 70–99)

## 2018-04-08 MED ORDER — ALTEPLASE (STROKE) FULL DOSE INFUSION
0.9000 mg/kg | Freq: Once | INTRAVENOUS | Status: AC
  Administered 2018-04-08: 84.9 mg via INTRAVENOUS
  Filled 2018-04-08: qty 100

## 2018-04-08 MED ORDER — STROKE: EARLY STAGES OF RECOVERY BOOK
Freq: Once | Status: AC
  Administered 2018-04-09: 15:00:00

## 2018-04-08 MED ORDER — ACETAMINOPHEN 650 MG RE SUPP: 650.0000 mg | RECTAL | Status: DC | PRN

## 2018-04-08 MED ORDER — ACETAMINOPHEN 325 MG PO TABS
650.0000 mg | ORAL_TABLET | ORAL | Status: DC | PRN
  Administered 2018-04-09 – 2018-04-10 (×4): 650 mg via ORAL
  Filled 2018-04-08 (×4): qty 2

## 2018-04-08 MED ORDER — SODIUM CHLORIDE 0.9 % IV SOLN: 50.0000 mL | Freq: Once | INTRAVENOUS | Status: DC

## 2018-04-08 MED ORDER — SENNOSIDES-DOCUSATE SODIUM 8.6-50 MG PO TABS: 1.0000 | ORAL_TABLET | Freq: Every evening | ORAL | Status: DC | PRN

## 2018-04-08 MED ORDER — SODIUM CHLORIDE 0.9 % IV SOLN
INTRAVENOUS | Status: DC
  Administered 2018-04-09: 15:00:00 via INTRAVENOUS

## 2018-04-08 MED ORDER — PANTOPRAZOLE SODIUM 40 MG IV SOLR
40.0000 mg | Freq: Every day | INTRAVENOUS | Status: DC
  Administered 2018-04-08 – 2018-04-09 (×2): 40 mg via INTRAVENOUS
  Filled 2018-04-08 (×2): qty 40

## 2018-04-08 MED ORDER — IOHEXOL 350 MG/ML SOLN
125.0000 mL | Freq: Once | INTRAVENOUS | Status: AC | PRN
  Administered 2018-04-08: 115 mL via INTRAVENOUS

## 2018-04-08 MED ORDER — ACETAMINOPHEN 160 MG/5ML PO SOLN: 650.0000 mg | ORAL | Status: DC | PRN

## 2018-04-08 NOTE — H&P (Signed)
Chief Complaint: Left-sided weakness, dizziness  History obtained from: Patient and Chart    HPI:                                                                                                                                       Richard Davenport is an 58 y.o. 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  Last known normal was around 5:30 PM and patient said he started to feel dizzy and lightheaded.  Around 6:30 PM patient fell and EMS was called.  On assessment patient was not moving his left arm and appeared to neglect left side.  On arrival, patient was awake and answering questions appropriately.  He was plegic in the left upper extremity and was weaker in the left lower extremity.  Had sensory and visual neglect.  NIH stroke scale was 15.  Stat CT head was performed which showed no hemorrhage.  CT angiogram was performed which showed no large vessel occlusion and CT perfusion was negative as well.  Stat MRI brain was also obtained to confirm whether this was a stroke as he was higher risk for hemorrhage with TPA given prior history of subarachnoid hemorrhage.  Date last known well: 3.18.20 Time last known well: 5:30 PM tPA Given: yes NIHSS: 15 Baseline MRS 1     Past Medical History:  Diagnosis Date  . Headache   . Stroke (HCC)   . Subarachnoid hemorrhage [facility])     Past Surgical History:  Procedure Laterality Date  . ANEURYSM COILING     for bleed  . RADIOLOGY WITH ANESTHESIA N/A 10/09/2012   Procedure: RADIOLOGY WITH ANESTHESIA;  Surgeon: Lisbeth Renshaw, MD;  Location: [facility] OR;  Service: Radiology;  Laterality: N/A;  . RADIOLOGY WITH ANESTHESIA N/A 05/27/2013   Procedure: RADIOLOGY WITH ANESTHESIA;  Surgeon: Lisbeth Renshaw, MD;  Location: [facility] OR;  Service: Radiology;  Laterality: N/A;    Family History  Problem Relation Age of Onset  .  Hypertension Mother   . Hypertension Father    Social History:  reports that he has never smoked. He has never used smokeless tobacco. He reports that he does not drink alcohol or use drugs.  Allergies:  Allergies  Allergen Reactions  . Pork-Derived Products Other (See Comments)    Patient is Muslim and PREFERS TO NOT TAKE ANY PORK OR MEAT PRODUCTS (only fish)    Medications:  I reviewed home medications   ROS:                                                                                                                                     14 systems reviewed and negative except above    Examination:                                                                                                      General: Appears well-developed  Psych: Affect appropriate to situation Eyes: No scleral injection HENT: No OP obstrucion Head: Normocephalic.  Cardiovascular: Normal rate and regular rhythm.  Respiratory: Effort normal and breath sounds normal to anterior ascultation GI: Soft.  No distension. There is no tenderness.  Skin: WDI    Neurological Examination Mental Status: Alert, oriented, thought content appropriate.  Speech fluent without evidence of aphasia. Able to follow 3 step commands without difficulty. Cranial Nerves: II: Visual fields : Left homonymous hemianopsia III,IV, VI: ptosis not present, gaze  preference to the right side but able to cross midline, pupils equal, round, reactive to light and accommodation V,VII: Left facial droop VIII: hearing normal bilaterally IX,X: uvula rises symmetrically XI: bilateral shoulder shrug XII: midline tongue extension Motor: Right : Upper extremity   5/5    Left:     Upper extremity   0/5  Lower extremity   5/5     Lower extremity   3/5 Tone and bulk:normal tone throughout; no atrophy noted Sensory: Sensory  neglect in the left upper and lower extremity to light touch, withdraws to painful stimuli on the left side Deep Tendon Reflexes: 2+ and symmetric throughout Plantars: Right: downgoing   Left: downgoing Cerebellar: normal finger-to-nose in the right side Gait: Unable to assess due to weakness   Lab Results: Basic Metabolic Panel: Recent Labs  Lab 04/08/18 2007-05-26 04/08/18 2010  NA 137  --   K 3.6  --   CL 106  --   CO2 24  --   GLUCOSE 141*  --   BUN 16  --   CREATININE 1.07 1.00  CALCIUM 9.0  --     CBC: Recent Labs  Lab 04/08/18 2009  WBC 5.6  NEUTROABS 2.5  HGB 12.9*  HCT 38.5*  MCV 103.8*  PLT 100*    Coagulation Studies: Recent Labs    04/08/18 May 26, 2007  LABPROT 16.7*  INR 1.4*    Imaging: Ct Angio Head W Or Wo Contrast  Result Date: 04/08/2018 CLINICAL DATA:  Code stroke patient.  Right MCA syndrome clinically. EXAM: CT ANGIOGRAPHY HEAD AND NECK CT PERFUSION BRAIN TECHNIQUE: Multidetector CT imaging of the head and neck was performed using the standard protocol during bolus administration of intravenous contrast. Multiplanar CT image reconstructions and MIPs were obtained to evaluate the vascular anatomy. Carotid stenosis measurements (when applicable) are obtained utilizing NASCET criteria, using the distal internal carotid diameter as the denominator. Multiphase CT imaging of the brain was performed following IV bolus contrast injection. Subsequent parametric perfusion maps were calculated using RAPID software. CONTRAST:  OMNIPAQUE IOHEXOL 350 MG/ML SOLN COMPARISON:  Head CT earlier same day FINDINGS: CTA NECK FINDINGS Aortic arch: No atherosclerotic change of the arch. Branching pattern is normal. Right carotid system: Common carotid artery widely patent to the bifurcation. No atherosclerotic change at the carotid bifurcation. Cervical ICA widely patent. Left carotid system: Common carotid artery widely patent to the bifurcation. Carotid bifurcation is normal.  Cervical ICA widely patent. Vertebral arteries: Patent and normal.  Left vertebral is dominant. Skeleton: Cervical spondylosis most pronounced at C5-6. Other neck: No mass or lymphadenopathy.  Normal Upper chest: Normal Review of the MIP images confirms the above findings CTA HEAD FINDINGS Anterior circulation: Both internal carotid arteries are patent through the skull base and siphon regions. No siphon calcification. The anterior and middle cerebral vessels are patent without proximal stenosis, aneurysm or vascular malformation. No missing branch vessels are identified. Posterior circulation: Both vertebral arteries are patent through the foramen magnum to the basilar. There is a basilar stent in place related 2 coiling of a basilar tip aneurysm. Extensive streak artifact present secondary to that. Posterior cerebral arteries seem to show normal flow distally. Venous sinuses: Patent and normal. Anatomic variants: None significant. Delayed phase: No abnormal enhancement. Review of the MIP images confirms the above findings CT Brain Perfusion Findings: CBF (<30%) Volume: 21mL Perfusion (Tmax>6.0s) volume: 70mL Mismatch Volume: Infarction Location:None IMPRESSION: No large or medium vessel occlusion. The patient does not appear to manifest atherosclerotic disease. 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 No complications seen relative to that. Streak artifact secondary to the dense coils. Normal perfusion study. Electronically Signed   By: Paulina Fusi M.D.   On: 04/08/2018 20:32   Ct Angio Neck W Or Wo Contrast  Result Date: 04/08/2018 CLINICAL DATA:  Code stroke patient.  Right MCA syndrome clinically. EXAM: CT ANGIOGRAPHY HEAD AND NECK CT PERFUSION BRAIN TECHNIQUE: Multidetector CT imaging of the head and neck was performed using the standard protocol during bolus administration of intravenous contrast. Multiplanar CT image reconstructions and MIPs were obtained to evaluate the vascular  anatomy. Carotid stenosis measurements (when applicable) are obtained utilizing NASCET criteria, using the distal internal carotid diameter as the denominator. Multiphase CT imaging of the brain was performed following IV bolus contrast injection. Subsequent parametric perfusion maps were calculated using RAPID software. CONTRAST:  OMNIPAQUE IOHEXOL 350 MG/ML SOLN COMPARISON:  Head CT earlier same day FINDINGS: CTA NECK FINDINGS Aortic arch: No atherosclerotic change of the arch. Branching pattern is normal. Right carotid system: Common carotid artery widely patent to the bifurcation. No atherosclerotic change at the carotid bifurcation. Cervical ICA widely patent. Left carotid system: Common carotid artery widely patent to the bifurcation. Carotid bifurcation is normal. Cervical ICA widely patent. Vertebral arteries: Patent and normal.  Left vertebral is dominant. Skeleton: Cervical spondylosis most pronounced at C5-6. Other neck: No mass or lymphadenopathy.  Normal Upper chest: Normal Review of the MIP images confirms the  above findings CTA HEAD FINDINGS Anterior circulation: Both internal carotid arteries are patent through the skull base and siphon regions. No siphon calcification. The anterior and middle cerebral vessels are patent without proximal stenosis, aneurysm or vascular malformation. No missing branch vessels are identified. Posterior circulation: Both vertebral arteries are patent through the foramen magnum to the basilar. There is a basilar stent in place related 2 coiling of a basilar tip aneurysm. Extensive streak artifact present secondary to that. Posterior cerebral arteries seem to show normal flow distally. Venous sinuses: Patent and normal. Anatomic variants: None significant. Delayed phase: No abnormal enhancement. Review of the MIP images confirms the above findings CT Brain Perfusion Findings: CBF (<30%) Volume: 0mL Perfusion (Tmax>6.0s) volume: 0mL Mismatch Volume: Infarction  Location:None IMPRESSION: No large or medium vessel occlusion. The patient does not appear to manifest atherosclerotic disease. 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 No complications seen relative to that. Streak artifact secondary to the dense coils. Normal perfusion study. Electronically Signed   By: Paulina Fusi M.D.   On: 04/08/2018 20:32   Mr Brain Wo Contrast  Result Date: 04/08/2018 CLINICAL DATA:  Code stroke. Right MCA syndrome. Negative CT imaging EXAM: MRI HEAD WITHOUT CONTRAST TECHNIQUE: Multiplanar, multiecho pulse sequences of the brain and surrounding structures were obtained without intravenous contrast. COMPARISON:  CT studies same. FINDINGS: Brain: Diffusion imaging shows a cm early acute infarction in the right medial thalamus. No other acute insult. Elsewhere, brainstem and cerebellum are. Cerebral hemispheres show mild chronic small-vessel ischemic change in the forceps major regions. There is a small focus of gliosis in the right frontal white matter related to a previous ventriculostomy. No hydrocephalus. No extra-axial collection Vascular: Major vessels at the base of the brain show flow. Skull and upper cervical spine: Negative Sinuses/Orbits: Widespread sinus inflammatory disease particularly in the ethmoid regions. Orbits negative. Other: None IMPRESSION: 1 cm acute infarction in medial right thalamus. Electronically Signed   By: Paulina Fusi M.D.   On: 04/08/2018 20:56   Ct C-spine No Charge  Result Date: 04/08/2018 CLINICAL DATA:  Focal neural deficit with a clinical concern for stroke. EXAM: CT CERVICAL SPINE WITHOUT CONTRAST TECHNIQUE: Multidetector CT imaging of the cervical spine was performed without intravenous contrast. Multiplanar CT image reconstructions were also generated. COMPARISON:  Head CT obtained earlier today. Neck CTA obtained concurrently with study, including the scout images. FINDINGS: Alignment: Reversal the normal cervical lordosis. No  subluxations. Skull base and vertebrae: No acute fracture. No primary bone lesion or focal pathologic process. Soft tissues and spinal canal: No prevertebral fluid or swelling. No visible canal hematoma. Disc levels: Multilevel degenerative changes, most pronounced at the C5-6 level with moderate posterior spur formation. Upper chest: Clear lung apices. Other: Intravascular contrast, described separately. IMPRESSION: 1. No cervical spine fracture or subluxation. 2. Reversal of the normal cervical lordosis. 3. Multilevel degenerative changes. Electronically Signed   By: Beckie Salts M.D.   On: 04/08/2018 20:35   Ct Cerebral Perfusion W Contrast  Result Date: 04/08/2018 CLINICAL DATA:  Code stroke patient.  Right MCA syndrome clinically. EXAM: CT ANGIOGRAPHY HEAD AND NECK CT PERFUSION BRAIN TECHNIQUE: Multidetector CT imaging of the head and neck was performed using the standard protocol during bolus administration of intravenous contrast. Multiplanar CT image reconstructions and MIPs were obtained to evaluate the vascular anatomy. Carotid stenosis measurements (when applicable) are obtained utilizing NASCET criteria, using the distal internal carotid diameter as the denominator. Multiphase CT imaging of the brain was  performed following IV bolus contrast injection. Subsequent parametric perfusion maps were calculated using RAPID software. CONTRAST:  OMNIPAQUE IOHEXOL 350 MG/ML SOLN COMPARISON:  Head CT earlier same day FINDINGS: CTA NECK FINDINGS Aortic arch: No atherosclerotic change of the arch. Branching pattern is normal. Right carotid system: Common carotid artery widely patent to the bifurcation. No atherosclerotic change at the carotid bifurcation. Cervical ICA widely patent. Left carotid system: Common carotid artery widely patent to the bifurcation. Carotid bifurcation is normal. Cervical ICA widely patent. Vertebral arteries: Patent and normal.  Left vertebral is dominant. Skeleton: Cervical  spondylosis most pronounced at C5-6. Other neck: No mass or lymphadenopathy.  Normal Upper chest: Normal Review of the MIP images confirms the above findings CTA HEAD FINDINGS Anterior circulation: Both internal carotid arteries are patent through the skull base and siphon regions. No siphon calcification. The anterior and middle cerebral vessels are patent without proximal stenosis, aneurysm or vascular malformation. No missing branch vessels are identified. Posterior circulation: Both vertebral arteries are patent through the foramen magnum to the basilar. There is a basilar stent in place related 2 coiling of a basilar tip aneurysm. Extensive streak artifact present secondary to that. Posterior cerebral arteries seem to show normal flow distally. Venous sinuses: Patent and normal. Anatomic variants: None significant. Delayed phase: No abnormal enhancement. Review of the MIP images confirms the above findings CT Brain Perfusion Findings: CBF (<30%) Volume: 0mL Perfusion (Tmax>6.0s) volume: 0mL Mismatch Volume: Infarction Location:None IMPRESSION: No large or medium vessel occlusion. The patient does not appear to manifest atherosclerotic disease. 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 No complications seen relative to that. Streak artifact secondary to the dense coils. Normal perfusion study. Electronically Signed   By: Paulina Fusi M.D.   On: 04/08/2018 20:32   Ct Head Code Stroke Wo Contrast  Result Date: 04/08/2018 CLINICAL DATA:  Code stroke. Last seen normal 1830 hours. Left-sided weakness, facial droop and slurred speech. EXAM: CT HEAD WITHOUT CONTRAST TECHNIQUE: Contiguous axial images were obtained from the base of the skull through the vertex without intravenous contrast. COMPARISON:  12/17/2017 FINDINGS: Brain: Mild generalized atrophy. Small area of low-density in the right frontal lobe related to a previous ventriculostomy. No other abnormal brain parenchymal finding.  Vascular: Stent assisted coiling of a posterior circulation aneurysm. No acute vascular finding by CT. Skull: Normal except for the right frontal burr hole. Sinuses/Orbits: Widespread opacification of the ethmoid and sphenoid sinuses. Left division of the frontal sinus is opacified. Mucosal thickening of the paranasal maxillary sinuses. Other: None ASPECTS (Alberta Stroke Program Early CT Score) - Ganglionic level infarction (caudate, lentiform nuclei, internal capsule, insula, M1-M3 cortex): 7 - Supraganglionic infarction (M4-M6 cortex): 3 Total score (0-10 with 10 being normal): 10 IMPRESSION: 1. No acute finding by CT. 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 A low-density in the right frontal white matter related to a previous ventriculostomy. 2. ASPECTS is 10 3. These results were communicated to Dr. Laurence Slate at 8:10 pmon 3/18/2020by text page via the [facility] messaging system. Electronically Signed   By: Paulina Fusi M.D.   On: 04/08/2018 20:12     ASSESSMENT AND PLAN  58 year old male with history of basilar aneurysm rupture in 09/2012 status post coiling and stenting presents with sudden onset dizziness, left hemiparesis and neglect.  His exam was initially concerning for large vessel occlusion and therefore we performed a CT angiogram as well as CT perfusion- however this was negative.  A stat MRI  was performed and showed that patient had acute right thalamic infarction.  MRI Brain was performed prior to TPA administration as possible partial seizure was in the differential given history of right frontal scar from prior ventriculostomy patient was high risk for hemorrhagic conversion from TPA given prior history of basilar artery aneurysm, SAH.  We discussed in great detail regarding risk versus benefit in administering IV TPA after confirming acute stroke with MRI brain.  Patient was within 4-1/2 hours from last known normal and a candidate given NIH stroke scale of 15 with  disabling deficits.  Discussed with him that while normally risk of symptomatic intracranial hemorrhage is about 3-6%, in his case it could potentially be higher.  His aneurysm is secured and he has not had any recurrent bleeding since the last 5 years and therefore not an absolute contraindication for TPA.  His NIH stroke scale was high and this stroke could potentially leave him permanently disabled.  Offering TPA could reduce his disability by about 30%.  After discussing this with the patient he agreed to receive IV TPA.  He appeared to be alert, aware of the situation and competent in his medical decision making.   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  Risk factors: Basilar stent, prior TIA, hyperglycemia (diabetes mellitus) Etiology: Small vessel disease  Plan #Admit to neuro ICU for close monitoring #Repeat CT head in 24 hours to rule out hemorrhage #Transthoracic Echo  #Resume antiplatelet therapy after 24 hours #Start or continue Atorvastatin 40 mg/other high intensity statin # BP goal: permissive HTN upto 180 /105 mmHg # HBAIC and Lipid profile # Telemetry monitoring # Frequent neuro checks # stroke swallow screen  #Hyperglycemia -Suspect diabetes mellitus, currently does not appear to be on any medication -HbA1c pending -Low carb diet  #History of 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  However around 10 PM, I received a call by the nurse the patient appeared more somnolent and NIH stroke scale increased to 7.  Stat CT head was performed to rule out hemorrhage-I reviewed the head CT there was no evidence of hemorrhage.   This patient is neurologically critically ill due to acute right thalamic stroke status post TPA he is at risk for significant risk of neurological  worsening from cerebral edema,  death from brain herniation, heart failure, hemorrhagic conversion, infection, respiratory failure and seizure. This patient's care requires constant monitoring of vital signs, hemodynamics, respiratory and cardiac monitoring, review of multiple databases, neurological assessment, discussion with family, other specialists and medical decision making of high complexity.  I spent 90 minutes of neurocritical time in the care of this patient.     Please page stroke NP  Or  PA  Or MD from 8am -4 pm  as this patient from this time will be  followed by the stroke.   You can look them up on [email or web address]  Password [facility]    Mourad Cwikla [facility] Pager Number [phone]

## 2018-04-08 NOTE — ED Notes (Signed)
Dr Aroor notified of change in last NIHSS

## 2018-04-08 NOTE — ED Provider Notes (Signed)
[facility] EMERGENCY DEPARTMENT Provider Note   CSN: [id number] Arrival date & time: 04/08/18  9767  An emergency department physician performed an initial assessment on this suspected stroke patient at 70.  History   Chief Complaint Chief Complaint  Patient presents with   Code Stroke    HPI Richard Davenport is a 58 y.o. male.     58 year old male with prior medical history as detailed below presents for evaluation as a code stroke.  Patient was reportedly last known well at approximately 630pm.  He had an episode of dizziness which was followed by acute onset of left-sided weakness, left facial droop, and slurred speech .  Patient arrives as a code stroke.  He is seen on arrival by neurology.  Following his initial evaluation by neurology (Dr. Laurence Slate) patient is deemed to be a candidate for TPA.    The history is provided by the patient and medical records.  Illness  Location:  Acute left sided weakness Severity:  Severe Onset quality:  Sudden Duration: onset at 1830. Timing:  Constant Progression:  Unchanged Chronicity:  New   Past Medical History:  Diagnosis Date   Headache    Stroke (HCC)    Subarachnoid hemorrhage (HCC)     Patient Active Problem List   Diagnosis Date Noted   Ischemic stroke (HCC) 04/08/2018   AKI (acute kidney injury) (HCC) 08/27/2013   Dizziness and giddiness 08/27/2013   TIA (transient ischemic attack) 08/26/2013   Cerebral aneurysm, nonruptured 05/27/2013   Acute respiratory failure with hypoxia (HCC) 10/10/2012   SAH (subarachnoid hemorrhage) (HCC) 10/09/2012   Altered mental status 10/09/2012    Past Surgical History:  Procedure Laterality Date   ANEURYSM COILING     for bleed   RADIOLOGY WITH ANESTHESIA N/A 10/09/2012   Procedure: RADIOLOGY WITH ANESTHESIA;  Surgeon: Lisbeth Renshaw, MD;  Location: [facility] OR;  Service: Radiology;  Laterality: N/A;   RADIOLOGY WITH ANESTHESIA N/A 05/27/2013   Procedure:  RADIOLOGY WITH ANESTHESIA;  Surgeon: Lisbeth Renshaw, MD;  Location: [facility] OR;  Service: Radiology;  Laterality: N/A;        Home Medications    Prior to Admission medications   Medication Sig Start Date End Date Taking? Authorizing Provider  metFORMIN (GLUCOPHAGE) 500 MG tablet Take 1 tablet (500 mg total) by mouth 2 (two) times daily. 12/14/17  Yes Linwood Dibbles, MD    Family History Family History  Problem Relation Age of Onset   Hypertension Mother    Hypertension Father     Social History Social History   Tobacco Use   Smoking status: Never Smoker   Smokeless tobacco: Never Used  Substance Use Topics   Alcohol use: No   Drug use: No     Allergies   Pork-derived products   Review of Systems Review of Systems  Unable to perform ROS: Acuity of condition     Physical Exam Updated Vital Signs BP 122/82    Pulse 75    Temp 98 F (36.7 C) (Oral)    Resp 17    Wt 94.3 kg    SpO2 99%    BMI 31.63 kg/m   Physical Exam Vitals signs and nursing note reviewed.  Constitutional:      General: He is not in acute distress.    Appearance: He is well-developed.  HENT:     Head: Normocephalic and atraumatic.  Eyes:     Conjunctiva/sclera: Conjunctivae normal.     Pupils: Pupils are equal, round, and  reactive to light.  Neck:     Musculoskeletal: Normal range of motion and neck supple.  Cardiovascular:     Rate and Rhythm: Normal rate and regular rhythm.     Heart sounds: Normal heart sounds.  Pulmonary:     Effort: Pulmonary effort is normal. No respiratory distress.     Breath sounds: Normal breath sounds.  Abdominal:     General: There is no distension.     Palpations: Abdomen is soft.     Tenderness: There is no abdominal tenderness.  Musculoskeletal: Normal range of motion.        General: No deformity.  Skin:    General: Skin is warm and dry.  Neurological:     Mental Status: He is alert.     Comments: Acute left hemiplegia, left facial droop       ED Treatments / Results  Labs (all labs ordered are listed, but only abnormal results are displayed) Labs Reviewed  PROTIME-INR - Abnormal; Notable for the following components:      Result Value   Prothrombin Time 16.7 (*)    INR 1.4 (*)    All other components within normal limits  CBC - Abnormal; Notable for the following components:   RBC 3.71 (*)    Hemoglobin 12.9 (*)    HCT 38.5 (*)    MCV 103.8 (*)    MCH 34.8 (*)    Platelets 100 (*)    All other components within normal limits  COMPREHENSIVE METABOLIC PANEL - Abnormal; Notable for the following components:   Glucose, Bld 141 (*)    Albumin 3.3 (*)    All other components within normal limits  CBG MONITORING, ED - Abnormal; Notable for the following components:   Glucose-Capillary 231 (*)    All other components within normal limits  ETHANOL  APTT  DIFFERENTIAL  RAPID URINE DRUG SCREEN, HOSP PERFORMED  URINALYSIS, ROUTINE W REFLEX MICROSCOPIC  HIV ANTIBODY (ROUTINE TESTING W REFLEX)  HEMOGLOBIN A1C  LIPID PANEL  I-STAT CREATININE, ED    EKG None  Radiology Ct Angio Head W Or Wo Contrast  Result Date: 04/08/2018 CLINICAL DATA:  Code stroke patient.  Right MCA syndrome clinically. EXAM: CT ANGIOGRAPHY HEAD AND NECK CT PERFUSION BRAIN TECHNIQUE: Multidetector CT imaging of the head and neck was performed using the standard protocol during bolus administration of intravenous contrast. Multiplanar CT image reconstructions and MIPs were obtained to evaluate the vascular anatomy. Carotid stenosis measurements (when applicable) are obtained utilizing NASCET criteria, using the distal internal carotid diameter as the denominator. Multiphase CT imaging of the brain was performed following IV bolus contrast injection. Subsequent parametric perfusion maps were calculated using RAPID software. CONTRAST:  OMNIPAQUE IOHEXOL 350 MG/ML SOLN COMPARISON:  Head CT earlier same day FINDINGS: CTA NECK FINDINGS Aortic arch:  No atherosclerotic change of the arch. Branching pattern is normal. Right carotid system: Common carotid artery widely patent to the bifurcation. No atherosclerotic change at the carotid bifurcation. Cervical ICA widely patent. Left carotid system: Common carotid artery widely patent to the bifurcation. Carotid bifurcation is normal. Cervical ICA widely patent. Vertebral arteries: Patent and normal.  Left vertebral is dominant. Skeleton: Cervical spondylosis most pronounced at C5-6. Other neck: No mass or lymphadenopathy.  Normal Upper chest: Normal Review of the MIP images confirms the above findings CTA HEAD FINDINGS Anterior circulation: Both internal carotid arteries are patent through the skull base and siphon regions. No siphon calcification. The anterior and middle cerebral vessels are  patent without proximal stenosis, aneurysm or vascular malformation. No missing branch vessels are identified. Posterior circulation: Both vertebral arteries are patent through the foramen magnum to the basilar. There is a basilar stent in place related 2 coiling of a basilar tip aneurysm. Extensive streak artifact present secondary to that. Posterior cerebral arteries seem to show normal flow distally. Venous sinuses: Patent and normal. Anatomic variants: None significant. Delayed phase: No abnormal enhancement. Review of the MIP images confirms the above findings CT Brain Perfusion Findings: CBF (<30%) Volume: 0mL Perfusion (Tmax>6.0s) volume: 0mL Mismatch Volume: Infarction Location:None IMPRESSION: No large or medium vessel occlusion. The patient does not appear to manifest atherosclerotic disease. 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 No complications seen relative to that. Streak artifact secondary to the dense coils. Normal perfusion study. Electronically Signed   By: Paulina Fusi M.D.   On: 04/08/2018 20:32   Ct Angio Neck W Or Wo Contrast  Result Date: 04/08/2018 CLINICAL DATA:  Code stroke  patient.  Right MCA syndrome clinically. EXAM: CT ANGIOGRAPHY HEAD AND NECK CT PERFUSION BRAIN TECHNIQUE: Multidetector CT imaging of the head and neck was performed using the standard protocol during bolus administration of intravenous contrast. Multiplanar CT image reconstructions and MIPs were obtained to evaluate the vascular anatomy. Carotid stenosis measurements (when applicable) are obtained utilizing NASCET criteria, using the distal internal carotid diameter as the denominator. Multiphase CT imaging of the brain was performed following IV bolus contrast injection. Subsequent parametric perfusion maps were calculated using RAPID software. CONTRAST:  OMNIPAQUE IOHEXOL 350 MG/ML SOLN COMPARISON:  Head CT earlier same day FINDINGS: CTA NECK FINDINGS Aortic arch: No atherosclerotic change of the arch. Branching pattern is normal. Right carotid system: Common carotid artery widely patent to the bifurcation. No atherosclerotic change at the carotid bifurcation. Cervical ICA widely patent. Left carotid system: Common carotid artery widely patent to the bifurcation. Carotid bifurcation is normal. Cervical ICA widely patent. Vertebral arteries: Patent and normal.  Left vertebral is dominant. Skeleton: Cervical spondylosis most pronounced at C5-6. Other neck: No mass or lymphadenopathy.  Normal Upper chest: Normal Review of the MIP images confirms the above findings CTA HEAD FINDINGS Anterior circulation: Both internal carotid arteries are patent through the skull base and siphon regions. No siphon calcification. The anterior and middle cerebral vessels are patent without proximal stenosis, aneurysm or vascular malformation. No missing branch vessels are identified. Posterior circulation: Both vertebral arteries are patent through the foramen magnum to the basilar. There is a basilar stent in place related 2 coiling of a basilar tip aneurysm. Extensive streak artifact present secondary to that. Posterior  cerebral arteries seem to show normal flow distally. Venous sinuses: Patent and normal. Anatomic variants: None significant. Delayed phase: No abnormal enhancement. Review of the MIP images confirms the above findings CT Brain Perfusion Findings: CBF (<30%) Volume: 0mL Perfusion (Tmax>6.0s) volume: 0mL Mismatch Volume: Infarction Location:None IMPRESSION: No large or medium vessel occlusion. The patient does not appear to manifest atherosclerotic disease. 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 No complications seen relative to that. Streak artifact secondary to the dense coils. Normal perfusion study. Electronically Signed   By: Paulina Fusi M.D.   On: 04/08/2018 20:32   Mr Brain Wo Contrast  Result Date: 04/08/2018 CLINICAL DATA:  Code stroke. Right MCA syndrome. Negative CT imaging EXAM: MRI HEAD WITHOUT CONTRAST TECHNIQUE: Multiplanar, multiecho pulse sequences of the brain and surrounding structures were obtained without intravenous contrast. COMPARISON:  CT  studies same. FINDINGS: Brain: Diffusion imaging shows a cm early acute infarction in the right medial thalamus. No other acute insult. Elsewhere, brainstem and cerebellum are. Cerebral hemispheres show mild chronic small-vessel ischemic change in the forceps major regions. There is a small focus of gliosis in the right frontal white matter related to a previous ventriculostomy. No hydrocephalus. No extra-axial collection Vascular: Major vessels at the base of the brain show flow. Skull and upper cervical spine: Negative Sinuses/Orbits: Widespread sinus inflammatory disease particularly in the ethmoid regions. Orbits negative. Other: None IMPRESSION: 1 cm acute infarction in medial right thalamus. Electronically Signed   By: Paulina Fusi M.D.   On: 04/08/2018 20:56   Ct C-spine No Charge  Result Date: 04/08/2018 CLINICAL DATA:  Focal neural deficit with a clinical concern for stroke. EXAM: CT CERVICAL SPINE WITHOUT CONTRAST  TECHNIQUE: Multidetector CT imaging of the cervical spine was performed without intravenous contrast. Multiplanar CT image reconstructions were also generated. COMPARISON:  Head CT obtained earlier today. Neck CTA obtained concurrently with study, including the scout images. FINDINGS: Alignment: Reversal the normal cervical lordosis. No subluxations. Skull base and vertebrae: No acute fracture. No primary bone lesion or focal pathologic process. Soft tissues and spinal canal: No prevertebral fluid or swelling. No visible canal hematoma. Disc levels: Multilevel degenerative changes, most pronounced at the C5-6 level with moderate posterior spur formation. Upper chest: Clear lung apices. Other: Intravascular contrast, described separately. IMPRESSION: 1. No cervical spine fracture or subluxation. 2. Reversal of the normal cervical lordosis. 3. Multilevel degenerative changes. Electronically Signed   By: Beckie Salts M.D.   On: 04/08/2018 20:35   Ct Cerebral Perfusion W Contrast  Result Date: 04/08/2018 CLINICAL DATA:  Code stroke patient.  Right MCA syndrome clinically. EXAM: CT ANGIOGRAPHY HEAD AND NECK CT PERFUSION BRAIN TECHNIQUE: Multidetector CT imaging of the head and neck was performed using the standard protocol during bolus administration of intravenous contrast. Multiplanar CT image reconstructions and MIPs were obtained to evaluate the vascular anatomy. Carotid stenosis measurements (when applicable) are obtained utilizing NASCET criteria, using the distal internal carotid diameter as the denominator. Multiphase CT imaging of the brain was performed following IV bolus contrast injection. Subsequent parametric perfusion maps were calculated using RAPID software. CONTRAST:  OMNIPAQUE IOHEXOL 350 MG/ML SOLN COMPARISON:  Head CT earlier same day FINDINGS: CTA NECK FINDINGS Aortic arch: No atherosclerotic change of the arch. Branching pattern is normal. Right carotid system: Common carotid artery  widely patent to the bifurcation. No atherosclerotic change at the carotid bifurcation. Cervical ICA widely patent. Left carotid system: Common carotid artery widely patent to the bifurcation. Carotid bifurcation is normal. Cervical ICA widely patent. Vertebral arteries: Patent and normal.  Left vertebral is dominant. Skeleton: Cervical spondylosis most pronounced at C5-6. Other neck: No mass or lymphadenopathy.  Normal Upper chest: Normal Review of the MIP images confirms the above findings CTA HEAD FINDINGS Anterior circulation: Both internal carotid arteries are patent through the skull base and siphon regions. No siphon calcification. The anterior and middle cerebral vessels are patent without proximal stenosis, aneurysm or vascular malformation. No missing branch vessels are identified. Posterior circulation: Both vertebral arteries are patent through the foramen magnum to the basilar. There is a basilar stent in place related 2 coiling of a basilar tip aneurysm. Extensive streak artifact present secondary to that. Posterior cerebral arteries seem to show normal flow distally. Venous sinuses: Patent and normal. Anatomic variants: None significant. Delayed phase: No abnormal enhancement. Review of the MIP  images confirms the above findings CT Brain Perfusion Findings: CBF (<30%) Volume: 56mL Perfusion (Tmax>6.0s) volume: 60mL Mismatch Volume: Infarction Location:None IMPRESSION: No large or medium vessel occlusion. The patient does not appear to manifest atherosclerotic disease. 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 No complications seen relative to that. Streak artifact secondary to the dense coils. Normal perfusion study. Electronically Signed   By: Paulina Fusi M.D.   On: 04/08/2018 20:32   Ct Head Code Stroke Wo Contrast  Result Date: 04/08/2018 CLINICAL DATA:  Code stroke. Last seen normal 1830 hours. Left-sided weakness, facial droop and slurred speech. EXAM: CT HEAD WITHOUT  CONTRAST TECHNIQUE: Contiguous axial images were obtained from the base of the skull through the vertex without intravenous contrast. COMPARISON:  12/17/2017 FINDINGS: Brain: Mild generalized atrophy. Small area of low-density in the right frontal lobe related to a previous ventriculostomy. No other abnormal brain parenchymal finding. Vascular: Stent assisted coiling of a posterior circulation aneurysm. No acute vascular finding by CT. Skull: Normal except for the right frontal burr hole. Sinuses/Orbits: Widespread opacification of the ethmoid and sphenoid sinuses. Left division of the frontal sinus is opacified. Mucosal thickening of the paranasal maxillary sinuses. Other: None ASPECTS (Alberta Stroke Program Early CT Score) - Ganglionic level infarction (caudate, lentiform nuclei, internal capsule, insula, M1-M3 cortex): 7 - Supraganglionic infarction (M4-M6 cortex): 3 Total score (0-10 with 10 being normal): 10 IMPRESSION: 1. No acute finding by CT. 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 A low-density in the right frontal white matter related to a previous ventriculostomy. 2. ASPECTS is 10 3. These results were communicated to Dr. Laurence Slate at 8:10 pmon 3/18/2020by text page via the [facility] messaging system. Electronically Signed   By: Paulina Fusi M.D.   On: 04/08/2018 20:12    Procedures Procedures (including critical care time)  Medications Ordered in ED Medications  alteplase (ACTIVASE) 1 mg/mL infusion 84.9 mg (84.9 mg Intravenous New Bag/Given 04/08/18 2103)    Followed by  0.9 %  sodium chloride infusion (has no administration in time range)   stroke: mapping our early stages of recovery book (has no administration in time range)  0.9 %  sodium chloride infusion (has no administration in time range)  acetaminophen (TYLENOL) tablet 650 mg (has no administration in time range)    Or  acetaminophen (TYLENOL) solution 650 mg (has no administration in time range)    Or    acetaminophen (TYLENOL) suppository 650 mg (has no administration in time range)  senna-docusate (Senokot-S) tablet 1 tablet (has no administration in time range)  pantoprazole (PROTONIX) injection 40 mg (has no administration in time range)  iohexol (OMNIPAQUE) 350 MG/ML injection 125 mL (115 mLs Intravenous Contrast Given 04/08/18 1855)     Initial Impression / Assessment and Plan / ED Course  I have reviewed the triage vital signs and the nursing notes.  Pertinent labs & imaging results that were available during my care of the patient were reviewed by me and considered in my medical decision making (see chart for details).        MDM  Screen complete  Patient is presenting as a code stroke.  Patient with acute onset left-sided hemiplegia and left facial droop.  Patient seen on arrival by neurology (Aroor).  He is a candidate for TPA.  Patient will be admitted to neurology service for further work-up and treatment.  Final Clinical Impressions(s) / ED Diagnoses   Final diagnoses:  Acute ischemic stroke (HCC)  ED Discharge Orders    None       Wynetta Fines, MD 04/08/18 2210

## 2018-04-08 NOTE — ED Triage Notes (Signed)
Pt LKW at 1830, when he began to feel dizzy.  He was in the bathroom when he stated he "passed out" waiting up still sitting on the toilet.  Pt has left sided weakness, slurred speech. facial drooping and left sided LVO positive. No observed injuries, does have c-collar on.

## 2018-04-08 NOTE — Progress Notes (Signed)
PHARMACIST CODE STROKE RESPONSE  Notified to mix tPA at 20:55 by Dr. Laurence Slate Delivered tPA to RN at 20:59  tPA dose = 8.5 mg bolus over 1 minute followed by 76.4 mg for a total dose of 84.9 mg over 1 hour  Issues/delays encountered (if applicable): Patient taken to CT scanner and then MRI prior to decision being made to administer tPA to patient.   Richard Davenport 04/08/18 9:08 PM

## 2018-04-08 NOTE — ED Notes (Signed)
Pt spouse placed in consult A

## 2018-04-08 NOTE — ED Notes (Signed)
Dr Aroor stated get blood after CT.

## 2018-04-09 ENCOUNTER — Other Ambulatory Visit: Payer: Self-pay

## 2018-04-09 ENCOUNTER — Inpatient Hospital Stay (HOSPITAL_COMMUNITY): Payer: Medicaid Other

## 2018-04-09 ENCOUNTER — Encounter (HOSPITAL_COMMUNITY): Payer: Self-pay

## 2018-04-09 DIAGNOSIS — I639 Cerebral infarction, unspecified: Secondary | ICD-10-CM

## 2018-04-09 LAB — HIV ANTIBODY (ROUTINE TESTING W REFLEX): HIV Screen 4th Generation wRfx: NONREACTIVE

## 2018-04-09 LAB — GLUCOSE, CAPILLARY
Glucose-Capillary: 144 mg/dL — ABNORMAL HIGH (ref 70–99)
Glucose-Capillary: 137 mg/dL — ABNORMAL HIGH (ref 70–99)

## 2018-04-09 LAB — LIPID PANEL
Cholesterol: 139 mg/dL (ref 0–200)
HDL: 48 mg/dL (ref >40)
LDL Cholesterol: 72 mg/dL (ref 0–99)
Total CHOL/HDL Ratio: 2.9 RATIO
Triglycerides: 96 mg/dL (ref <150)
VLDL: 19 mg/dL (ref 0–40)

## 2018-04-09 LAB — HEMOGLOBIN A1C
Hgb A1c MFr Bld: 5.9 % — ABNORMAL HIGH (ref 4.8–5.6)
MEAN PLASMA GLUCOSE: 122.63 mg/dL

## 2018-04-09 LAB — MRSA PCR SCREENING: MRSA by PCR: NEGATIVE

## 2018-04-09 LAB — ECHOCARDIOGRAM COMPLETE: Weight: 3328 oz

## 2018-04-09 MED ORDER — METFORMIN HCL 500 MG PO TABS
500.0000 mg | ORAL_TABLET | Freq: Two times a day (BID) | ORAL | Status: DC
  Administered 2018-04-09: 500 mg via ORAL
  Filled 2018-04-09: qty 1

## 2018-04-09 MED ORDER — PERFLUTREN LIPID MICROSPHERE
INTRAVENOUS | Status: AC
  Filled 2018-04-09: qty 10

## 2018-04-09 MED ORDER — PERFLUTREN LIPID MICROSPHERE
1.0000 mL | INTRAVENOUS | Status: AC | PRN
  Administered 2018-04-09: 2 mL via INTRAVENOUS
  Filled 2018-04-09: qty 10

## 2018-04-09 NOTE — Progress Notes (Signed)
Neuro MD paged about patient passing [facility] Screen. New orders received for carb modified diet. Will continue to monitor.

## 2018-04-09 NOTE — Progress Notes (Signed)
OT Cancellation Note  Patient Details Name: Richard Davenport MRN: [id number] DOB: [birth year]   Cancelled Treatment:    Reason Eval/Treat Not Completed: Active bedrest order.  Pt on strict bedrest.  Will check back. Jeani Hawking, OTR/L Acute Rehabilitation Services Pager [phone] Office [phone]   Jeani Hawking M 04/09/2018, 10:16 AM

## 2018-04-09 NOTE — Progress Notes (Signed)
Transcranial Doppler with bubbles has been completed. Refer to "CV Proc" under chart review to view preliminary results.  04/09/2018 3:39 PM Gertie Fey, MHA, RVT, RDCS, RDMS

## 2018-04-09 NOTE — Code Documentation (Signed)
LSN 1830 - + dizzy, fell in the bathroom, + LT sided weakness, slurred speech, facial weakness, NIH 15. 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 CT head negative for bleed, CTA/CTP - negative for LVO. Taken for STAT MRI -  + acute medial thalamic infarct. TPA started at 2103.

## 2018-04-09 NOTE — Evaluation (Signed)
Physical Therapy Evaluation Patient Details Name: Richard Davenport MRN: [id number] DOB: [birth year] Today's Date: 04/09/2018   History of Present Illness  58 yo admitted after syncope in bathroom with left weakness and dizziness with Rt thalamic infarct s/p tPA. PMhx: [facility] 2014  Clinical Impression  Pt in bed upon arrival and agreed to participate with therapy. Pt presents with some mild inattention/awareness of left side hitting a chair and a bed during ambulation. Pt presents with some mild cognitive impairments with impulsivity, decreased safety awareness and inconsistency with home set up. Pt would benefit from acute skilled therapy to further assess and improve cognition, awareness and higher level balance challenges for safe discharge home. Pt educated on plan to follow acutely and pt agreed.     Follow Up Recommendations No PT follow up    Equipment Recommendations  None recommended by PT    Recommendations for Other Services OT consult     Precautions / Restrictions Precautions Precautions: None      Mobility  Bed Mobility Overal bed mobility: Independent                Transfers Overall transfer level: Independent                  Ambulation/Gait Ambulation/Gait assistance: Min guard Gait Distance (Feet): 300 Feet Assistive device: None Gait Pattern/deviations: WFL(Within Functional Limits)   Gait velocity interpretation: >4.37 ft/sec, indicative of normal walking speed General Gait Details: pt ambulates with normal walking speed with no unsteadiness or LOB, pt able to step over obstacle but with very large step over, pt had two instances of hitting a chair and a bed on his left side with no awareness, pt denies any visual changes   Stairs Stairs: Yes Stairs assistance: Min guard Stair Management: One rail Left;Alternating pattern Number of Stairs: 1 General stair comments: on stairs pt denied having a handrail at home when he had previously stated he had  a left handrail, pt safe on one step but very impulsive and starting steps before therapist direction  Wheelchair Mobility    Modified Rankin (Stroke Patients Only) Modified Rankin (Stroke Patients Only) Pre-Morbid Rankin Score: No symptoms Modified Rankin: Moderate disability     Balance Overall balance assessment: Needs assistance Sitting-balance support: Feet unsupported;No upper extremity supported Sitting balance-Leahy Scale: Good Sitting balance - Comments: pt able to maintain sitting balance EOB while fixing his socks      Standing balance-Leahy Scale: Fair Standing balance comment: pt able to ambulate without AD but would benefit from higher level balance challenges               High Level Balance Comments: pt able to change speeds during ambulation, DGI should be completed           Pertinent Vitals/Pain Pain Assessment: No/denies pain    Home Living Family/patient expects to be discharged to:: Private residence Living Arrangements: Spouse/significant other;Children Available Help at Discharge: Family;Available 24 hours/day Type of Home: House Home Access: Stairs to enter Entrance Stairs-Rails: (pt later stated no handrail) Entrance Stairs-Number of Steps: 1 Home Layout: One level Home Equipment: None      Prior Function Level of Independence: Independent         Comments: 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[employer]   Dominant Hand: Right    Extremity/Trunk Assessment   Upper Extremity Assessment Upper Extremity Assessment: Defer to OT evaluation    Lower Extremity Assessment Lower Extremity Assessment: Overall [facility]  for tasks assessed(5/5 bil LE all myotomes)    Cervical / Trunk Assessment Cervical / Trunk Assessment: Normal  Communication   Communication: No difficulties  Cognition Arousal/Alertness: Awake/alert Behavior During Therapy: Impulsive Overall Cognitive Status: Impaired/Different from baseline Area of  Impairment: Problem solving;Safety/judgement                         Safety/Judgement: Decreased awareness of safety;Decreased awareness of deficits   Problem Solving: Slow processing;Requires verbal cues;Requires tactile cues        General Comments      Exercises     Assessment/Plan    PT Assessment Patient needs continued PT services  PT Problem List Decreased balance;Decreased cognition;Decreased activity tolerance;Decreased safety awareness;Decreased mobility       PT Treatment Interventions DME instruction;Functional mobility training;Balance training;Patient/family education;Gait training;Therapeutic activities;Neuromuscular re-education;Cognitive remediation    PT Goals (Current goals can be found in the Care Plan section)  Acute Rehab PT Goals Patient Stated Goal: return home PT Goal Formulation: With patient Time For Goal Achievement: 04/23/18 Potential to Achieve Goals: Good Additional Goals Additional Goal #1: pt will score >22 on DGI to demonstrate decreased fall risk    Frequency Min 3X/week   Barriers to discharge        Co-evaluation               AM-PAC PT "6 Clicks" Mobility  Outcome Measure Help needed turning from your back to your side while in a flat bed without using bedrails?: None Help needed moving from lying on your back to sitting on the side of a flat bed without using bedrails?: None Help needed moving to and from a bed to a chair (including a wheelchair)?: None Help needed standing up from a chair using your arms (e.g., wheelchair or bedside chair)?: A Little Help needed to walk in hospital room?: A Little Help needed climbing 3-5 steps with a railing? : A Little 6 Click Score: 21    End of Session Equipment Utilized During Treatment: Gait belt Activity Tolerance: Patient tolerated treatment well Patient left: in chair;with call bell/phone within reach Nurse Communication: Mobility status PT Visit Diagnosis: Other  abnormalities of gait and mobility (R26.89)    Time: 8208-1388 PT Time Calculation (min) (ACUTE ONLY): 17 min   Charges:   PT Evaluation $PT Eval Moderate Complexity: 1 Mod         Christophe Rising, [place] [phone]   Annalis Kaczmarczyk 04/09/2018, 2:11 PM

## 2018-04-09 NOTE — Progress Notes (Signed)
  Echocardiogram 2D Echocardiogram has been performed.  Richard Davenport 04/09/2018, 11:43 AM

## 2018-04-09 NOTE — Progress Notes (Addendum)
STROKE TEAM PROGRESS NOTE   INTERVAL HISTORY His daughter is at the bedside.  They reported he passed out in the bathroom yesterday, alone, unwitnessed. No hx of passing out. No heart irregularities. Daughter is chief Stage manager at [employer].  Vitals:   04/09/18 0600 04/09/18 0630 04/09/18 0700 04/09/18 0800  BP: 129/84 131/90 (!) 137/94 (!) 133/98  Pulse: (!) 58 (!) 56 (!) 59 67  Resp: (!) 23 14 13 17   Temp:      TempSrc:      SpO2: 100% 100% 99% 100%  Weight:        CBC:  Recent Labs  Lab 04/08/18 2009  WBC 5.6  NEUTROABS 2.5  HGB 12.9*  HCT 38.5*  MCV 103.8*  PLT 100*    Basic Metabolic Panel:  Recent Labs  Lab 04/08/18 2009 04/08/18 2010  NA 137  --   K 3.6  --   CL 106  --   CO2 24  --   GLUCOSE 141*  --   BUN 16  --   CREATININE 1.07 1.00  CALCIUM 9.0  --    Lipid Panel:     Component Value Date/Time   CHOL 139 04/09/2018 0526   TRIG 96 04/09/2018 0526   HDL 48 04/09/2018 0526   CHOLHDL 2.9 04/09/2018 0526   VLDL 19 04/09/2018 0526   LDLCALC 72 04/09/2018 0526   HgbA1c:  Lab Results  Component Value Date   HGBA1C 5.9 (H) 04/09/2018   Urine Drug Screen:     Component Value Date/Time   LABOPIA NONE DETECTED 04/08/2018 2000   COCAINSCRNUR NONE DETECTED 04/08/2018 2000   LABBENZ NONE DETECTED 04/08/2018 2000   AMPHETMU NONE DETECTED 04/08/2018 2000   THCU NONE DETECTED 04/08/2018 2000   LABBARB NONE DETECTED 04/08/2018 2000    Alcohol Level     Component Value Date/Time   ETH <10 04/08/2018 2009    IMAGING Ct Angio Head W Or Wo Contrast  Result Date: 04/08/2018 CLINICAL DATA:  Code stroke patient.  Right MCA syndrome clinically. EXAM: CT ANGIOGRAPHY HEAD AND NECK CT PERFUSION BRAIN TECHNIQUE: Multidetector CT imaging of the head and neck was performed using the standard protocol during bolus administration of intravenous contrast. Multiplanar CT image reconstructions and MIPs were obtained to evaluate the vascular anatomy.  Carotid stenosis measurements (when applicable) are obtained utilizing NASCET criteria, using the distal internal carotid diameter as the denominator. Multiphase CT imaging of the brain was performed following IV bolus contrast injection. Subsequent parametric perfusion maps were calculated using RAPID software. CONTRAST:  OMNIPAQUE IOHEXOL 350 MG/ML SOLN COMPARISON:  Head CT earlier same day FINDINGS: CTA NECK FINDINGS Aortic arch: No atherosclerotic change of the arch. Branching pattern is normal. Right carotid system: Common carotid artery widely patent to the bifurcation. No atherosclerotic change at the carotid bifurcation. Cervical ICA widely patent. Left carotid system: Common carotid artery widely patent to the bifurcation. Carotid bifurcation is normal. Cervical ICA widely patent. Vertebral arteries: Patent and normal.  Left vertebral is dominant. Skeleton: Cervical spondylosis most pronounced at C5-6. Other neck: No mass or lymphadenopathy.  Normal Upper chest: Normal Review of the MIP images confirms the above findings CTA HEAD FINDINGS Anterior circulation: Both internal carotid arteries are patent through the skull base and siphon regions. No siphon calcification. The anterior and middle cerebral vessels are patent without proximal stenosis, aneurysm or vascular malformation. No missing branch vessels are identified. Posterior circulation: Both vertebral arteries are patent through the foramen magnum  to the basilar. There is a basilar stent in place related 2 coiling of a basilar tip aneurysm. Extensive streak artifact present secondary to that. Posterior cerebral arteries seem to show normal flow distally. Venous sinuses: Patent and normal. Anatomic variants: None significant. Delayed phase: No abnormal enhancement. Review of the MIP images confirms the above findings CT Brain Perfusion Findings: CBF (<30%) Volume: 0mL Perfusion (Tmax>6.0s) volume: 0mL Mismatch Volume: Infarction  Location:None IMPRESSION: No large or medium vessel occlusion. The patient does not appear to manifest atherosclerotic disease. 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 No complications seen relative to that. Streak artifact secondary to the dense coils. Normal perfusion study. Electronically Signed   By: Paulina Fusi M.D.   On: 04/08/2018 20:32   Ct Head Wo Contrast  Result Date: 04/08/2018 CLINICAL DATA:  Initial evaluation for status post tPA. EXAM: CT HEAD WITHOUT CONTRAST TECHNIQUE: Contiguous axial images were obtained from the base of the skull through the vertex without intravenous contrast. COMPARISON:  Prior brain MRI from earlier the same day. FINDINGS: Brain: Streak artifact from prior stent assisted posterior circulation aneurysm. Prior small infarct involving the medial right thalamus not well seen given artifact. No intracranial hemorrhage or other complication status post tPA administration. No other acute or pelvic large vessel territory infarct. No mass effect or midline shift. Prior right frontal ventriculostomy tract noted. No hydrocephalus. No extra-axial fluid collection. Vascular: Streak artifact from stent an aneurysm coils. No appreciable hyperdense vessel. Skull: Scalp soft tissues demonstrate no acute finding. Right frontal burr hole noted. Sinuses/Orbits: Globes and orbital soft tissues within normal limits. Right gaze noted. Chronic pansinusitis again noted. Small right mastoid effusion, unchanged. Other: None. IMPRESSION: 1. Stable head CT. No intracranial hemorrhage or other complication status post tPA administration. Previously identified small right thalamic infarct not well visualized given streak artifact from stent assisted posterior circulation aneurysm coiling. 2. Sequelae of prior right frontal ventriculostomy. Electronically Signed   By: Rise Mu M.D.   On: 04/08/2018 22:54   Ct Angio Neck W Or Wo Contrast  Result Date:  04/08/2018 CLINICAL DATA:  Code stroke patient.  Right MCA syndrome clinically. EXAM: CT ANGIOGRAPHY HEAD AND NECK CT PERFUSION BRAIN TECHNIQUE: Multidetector CT imaging of the head and neck was performed using the standard protocol during bolus administration of intravenous contrast. Multiplanar CT image reconstructions and MIPs were obtained to evaluate the vascular anatomy. Carotid stenosis measurements (when applicable) are obtained utilizing NASCET criteria, using the distal internal carotid diameter as the denominator. Multiphase CT imaging of the brain was performed following IV bolus contrast injection. Subsequent parametric perfusion maps were calculated using RAPID software. CONTRAST:  OMNIPAQUE IOHEXOL 350 MG/ML SOLN COMPARISON:  Head CT earlier same day FINDINGS: CTA NECK FINDINGS Aortic arch: No atherosclerotic change of the arch. Branching pattern is normal. Right carotid system: Common carotid artery widely patent to the bifurcation. No atherosclerotic change at the carotid bifurcation. Cervical ICA widely patent. Left carotid system: Common carotid artery widely patent to the bifurcation. Carotid bifurcation is normal. Cervical ICA widely patent. Vertebral arteries: Patent and normal.  Left vertebral is dominant. Skeleton: Cervical spondylosis most pronounced at C5-6. Other neck: No mass or lymphadenopathy.  Normal Upper chest: Normal Review of the MIP images confirms the above findings CTA HEAD FINDINGS Anterior circulation: Both internal carotid arteries are patent through the skull base and siphon regions. No siphon calcification. The anterior and middle cerebral vessels are patent without proximal stenosis, aneurysm or vascular malformation.  No missing branch vessels are identified. Posterior circulation: Both vertebral arteries are patent through the foramen magnum to the basilar. There is a basilar stent in place related 2 coiling of a basilar tip aneurysm. Extensive streak artifact  present secondary to that. Posterior cerebral arteries seem to show normal flow distally. Venous sinuses: Patent and normal. Anatomic variants: None significant. Delayed phase: No abnormal enhancement. Review of the MIP images confirms the above findings CT Brain Perfusion Findings: CBF (<30%) Volume: 0mL Perfusion (Tmax>6.0s) volume: 0mL Mismatch Volume: Infarction Location:None IMPRESSION: No large or medium vessel occlusion. The patient does not appear to manifest atherosclerotic disease. 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 No complications seen relative to that. Streak artifact secondary to the dense coils. Normal perfusion study. Electronically Signed   By: Paulina Fusi M.D.   On: 04/08/2018 20:32   Mr Brain Wo Contrast  Result Date: 04/08/2018 CLINICAL DATA:  Code stroke. Right MCA syndrome. Negative CT imaging EXAM: MRI HEAD WITHOUT CONTRAST TECHNIQUE: Multiplanar, multiecho pulse sequences of the brain and surrounding structures were obtained without intravenous contrast. COMPARISON:  CT studies same. FINDINGS: Brain: Diffusion imaging shows a cm early acute infarction in the right medial thalamus. No other acute insult. Elsewhere, brainstem and cerebellum are. Cerebral hemispheres show mild chronic small-vessel ischemic change in the forceps major regions. There is a small focus of gliosis in the right frontal white matter related to a previous ventriculostomy. No hydrocephalus. No extra-axial collection Vascular: Major vessels at the base of the brain show flow. Skull and upper cervical spine: Negative Sinuses/Orbits: Widespread sinus inflammatory disease particularly in the ethmoid regions. Orbits negative. Other: None IMPRESSION: 1 cm acute infarction in medial right thalamus. Electronically Signed   By: Paulina Fusi M.D.   On: 04/08/2018 20:56   Ct C-spine No Charge  Result Date: 04/08/2018 CLINICAL DATA:  Focal neural deficit with a clinical concern for stroke. EXAM:  CT CERVICAL SPINE WITHOUT CONTRAST TECHNIQUE: Multidetector CT imaging of the cervical spine was performed without intravenous contrast. Multiplanar CT image reconstructions were also generated. COMPARISON:  Head CT obtained earlier today. Neck CTA obtained concurrently with study, including the scout images. FINDINGS: Alignment: Reversal the normal cervical lordosis. No subluxations. Skull base and vertebrae: No acute fracture. No primary bone lesion or focal pathologic process. Soft tissues and spinal canal: No prevertebral fluid or swelling. No visible canal hematoma. Disc levels: Multilevel degenerative changes, most pronounced at the C5-6 level with moderate posterior spur formation. Upper chest: Clear lung apices. Other: Intravascular contrast, described separately. IMPRESSION: 1. No cervical spine fracture or subluxation. 2. Reversal of the normal cervical lordosis. 3. Multilevel degenerative changes. Electronically Signed   By: Beckie Salts M.D.   On: 04/08/2018 20:35   Ct Cerebral Perfusion W Contrast  Result Date: 04/08/2018 CLINICAL DATA:  Code stroke patient.  Right MCA syndrome clinically. EXAM: CT ANGIOGRAPHY HEAD AND NECK CT PERFUSION BRAIN TECHNIQUE: Multidetector CT imaging of the head and neck was performed using the standard protocol during bolus administration of intravenous contrast. Multiplanar CT image reconstructions and MIPs were obtained to evaluate the vascular anatomy. Carotid stenosis measurements (when applicable) are obtained utilizing NASCET criteria, using the distal internal carotid diameter as the denominator. Multiphase CT imaging of the brain was performed following IV bolus contrast injection. Subsequent parametric perfusion maps were calculated using RAPID software. CONTRAST:  OMNIPAQUE IOHEXOL 350 MG/ML SOLN COMPARISON:  Head CT earlier same day FINDINGS: CTA NECK FINDINGS Aortic arch: No atherosclerotic  change of the arch. Branching pattern is normal. Right carotid  system: Common carotid artery widely patent to the bifurcation. No atherosclerotic change at the carotid bifurcation. Cervical ICA widely patent. Left carotid system: Common carotid artery widely patent to the bifurcation. Carotid bifurcation is normal. Cervical ICA widely patent. Vertebral arteries: Patent and normal.  Left vertebral is dominant. Skeleton: Cervical spondylosis most pronounced at C5-6. Other neck: No mass or lymphadenopathy.  Normal Upper chest: Normal Review of the MIP images confirms the above findings CTA HEAD FINDINGS Anterior circulation: Both internal carotid arteries are patent through the skull base and siphon regions. No siphon calcification. The anterior and middle cerebral vessels are patent without proximal stenosis, aneurysm or vascular malformation. No missing branch vessels are identified. Posterior circulation: Both vertebral arteries are patent through the foramen magnum to the basilar. There is a basilar stent in place related 2 coiling of a basilar tip aneurysm. Extensive streak artifact present secondary to that. Posterior cerebral arteries seem to show normal flow distally. Venous sinuses: Patent and normal. Anatomic variants: None significant. Delayed phase: No abnormal enhancement. Review of the MIP images confirms the above findings CT Brain Perfusion Findings: CBF (<30%) Volume: 0mL Perfusion (Tmax>6.0s) volume: 0mL Mismatch Volume: Infarction Location:None IMPRESSION: No large or medium vessel occlusion. The patient does not appear to manifest atherosclerotic disease. 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 No complications seen relative to that. Streak artifact secondary to the dense coils. Normal perfusion study. Electronically Signed   By: Paulina Fusi M.D.   On: 04/08/2018 20:32   Ct Head Code Stroke Wo Contrast  Result Date: 04/08/2018 CLINICAL DATA:  Code stroke. Last seen normal 1830 hours. Left-sided weakness, facial droop and slurred  speech. EXAM: CT HEAD WITHOUT CONTRAST TECHNIQUE: Contiguous axial images were obtained from the base of the skull through the vertex without intravenous contrast. COMPARISON:  12/17/2017 FINDINGS: Brain: Mild generalized atrophy. Small area of low-density in the right frontal lobe related to a previous ventriculostomy. No other abnormal brain parenchymal finding. Vascular: Stent assisted coiling of a posterior circulation aneurysm. No acute vascular finding by CT. Skull: Normal except for the right frontal burr hole. Sinuses/Orbits: Widespread opacification of the ethmoid and sphenoid sinuses. Left division of the frontal sinus is opacified. Mucosal thickening of the paranasal maxillary sinuses. Other: None ASPECTS (Alberta Stroke Program Early CT Score) - Ganglionic level infarction (caudate, lentiform nuclei, internal capsule, insula, M1-M3 cortex): 7 - Supraganglionic infarction (M4-M6 cortex): 3 Total score (0-10 with 10 being normal): 10 IMPRESSION: 1. No acute finding by CT. 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 A low-density in the right frontal white matter related to a previous ventriculostomy. 2. ASPECTS is 10 3. These results were communicated to Dr. Laurence Slate at 8:10 pmon 3/18/2020by text page via the [facility] messaging system. Electronically Signed   By: Paulina Fusi M.D.   On: 04/08/2018 20:12    PHYSICAL EXAM Pleasant middle-age [place] male not in distress. . Afebrile. Head is nontraumatic. Neck is supple without bruit.    Cardiac exam no murmur or gallop. Lungs are clear to auscultation. Distal pulses are well felt.he is wearing a cervical neck collar Neurological Exam ;  Awake  Alert oriented x 3. Normal speech and language.. Diminished recall 2/3.eye movements full without nystagmus.fundi were not visualized. Vision acuity and fields appear normal. Hearing is normal. Palatal movements are normal. Face symmetric. Tongue midline. Normal strength, tone, reflexes  and coordination. Normal sensation. Gait deferred.  NIHSS 0 Premorbid MRS 0   ASSESSMENT/PLAN [name] is a 58 y.o. male with history of SAH, BA aneruysm s/p coil and stent 2014 presenting with left-sided weakness, dizziness. tPA given 04/08/2018 at 2103.  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 Previous stent assisted coiling of post circ aneurysm. Low density R frontal WM d/t EVD. ASPECTS 10.     CTA head & neck no LVO. Previous stent assisted coiling basilar tip aneurysm. Streak artifact secondary to dense coils.  CT perfusion normal  MRI  1 cm R thalamic infarct  Repeat CT head stable. Sequelae Old R frontal EVD  2D Echo w/ bubble  pending   TCD bubble pending   Recommend prolonged cardiac monitoring device to loop for AF as source of stroke. Dr. Johney Frame contacted directly given COVID.  LDL 72  HgbA1c 5.9  SCDs for VTE prophylaxis  No antithrombotic prior to admission, now on No antithrombotic. Add aspirin if 24h imaging negative for hemorrhage. At this point, plan aspirin and plavix x 3 weeks at d/c then aspirin alone  Therapy recommendations:  Pending. Ok to get OOB today once neck is cleared.   Disposition:  pending   Transfer to floor if able. Continue post tPA care  Fall  Neck collar on  Check spine flex ex xray to clear neck  Hyperlipidemia  Home meds:  No statin  LDL 72, goal < 70  Now on lipitor 40  Continue statin at discharge  Other Stroke Risk Factors  Obesity, Body mass index is 31.63 kg/m., recommend weight loss, diet and exercise as appropriate   Hx stroke/TIA  SAH  BA aneurysm coil and stent 2014 Dr. Conchita Paris  Probable obstructive sleep apnea. Not interested in SLEEP SMART at this time. He is open to reviewing information.  Other Active Problems  Thrombocytopenia, PLT  100  Hospital day # 1  Annie Main, MSN, APRN, ANVP-BC, AGPCNP-BC Advanced Practice Stroke Nurse [facility] See Amion for Schedule & Pager information 04/09/2018 8:57 AM  I have personally obtained history,examined this patient, reviewed notes, independently viewed imaging studies, participated in medical decision making and plan of care.ROS completed by me personally and pertinent positives fully documented  I have made any additions or clarifications directly to the above note. Agree with note above. He presented with syncopal episode which was unwitnessed. He denied history of palpitations or A. Fib but MRI scan shows a tiny medial right thalamic infarct likely embolic from top of the basilar syndrome. He received IV tPA and has made excellent clinical recovery. Continue close neurological monitoring and strict blood pressure control as per post TPA protocol. Check echocardiogram and consider loop recorder insertion for paroxysmal A. Fib. Patient may also be at high risk for sleep apnea and may consider possible participation in the sleep smart stroke study if interested. Long discussion with patient and daughter and answered questions. Discussed with Dr. Johney Frame This patient is critically ill and at significant risk of neurological worsening, death and care requires constant monitoring of vital signs, hemodynamics,respiratory and cardiac monitoring, extensive review of multiple databases, frequent neurological assessment, discussion with family, other specialists and medical decision making of high complexity.I have made any additions or clarifications directly to the above note.This critical care time does not reflect procedure time, or teaching time or supervisory time of PA/NP/Med Resident  etc but could involve care discussion time.  I spent 30 minutes of neurocritical care time  in the care of  this patient.     Delia Heady, MD Medical Director [facility] Pager:  [phone] 04/09/2018 2:24 PM  To contact Stroke Continuity provider, please refer to [email or web address]. After hours, contact General Neurology

## 2018-04-09 NOTE — Progress Notes (Signed)
Neuro MD Aroor paged about patient's change in neuro status. At 0030 neuro check, patient presented with a drift in the left arm, whereas previously on assessments there was no drift. At 0045 on another neuro assessment, patient was presenting with ataxia in left arm, and this was new for my assessments as well. Neuro MD Aroor paged again. Neuro MD told me that he believed this is just fluctuation, and to keep monitoring and inform him of any new headache or increases in NIH. At 0100 neuro assessment, patient's left arm was weaker, with some effort against gravity. Patient's speech was slurred, and facial droop had increased as well. Patient was more lethargic and had trouble keeping eyes open, but still easily arousable. Patient was also now experiencing a headache. Neuro MD paged again. I did voice my concern that I thought we should do a head CT. MD refused recommendation and again stated that he believed this was fluctuation and that it will resolve. 0115, 0130, and 0145 neuro assessments remained about the same. Upon 0200 neuro assessment, patient's increased NIH had resolved and he was back to an NIH of 1 like he was upon my initial assessment. No more drift, slurred speech, etc. Neuro MD was made aware of this resolution. Will continue to monitor.

## 2018-04-09 NOTE — Progress Notes (Signed)
SLP Cancellation Note  Patient Details Name: Richard Davenport MRN: [id number] DOB: Aug 25, 1960   Cancelled treatment:       Reason Eval/Treat Not Completed: Other (comment)(just received lunch- eval for SLE) Pt just eating lunch after busy afternoon with PT, OT etc. Will continue efforts   Royce Macadamia 04/09/2018, 3:41 PM   Breck Coons Lonell Face.Ed Sports administrator Pager [phone] Office [phone]  s

## 2018-04-09 NOTE — Progress Notes (Signed)
PT Cancellation Note  Patient Details Name: Jamien Korab MRN: [id number] DOB: December 15, 1960   Cancelled Treatment:    Reason Eval/Treat Not Completed: Active bedrest order   Enedina Finner Sundae Maners 04/09/2018, 7:07 AM Delaney Meigs, PT Acute Rehabilitation Services Pager: [phone] Office: [phone]

## 2018-04-10 ENCOUNTER — Encounter (HOSPITAL_COMMUNITY)
Admission: EM | Disposition: A | Discharge status: Home / Self Care | Payer: Self-pay | Source: Home / Self Care | Attending: Neurology

## 2018-04-10 ENCOUNTER — Inpatient Hospital Stay (HOSPITAL_COMMUNITY): Payer: Medicaid Other

## 2018-04-10 DIAGNOSIS — I6381 Other cerebral infarction due to occlusion or stenosis of small artery: Secondary | ICD-10-CM

## 2018-04-10 DIAGNOSIS — I639 Cerebral infarction, unspecified: Secondary | ICD-10-CM | POA: Diagnosis present

## 2018-04-10 DIAGNOSIS — E669 Obesity, unspecified: Secondary | ICD-10-CM | POA: Diagnosis present

## 2018-04-10 DIAGNOSIS — I6389 Other cerebral infarction: Secondary | ICD-10-CM

## 2018-04-10 DIAGNOSIS — D696 Thrombocytopenia, unspecified: Secondary | ICD-10-CM | POA: Diagnosis present

## 2018-04-10 DIAGNOSIS — E785 Hyperlipidemia, unspecified: Secondary | ICD-10-CM | POA: Diagnosis present

## 2018-04-10 DIAGNOSIS — E1165 Type 2 diabetes mellitus with hyperglycemia: Secondary | ICD-10-CM

## 2018-04-10 DIAGNOSIS — Z8679 Personal history of other diseases of the circulatory system: Secondary | ICD-10-CM

## 2018-04-10 DIAGNOSIS — Z9889 Other specified postprocedural states: Secondary | ICD-10-CM

## 2018-04-10 DIAGNOSIS — E119 Type 2 diabetes mellitus without complications: Secondary | ICD-10-CM

## 2018-04-10 HISTORY — PX: LOOP RECORDER INSERTION: EP1214

## 2018-04-10 HISTORY — DX: Other cerebral infarction due to occlusion or stenosis of small artery: I63.81

## 2018-04-10 LAB — GLUCOSE, CAPILLARY
Glucose-Capillary: 105 mg/dL — ABNORMAL HIGH (ref 70–99)
Glucose-Capillary: 141 mg/dL — ABNORMAL HIGH (ref 70–99)

## 2018-04-10 SURGERY — LOOP RECORDER INSERTION

## 2018-04-10 MED ORDER — ASPIRIN 81 MG PO TBEC: 81.0000 mg | DELAYED_RELEASE_TABLET | Freq: Every day | ORAL | Status: DC

## 2018-04-10 MED ORDER — CLOPIDOGREL BISULFATE 75 MG PO TABS: 75.0000 mg | ORAL_TABLET | Freq: Every day | ORAL | 0 refills | Status: DC

## 2018-04-10 MED ORDER — METFORMIN HCL 500 MG PO TABS: 500.0000 mg | ORAL_TABLET | Freq: Two times a day (BID) | ORAL | Status: DC

## 2018-04-10 MED ORDER — CLOPIDOGREL BISULFATE 75 MG PO TABS: 75.0000 mg | ORAL_TABLET | Freq: Every day | ORAL | Status: DC

## 2018-04-10 MED ORDER — LIDOCAINE-EPINEPHRINE 1 %-1:100000 IJ SOLN
INTRAMUSCULAR | Status: AC
  Filled 2018-04-10: qty 1

## 2018-04-10 MED ORDER — ASPIRIN EC 81 MG PO TBEC: 81.0000 mg | DELAYED_RELEASE_TABLET | Freq: Every day | ORAL | Status: DC

## 2018-04-10 MED ORDER — LIDOCAINE-EPINEPHRINE 1 %-1:100000 IJ SOLN
INTRAMUSCULAR | Status: DC | PRN
  Administered 2018-04-10: 10 mL

## 2018-04-10 SURGICAL SUPPLY — 2 items
LOOP REVEAL LINQSYS (Prosthesis & Implant Heart) ×3 IMPLANT
PACK LOOP INSERTION (CUSTOM PROCEDURE TRAY) ×3 IMPLANT

## 2018-04-10 NOTE — TOC Transition Note (Signed)
Transition of Care [facility]) - CM/SW Discharge Note   Patient Details  Name: Richard Davenport MRN: [id number] Date of Birth: [birth year]  [facility] CM/SW Contact:  Kermit Balo, RN Phone Number: 04/10/2018, 3:43 PM   Clinical Narrative:    CM consulted for outpatient therapy. Daughter asked to attend [facility]. Orders in Epic and information on the AVS.  Daughter providing transport home.   Final next level of care: OP Rehab Barriers to Discharge: No Barriers Identified   Patient Goals and CMS Choice     Choice offered to / list presented to : Adult Children  Discharge Placement                       Discharge Plan and Services   Discharge Planning Services: CM Consult, Medication Assistance                      Social Determinants of Health (SDOH) Interventions     Readmission Risk Interventions No flowsheet data found.

## 2018-04-10 NOTE — Discharge Summary (Addendum)
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 Headache   . Stroke (HCC)   . Subarachnoid hemorrhage [facility])    Past Surgical History:  Procedure Laterality Date  . ANEURYSM COILING     for bleed  . RADIOLOGY WITH ANESTHESIA N/A 10/09/2012   Procedure: RADIOLOGY WITH ANESTHESIA;  Surgeon: Lisbeth Renshaw, MD;  Location: [facility] OR;  Service: Radiology;  Laterality: N/A;  . RADIOLOGY WITH ANESTHESIA N/A 05/27/2013   Procedure: RADIOLOGY WITH ANESTHESIA;  Surgeon: Lisbeth Renshaw, MD;  Location: [facility] OR;  Service: Radiology;  Laterality: N/A;    Allergies as of 04/10/2018      Reactions   Pork-derived Products Other (See Comments)   Richard Davenport is Muslim and PREFERS TO NOT TAKE ANY PORK OR MEAT PRODUCTS (only fish)      Medication List    TAKE these medications   aspirin 81 MG EC tablet Take 1 tablet (81 mg total) by mouth daily.   clopidogrel 75 MG tablet Commonly known as:  PLAVIX Take 1 tablet (75 mg total) by mouth daily. Only take for 21 days. Take with aspirin and Continue aspirin once you stop plavix   metFORMIN 500 MG tablet Commonly known as:  GLUCOPHAGE Take 1 tablet (500 mg total) by mouth 2 (two) times daily.       LABORATORY STUDIES CBC    Component Value Date/Time   WBC 5.6 04/08/2018 2009   RBC  3.71 (L) 04/08/2018 2009   HGB 12.9 (L) 04/08/2018 2009   HCT 38.5 (L) 04/08/2018 2009   PLT 100 (L) 04/08/2018 2009   MCV 103.8 (H) 04/08/2018 2009   MCH 34.8 (H) 04/08/2018 2009   MCHC 33.5 04/08/2018 2009   RDW 12.7 04/08/2018 2009   LYMPHSABS 2.3 04/08/2018 2009   MONOABS 0.5 04/08/2018 2009   EOSABS 0.4 04/08/2018 2009   BASOSABS 0.0 04/08/2018 2009   CMP    Component Value Date/Time   NA 137 04/08/2018 2009   K 3.6 04/08/2018 2009   CL 106 04/08/2018 2009   CO2 24 04/08/2018 2009   GLUCOSE 141 (H) 04/08/2018 2009   BUN 16 04/08/2018 2009   CREATININE 1.00 04/08/2018 2010   CALCIUM 9.0 04/08/2018 2009   PROT 7.3 04/08/2018 2009   ALBUMIN 3.3 (L) 04/08/2018 2009   AST 35 04/08/2018 2009   ALT 26 04/08/2018 2009   ALKPHOS 107 04/08/2018 2009   BILITOT 0.5 04/08/2018 2009   GFRNONAA >60 04/08/2018 2009   GFRAA >60 04/08/2018 2009   COAGS Lab Results  Component Value Date   INR 1.4 (H) 04/08/2018   INR 1.15 05/27/2014   INR 1.42 08/26/2013   Lipid Panel    Component Value Date/Time   CHOL 139 04/09/2018 0526   TRIG 96 04/09/2018  0526   HDL 48 04/09/2018 0526   CHOLHDL 2.9 04/09/2018 0526   VLDL 19 04/09/2018 0526   LDLCALC 72 04/09/2018 0526   HgbA1C  Lab Results  Component Value Date   HGBA1C 5.9 (H) 04/09/2018   Urinalysis    Component Value Date/Time   COLORURINE STRAW (A) 04/08/2018 2020   APPEARANCEUR CLEAR 04/08/2018 2020   LABSPEC 1.021 04/08/2018 2020   PHURINE 7.0 04/08/2018 2020   GLUCOSEU NEGATIVE 04/08/2018 2020   HGBUR NEGATIVE 04/08/2018 2020   BILIRUBINUR NEGATIVE 04/08/2018 2020   KETONESUR NEGATIVE 04/08/2018 2020   PROTEINUR NEGATIVE 04/08/2018 2020   UROBILINOGEN 0.2 05/27/2014 1230   NITRITE NEGATIVE 04/08/2018 2020   LEUKOCYTESUR NEGATIVE 04/08/2018 2020   Urine Drug Screen     Component Value Date/Time   LABOPIA NONE DETECTED 04/08/2018 2000   COCAINSCRNUR NONE DETECTED 04/08/2018 2000   LABBENZ NONE DETECTED  04/08/2018 2000   AMPHETMU NONE DETECTED 04/08/2018 2000   THCU NONE DETECTED 04/08/2018 2000   LABBARB NONE DETECTED 04/08/2018 2000    Alcohol Level    Component Value Date/Time   ETH <10 04/08/2018 2009     SIGNIFICANT DIAGNOSTIC STUDIES Ct Angio Head W Or Wo Contrast  Result Date: 04/08/2018 CLINICAL DATA:  Code stroke Richard Davenport.  Right MCA syndrome clinically. EXAM: CT ANGIOGRAPHY HEAD AND NECK CT PERFUSION BRAIN TECHNIQUE: Multidetector CT imaging of the head and neck was performed using the standard protocol during bolus administration of intravenous contrast. Multiplanar CT image reconstructions and MIPs were obtained to evaluate the vascular anatomy. Carotid stenosis measurements (when applicable) are obtained utilizing NASCET criteria, using the distal internal carotid diameter as the denominator. Multiphase CT imaging of the brain was performed following IV bolus contrast injection. Subsequent parametric perfusion maps were calculated using RAPID software. CONTRAST:  OMNIPAQUE IOHEXOL 350 MG/ML SOLN COMPARISON:  Head CT earlier same day FINDINGS: CTA NECK FINDINGS Aortic arch: No atherosclerotic change of the arch. Branching pattern is normal. Right carotid system: Common carotid artery widely patent to the bifurcation. No atherosclerotic change at the carotid bifurcation. Cervical ICA widely patent. Left carotid system: Common carotid artery widely patent to the bifurcation. Carotid bifurcation is normal. Cervical ICA widely patent. Vertebral arteries: Patent and normal.  Left vertebral is dominant. Skeleton: Cervical spondylosis most pronounced at C5-6. Other neck: No mass or lymphadenopathy.  Normal Upper chest: Normal Review of the MIP images confirms the above findings CTA HEAD FINDINGS Anterior circulation: Both internal carotid arteries are patent through the skull base and siphon regions. No siphon calcification. The anterior and middle cerebral vessels are patent without  proximal stenosis, aneurysm or vascular malformation. No missing branch vessels are identified. Posterior circulation: Both vertebral arteries are patent through the foramen magnum to the basilar. There is a basilar stent in place related 2 coiling of a basilar tip aneurysm. Extensive streak artifact present secondary to that. Posterior cerebral arteries seem to show normal flow distally. Venous sinuses: Patent and normal. Anatomic variants: None significant. Delayed phase: No abnormal enhancement. Review of the MIP images confirms the above findings CT Brain Perfusion Findings: CBF (<30%) Volume: 0mL Perfusion (Tmax>6.0s) volume: 0mL Mismatch Volume: Infarction Location:None IMPRESSION: No large or medium vessel occlusion. The Richard Davenport does not appear to manifest atherosclerotic disease. 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 No complications seen relative to that. Streak artifact secondary to the dense coils. Normal perfusion study. Electronically Signed   By: Paulina Fusi M.D.   On: 04/08/2018 20:32  Ct Head Wo Contrast  Result Date: 04/09/2018 CLINICAL DATA:  Altered mental status EXAM: CT HEAD WITHOUT CONTRAST TECHNIQUE: Contiguous axial images were obtained from the base of the skull through the vertex without intravenous contrast. COMPARISON:  Head CT 04/08/2018 FINDINGS: Brain: There is no mass, hemorrhage or extra-axial collection. The size and configuration of the ventricles and extra-axial CSF spaces are normal. The brain parenchyma is normal, without acute or chronic infarction. Focus of encephalomalacia in the right frontal lobe secondary to prior ventriculostomy catheter. Vascular: Posterior circulation stent and coil mass are unchanged. Skull: Old right frontal burr hole. Sinuses/Orbits: No fluid levels or advanced mucosal thickening of the visualized paranasal sinuses. No mastoid or middle ear effusion. The orbits are normal. IMPRESSION: No acute abnormality. Findings of  prior right frontal ventriculostomy and posterior circulation aneurysm coiling. Electronically Signed   By: Deatra Robinson M.D.   On: 04/09/2018 21:31   Ct Head Wo Contrast  Result Date: 04/08/2018 CLINICAL DATA:  Initial evaluation for status post tPA. EXAM: CT HEAD WITHOUT CONTRAST TECHNIQUE: Contiguous axial images were obtained from the base of the skull through the vertex without intravenous contrast. COMPARISON:  Prior brain MRI from earlier the same day. FINDINGS: Brain: Streak artifact from prior stent assisted posterior circulation aneurysm. Prior small infarct involving the medial right thalamus not well seen given artifact. No intracranial hemorrhage or other complication status post tPA administration. No other acute or pelvic large vessel territory infarct. No mass effect or midline shift. Prior right frontal ventriculostomy tract noted. No hydrocephalus. No extra-axial fluid collection. Vascular: Streak artifact from stent an aneurysm coils. No appreciable hyperdense vessel. Skull: Scalp soft tissues demonstrate no acute finding. Right frontal burr hole noted. Sinuses/Orbits: Globes and orbital soft tissues within normal limits. Right gaze noted. Chronic pansinusitis again noted. Small right mastoid effusion, unchanged. Other: None. IMPRESSION: 1. Stable head CT. No intracranial hemorrhage or other complication status post tPA administration. Previously identified small right thalamic infarct not well visualized given streak artifact from stent assisted posterior circulation aneurysm coiling. 2. Sequelae of prior right frontal ventriculostomy. Electronically Signed   By: Rise Mu M.D.   On: 04/08/2018 22:54   Ct Angio Neck W Or Wo Contrast  Result Date: 04/08/2018 CLINICAL DATA:  Code stroke Richard Davenport.  Right MCA syndrome clinically. EXAM: CT ANGIOGRAPHY HEAD AND NECK CT PERFUSION BRAIN TECHNIQUE: Multidetector CT imaging of the head and neck was performed using the standard protocol  during bolus administration of intravenous contrast. Multiplanar CT image reconstructions and MIPs were obtained to evaluate the vascular anatomy. Carotid stenosis measurements (when applicable) are obtained utilizing NASCET criteria, using the distal internal carotid diameter as the denominator. Multiphase CT imaging of the brain was performed following IV bolus contrast injection. Subsequent parametric perfusion maps were calculated using RAPID software. CONTRAST:  OMNIPAQUE IOHEXOL 350 MG/ML SOLN COMPARISON:  Head CT earlier same day FINDINGS: CTA NECK FINDINGS Aortic arch: No atherosclerotic change of the arch. Branching pattern is normal. Right carotid system: Common carotid artery widely patent to the bifurcation. No atherosclerotic change at the carotid bifurcation. Cervical ICA widely patent. Left carotid system: Common carotid artery widely patent to the bifurcation. Carotid bifurcation is normal. Cervical ICA widely patent. Vertebral arteries: Patent and normal.  Left vertebral is dominant. Skeleton: Cervical spondylosis most pronounced at C5-6. Other neck: No mass or lymphadenopathy.  Normal Upper chest: Normal Review of the MIP images confirms the above findings CTA HEAD FINDINGS Anterior circulation: Both internal carotid arteries are  patent through the skull base and siphon regions. No siphon calcification. The anterior and middle cerebral vessels are patent without proximal stenosis, aneurysm or vascular malformation. No missing branch vessels are identified. Posterior circulation: Both vertebral arteries are patent through the foramen magnum to the basilar. There is a basilar stent in place related 2 coiling of a basilar tip aneurysm. Extensive streak artifact present secondary to that. Posterior cerebral arteries seem to show normal flow distally. Venous sinuses: Patent and normal. Anatomic variants: None significant. Delayed phase: No abnormal enhancement. Review of the MIP images confirms  the above findings CT Brain Perfusion Findings: CBF (<30%) Volume: 0mL Perfusion (Tmax>6.0s) volume: 0mL Mismatch Volume: Infarction Location:None IMPRESSION: No large or medium vessel occlusion. The Richard Davenport does not appear to manifest atherosclerotic disease. 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 No complications seen relative to that. Streak artifact secondary to the dense coils. Normal perfusion study. Electronically Signed   By: Paulina Fusi M.D.   On: 04/08/2018 20:32   Mr Brain Wo Contrast  Result Date: 04/08/2018 CLINICAL DATA:  Code stroke. Right MCA syndrome. Negative CT imaging EXAM: MRI HEAD WITHOUT CONTRAST TECHNIQUE: Multiplanar, multiecho pulse sequences of the brain and surrounding structures were obtained without intravenous contrast. COMPARISON:  CT studies same. FINDINGS: Brain: Diffusion imaging shows a cm early acute infarction in the right medial thalamus. No other acute insult. Elsewhere, brainstem and cerebellum are. Cerebral hemispheres show mild chronic small-vessel ischemic change in the forceps major regions. There is a small focus of gliosis in the right frontal white matter related to a previous ventriculostomy. No hydrocephalus. No extra-axial collection Vascular: Major vessels at the base of the brain show flow. Skull and upper cervical spine: Negative Sinuses/Orbits: Widespread sinus inflammatory disease particularly in the ethmoid regions. Orbits negative. Other: None IMPRESSION: 1 cm acute infarction in medial right thalamus. Electronically Signed   By: Paulina Fusi M.D.   On: 04/08/2018 20:56   Ct C-spine No Charge  Result Date: 04/08/2018 CLINICAL DATA:  Focal neural deficit with a clinical concern for stroke. EXAM: CT CERVICAL SPINE WITHOUT CONTRAST TECHNIQUE: Multidetector CT imaging of the cervical spine was performed without intravenous contrast. Multiplanar CT image reconstructions were also generated. COMPARISON:  Head CT obtained earlier  today. Neck CTA obtained concurrently with study, including the scout images. FINDINGS: Alignment: Reversal the normal cervical lordosis. No subluxations. Skull base and vertebrae: No acute fracture. No primary bone lesion or focal pathologic process. Soft tissues and spinal canal: No prevertebral fluid or swelling. No visible canal hematoma. Disc levels: Multilevel degenerative changes, most pronounced at the C5-6 level with moderate posterior spur formation. Upper chest: Clear lung apices. Other: Intravascular contrast, described separately. IMPRESSION: 1. No cervical spine fracture or subluxation. 2. Reversal of the normal cervical lordosis. 3. Multilevel degenerative changes. Electronically Signed   By: Beckie Salts M.D.   On: 04/08/2018 20:35   Ct Cerebral Perfusion W Contrast  Result Date: 04/08/2018 CLINICAL DATA:  Code stroke Richard Davenport.  Right MCA syndrome clinically. EXAM: CT ANGIOGRAPHY HEAD AND NECK CT PERFUSION BRAIN TECHNIQUE: Multidetector CT imaging of the head and neck was performed using the standard protocol during bolus administration of intravenous contrast. Multiplanar CT image reconstructions and MIPs were obtained to evaluate the vascular anatomy. Carotid stenosis measurements (when applicable) are obtained utilizing NASCET criteria, using the distal internal carotid diameter as the denominator. Multiphase CT imaging of the brain was performed following IV bolus contrast injection. Subsequent parametric perfusion maps were calculated  using RAPID software. CONTRAST:  OMNIPAQUE IOHEXOL 350 MG/ML SOLN COMPARISON:  Head CT earlier same day FINDINGS: CTA NECK FINDINGS Aortic arch: No atherosclerotic change of the arch. Branching pattern is normal. Right carotid system: Common carotid artery widely patent to the bifurcation. No atherosclerotic change at the carotid bifurcation. Cervical ICA widely patent. Left carotid system: Common carotid artery widely patent to the bifurcation. Carotid  bifurcation is normal. Cervical ICA widely patent. Vertebral arteries: Patent and normal.  Left vertebral is dominant. Skeleton: Cervical spondylosis most pronounced at C5-6. Other neck: No mass or lymphadenopathy.  Normal Upper chest: Normal Review of the MIP images confirms the above findings CTA HEAD FINDINGS Anterior circulation: Both internal carotid arteries are patent through the skull base and siphon regions. No siphon calcification. The anterior and middle cerebral vessels are patent without proximal stenosis, aneurysm or vascular malformation. No missing branch vessels are identified. Posterior circulation: Both vertebral arteries are patent through the foramen magnum to the basilar. There is a basilar stent in place related 2 coiling of a basilar tip aneurysm. Extensive streak artifact present secondary to that. Posterior cerebral arteries seem to show normal flow distally. Venous sinuses: Patent and normal. Anatomic variants: None significant. Delayed phase: No abnormal enhancement. Review of the MIP images confirms the above findings CT Brain Perfusion Findings: CBF (<30%) Volume: 0mL Perfusion (Tmax>6.0s) volume: 0mL Mismatch Volume: Infarction Location:None IMPRESSION: No large or medium vessel occlusion. The Richard Davenport does not appear to manifest atherosclerotic disease. 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 No complications seen relative to that. Streak artifact secondary to the dense coils. Normal perfusion study. Electronically Signed   By: Paulina Fusi M.D.   On: 04/08/2018 20:32   Dg Cerv Spine Flex&ext Only  Result Date: 04/09/2018 CLINICAL DATA:  Fall. EXAM: CERVICAL SPINE - FLEXION AND EXTENSION VIEWS ONLY COMPARISON:  CT 04/08/2018. FINDINGS: C7 incompletely visualized. Diffuse multilevel degenerative change. No acute bony abnormality. No evidence of fracture dislocation. No evidence of flexion or extension deformity. Ligamentous ossification noted. What appears to  be a cochlear implant noted. IMPRESSION: C7 incompletely visualized. Diffuse multilevel degenerative change. No evidence of fracture dislocation. No flexion or extension deformity noted. Electronically Signed   By: Maisie Fus  Register   On: 04/09/2018 10:23   Vas [place] Transcranial Doppler W Bubbles  Result Date: 04/10/2018  Transcranial Doppler Indications: Stroke. Performing Technologist: Gertie Fey MHA, RDMS, RVT, RDCS  Examination Guidelines: A complete evaluation includes B-mode imaging, spectral Doppler, color Doppler, and power Doppler as needed of all accessible portions of each vessel. Bilateral testing is considered an integral part of a complete examination. Limited examinations for reoccurring indications may be performed as noted.  Summary:  A vascular evaluation was performed. The right middle cerebral artery was studied. An IV was inserted into the Richard Davenport's left MCA. Verbal informed consent was obtained.  High intensity transient signals (HITS) were heard at rest and with Valsalva maneuver, with partial curtain effect. 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 Positive TCD Bubble study indicative of medoum size right to left shunt  *See table(s) above for measurements and observations.  Diagnosing physician: Delia Heady MD Electronically signed by Delia Heady MD on 04/10/2018 at 11:13:44 AM.    Final    Ct Head Code Stroke Wo Contrast  Result Date: 04/08/2018 CLINICAL DATA:  Code stroke. Last seen normal 1830 hours. Left-sided weakness, facial droop and slurred speech. EXAM: CT HEAD WITHOUT CONTRAST TECHNIQUE: Contiguous axial images were  obtained from the base of the skull through the vertex without intravenous contrast. COMPARISON:  12/17/2017 FINDINGS: Brain: Mild generalized atrophy. Small area of low-density in the right frontal lobe related to a previous ventriculostomy. No other abnormal brain parenchymal finding. Vascular: Stent assisted coiling of a posterior  circulation aneurysm. No acute vascular finding by CT. Skull: Normal except for the right frontal burr hole. Sinuses/Orbits: Widespread opacification of the ethmoid and sphenoid sinuses. Left division of the frontal sinus is opacified. Mucosal thickening of the paranasal maxillary sinuses. Other: None ASPECTS ([place] Stroke Program Early CT Score) - Ganglionic level infarction (caudate, lentiform nuclei, internal capsule, insula, M1-M3 cortex): 7 - Supraganglionic infarction (M4-M6 cortex): 3 Total score (0-10 with 10 being normal): 10 IMPRESSION: 1. No acute finding by CT. 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 A low-density in the right frontal white matter related to a previous ventriculostomy. 2. ASPECTS is 10 3. These results were communicated to Dr. Laurence Slate at 8:10 pmon 3/18/2020by text page via the [facility] messaging system. Electronically Signed   By: Paulina Fusi M.D.   On: 04/08/2018 20:12   Vas [place] Lower Extremity Venous (dvt)  Result Date: 04/10/2018  Lower Venous Study Indications: Stroke.  Performing Technologist: Blanch Media RVS  Examination Guidelines: A complete evaluation includes B-mode imaging, spectral Doppler, color Doppler, and power Doppler as needed of all accessible portions of each vessel. Bilateral testing is considered an integral part of a complete examination. Limited examinations for reoccurring indications may be performed as noted.  Right Venous Findings: +---------+---------------+---------+-----------+----------+-------+          CompressibilityPhasicitySpontaneityPropertiesSummary +---------+---------------+---------+-----------+----------+-------+ CFV      Full           Yes      Yes                          +---------+---------------+---------+-----------+----------+-------+ SFJ      Full                                                 +---------+---------------+---------+-----------+----------+-------+ FV Prox  Full                                                  +---------+---------------+---------+-----------+----------+-------+ FV Mid   Full                                                 +---------+---------------+---------+-----------+----------+-------+ FV DistalFull                                                 +---------+---------------+---------+-----------+----------+-------+ PFV      Full                                                 +---------+---------------+---------+-----------+----------+-------+ POP  Full           Yes      Yes                          +---------+---------------+---------+-----------+----------+-------+ PTV      Full                                                 +---------+---------------+---------+-----------+----------+-------+ PERO     Full                                                 +---------+---------------+---------+-----------+----------+-------+  Left Venous Findings: +---------+---------------+---------+-----------+----------+-------+          CompressibilityPhasicitySpontaneityPropertiesSummary +---------+---------------+---------+-----------+----------+-------+ CFV      Full           Yes      Yes                          +---------+---------------+---------+-----------+----------+-------+ SFJ      Full                                                 +---------+---------------+---------+-----------+----------+-------+ FV Prox  Full                                                 +---------+---------------+---------+-----------+----------+-------+ FV Mid   Full                                                 +---------+---------------+---------+-----------+----------+-------+ FV DistalFull                                                 +---------+---------------+---------+-----------+----------+-------+ PFV      Full                                                  +---------+---------------+---------+-----------+----------+-------+ POP      Full           Yes      Yes                          +---------+---------------+---------+-----------+----------+-------+ PTV      Full                                                 +---------+---------------+---------+-----------+----------+-------+ PERO  Full                                                 +---------+---------------+---------+-----------+----------+-------+    Summary: Right: There is no evidence of deep vein thrombosis in the lower extremity. No cystic structure found in the popliteal fossa. Left: There is no evidence of deep vein thrombosis in the lower extremity. No cystic structure found in the popliteal fossa.  *See table(s) above for measurements and observations.    Preliminary     2D Echocardiogram   1. The left ventricle has normal systolic function, with an ejection fraction of 55-60%. The cavity size was normal. Left ventricular diastolic parameters were normal.  2. The right ventricle has normal systolic function. The cavity was normal. There is no increase in right ventricular wall thickness.  2D Echocardiogram : arm ejection fraction. No cardiac source of embolism. TEE not done due to covid 19 pandemic restrictions    HISTORY OF PRESENT ILLNESS Richard Davenport is an 58 y.o. 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  Last known well was around 5:30 PM on 04/08/2018 and Richard Davenport said he started to feel dizzy and lightheaded.  Around 6:30 PM Richard Davenport fell and EMS was called.  On assessment Richard Davenport was not moving his left arm and appeared to neglect left side.  On arrival, Richard Davenport was awake and answering questions appropriately.  He was plegic in the left upper extremity and was weaker in the left lower extremity.  Had sensory and visual  neglect.  NIH stroke scale was 15.  Stat CT head was performed which showed no hemorrhage.  CT angiogram was performed which showed no large vessel occlusion and CT perfusion was negative as well.  Stat MRI brain was also obtained to confirm whether this was a stroke as he was higher risk for hemorrhage with TPA given prior history of subarachnoid hemorrhage.   He was treated with IV tPA and admitted to the neuro ICU for further evaluation and treatment.    HOSPITAL COURSE Mr. Marquis Down is a 58 y.o. male with history of SAH, BA aneruysm s/p coil and stent 2014 presenting with left-sided weakness, dizziness. tPA given 04/08/2018 at 2103 for top of the basilar syndrome - he passed out alone in the bathroom and came to with neurologic deficitshe presented to the hospital and had left-sided deficits. He was given IV TPA and did well with resolution of his deficits. He was monitored in a in the intensive care unit where blood pressure was tightly controlled. MRI scan showed a small right medial thalamic infarct. CT angiogram showed patent basilar stent aneurysm coiled without any aneurysm regrowth. Echocardiogram was unremarkable. Telemetry monitoring did not reveal cardiac arrhythmias. 2-D echo with bubble study was unremarkable but transplant Doppler bubble study suggested medium size right to left shunt. B venous Dopplers were negative for DVT. He underwent loop recorder insertion to monitor for paroxysmal A. Fib.LDL cholesterol was borderline at 72 mg percent and hemoglobin A1c was 5.9. He was seen by physical occupational speech therapy and felt to have no therapy needs. He was discharged home with instructions for outpatient follow-up in the stroke clinic in 6 weeks and to take aspirin and Plavix for 3 weeks followed by aspirin  alone. Possible participation in the sleep smart stroke study was discussed but Richard Davenport declined.   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 Previous stent assisted coiling of post circ aneurysm. Low density R frontal WM d/t EVD. ASPECTS 10.     CTA head & neck no LVO. Previous stent assisted coiling basilar tip aneurysm. Streak artifact secondary to dense coils.  CT perfusion normal  MRI  1 cm R thalamic infarct  Repeat CT head stable. Sequelae Old R frontal EVD  2D Echo  EF 55-60%. 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   Therapy recommendations:  no PT, OP SLP  Disposition:  return home  Fall  Neck collar on admission  Spine flex ex xray cleared neck  Diabetes  HgbA1c 5.9, at goal < 7.0  Hold metformin until tomorrow  Hyperlipidemia  Home meds:  No statin  LDL 72, goal < 70  Now on lipitor 40  Continue statin at discharge  Other Stroke Risk Factors  Obesity, Body mass index is 31.61 kg/m., recommend weight loss, diet and exercise as appropriate   Hx stroke/TIA ? SAH ? BA aneurysm coil and stent 2014 Dr. Conchita Paris  Probable obstructive sleep apnea. Not interested in SLEEP SMART at this time. He is open to reviewing information.  Other Active Problems  Thrombocytopenia, PLT 100   DISCHARGE EXAM Blood pressure 110/88, pulse 66, temperature 97.9 F (36.6 C), temperature source Oral, resp. rate 18, height 5\' 8"  (1.727 m), weight 94.3 kg, SpO2 99 %. Pleasant middle-age [place] Asian male not in distress. . Afebrile. Head is nontraumatic. Neck is supple without bruit.    Cardiac exam no murmur or gallop. Lungs are clear  to auscultation. Distal pulses are well felt.he is wearing a cervical neck collar Neurological Exam ;  Awake  Alert oriented x 3. Normal speech and language.. Diminished recall 2/3.eye movements full without nystagmus.fundi were not visualized. Vision acuity and fields appear normal. Hearing is normal. Palatal movements are normal. Face symmetric. Tongue midline. Normal strength, tone, reflexes and coordination. Normal sensation. Gait deferred. NIHSS 0 Premorbid MRS 0  Discharge Diet   Carb modified thin liquids  DISCHARGE PLAN  Disposition:  home  aspirin 81 mg daily and clopidogrel 75 mg daily for secondary stroke prevention x 3 weeks then aspirin alone  Ongoing risk factor control by Primary Care Physician at time of discharge  Follow-up PCP in 2 weeks. Get one if you do not have  Follow-up in [facility] in 4 weeks, office to schedule an appointment.   Follow-up in [facility] care for loop follow up per their direction  35 minutes were spent preparing discharge.  Annie Main, MSN, APRN, ANVP-BC, AGPCNP-BC Advanced Practice Stroke Nurse [facility] See Amion for Schedule & Pager information 04/10/2018 3:16 PM   I have personally obtained history,examined this Richard Davenport, reviewed notes, independently viewed imaging studies, participated in medical decision making and  plan of care.ROS completed by me personally and pertinent positives fully documented  I have made any additions or clarifications directly to the above note. Agree with note above.    Delia Heady, MD Medical Director [facility] Pager: [phone] 04/12/2018 5:58 PM

## 2018-04-10 NOTE — Consult Note (Addendum)
ELECTROPHYSIOLOGY CONSULT NOTE  Patient ID: Richard Davenport MRN: [id number], DOB/AGE: 09-03-1960   Admit date: 04/08/2018 Date of Consult: 04/10/2018  Primary Physician: Patient, No Pcp Per Primary Cardiologist:  Reason for Consultation: Cryptogenic stroke -  recommendations regarding Implantable Loop Recorder, requested by Dr. 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  he has undergone workup for stroke including echocardiogram and carotid angio.  The patient has been monitored on telemetry which has demonstrated sinus rhythm with no arrhythmias.  Inpatient stroke work-up included a TCD study that was positive, followed by LE venous dopple, preliinary report is negative for DVT  Echocardiogram this admission demonstrated   IMPRESSIONS  1. The left ventricle has normal systolic function, with an ejection fraction of 55-60%. The cavity size was normal. Left ventricular diastolic parameters were normal.  2. The right ventricle has normal systolic function. The cavity was normal. There is no increase in right ventricular wall thickness. SUMMARY LVEF 55-60%, normal wall thickness, normal wall motion, normal diastolic function, no significant valvular disease, IVC normal  FINDINGS  Left Ventricle: The left ventricle has normal systolic function, with an ejection fraction of 55-60%. The cavity size was normal. There is no increase in left ventricular wall thickness. Left ventricular diastolic parameters were normal. Normal left  ventricular filling pressures Definity contrast agent was given IV to delineate the left ventricular endocardial borders. Right Ventricle: The right ventricle  has normal systolic function. The cavity was normal. There is no increase in right ventricular wall thickness. Left Atrium: left atrial size was normal in size Right Atrium: right atrial size was normal in size. Interatrial Septum: No atrial level shunt detected by color flow Doppler. Pericardium: There is no evidence of pericardial effusion. Mitral Valve: The mitral valve is normal in structure. Mitral valve regurgitation is not visualized by color flow Doppler. Tricuspid Valve: The tricuspid valve is normal in structure. Tricuspid valve regurgitation was not visualized by color flow Doppler. Aortic Valve: The aortic valve is normal in structure. Aortic valve regurgitation was not visualized by color flow Doppler. There is no evidence of aortic valve stenosis. Pulmonic Valve: The pulmonic valve was not assessed. Pulmonic valve regurgitation is not visualized by color flow Doppler. Venous: The inferior vena cava measures 2.05 cm, is normal in size with greater than 50% respiratory variability.    Lab work is reviewed.  Prior to admission, the patient denies chest pain, shortness of breath, dizziness, palpitations, or syncope.  They are recovering from their stroke with plans to home at discharge.  Discussed with Dr PS who is concerned that the syncopal event that accompanied his stroke suggests temp occlusion of the VB system and then with a fragment seen in the thalamus must have broken up, although I dont know where the other pieces were    Past Medical History:  Diagnosis Date   Headache    Stroke (HCC)    Subarachnoid hemorrhage [facility](HCC)      Surgical History:  Past Surgical History:  Procedure Laterality Date   ANEURYSM COILING     for bleed   RADIOLOGY WITH ANESTHESIA N/A 10/09/2012   Procedure: RADIOLOGY WITH ANESTHESIA;  Surgeon: Lisbeth RenshawNeelesh Nundkumar, MD;  Location: [facility] OR;  Service: Radiology;  Laterality: N/A;   RADIOLOGY WITH ANESTHESIA N/A 05/27/2013   Procedure: RADIOLOGY  WITH ANESTHESIA;  Surgeon: Lisbeth Renshaw, MD;  Location: [facility] OR;  Service: Radiology;  Laterality: N/A;     Medications Prior to Admission  Medication Sig Dispense Refill Last Dose   metFORMIN (GLUCOPHAGE) 500 MG tablet Take 1 tablet (500 mg total) by mouth 2 (two) times daily. 60 tablet 1 04/08/2018 at Unknown time    Inpatient Medications:   [START ON 04/11/2018] metFORMIN  500 mg Oral BID WC   pantoprazole (PROTONIX) IV  40 mg Intravenous QHS    Allergies:  Allergies  Allergen Reactions   Pork-Derived Products Other (See Comments)    Patient is Muslim and PREFERS TO NOT TAKE ANY PORK OR MEAT PRODUCTS (only fish)    Social History   Socioeconomic History   Marital status: Married    Spouse name: Not on file   Number of children: Not on file   Years of education: Not on file   Highest education level: Not on file  Occupational History   Not on file  Social Needs   Financial resource strain: Not on file   Food insecurity:    Worry: Not on file    Inability: Not on file   Transportation needs:    Medical: Not on file    Non-medical: Not on file  Tobacco Use   Smoking status: Never Smoker   Smokeless tobacco: Never Used  Substance and Sexual Activity   Alcohol use: No   Drug use: No   Sexual activity: Not on file  Lifestyle   Physical activity:    Days per week: Not on file    Minutes per session: Not on file   Stress: Not on file  Relationships   Social connections:    Talks on phone: Not on file    Gets together: Not on file    Attends religious service: Not on file    Active member of club or organization: Not on file    Attends meetings of clubs or organizations: Not on file    Relationship status: Not on file   Intimate partner violence:    Fear of current or ex partner: Not on file    Emotionally abused: Not on file    Physically abused: Not on file    Forced sexual activity: Not on file  Other Topics Concern   Not on file    Social History Narrative   Not on file     Family History  Problem Relation Age of Onset   Hypertension Mother    Hypertension Father       Review of Systems: All other systems reviewed and are otherwise negative except as noted above.  Physical Exam: Vitals:   04/09/18 2100 04/09/18 2340 04/10/18 0340 04/10/18 0753  BP: 125/90 107/83 115/83 110/88  Pulse: 80 88 79 66  Resp: Temp: 98.6 F (37 C) 98.9 F (37.2 C) 98.8 F (37.1 C) 97.9 F (36.6 C)  TempSrc: Oral Axillary Oral Oral  SpO2: 98% 96%  99%  Weight:      Height:        GEN- The patient is well appearing, alert and oriented x 3 today.   Head- normocephalic, atraumatic Eyes-  Sclera clear, conjunctiva pink Ears- hearing intact Oropharynx- clear Neck- supple Lungs- CTA b/l,  normal work of breathing Heart- RRR, no murmurs, rubs or gallops  GI- soft, NT, ND Extremities-  no clubbing, cyanosis, or edema MS- no significant deformity or atrophy Skin- no rash or lesion Psych- euthymic mood, full affect   Labs:   Lab Results  Component Value Date   WBC 5.6 04/08/2018   HGB 12.9 (L) 04/08/2018   HCT 38.5 (L) 04/08/2018   MCV 103.8 (H) 04/08/2018   PLT 100 (L) 04/08/2018    Recent Labs  Lab 04/08/18 2009 04/08/18 2010  NA 137  --   K 3.6  --   CL 106  --   CO2 24  --   BUN 16  --   CREATININE 1.07 1.00  CALCIUM 9.0  --   PROT 7.3  --   BILITOT 0.5  --   ALKPHOS 107  --   ALT 26  --   AST 35  --   GLUCOSE 141*  --    Lab Results  Component Value Date   TROPONINI <0.30 10/08/2012   Lab Results  Component Value Date   CHOL 139 04/09/2018   CHOL 153 08/27/2013   Lab Results  Component Value Date   HDL 48 04/09/2018   HDL 44 08/27/2013   Lab Results  Component Value Date   LDLCALC 72 04/09/2018   LDLCALC 37 08/27/2013   Lab Results  Component Value Date   TRIG 96 04/09/2018   TRIG 361 (H) 08/27/2013   Lab Results  Component Value Date   CHOLHDL 2.9 04/09/2018    CHOLHDL 3.5 08/27/2013   No results found for: LDLDIRECT  No results found for: DDIMER   Radiology/Studies:   Ct Angio Head W Or Wo Contrast Result Date: 04/08/2018 CLINICAL DATA:  Code stroke patient.  Right MCA syndrome clinically. EXAM: CT ANGIOGRAPHY HEAD AND NECK CT PERFUSION BRAIN TECHNIQUE: Multidetector CT imaging of the head and neck was performed using the standard protocol during bolus administration of intravenous contrast. Multiplanar CT image reconstructions and MIPs were obtained to evaluate the vascular anatomy. Carotid stenosis measurements (when applicable) are obtained utilizing NASCET criteria, using the distal internal carotid diameter as the denominator. Multiphase CT imaging of the brain was performed following IV bolus contrast injection. Subsequent parametric perfusion maps were calculated using RAPID software. CONTRAST:  OMNIPAQUE IOHEXOL 350 MG/ML SOLN COMPARISON:  Head CT earlier same day FINDINGS: CTA NECK FINDINGS Aortic arch: No atherosclerotic change of the arch. Branching pattern is normal. Right carotid system: Common carotid artery widely patent to the bifurcation. No atherosclerotic change at the carotid bifurcation. Cervical ICA widely patent. Left carotid system: Common carotid artery widely patent to the bifurcation. Carotid bifurcation is normal. Cervical ICA widely patent. Vertebral arteries: Patent and normal.  Left vertebral is dominant. Skeleton: Cervical spondylosis most pronounced at C5-6. Other neck: No mass or lymphadenopathy.  Normal Upper chest: Normal Review of the MIP images confirms the above findings CTA HEAD FINDINGS Anterior circulation: Both internal carotid arteries are patent through the skull base and siphon regions. No siphon calcification. The anterior and middle cerebral vessels are patent without proximal stenosis, aneurysm or vascular malformation. No missing branch vessels are identified. Posterior circulation: Both vertebral arteries  are patent through the foramen magnum to the basilar. There is a basilar stent in place related 2 coiling of a basilar tip aneurysm. Extensive streak artifact present secondary to that. Posterior cerebral arteries seem to show normal flow distally. Venous sinuses: Patent and normal. Anatomic variants: None significant. Delayed phase: No abnormal enhancement. Review of the MIP images confirms the above findings CT Brain Perfusion Findings:  CBF (<30%) Volume: 57mL Perfusion (Tmax>6.0s) volume: 20mL Mismatch Volume: Infarction Location:None IMPRESSION: No large or medium vessel occlusion. The patient does not appear to manifest atherosclerotic disease. 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 No complications seen relative to that. Streak artifact secondary to the dense coils. Normal perfusion study. Electronically Signed   By: Paulina Fusi M.D.   On: 04/08/2018 20:32    Ct Head Wo Contrast Result Date: 04/09/2018 CLINICAL DATA:  Altered mental status EXAM: CT HEAD WITHOUT CONTRAST TECHNIQUE: Contiguous axial images were obtained from the base of the skull through the vertex without intravenous contrast. COMPARISON:  Head CT 04/08/2018 FINDINGS: Brain: There is no mass, hemorrhage or extra-axial collection. The size and configuration of the ventricles and extra-axial CSF spaces are normal. The brain parenchyma is normal, without acute or chronic infarction. Focus of encephalomalacia in the right frontal lobe secondary to prior ventriculostomy catheter. Vascular: Posterior circulation stent and coil mass are unchanged. Skull: Old right frontal burr hole. Sinuses/Orbits: No fluid levels or advanced mucosal thickening of the visualized paranasal sinuses. No mastoid or middle ear effusion. The orbits are normal. IMPRESSION: No acute abnormality. Findings of prior right frontal ventriculostomy and posterior circulation aneurysm coiling. Electronically Signed   By: Deatra Robinson M.D.   On: 04/09/2018  21:31    Mr Brain Wo Contrast Result Date: 04/08/2018 CLINICAL DATA:  Code stroke. Right MCA syndrome. Negative CT imaging EXAM: MRI HEAD WITHOUT CONTRAST TECHNIQUE: Multiplanar, multiecho pulse sequences of the brain and surrounding structures were obtained without intravenous contrast. COMPARISON:  CT studies same. FINDINGS: Brain: Diffusion imaging shows a cm early acute infarction in the right medial thalamus. No other acute insult. Elsewhere, brainstem and cerebellum are. Cerebral hemispheres show mild chronic small-vessel ischemic change in the forceps major regions. There is a small focus of gliosis in the right frontal white matter related to a previous ventriculostomy. No hydrocephalus. No extra-axial collection Vascular: Major vessels at the base of the brain show flow. Skull and upper cervical spine: Negative Sinuses/Orbits: Widespread sinus inflammatory disease particularly in the ethmoid regions. Orbits negative. Other: None IMPRESSION: 1 cm acute infarction in medial right thalamus. Electronically Signed   By: Paulina Fusi M.D.   On: 04/08/2018 20:56    Ct C-spine No Charge Result Date: 04/08/2018 CLINICAL DATA:  Focal neural deficit with a clinical concern for stroke. EXAM: CT CERVICAL SPINE WITHOUT CONTRAST TECHNIQUE: Multidetector CT imaging of the cervical spine was performed without intravenous contrast. Multiplanar CT image reconstructions were also generated. COMPARISON:  Head CT obtained earlier today. Neck CTA obtained concurrently with study, including the scout images. FINDINGS: Alignment: Reversal the normal cervical lordosis. No subluxations. Skull base and vertebrae: No acute fracture. No primary bone lesion or focal pathologic process. Soft tissues and spinal canal: No prevertebral fluid or swelling. No visible canal hematoma. Disc levels: Multilevel degenerative changes, most pronounced at the C5-6 level with moderate posterior spur formation. Upper chest: Clear lung apices.  Other: Intravascular contrast, described separately. IMPRESSION: 1. No cervical spine fracture or subluxation. 2. Reversal of the normal cervical lordosis. 3. Multilevel degenerative changes. Electronically Signed   By: Beckie Salts M.D.   On: 04/08/2018 20:35     Vas [place] Transcranial Doppler W Bubbles Result Date: 04/09/2018  Transcranial Doppler Indications: Stroke. Performing Technologist: Gertie Fey MHA, RDMS, RVT, RDCS  Examination Guidelines: A complete evaluation includes B-mode imaging, spectral Doppler, color Doppler, and power Doppler as needed of all accessible portions of each  vessel. Bilateral testing is considered an integral part of a complete examination. Limited examinations for reoccurring indications may be performed as noted.  Summary:  A vascular evaluation was performed. The right middle cerebral artery was studied. An IV was inserted into the patient's left MCA. Verbal informed consent was obtained.  High intensity transient signals (HITS) were heard at rest and with Valsalva maneuver, with partial curtain effect. 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  *See table(s) above for measurements and observations.    Preliminary    12-lead ECG SR All prior EKG's in EPIC reviewed with no documented atrial fibrillation  Telemetry SR  Assessment and Plan:  1. Cryptogenic stroke The patient presents with cryptogenic stroke.  The patient has a TEE planned for this AM.  I spoke at length with the patient about monitoring for afib with either a 30 day event monitor or an implantable loop recorder.  Risks, benefits, and alteratives to implantable loop recorder were discussed with the patient today.   At this time, the patient is very clear in their decision to proceed with implantable loop recorder.   Wound care was reviewed with the patient (keep incision clean and dry for 3 days).  Wound check will be scheduled for the patient     Sheilah Pigeon,  PA-C 04/10/2018  PFO attributable risk with ROPE score 5 ( or 6) is less than 50%

## 2018-04-10 NOTE — TOC Initial Note (Signed)
Transition of Care [facility]) - Initial/Assessment Note    Patient Details  Name: Richard Davenport MRN: [id number] Date of Birth: [birth year]  Transition of Care [facility]) CM/SW Contact:    Kermit Balo, RN Phone Number: 04/10/2018, 11:56 AM  Clinical Narrative:                   Expected Discharge Plan: Home/Self Care Barriers to Discharge: Continued Medical Work up, Inadequate or no insurance   Patient Goals and CMS Choice        Expected Discharge Plan and Services Expected Discharge Plan: Home/Self Care   Discharge Planning Services: CM Consult, Medication Assistance   Living arrangements for the past 2 months: Single Family Home(one level)                          Prior Living Arrangements/Services Living arrangements for the past 2 months: Single Family Home(one level) Lives with:: Spouse Patient language and need for interpreter reviewed:: Yes(daughter interpreting) Do you feel safe going back to the place where you live?: Yes      Need for Family Participation in Patient Care: No (Comment)(none recommended) Care giver support system in place?: Yes (comment)(spouse and daughter)   Criminal Activity/Legal Involvement Pertinent to Current Situation/Hospitalization: No - Comment as needed  Activities of Daily Living Home Assistive Devices/Equipment: None ADL Screening (condition at time of admission) Patient's cognitive ability adequate to safely complete daily activities?: Yes Is the patient deaf or have difficulty hearing?: No Does the patient have difficulty seeing, even when wearing glasses/contacts?: No Does the patient have difficulty concentrating, remembering, or making decisions?: No Patient able to express need for assistance with ADLs?: Yes Does the patient have difficulty dressing or bathing?: No Independently performs ADLs?: No Communication: Independent Dressing (OT): Needs assistance Is this a change from baseline?: Change from baseline, expected to last  <3days Grooming: Needs assistance Is this a change from baseline?: Change from baseline, expected to last <3 days Feeding: Independent Bathing: Needs assistance Is this a change from baseline?: Change from baseline, expected to last <3 days Toileting: Needs assistance Is this a change from baseline?: Change from baseline, expected to last <3 days In/Out Bed: Needs assistance Is this a change from baseline?: Change from baseline, expected to last <3 days Walks in Home: Needs assistance Is this a change from baseline?: Change from baseline, expected to last <3 days Does the patient have difficulty walking or climbing stairs?: No Weakness of Legs: None Weakness of Arms/Hands: Left  Permission Sought/Granted                  Emotional Assessment Appearance:: Appears older than stated age Attitude/Demeanor/Rapport: Gracious Affect (typically observed): Accepting, Appropriate Orientation: : Oriented to Self, Oriented to Place, Oriented to  Time, Oriented to Situation   Psych Involvement: No (comment)  Admission diagnosis:  Fx [T14.8XXA] Acute ischemic stroke [facility]) [I63.9] Patient Active Problem List   Diagnosis Date Noted  . Ischemic stroke (HCC) 04/08/2018  . AKI (acute kidney injury) (HCC) 08/27/2013  . Dizziness and giddiness 08/27/2013  . TIA (transient ischemic attack) 08/26/2013  . 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 Acute respiratory failure with hypoxia (HCC) 10/10/2012  . SAH (subarachnoid hemorrhage) (HCC) 10/09/2012  . Altered mental status 10/09/2012   PCP: Dr Maple Hudson with [facility]    Pharmacy:   [facility] [address] [place], [place] - [address] [place] [address] Phone:  [phone] Fax: [phone]     Social Determinants of Health (SDOH) Interventions  No insurance but pt has only been on Metformin off the $4 list at [facility]. They use [facility] and feel they can afford d/c meds.  Readmission Risk  Interventions No flowsheet data found.

## 2018-04-10 NOTE — Evaluation (Addendum)
Speech Language Pathology Evaluation Patient Details Name: Richard Davenport MRN: [id number] DOB: 1960/12/12 Today's Date: 04/10/2018 Time: 7564-3329 SLP Time Calculation (min) (ACUTE ONLY): 22 min  Problem List:  Patient Active Problem List   Diagnosis Date Noted  . Ischemic stroke (HCC) 04/08/2018  . AKI (acute kidney injury) (HCC) 08/27/2013  . Dizziness and giddiness 08/27/2013  . TIA (transient ischemic attack) 08/26/2013  . 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 Acute respiratory failure with hypoxia (HCC) 10/10/2012  . SAH (subarachnoid hemorrhage) (HCC) 10/09/2012  . Altered mental status 10/09/2012   Past Medical History:  Past Medical History:  Diagnosis Date  . Headache   . Stroke (HCC)   . Subarachnoid hemorrhage [facility])    Past Surgical History:  Past Surgical History:  Procedure Laterality Date  . ANEURYSM COILING     for bleed  . RADIOLOGY WITH ANESTHESIA N/A 10/09/2012   Procedure: RADIOLOGY WITH ANESTHESIA;  Surgeon: Lisbeth Renshaw, MD;  Location: [facility] OR;  Service: Radiology;  Laterality: N/A;  . RADIOLOGY WITH ANESTHESIA N/A 05/27/2013   Procedure: RADIOLOGY WITH ANESTHESIA;  Surgeon: Lisbeth Renshaw, MD;  Location: [facility] OR;  Service: Radiology;  Laterality: N/A;   HPI:  Pt is a 58 yo admitted after syncope in bathroom with left weakness and dizziness with Rt thalamic infarct s/p tPA. PMhx: [facility] 2014   Assessment / Plan / Recommendation Clinical Impression  Pt participated in speech/language/cognition evaluation with his daughter present. The pt denied any baseline deficits in these areas. However, the pt's daughter indicated that he has had memory difficulty since a previous CVA. The pt stated he has now been having increasing difficulty focusing but did not cite any other new deficits. [facility] Cognitive Assessment 8.1 was completed to evaluate the pt's cognitive-linguistic skills. He achieved a score of 18/30 which is below the normal limits of 26 or more  out of 30 and is suggestive of a mild-moderate impairment. He demonstrated deficits in the areas of attention, abstract reasoning, divergent naming, and delayed recall. [name] daughter expressed that his performance did not appear very far from his baseline but she could not say definitively that he was at his baseline with regards to cognition. Skilled SLP services are clinically indicated at this time to improve cognition. Pt, his daughter, and nursing were educated regarding results and all parties verbalized understanding as well as agreement with plan of care.    SLP Assessment  SLP Recommendation/Assessment: Patient needs continued Speech Lanaguage Pathology Services SLP Visit Diagnosis: Cognitive communication deficit (R41.841)    Follow Up Recommendations  Outpatient SLP    Frequency and Duration min 2x/week  2 weeks      SLP Evaluation Cognition  Overall Cognitive Status: Impaired/Different from baseline Arousal/Alertness: Awake/alert Orientation Level: Oriented to person;Oriented to situation;Oriented to place;Disoriented to time(Disoriented to date but not month/year/day) Attention: Focused;Sustained Focused Attention: Appears intact(Vigilance WNL: 1/1) Sustained Attention: Appears intact(Serial 7s: 3/3) Memory: Impaired(Immediate: 5/5; Delayed: 2/5; With cues: 3/3) Memory Impairment: Retrieval deficit;Decreased recall of new information Awareness: Impaired Awareness Impairment: Intellectual impairment Problem Solving: Appears intact(4/) Executive Function: Reasoning;Sequencing Reasoning: Impaired Reasoning Impairment: Verbal complex(Abstraction: 0/2) Sequencing: Appears intact(Clock drawing: 3/3)       Comprehension  Auditory Comprehension Overall Auditory Comprehension: Appears within functional limits for tasks assessed Yes/No Questions: Within Functional Limits Commands: Impaired Complex Commands: (Trail completion: 0/1) Conversation: Complex Reading  Comprehension Reading Status: Not tested    Expression Expression Primary Mode of Expression: Verbal Verbal Expression Overall Verbal Expression: Appears within functional limits for  tasks assessed Level of Generative/Spontaneous Verbalization: Sentence Repetition: Impaired Level of Impairment: Sentence level(0/2) Naming: Impairment Responsive: Not tested Confrontation: (2/3) Convergent: Not tested Divergent: (Verbal fluency: 0/1) Pragmatics: No impairment Written Expression Dominant Hand: Right Written Expression: (Difficulty copying cube: 0/1)   Oral / Motor  Motor Speech Overall Motor Speech: Appears within functional limits for tasks assessed Respiration: Within functional limits Phonation: Normal Resonance: Within functional limits Articulation: Within functional limitis Intelligibility: Intelligible Motor Planning: Witnin functional limits Motor Speech Errors: Not applicable   Shawndale Kilpatrick I. Vear Clock, MS, CCC-SLP Acute Rehabilitation Services Office number [phone] Pager [phone]                   Scheryl Marten 04/10/2018, 12:56 PM

## 2018-04-10 NOTE — Discharge Instructions (Signed)
Post implant site/wound care instructions °Keep incision clean and dry for 3 days. °You can remove outer dressing tomorrow. °Leave steri-strips (little pieces of tape) on until seen in the office for wound check appointment. °Call the office ([phone]) for redness, drainage, swelling, or fever. ° °

## 2018-04-10 NOTE — Progress Notes (Signed)
PT Cancellation Note  Patient Details Name: Richard Davenport MRN: [id number] DOB: 26-Feb-1960   Cancelled Treatment:    Reason Eval/Treat Not Completed: (P) Patient at procedure or test/unavailable(Pt off unit will defer PT tx at this time.  )   Florestine Avers 04/10/2018, 1:56 PM Joycelyn Rua, PTA Acute Rehabilitation Services Pager [phone] Office [phone]

## 2018-04-10 NOTE — Progress Notes (Signed)
Lower extremity venous duplex has been completed.   Preliminary results in CV Proc.   Blanch Media 04/10/2018 10:11 AM

## 2018-04-10 NOTE — Evaluation (Signed)
Occupational Therapy Evaluation Patient Details Name: Richard Davenport MRN: [id number] DOB: 25-Oct-1960 Today's Date: 04/10/2018    History of Present Illness 58 yo admitted after syncope in bathroom with left weakness and dizziness with Rt thalamic infarct s/p tPA. PMhx: [facility] 2014   Clinical Impression   This 58 y/o male presents with the above. At baseline pt is independent with ADL, iADL and functional mobility. Pt performing functional mobility without AD and overall minguard assist this session; demonstrating standing grooming and LB ADL with minguard throughout. Pt slightly impulsive with mobility and noted mild L inattention with mobility into hallway during attempts to locate specific rooms and when locating pt's room to return - pt able to self-correct and complete task given increased time and min questioning cues. He will benefit from continued acute OT services and pending progress will benefit from continued outpt OT services after discharge to maximize his safety and independence with ADL, iADL and mobility. Will follow.     Follow Up Recommendations  Outpatient OT;Supervision/Assistance - 24 hour(24hr initially; outpt neuro)    Equipment Recommendations  None recommended by OT           Precautions / Restrictions Precautions Precautions: None Restrictions Weight Bearing Restrictions: No      Mobility Bed Mobility Overal bed mobility: Modified Independent                Transfers Overall transfer level: Modified independent                    Balance Overall balance assessment: Needs assistance Sitting-balance support: Feet unsupported;No upper extremity supported Sitting balance-Leahy Scale: Good     Standing balance support: No upper extremity supported;During functional activity Standing balance-Leahy Scale: Fair                             ADL either performed or assessed with clinical judgement   ADL Overall ADL's : Needs  assistance/impaired Eating/Feeding: Independent;Sitting   Grooming: Supervision/safety;Standing;Oral care;Wash/dry face   Upper Body Bathing: Supervision/ safety;Sitting   Lower Body Bathing: Min guard;Sit to/from stand   Upper Body Dressing : Set up;Sitting   Lower Body Dressing: Min guard;Sit to/from stand   Toilet Transfer: Min guard;Ambulation   Toileting- Clothing Manipulation and Hygiene: Min guard;Sit to/from stand       Functional mobility during ADLs: Min guard General ADL Comments: pt slightly impuslive with mobility, decreased attention to items on L side and increased difficulty finding items on L when instructed to do so     Vision         Perception     Praxis      Pertinent Vitals/Pain Pain Assessment: No/denies pain     Hand Dominance Right   Extremity/Trunk Assessment Upper Extremity Assessment Upper Extremity Assessment: Overall WFL for tasks assessed   Lower Extremity Assessment Lower Extremity Assessment: Defer to PT evaluation   Cervical / Trunk Assessment Cervical / Trunk Assessment: Normal   Communication Communication Communication: No difficulties   Cognition Arousal/Alertness: Awake/alert Behavior During Therapy: Impulsive Overall Cognitive Status: Impaired/Different from baseline Area of Impairment: Problem solving;Safety/judgement                         Safety/Judgement: Decreased awareness of safety;Decreased awareness of deficits   Problem Solving: Slow processing;Requires verbal cues;Requires tactile cues General Comments: pt impulsively standing from EOB prior to therapist instruction to do so; when mobilizing  in hallway had pt locate x2 rooms on L side as well as his own room, pt required increased time to do so as he often would pass the room he was looking for, able to self correct given extra time; spoke with dtr and she reports feeling pt is at his baseline (reports pt has mild congitive impairments at  baseline)   General Comments  daughter present during room; pt denies dizziness with activity this session, VSS    Exercises     Shoulder Instructions      Home Living Family/patient expects to be discharged to:: Private residence Living Arrangements: Spouse/significant other;Children Available Help at Discharge: Family;Available 24 hours/day Type of Home: House Home Access: Stairs to enter [employer] of Steps: 1 Entrance Stairs-Rails: Left Home Layout: One level     Bathroom Shower/Tub: Chief Strategy Officer: Standard     Home Equipment: None          Prior Functioning/Environment Level of Independence: Independent        Comments: 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[employer], typically works as a Orthoptist Problem List: Decreased activity tolerance;Impaired balance (sitting and/or standing);Impaired vision/perception;Decreased safety awareness;Decreased cognition      OT Treatment/Interventions: Self-care/ADL training;Therapeutic exercise;Neuromuscular education;Therapeutic activities;Patient/family education;Visual/perceptual remediation/compensation;Cognitive remediation/compensation;Energy conservation    OT Goals(Current goals can be found in the care plan section) Acute Rehab OT Goals Patient Stated Goal: return home OT Goal Formulation: With patient Time For Goal Achievement: 04/24/18 Potential to Achieve Goals: Good  OT Frequency: Min 2X/week   Barriers to D/C:            Co-evaluation              AM-PAC OT "6 Clicks" Daily Activity     Outcome Measure Help from another person eating meals?: None Help from another person taking care of personal grooming?: None Help from another person toileting, which includes using toliet, bedpan, or urinal?: None Help from another person bathing (including washing, rinsing, drying)?: None Help from another person to put on and taking off regular upper body clothing?:  None Help from another person to put on and taking off regular lower body clothing?: A Little 6 Click Score: 23   End of Session Equipment Utilized During Treatment: Gait belt Nurse Communication: Mobility status  Activity Tolerance: Patient tolerated treatment well Patient left: in bed;with call bell/phone within reach;with bed alarm set;with family/visitor present(MD present)  OT Visit Diagnosis: Other symptoms and signs involving the nervous system (R29.898);Other symptoms and signs involving cognitive function                Time: 2979-8921 OT Time Calculation (min): 24 min Charges:  OT General Charges $OT Visit: 1 Visit OT Evaluation $OT Eval Moderate Complexity: 1 Mod OT Treatments $Self Care/Home Management : 8-22 mins  Marcy Siren, OT Supplemental Rehabilitation Services Pager [phone] Office [phone]   Orlando Penner 04/10/2018, 10:56 AM

## 2018-04-13 ENCOUNTER — Encounter (HOSPITAL_COMMUNITY): Payer: Self-pay | Admitting: Internal Medicine

## 2018-04-23 ENCOUNTER — Telehealth: Payer: Self-pay

## 2018-04-23 NOTE — Telephone Encounter (Signed)
I called and left patient a message about moving appointment Friday to Tuesday or Thursday for virtual visit.

## 2018-04-24 NOTE — Telephone Encounter (Signed)
I tried to call patient, no answer and no voicemail. Will send message through mychart about changing to a virtual visit on Tuesday or Thursday.

## 2018-04-27 NOTE — Telephone Encounter (Signed)
Finally spoke with patients daughter, she states patient can do Facetime visit tomorrow at 11:30, he has an iphone. She states that this will be easier for patient and she will tell him about the appointment tomorrow. I will make a note to Facetime in the appointment notes.

## 2018-04-28 ENCOUNTER — Telehealth (INDEPENDENT_AMBULATORY_CARE_PROVIDER_SITE_OTHER): Payer: Self-pay | Admitting: *Deleted

## 2018-04-28 ENCOUNTER — Other Ambulatory Visit: Payer: Self-pay

## 2018-04-28 ENCOUNTER — Telehealth: Payer: Self-pay | Admitting: Cardiology

## 2018-04-28 DIAGNOSIS — I6381 Other cerebral infarction due to occlusion or stenosis of small artery: Secondary | ICD-10-CM

## 2018-04-28 DIAGNOSIS — I639 Cerebral infarction, unspecified: Secondary | ICD-10-CM

## 2018-04-28 LAB — CUP PACEART REMOTE DEVICE CHECK
Date Time Interrogation Session: 20200407122327
Implantable Pulse Generator Implant Date: 20200320

## 2018-04-28 NOTE — Progress Notes (Signed)
ILR wound check via video visit. Steri-strips removed prior to visit by patient. Wound without redness or edema. Incision edges approximated, wound well healed. Normal device function. Battery status: good. No symptom, tachy, or AF episodes. Pause and brady detection off at implant. Patient educated about wound care and Carelink monitor. Monthly summary reports and ROV with WC PRN.

## 2018-04-28 NOTE — Telephone Encounter (Signed)
Spoke w/ pt and informed him that Device Tech RN will call him as soon as possible to do his wound check appt. She is running a little behind at the time.

## 2018-05-01 ENCOUNTER — Ambulatory Visit: Payer: Self-pay

## 2018-05-13 ENCOUNTER — Ambulatory Visit (INDEPENDENT_AMBULATORY_CARE_PROVIDER_SITE_OTHER): Payer: Self-pay | Admitting: *Deleted

## 2018-05-13 ENCOUNTER — Other Ambulatory Visit: Payer: Self-pay

## 2018-05-13 DIAGNOSIS — G459 Transient cerebral ischemic attack, unspecified: Secondary | ICD-10-CM

## 2018-05-14 LAB — CUP PACEART REMOTE DEVICE CHECK
Date Time Interrogation Session: 20200422164043
Implantable Pulse Generator Implant Date: 20200320

## 2018-05-21 NOTE — Progress Notes (Signed)
Carelink Summary Report / Loop Recorder 

## 2018-05-26 ENCOUNTER — Telehealth: Payer: Self-pay

## 2018-05-26 NOTE — Telephone Encounter (Signed)
If pt calls back offer him doxy video visit with Dr Pearlean Brownie on 05/28/2018 Thursday because his schedule is open.We need verbal consent to do video and to file insurance.  Cancel the 06/05/2018 because office is closed.  Left vm for patient that his appt will need to be reschedule due to office being closed on 5/15//2020. I stated on vm it will have to be video due to covid 19.

## 2018-06-03 NOTE — Telephone Encounter (Signed)
I called pt that appt will need to be r/s due to office closing.I stated all visits are video due to COVID 19.He gave verbal consent to do video and to file insurance. I explain it will not be a in office visit but billed the same. I reviewed the pts chart and updated his pharmacy and medications. I explain a text message will be sent to him to join link 10 minutes prior to visit. I explain to follow the instructions and click video. Pt verbalized understanding.

## 2018-06-05 ENCOUNTER — Inpatient Hospital Stay: Payer: MEDICAID | Admitting: Adult Health

## 2018-06-10 ENCOUNTER — Encounter: Payer: Self-pay | Admitting: Neurology

## 2018-06-10 ENCOUNTER — Ambulatory Visit (INDEPENDENT_AMBULATORY_CARE_PROVIDER_SITE_OTHER): Payer: Self-pay | Admitting: Neurology

## 2018-06-10 ENCOUNTER — Other Ambulatory Visit: Payer: Self-pay

## 2018-06-10 DIAGNOSIS — Q211 Atrial septal defect: Secondary | ICD-10-CM

## 2018-06-10 DIAGNOSIS — I639 Cerebral infarction, unspecified: Secondary | ICD-10-CM

## 2018-06-10 DIAGNOSIS — Q2112 Patent foramen ovale: Secondary | ICD-10-CM

## 2018-06-10 NOTE — Progress Notes (Signed)
Virtual Visit via Video Note  I connected with Richard Davenport on 06/10/18 at 10:00 AM EDT by a video enabled telemedicine application and verified that I am speaking with the correct person using two identifiers.  This Meeting Was Performed using [email or web address] app  for visual and audio  Location: Patient: At his home  provider: At [facility]  I discussed the limitations of evaluation and management by telemedicine and the availability of in person appointments. The patient expressed understanding and agreed to proceed.  History of Present Illness: [name] is a 58 year old Asian male seen today for virtual video office follow-up visit following recent admission for stroke.Richard Davenport is an 58 y.o. 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  He felt had trouble moving his left arm and neglecting his left side.  NIH stroke scale was 15 upon arrival with baseline MRIs of 1.  CT scan of the head was unremarkable and CT angiogram no large vessel stenosis or occlusion.  CT perfusion was not witnessed.  Patient was given IV TPA due to his depression he had previous history of subarachnoid hemorrhage but had basilar tip aneurysm which was secured  with coiling.  He was admitted to the intensive care unit for blood pressure was tightly controlled.  He was given IV TPA and did well with resolution of his deficits. He was monitored in a in the intensive care unit where blood pressure was tightly controlled. MRI scan showed a 1 cm right medial thalamic infarct. CT angiogram showed patent basilar stent aneurysm coiled without any aneurysm regrowth. Echocardiogram was unremarkable. Telemetry monitoring did not reveal cardiac arrhythmias. 2-D echo with bubble study was unremarkable but transcranial Doppler bubble study suggested medium size right to left shunt.  TEE was not done due to  constraints from the ongoing coronavirus pandemic.  B venous Dopplers were negative for DVT. He underwent loop recorder insertion to monitor for paroxysmal A. Fib.LDL cholesterol was borderline at 72 mg percent and hemoglobin A1c was 5.9. He was seen by physical occupational speech therapy and felt to have no therapy needs. He was discharged home with instructions for outpatient follow-up in the stroke clinic in 6 weeks and to take aspirin and Plavix for 3 weeks followed by aspirin alone. Possible participation in the sleep smart stroke study was discussed but patient declined.  Patient states is done well since discharge.  His left-sided weakness and numbness are completely resolved.  He is able to ambulate independently.  Tolerating aspirin well without bleeding or bruising.  He stopped Plavix as instructed after 3 weeks.  He states his sugars are well controlled.  He has in fact no complaints.  He had a loop recorder inserted and so for paroxysmal A. fib has not yet been found Observations/Objective: Physical and neurological exam limited due to constraints from video visit.  Patient is pleasant awake alert cooperative.  Speech and language appear normal.  Extraocular muscles are full range without nystagmus.  Motor system exam shows no focal deficits.  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  Etiology of stroke cryptogenic.  Remote history of subarachnoid hemorrhage status post coiling of basilar tip aneurysm.  Prior history of posterior circulation TIA August 2015.  Vascular risk factors of diabetes, hyperlipidemia history of TIA,  PFO and mild obesity  Plan: I had a long discussion with the patient regarding his stroke and discuss plan for evaluation treatment and answered questions.  Continue aspirin for stroke prevention with strict control of hypertension with blood pressure goal below  130/90, lipids with LDL cholesterol goal below 70 mg percent diabetes with hemoglobin A1c goal below 6.5%.  I also encouraged him to eat a healthy diet with lots of fruits, vegetables, cereals and whole grains.  Encouraged him to exercise regularly and lose weight.  Also discussed role of PFO presenting stroke and possible benefit of endovascular PFO closure.  He has ROPE score of 4 points with only a 38% chance that PFO is to blame for his stroke but clearly had a life altering stroke and without TPA he likely would have had significant disability hence   endovascular PFO closure may be conmsidered Follow Up Instructions: Refer to Dr. Tonny Bollman consideration for endovascular PFO closure and arranging TEE PFO  Follow-up with me in clinic in 3 months   I discussed the assessment and treatment plan with the patient. The patient was provided an opportunity to ask questions and all were answered. The patient agreed with the plan and demonstrated an understanding of the instructions.   The patient was advised to call back or seek an in-person evaluation if the symptoms worsen or if the condition fails to improve as anticipated.  I provided 25 minutes of non-face-to-face time during this encounter.   Delia Heady, MD

## 2018-06-16 ENCOUNTER — Ambulatory Visit (INDEPENDENT_AMBULATORY_CARE_PROVIDER_SITE_OTHER): Payer: Self-pay | Admitting: *Deleted

## 2018-06-16 ENCOUNTER — Telehealth: Payer: Self-pay

## 2018-06-16 DIAGNOSIS — G459 Transient cerebral ischemic attack, unspecified: Secondary | ICD-10-CM

## 2018-06-16 NOTE — Telephone Encounter (Signed)
Called to arrange e-consult with Dr. Excell Seltzer.  Left message to call back.

## 2018-06-16 NOTE — Telephone Encounter (Signed)
-----   Message from Tonny Bollman, MD sent at 06/12/2018  7:50 AM EDT ----- Regarding: RE: TEE and PFO closure on same day Thanks will do ----- Message ----- From: Micki Riley, MD Sent: 06/10/2018  10:20 AM EDT To: Tonny Bollman, MD Subject: TEE and PFO closure on same day                Here is a pt  scenario we discussed. Youu can do office consult and then arrange TEE and closure on same day

## 2018-06-17 LAB — CUP PACEART REMOTE DEVICE CHECK
Date Time Interrogation Session: 20200525164149
Implantable Pulse Generator Implant Date: 20200320

## 2018-06-17 NOTE — Telephone Encounter (Signed)
Will offer the patient e-consult with Dr. Excell Seltzer this Friday at 1100.  Left message to call back.

## 2018-06-18 NOTE — Telephone Encounter (Signed)
Scheduled the patient 6/5 for in-office consult with Dr. Excell Seltzer.  Tentatively scheduled the patient for COVID testing 6/8. Tentatively scheduled TEE and PFO closure 6/10. Will confirm procedures at consult and arrange blood work and review instructions at that time (or cancel if it is decided not to proceed).  The patient agrees with plan.

## 2018-06-25 NOTE — Progress Notes (Signed)
Carelink Summary Report / Loop Recorder 

## 2018-06-26 ENCOUNTER — Telehealth: Payer: Self-pay

## 2018-06-26 ENCOUNTER — Encounter: Payer: Medicaid Other | Admitting: Cardiovascular Disease

## 2018-06-26 NOTE — Telephone Encounter (Signed)
Richard Davenport no-showed for his PFO consult today. He will need to be rescheduled at a later date. TEE/PFO closure will need to be postponed to allow for COVID testing.  Left message to call back to review plan.

## 2018-06-29 ENCOUNTER — Other Ambulatory Visit (HOSPITAL_COMMUNITY): Payer: Medicaid Other

## 2018-07-01 ENCOUNTER — Encounter (HOSPITAL_COMMUNITY): Admission: RE | Payer: Self-pay | Source: Home / Self Care

## 2018-07-01 ENCOUNTER — Ambulatory Visit (HOSPITAL_COMMUNITY): Admission: RE | Admit: 2018-07-01 | Payer: Medicaid Other | Source: Home / Self Care | Admitting: Cardiovascular Disease

## 2018-07-01 SURGERY — ECHOCARDIOGRAM, TRANSESOPHAGEAL
Anesthesia: Moderate Sedation

## 2018-07-01 SURGERY — PATENT FORAMEN OVALE (PFO) CLOSURE
Anesthesia: LOCAL

## 2018-07-14 NOTE — Telephone Encounter (Signed)
Attempted to schedule patient for consult with Dr. Burt Knack this Thursday at 1400.  Left message to call back.

## 2018-07-15 ENCOUNTER — Other Ambulatory Visit: Payer: Self-pay

## 2018-07-15 NOTE — Telephone Encounter (Signed)
Left message to call back  

## 2018-07-15 NOTE — Patient Outreach (Signed)
First attempt to obtain mRs. No answer. Left message for return call.  

## 2018-07-19 LAB — CUP PACEART REMOTE DEVICE CHECK
Date Time Interrogation Session: 20200627184046
Implantable Pulse Generator Implant Date: 20200320

## 2018-07-20 ENCOUNTER — Ambulatory Visit (INDEPENDENT_AMBULATORY_CARE_PROVIDER_SITE_OTHER): Payer: Self-pay | Admitting: *Deleted

## 2018-07-20 DIAGNOSIS — G459 Transient cerebral ischemic attack, unspecified: Secondary | ICD-10-CM

## 2018-07-21 ENCOUNTER — Other Ambulatory Visit: Payer: Self-pay

## 2018-07-21 NOTE — Progress Notes (Signed)
This encounter was created in error - please disregard.

## 2018-07-21 NOTE — Patient Outreach (Signed)
Second attempt to obtain mRs. Patient stepped out, left message for return call.

## 2018-07-23 ENCOUNTER — Other Ambulatory Visit: Payer: Self-pay

## 2018-07-23 NOTE — Patient Outreach (Signed)
3 outreach attempts were completed to obtain mRs. mRs could not be obtained because patient never returned my calls. mRs=7 

## 2018-07-27 NOTE — Telephone Encounter (Signed)
Left message to call back if he decides to schedule consult.  Letter mailed to the patient.

## 2018-07-29 NOTE — Progress Notes (Signed)
Carelink Summary Report / Loop Recorder 

## 2018-08-20 ENCOUNTER — Ambulatory Visit (INDEPENDENT_AMBULATORY_CARE_PROVIDER_SITE_OTHER): Payer: Self-pay | Admitting: *Deleted

## 2018-08-20 DIAGNOSIS — G459 Transient cerebral ischemic attack, unspecified: Secondary | ICD-10-CM

## 2018-08-21 LAB — CUP PACEART REMOTE DEVICE CHECK
Date Time Interrogation Session: 20200730194058
Implantable Pulse Generator Implant Date: 20200320

## 2018-08-28 NOTE — Progress Notes (Signed)
Carelink Summary Report / Loop Recorder 

## 2018-09-22 ENCOUNTER — Ambulatory Visit (INDEPENDENT_AMBULATORY_CARE_PROVIDER_SITE_OTHER): Payer: Medicaid Other | Admitting: *Deleted

## 2018-09-22 DIAGNOSIS — G459 Transient cerebral ischemic attack, unspecified: Secondary | ICD-10-CM | POA: Diagnosis not present

## 2018-09-22 LAB — CUP PACEART REMOTE DEVICE CHECK
Date Time Interrogation Session: 20200901172714
Implantable Pulse Generator Implant Date: 20200320

## 2018-10-06 NOTE — Progress Notes (Signed)
Carelink Summary Report / Loop Recorder 

## 2018-10-25 LAB — CUP PACEART REMOTE DEVICE CHECK
Date Time Interrogation Session: 20201004194219
Implantable Pulse Generator Implant Date: 20200320

## 2018-10-26 ENCOUNTER — Ambulatory Visit (INDEPENDENT_AMBULATORY_CARE_PROVIDER_SITE_OTHER): Payer: Medicaid Other | Admitting: *Deleted

## 2018-10-26 DIAGNOSIS — G459 Transient cerebral ischemic attack, unspecified: Secondary | ICD-10-CM

## 2018-11-02 NOTE — Progress Notes (Signed)
Carelink Summary Report / Loop Recorder 

## 2018-11-27 ENCOUNTER — Ambulatory Visit (INDEPENDENT_AMBULATORY_CARE_PROVIDER_SITE_OTHER): Payer: Medicaid Other | Admitting: *Deleted

## 2018-11-27 DIAGNOSIS — G459 Transient cerebral ischemic attack, unspecified: Secondary | ICD-10-CM

## 2018-11-28 LAB — CUP PACEART REMOTE DEVICE CHECK
Date Time Interrogation Session: 20201106201917
Implantable Pulse Generator Implant Date: 20200320

## 2018-12-13 NOTE — Progress Notes (Signed)
Carelink Summary Report / Loop Recorder 

## 2018-12-30 ENCOUNTER — Ambulatory Visit (INDEPENDENT_AMBULATORY_CARE_PROVIDER_SITE_OTHER): Payer: Medicaid Other | Admitting: *Deleted

## 2018-12-30 DIAGNOSIS — I6381 Other cerebral infarction due to occlusion or stenosis of small artery: Secondary | ICD-10-CM

## 2018-12-30 DIAGNOSIS — I639 Cerebral infarction, unspecified: Secondary | ICD-10-CM

## 2018-12-31 LAB — CUP PACEART REMOTE DEVICE CHECK
Date Time Interrogation Session: 20201209152102
Implantable Pulse Generator Implant Date: 20200320

## 2019-02-01 ENCOUNTER — Ambulatory Visit (INDEPENDENT_AMBULATORY_CARE_PROVIDER_SITE_OTHER): Payer: Medicaid Other | Admitting: *Deleted

## 2019-02-01 DIAGNOSIS — I639 Cerebral infarction, unspecified: Secondary | ICD-10-CM | POA: Diagnosis not present

## 2019-02-01 DIAGNOSIS — I6381 Other cerebral infarction due to occlusion or stenosis of small artery: Secondary | ICD-10-CM

## 2019-02-01 LAB — CUP PACEART REMOTE DEVICE CHECK
Date Time Interrogation Session: 20210111162701
Implantable Pulse Generator Implant Date: 20200320

## 2019-03-04 ENCOUNTER — Ambulatory Visit (INDEPENDENT_AMBULATORY_CARE_PROVIDER_SITE_OTHER): Payer: Medicaid Other | Admitting: *Deleted

## 2019-03-04 DIAGNOSIS — I6381 Other cerebral infarction due to occlusion or stenosis of small artery: Secondary | ICD-10-CM

## 2019-03-04 DIAGNOSIS — I639 Cerebral infarction, unspecified: Secondary | ICD-10-CM

## 2019-03-04 LAB — CUP PACEART REMOTE DEVICE CHECK
Date Time Interrogation Session: 20210210232645
Implantable Pulse Generator Implant Date: 20200320

## 2019-03-04 NOTE — Progress Notes (Signed)
ILR Remote 

## 2019-04-05 ENCOUNTER — Ambulatory Visit (INDEPENDENT_AMBULATORY_CARE_PROVIDER_SITE_OTHER): Payer: Medicaid Other | Admitting: *Deleted

## 2019-04-05 DIAGNOSIS — I639 Cerebral infarction, unspecified: Secondary | ICD-10-CM | POA: Diagnosis not present

## 2019-04-05 DIAGNOSIS — I6381 Other cerebral infarction due to occlusion or stenosis of small artery: Secondary | ICD-10-CM

## 2019-04-05 LAB — CUP PACEART REMOTE DEVICE CHECK
Date Time Interrogation Session: 20210314001110
Implantable Pulse Generator Implant Date: 20200320

## 2019-04-05 NOTE — Progress Notes (Signed)
ILR Remote 

## 2019-05-06 ENCOUNTER — Ambulatory Visit (INDEPENDENT_AMBULATORY_CARE_PROVIDER_SITE_OTHER): Payer: Medicaid Other | Admitting: *Deleted

## 2019-05-06 DIAGNOSIS — I6381 Other cerebral infarction due to occlusion or stenosis of small artery: Secondary | ICD-10-CM

## 2019-05-06 DIAGNOSIS — I639 Cerebral infarction, unspecified: Secondary | ICD-10-CM | POA: Diagnosis not present

## 2019-05-06 LAB — CUP PACEART REMOTE DEVICE CHECK
Date Time Interrogation Session: 20210415123228
Implantable Pulse Generator Implant Date: 20200320

## 2019-05-07 NOTE — Progress Notes (Signed)
ILR Remote 

## 2019-06-07 ENCOUNTER — Ambulatory Visit (INDEPENDENT_AMBULATORY_CARE_PROVIDER_SITE_OTHER): Payer: Commercial Managed Care - PPO | Admitting: *Deleted

## 2019-06-07 DIAGNOSIS — G459 Transient cerebral ischemic attack, unspecified: Secondary | ICD-10-CM

## 2019-06-07 LAB — CUP PACEART REMOTE DEVICE CHECK
Date Time Interrogation Session: 20210516123650
Implantable Pulse Generator Implant Date: 20200320

## 2019-06-08 NOTE — Progress Notes (Signed)
Carelink Summary Report / Loop Recorder 

## 2019-07-08 ENCOUNTER — Ambulatory Visit (INDEPENDENT_AMBULATORY_CARE_PROVIDER_SITE_OTHER): Payer: Commercial Managed Care - PPO | Admitting: *Deleted

## 2019-07-08 DIAGNOSIS — G459 Transient cerebral ischemic attack, unspecified: Secondary | ICD-10-CM

## 2019-07-08 LAB — CUP PACEART REMOTE DEVICE CHECK
Date Time Interrogation Session: 20210616234134
Implantable Pulse Generator Implant Date: 20200320

## 2019-07-08 NOTE — Progress Notes (Signed)
Carelink Summary Report / Loop Recorder 

## 2019-08-09 ENCOUNTER — Ambulatory Visit (INDEPENDENT_AMBULATORY_CARE_PROVIDER_SITE_OTHER): Payer: Commercial Managed Care - PPO | Admitting: *Deleted

## 2019-08-09 DIAGNOSIS — G459 Transient cerebral ischemic attack, unspecified: Secondary | ICD-10-CM | POA: Diagnosis not present

## 2019-08-09 LAB — CUP PACEART REMOTE DEVICE CHECK
Date Time Interrogation Session: 20210718231655
Implantable Pulse Generator Implant Date: 20200320

## 2019-08-11 NOTE — Progress Notes (Signed)
Carelink Summary Report / Loop Recorder 

## 2019-09-09 ENCOUNTER — Ambulatory Visit (INDEPENDENT_AMBULATORY_CARE_PROVIDER_SITE_OTHER): Payer: Commercial Managed Care - PPO | Admitting: *Deleted

## 2019-09-09 DIAGNOSIS — I671 Cerebral aneurysm, nonruptured: Secondary | ICD-10-CM | POA: Diagnosis not present

## 2019-09-13 LAB — CUP PACEART REMOTE DEVICE CHECK
Date Time Interrogation Session: 20210820231828
Implantable Pulse Generator Implant Date: 20200320

## 2019-09-13 NOTE — Progress Notes (Signed)
Carelink Summary Report / Loop Recorder 

## 2019-10-11 ENCOUNTER — Ambulatory Visit (INDEPENDENT_AMBULATORY_CARE_PROVIDER_SITE_OTHER): Payer: Commercial Managed Care - PPO | Admitting: *Deleted

## 2019-10-11 DIAGNOSIS — G459 Transient cerebral ischemic attack, unspecified: Secondary | ICD-10-CM

## 2019-10-15 LAB — CUP PACEART REMOTE DEVICE CHECK
Date Time Interrogation Session: 20210922233509
Implantable Pulse Generator Implant Date: 20200320

## 2019-10-18 NOTE — Progress Notes (Signed)
Carelink Summary Report / Loop Recorder 

## 2019-11-11 ENCOUNTER — Ambulatory Visit (INDEPENDENT_AMBULATORY_CARE_PROVIDER_SITE_OTHER): Payer: Commercial Managed Care - PPO

## 2019-11-11 DIAGNOSIS — G459 Transient cerebral ischemic attack, unspecified: Secondary | ICD-10-CM | POA: Diagnosis not present

## 2019-11-16 LAB — CUP PACEART REMOTE DEVICE CHECK
Date Time Interrogation Session: 20211026000122
Implantable Pulse Generator Implant Date: 20200320

## 2019-11-17 NOTE — Progress Notes (Signed)
Carelink Summary Report / Loop Recorder 

## 2019-12-19 LAB — CUP PACEART REMOTE DEVICE CHECK
Date Time Interrogation Session: 20211127231255
Implantable Pulse Generator Implant Date: 20200320

## 2019-12-20 ENCOUNTER — Ambulatory Visit (INDEPENDENT_AMBULATORY_CARE_PROVIDER_SITE_OTHER): Payer: Commercial Managed Care - PPO

## 2019-12-20 DIAGNOSIS — G459 Transient cerebral ischemic attack, unspecified: Secondary | ICD-10-CM

## 2019-12-27 NOTE — Progress Notes (Signed)
Carelink Summary Report / Loop Recorder 

## 2020-01-24 ENCOUNTER — Ambulatory Visit (INDEPENDENT_AMBULATORY_CARE_PROVIDER_SITE_OTHER): Payer: Commercial Managed Care - PPO

## 2020-01-24 DIAGNOSIS — G459 Transient cerebral ischemic attack, unspecified: Secondary | ICD-10-CM | POA: Diagnosis not present

## 2020-01-25 LAB — CUP PACEART REMOTE DEVICE CHECK
Date Time Interrogation Session: 20220101233006
Implantable Pulse Generator Implant Date: 20200320

## 2020-02-08 NOTE — Progress Notes (Signed)
[facility] Summary Report / Loop Recorder ° °

## 2020-04-03 ENCOUNTER — Ambulatory Visit (INDEPENDENT_AMBULATORY_CARE_PROVIDER_SITE_OTHER): Payer: Commercial Managed Care - PPO

## 2020-04-03 DIAGNOSIS — G459 Transient cerebral ischemic attack, unspecified: Secondary | ICD-10-CM | POA: Diagnosis not present

## 2020-04-05 LAB — CUP PACEART REMOTE DEVICE CHECK
Date Time Interrogation Session: 20220309004942
Implantable Pulse Generator Implant Date: 20200320

## 2020-04-12 NOTE — Progress Notes (Signed)
Carelink Summary Report / Loop Recorder 

## 2020-05-08 ENCOUNTER — Ambulatory Visit (INDEPENDENT_AMBULATORY_CARE_PROVIDER_SITE_OTHER): Payer: Commercial Managed Care - PPO

## 2020-05-08 DIAGNOSIS — G459 Transient cerebral ischemic attack, unspecified: Secondary | ICD-10-CM | POA: Diagnosis not present

## 2020-05-09 LAB — CUP PACEART REMOTE DEVICE CHECK
Date Time Interrogation Session: 20220411015227
Implantable Pulse Generator Implant Date: 20200320

## 2020-05-24 NOTE — Progress Notes (Signed)
Carelink Summary Report / Loop Recorder 

## 2020-06-08 LAB — CUP PACEART REMOTE DEVICE CHECK
Date Time Interrogation Session: 20220514015314
Implantable Pulse Generator Implant Date: 20200320

## 2020-06-12 ENCOUNTER — Ambulatory Visit (INDEPENDENT_AMBULATORY_CARE_PROVIDER_SITE_OTHER): Payer: Commercial Managed Care - PPO

## 2020-06-12 DIAGNOSIS — I671 Cerebral aneurysm, nonruptured: Secondary | ICD-10-CM

## 2020-07-04 NOTE — Progress Notes (Signed)
Carelink Summary Report / Loop Recorder 

## 2020-07-10 ENCOUNTER — Ambulatory Visit (INDEPENDENT_AMBULATORY_CARE_PROVIDER_SITE_OTHER): Payer: Self-pay

## 2020-07-10 DIAGNOSIS — G459 Transient cerebral ischemic attack, unspecified: Secondary | ICD-10-CM

## 2020-07-11 LAB — CUP PACEART REMOTE DEVICE CHECK
Date Time Interrogation Session: 20220616015714
Implantable Pulse Generator Implant Date: 20200320

## 2020-07-31 NOTE — Progress Notes (Signed)
Carelink Summary Report / Loop Recorder 

## 2020-08-09 NOTE — Addendum Note (Signed)
Addended by: Geralyn Flash D on: 08/09/2020 09:09 AM   Modules accepted: Level of Service

## 2020-08-10 LAB — CUP PACEART REMOTE DEVICE CHECK
Date Time Interrogation Session: 20220719015942
Implantable Pulse Generator Implant Date: 20200320

## 2020-08-14 ENCOUNTER — Ambulatory Visit (INDEPENDENT_AMBULATORY_CARE_PROVIDER_SITE_OTHER): Payer: Commercial Managed Care - PPO

## 2020-08-14 DIAGNOSIS — G459 Transient cerebral ischemic attack, unspecified: Secondary | ICD-10-CM | POA: Diagnosis not present

## 2020-09-08 NOTE — Progress Notes (Signed)
Carelink Summary Report / Loop Recorder 

## 2020-09-18 ENCOUNTER — Ambulatory Visit (INDEPENDENT_AMBULATORY_CARE_PROVIDER_SITE_OTHER): Payer: Commercial Managed Care - PPO

## 2020-09-18 DIAGNOSIS — G459 Transient cerebral ischemic attack, unspecified: Secondary | ICD-10-CM

## 2020-09-19 LAB — CUP PACEART REMOTE DEVICE CHECK
Date Time Interrogation Session: 20220821020356
Implantable Pulse Generator Implant Date: 20200320

## 2020-09-29 NOTE — Progress Notes (Signed)
Carelink Summary Report / Loop Recorder 

## 2020-10-18 LAB — CUP PACEART REMOTE DEVICE CHECK
Date Time Interrogation Session: 20220923020845
Implantable Pulse Generator Implant Date: 20200320

## 2020-10-19 ENCOUNTER — Ambulatory Visit (INDEPENDENT_AMBULATORY_CARE_PROVIDER_SITE_OTHER): Payer: Commercial Managed Care - PPO

## 2020-10-19 DIAGNOSIS — G459 Transient cerebral ischemic attack, unspecified: Secondary | ICD-10-CM | POA: Diagnosis not present

## 2020-10-27 NOTE — Progress Notes (Signed)
Carelink Summary Report / Loop Recorder 

## 2020-11-15 LAB — CUP PACEART REMOTE DEVICE CHECK
Date Time Interrogation Session: 20221026021235
Implantable Pulse Generator Implant Date: 20200320

## 2020-11-21 ENCOUNTER — Ambulatory Visit (INDEPENDENT_AMBULATORY_CARE_PROVIDER_SITE_OTHER): Payer: Commercial Managed Care - PPO

## 2020-11-21 DIAGNOSIS — G459 Transient cerebral ischemic attack, unspecified: Secondary | ICD-10-CM | POA: Diagnosis not present

## 2020-11-28 NOTE — Progress Notes (Signed)
Carelink Summary Report / Loop Recorder 

## 2020-12-18 ENCOUNTER — Ambulatory Visit (INDEPENDENT_AMBULATORY_CARE_PROVIDER_SITE_OTHER): Payer: Self-pay

## 2020-12-18 DIAGNOSIS — G459 Transient cerebral ischemic attack, unspecified: Secondary | ICD-10-CM

## 2020-12-18 LAB — CUP PACEART REMOTE DEVICE CHECK
Date Time Interrogation Session: 20221128011830
Implantable Pulse Generator Implant Date: 20200320

## 2020-12-27 NOTE — Progress Notes (Signed)
Carelink Summary Report / Loop Recorder 

## 2020-12-29 NOTE — Addendum Note (Signed)
Addended by: Geralyn Flash D on: 12/29/2020 09:14 AM   Modules accepted: Level of Service

## 2021-01-23 ENCOUNTER — Ambulatory Visit (INDEPENDENT_AMBULATORY_CARE_PROVIDER_SITE_OTHER): Payer: Commercial Managed Care - PPO

## 2021-01-23 DIAGNOSIS — G459 Transient cerebral ischemic attack, unspecified: Secondary | ICD-10-CM

## 2021-01-23 LAB — CUP PACEART REMOTE DEVICE CHECK
Date Time Interrogation Session: 20230102233029
Implantable Pulse Generator Implant Date: 20200320

## 2021-02-02 NOTE — Progress Notes (Signed)
Carelink Summary Report / Loop Recorder 

## 2021-02-26 ENCOUNTER — Ambulatory Visit (INDEPENDENT_AMBULATORY_CARE_PROVIDER_SITE_OTHER): Payer: Commercial Managed Care - PPO

## 2021-02-26 DIAGNOSIS — G459 Transient cerebral ischemic attack, unspecified: Secondary | ICD-10-CM | POA: Diagnosis not present

## 2021-02-26 LAB — CUP PACEART REMOTE DEVICE CHECK
Date Time Interrogation Session: 20230205230738
Implantable Pulse Generator Implant Date: 20200320

## 2021-03-01 NOTE — Progress Notes (Signed)
Carelink Summary Report / Loop Recorder 

## 2021-04-02 ENCOUNTER — Ambulatory Visit (INDEPENDENT_AMBULATORY_CARE_PROVIDER_SITE_OTHER): Payer: Commercial Managed Care - PPO

## 2021-04-02 DIAGNOSIS — G459 Transient cerebral ischemic attack, unspecified: Secondary | ICD-10-CM | POA: Diagnosis not present

## 2021-04-02 LAB — CUP PACEART REMOTE DEVICE CHECK
Date Time Interrogation Session: 20230312231941
Implantable Pulse Generator Implant Date: 20200320

## 2021-04-13 NOTE — Progress Notes (Signed)
Carelink Summary Report / Loop Recorder 

## 2021-05-07 ENCOUNTER — Ambulatory Visit (INDEPENDENT_AMBULATORY_CARE_PROVIDER_SITE_OTHER): Payer: Commercial Managed Care - PPO

## 2021-05-07 DIAGNOSIS — G459 Transient cerebral ischemic attack, unspecified: Secondary | ICD-10-CM

## 2021-05-07 LAB — CUP PACEART REMOTE DEVICE CHECK
Date Time Interrogation Session: 20230414231243
Implantable Pulse Generator Implant Date: 20200320

## 2021-05-23 NOTE — Progress Notes (Signed)
Carelink Summary Report / Loop Recorder 

## 2021-06-08 LAB — CUP PACEART REMOTE DEVICE CHECK
Date Time Interrogation Session: 20230517232058
Implantable Pulse Generator Implant Date: 20200320

## 2021-06-11 ENCOUNTER — Ambulatory Visit (INDEPENDENT_AMBULATORY_CARE_PROVIDER_SITE_OTHER): Payer: Commercial Managed Care - PPO

## 2021-06-11 DIAGNOSIS — G459 Transient cerebral ischemic attack, unspecified: Secondary | ICD-10-CM | POA: Diagnosis not present

## 2021-06-27 NOTE — Progress Notes (Signed)
Carelink Summary Report / Loop Recorder 

## 2021-07-16 ENCOUNTER — Ambulatory Visit (INDEPENDENT_AMBULATORY_CARE_PROVIDER_SITE_OTHER): Payer: Commercial Managed Care - PPO

## 2021-07-16 DIAGNOSIS — G459 Transient cerebral ischemic attack, unspecified: Secondary | ICD-10-CM

## 2021-07-17 LAB — CUP PACEART REMOTE DEVICE CHECK
Date Time Interrogation Session: 20230619232449
Implantable Pulse Generator Implant Date: 20200320

## 2021-08-09 NOTE — Progress Notes (Signed)
Carelink Summary Report / Loop Recorder 

## 2021-10-17 ENCOUNTER — Ambulatory Visit (INDEPENDENT_AMBULATORY_CARE_PROVIDER_SITE_OTHER): Payer: Commercial Managed Care - PPO

## 2021-10-17 DIAGNOSIS — G459 Transient cerebral ischemic attack, unspecified: Secondary | ICD-10-CM

## 2021-10-17 LAB — CUP PACEART REMOTE DEVICE CHECK
Date Time Interrogation Session: 20230926233650
Implantable Pulse Generator Implant Date: 20200320

## 2021-10-25 NOTE — Progress Notes (Signed)
Carelink Summary Report / Loop Recorder 

## 2021-11-19 ENCOUNTER — Ambulatory Visit (INDEPENDENT_AMBULATORY_CARE_PROVIDER_SITE_OTHER): Payer: Commercial Managed Care - PPO

## 2021-11-19 DIAGNOSIS — G459 Transient cerebral ischemic attack, unspecified: Secondary | ICD-10-CM | POA: Diagnosis not present

## 2021-11-20 LAB — CUP PACEART REMOTE DEVICE CHECK
Date Time Interrogation Session: 20231029233910
Implantable Pulse Generator Implant Date: 20200320

## 2021-12-03 ENCOUNTER — Telehealth: Payer: Self-pay

## 2021-12-03 NOTE — Telephone Encounter (Signed)
ILR @ [facility] 11/12/21.  Attempted to contact patient to discuss options of explant/leave in. No answer, unable to leave VM d/t full.   -Marked "I" in P[employer]. - Discontinued from [facility]. - Canceled future apts in EPIC

## 2021-12-04 NOTE — Telephone Encounter (Signed)
Attempted to contact patient to discuss options of explant/leave in. No answer, unable to leave VM d/t full.

## 2021-12-05 NOTE — Telephone Encounter (Signed)
Spoke to patient about ILR @ [facility]. Discussed options to leave device in or explant. Patient will leave device in. Advised to unplug remote monitor and I will send return kit to ship back to [facility]. Voiced understanding. Advised if he has further questions or concerns to please call back. Appreciative of call.   Return kit requested.

## 2021-12-22 NOTE — Progress Notes (Signed)
Carelink Summary Report / Loop Recorder 

## 2021-12-26 ENCOUNTER — Ambulatory Visit (INDEPENDENT_AMBULATORY_CARE_PROVIDER_SITE_OTHER): Payer: Commercial Managed Care - PPO | Admitting: Pulmonary Disease

## 2021-12-26 ENCOUNTER — Encounter: Payer: Self-pay | Admitting: Pulmonary Disease

## 2021-12-26 VITALS — BP 122/80 | HR 62 | Temp 97.9°F | Ht 68.0 in

## 2021-12-26 DIAGNOSIS — J849 Interstitial pulmonary disease, unspecified: Secondary | ICD-10-CM

## 2021-12-26 MED ORDER — BENZONATATE 200 MG PO CAPS: 200.0000 mg | ORAL_CAPSULE | Freq: Three times a day (TID) | ORAL | 1 refills | Status: DC | PRN

## 2021-12-26 NOTE — Patient Instructions (Addendum)
  Pulmonary function test  6-minute walk  Echocardiogram to assess right-sided heart pressures for shortness of breath  Blood work will be ordered  High-resolution CT scan of the chest, CT scan of the chest with contrast -Patient does have scarring in the lungs and also lymph nodes noted  Follow-up in 6 to 8 weeks  Tessalon Perles prescription sent in  Delsym which is over-the-counter may also help the cough  Strongly consider a job change as you do not want anything that places your lungs at risk  Information so far is consistent with having scarring in your lungs

## 2021-12-26 NOTE — Progress Notes (Signed)
Richard Davenport    [id number]    1960-10-09  Primary Care Physician:Young, Lorriane Shire, PA  Referring Physician: Heron Nay, PA [address] [place],  [place] [address]  Chief complaint:   Chronic cough about 2 to 3 years  HPI:  Recently traveled to [place] where he had a CT scan of the chest done which showed evidence of bronchiectasis, honeycombing, suspicious for UIP  He has had a dry cough about 2 to 3 years  History of diabetes which is well-controlled History of CVA for which he had coils placed in 2014 and also 2020  Past history of chronic headaches which is better at present  Recently noted to be anemic  No pets  Never smoker  No alcohol use  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   metFORMIN (GLUCOPHAGE) 500 MG tablet Take 1 tablet (500 mg total) by mouth 2 (two) times daily.   aspirin EC 81 MG EC tablet Take 1 tablet (81 mg total) by mouth daily. (Patient not taking: Reported on 12/26/2021)   clopidogrel (PLAVIX) 75 MG tablet Take 1 tablet (75 mg total) by mouth daily. Only take for 21 days. Take with aspirin and Continue aspirin once you stop plavix (Patient not taking: Reported on 12/26/2021)   No facility-administered encounter medications on file as of 12/26/2021.    Allergies as of 12/26/2021 - Review Complete 12/26/2021  Allergen Reaction Noted   Pork-derived products Other (See  Comments) 10/11/2012    Past Medical History:  Diagnosis Date   Headache    Stroke (HCC)    Subarachnoid hemorrhage (HCC)     Past Surgical History:  Procedure Laterality Date   ANEURYSM COILING     for bleed   LOOP RECORDER INSERTION N/A 04/10/2018   Procedure: LOOP RECORDER INSERTION;  Surgeon: Duke Salvia, MD;  Location: [facility] INVASIVE CV LAB;  Service: Cardiovascular;  Laterality: N/A;   RADIOLOGY WITH ANESTHESIA N/A 10/09/2012   Procedure: RADIOLOGY WITH ANESTHESIA;  Surgeon: Lisbeth Renshaw, MD;  Location: [facility] OR;  Service: Radiology;  Laterality: N/A;   RADIOLOGY WITH ANESTHESIA N/A 05/27/2013   Procedure: RADIOLOGY WITH ANESTHESIA;  Surgeon: Lisbeth Renshaw, MD;  Location: [facility] OR;  Service: Radiology;  Laterality: N/A;    Family History  Problem Relation Age of Onset   Hypertension Mother    Hypertension Father     Social History   Socioeconomic History   Marital status: Married    Spouse name: Not on file   Number of children: Not on file   Years of education: Not on file   Highest education level: Not on file  Occupational History   Not on file  Tobacco Use   Smoking status: Never   Smokeless tobacco: Never  Vaping Use   Vaping Use: Never used  Substance and Sexual Activity   Alcohol  use: No   Drug use: No   Sexual activity: Not Currently  Other Topics Concern   Not on file  Social History Narrative   Not on file   Social Determinants of Health   Financial Resource Strain: Not on file  Food Insecurity: Not on file  Transportation Needs: Not on file  Physical Activity: Not on file  Stress: Not on file  Social Connections: Not on file  Intimate Partner Violence: Not on file    Review of Systems  Respiratory:  Positive for cough and shortness of breath.     Vitals:   12/26/21 0958  BP: 122/80  Pulse: 62  Temp: 97.9 F (36.6 C)  SpO2: 94%     Physical Exam Constitutional:      Appearance: Normal appearance.  HENT:     Head:  Normocephalic.     Mouth/Throat:     Mouth: Mucous membranes are moist.  Cardiovascular:     Rate and Rhythm: Normal rate and regular rhythm.     Heart sounds: No murmur heard.    No friction rub.  Pulmonary:     Effort: No respiratory distress.     Breath sounds: No stridor. Rales present. No wheezing or rhonchi.  Musculoskeletal:     Cervical back: No rigidity or tenderness.  Neurological:     Mental Status: He is alert.  Psychiatric:        Mood and Affect: Mood normal.   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 with fibrosis, bronchiectasis, traction, honeycombing -Actual CT films were reviewed  IgE level was elevated  Assessment:  Chronic cough  Findings so far concerning for interstitial lung disease  Abnormal CT scan of the chest    Plan/Recommendations: Pulmonary function test  6-minute walk  Echocardiogram to assess for right-sided heart pressures for shortness of breath  Blood work for connective tissue disease/chronic inflammatory disease  Tessalon Perles for cough  Delsym over-the-counter may be considered  Patient encouraged to look for a job in a clean environment that does not place his lungs at risk  Will need to consider antifibrotic's  Risk of progressive ILD was discussed with patient  Virl Diamond MD  Pulmonary and Critical Care 12/26/2021, 10:33 AM  CC: Heron Nay, PA

## 2021-12-27 ENCOUNTER — Telehealth: Payer: Self-pay | Admitting: Pulmonary Disease

## 2021-12-27 ENCOUNTER — Other Ambulatory Visit: Payer: Self-pay

## 2021-12-27 DIAGNOSIS — R0602 Shortness of breath: Secondary | ICD-10-CM

## 2021-12-27 DIAGNOSIS — J849 Interstitial pulmonary disease, unspecified: Secondary | ICD-10-CM

## 2021-12-27 DIAGNOSIS — R079 Chest pain, unspecified: Secondary | ICD-10-CM

## 2022-01-01 LAB — ANA,IFA RA DIAG PNL W/RFLX TIT/PATN
Anti Nuclear Antibody (ANA): NEGATIVE
Cyclic Citrullin Peptide Ab: <16 UNITS
Rheumatoid fact SerPl-aCnc: <14 IU/mL (ref <14)

## 2022-01-01 LAB — C ANCA  REFLEX TITER: C ANCA TITER: 1:40 {titer} — ABNORMAL HIGH

## 2022-01-01 LAB — ANTI-SCLERODERMA ANTIBODY: Scleroderma (Scl-70) (ENA) Antibody, IgG: <1 AI

## 2022-01-01 LAB — ANCA SCREEN W REFLEX TITER: ANCA SCREEN: POSITIVE — AB

## 2022-01-01 LAB — SJOGREN'S SYNDROME ANTIBODS(SSA + SSB)
SSA (Ro) (ENA) Antibody, IgG: <1 AI
SSB (La) (ENA) Antibody, IgG: <1 AI

## 2022-01-03 LAB — HYPERSENSITIVITY PNEUMONITIS
A. Pullulans Abs: NEGATIVE
A.Fumigatus #1 Abs: NEGATIVE
Micropolyspora faeni, IgG: NEGATIVE
Pigeon Serum Abs: NEGATIVE
Thermoact. Saccharii: NEGATIVE
Thermoactinomyces vulgaris, IgG: NEGATIVE

## 2022-01-03 NOTE — Telephone Encounter (Signed)
Echocardiogram order placed per Dr Os orders. PCCs will call patient to get this set up with patient. Nothing further needed

## 2022-01-03 NOTE — Telephone Encounter (Signed)
Pt daughter called. Cardiologist called to confirm a consult. She states dad does not need a consult but an echocardiogram. Cardiologist said we need to call back to order that before they can set appt for procedure. Daughter Geanie Logan is at [phone]

## 2022-01-03 NOTE — Telephone Encounter (Signed)
AO, please advise if you want just an echo for this patient. Thanks!

## 2022-01-03 NOTE — Telephone Encounter (Signed)
I just need an echo for right-sided pressures.  I do not recollect seeking cardiology consult  Okay to order echo

## 2022-01-04 ENCOUNTER — Telehealth: Payer: Self-pay | Admitting: Pulmonary Disease

## 2022-01-04 ENCOUNTER — Ambulatory Visit (INDEPENDENT_AMBULATORY_CARE_PROVIDER_SITE_OTHER): Payer: Commercial Managed Care - PPO | Admitting: Pulmonary Disease

## 2022-01-04 ENCOUNTER — Other Ambulatory Visit (HOSPITAL_COMMUNITY): Payer: Self-pay

## 2022-01-04 ENCOUNTER — Telehealth: Payer: Self-pay | Admitting: Pharmacist

## 2022-01-04 ENCOUNTER — Encounter (HOSPITAL_BASED_OUTPATIENT_CLINIC_OR_DEPARTMENT_OTHER): Payer: Self-pay | Admitting: Pulmonary Disease

## 2022-01-04 ENCOUNTER — Other Ambulatory Visit (HOSPITAL_BASED_OUTPATIENT_CLINIC_OR_DEPARTMENT_OTHER): Payer: Self-pay

## 2022-01-04 VITALS — BP 124/74 | HR 70 | Ht 68.0 in | Wt 178.0 lb

## 2022-01-04 DIAGNOSIS — I671 Cerebral aneurysm, nonruptured: Secondary | ICD-10-CM

## 2022-01-04 DIAGNOSIS — R0602 Shortness of breath: Secondary | ICD-10-CM

## 2022-01-04 DIAGNOSIS — J84112 Idiopathic pulmonary fibrosis: Secondary | ICD-10-CM

## 2022-01-04 LAB — PULMONARY FUNCTION TEST
DL/VA % pred: 64 %
DL/VA: 2.76 ml/min/mmHg/L
DLCO cor % pred: 39 %
DLCO cor: 10.6 ml/min/mmHg
DLCO unc % pred: 39 %
DLCO unc: 10.6 ml/min/mmHg
FEF 25-75 Post: 3.29 L/sec
FEF 25-75 Pre: 2.98 L/sec
FEF2575-%Change-Post: 10 %
FEF2575-%Pred-Post: 113 %
FEF2575-%Pred-Pre: 103 %
FEV1-%Change-Post: 3 %
FEV1-%Pred-Post: 73 %
FEV1-%Pred-Pre: 71 %
FEV1-Post: 2.54 L
FEV1-Pre: 2.47 L
FEV1FVC-%Change-Post: 3 %
FEV1FVC-%Pred-Pre: 109 %
FEV6-%Change-Post: 0 %
FEV6-%Pred-Post: 67 %
FEV6-%Pred-Pre: 67 %
FEV6-Post: 2.93 L
FEV6-Pre: 2.95 L
FEV6FVC-%Change-Post: 0 %
FEV6FVC-%Pred-Post: 104 %
FEV6FVC-%Pred-Pre: 104 %
FVC-%Change-Post: 0 %
FVC-%Pred-Post: 64 %
FVC-%Pred-Pre: 65 %
FVC-Post: 2.95 L
FVC-Pre: 2.96 L
Post FEV1/FVC ratio: 86 %
Post FEV6/FVC ratio: 99 %
Pre FEV1/FVC ratio: 83 %
Pre FEV6/FVC Ratio: 99 %

## 2022-01-04 MED ORDER — BUDESONIDE-FORMOTEROL FUMARATE 160-4.5 MCG/ACT IN AERO: 2.0000 | INHALATION_SPRAY | Freq: Two times a day (BID) | RESPIRATORY_TRACT | 5 refills | Status: DC

## 2022-01-04 NOTE — Progress Notes (Signed)
Attempted Full PFT Today. Pre/Post Spirometry and DLCO Performed.  

## 2022-01-04 NOTE — Patient Instructions (Addendum)
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 Rinse out after use --CONTINUE Albuterol AS NEEDED for shortness of breath or wheezing  Labs: CBC with diff, CMET, BNP Send message to pharmacy for antifibrotic initiation (nintedanib and pirfenidone) Echocardiogram ordered REFER to pulmonary rehab REFER to [facility] for transplant evaluation  Follow-up with Dr. Aldean Ast or Dr. Everardo All in 1 month

## 2022-01-04 NOTE — Telephone Encounter (Addendum)
Received notification from Dr. Everardo All of new start pirfenidone.  Submitted a Prior Authorization request to  [facility]  for PIRFENIDONE via CoverMyMeds. Will update once we receive a response.  Key: [id number]  Per automated response: Submit Bill To Other Processor Or Primary Payer  Chesley Mires, PharmD, MPH, BCPS, CPP Clinical Pharmacist (Rheumatology and Pulmonology)  ----- Message from Chi Mechele Collin, MD sent at 01/04/2022 12:37 PM EST ----- Please start enrollment for pirfenidone for IPF/ILD. If not eligible nintedanib is ok.

## 2022-01-04 NOTE — Telephone Encounter (Signed)
Ok to schedule.

## 2022-01-04 NOTE — Patient Instructions (Signed)
Attempted Full PFT Today. Pre/Post Spirometry and DLCO Performed.  

## 2022-01-04 NOTE — Telephone Encounter (Signed)
CT scheduled for 1/5 at 5pm   Assuming this CT is next avail that was the soonest offered when I spoke with radiology let me know if this is okay prior to calling pt  Thanks

## 2022-01-04 NOTE — Progress Notes (Signed)
Subjective:   PATIENT ID: Richard Davenport GENDER: male DOB: 1960-09-16, MRN: [id number]  Chief Complaint  Patient presents with   Follow-up    sob    Reason for Visit: Acute visit  Richard Davenport is a 61 year old male with hx stroke, SAH, DM2 who presents for acute visit. His daughter is present and provides additional history.  He is followed by Dr. Gala Murdoch and recent visit on 12/26/21 reviewed: "Recently traveled to [place] where he had a CT scan of the chest done which showed evidence of bronchiectasis, honeycombing, suspicious for UIP   He has had a dry cough about 2 to 3 years Is short of breath with activity but can walk about a mile Cough has no particular predilection for any particular time of the day"  Today he reports no improvement on tessalon perles or cough syrup. Daughter reports his shortness of breath is worsening along with cough. Denies wheezing. Occurs with exertion. Feels fatigued. Also has questions regarding his recent labwork.  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  Past Medical History:  Diagnosis Date   Headache    Stroke ([place])    Subarachnoid hemorrhage ([place])      Family History  Problem Relation Age of Onset   Hypertension Mother    Hypertension Father      Social History   Occupational History   Not on file  Tobacco Use   Smoking status: Never   Smokeless tobacco: Never  Vaping Use   Vaping Use: Never used  Substance and Sexual Activity   Alcohol use: No   Drug use: No   Sexual activity: Not Currently    Allergies  Allergen Reactions   Pork-Derived Products Other (See Comments)    Patient is Muslim and PREFERS TO NOT TAKE ANY PORK OR MEAT PRODUCTS (only fish)     Outpatient  Medications Prior to Visit  Medication Sig Dispense Refill   benzonatate (TESSALON) 200 MG capsule Take 1 capsule (200 mg total) by mouth 3 (three) times daily as needed for cough. 90 capsule 1   metFORMIN (GLUCOPHAGE) 500 MG tablet Take 1 tablet (500 mg total) by mouth 2 (two) times daily. 60 tablet 1   aspirin EC 81 MG EC tablet Take 1 tablet (81 mg total) by mouth daily. (Patient not taking: Reported on 12/26/2021)     clopidogrel (PLAVIX) 75 MG tablet Take 1 tablet (75 mg total) by mouth daily. Only take for 21 days. Take with aspirin and Continue aspirin once you stop plavix (Patient not taking: Reported on 12/26/2021) 21 tablet 0   No facility-administered medications prior to visit.    Review of Systems  Constitutional:  Positive for malaise/fatigue. Negative for chills, diaphoresis, fever and weight loss.  HENT:  Negative for congestion.   Respiratory:  Positive for cough and shortness of breath. Negative for hemoptysis, sputum production and wheezing.   Cardiovascular:  Negative for chest pain, palpitations and leg swelling.     Objective:   Vitals:   01/04/22 1142  BP: 124/74  Pulse: 70  SpO2: 98%  Weight: 178 lb (80.7 kg)  Height: _0  (1.727 m)   SpO2: 98 % O2 Device: None (Room air)  Physical Exam: General: Well-appearing, no acute distress HENT: [place],  AT Eyes: EOMI, no scleral icterus Respiratory: Inspiratory crackles, no wheezing or rhonchi. Cardiovascular: RRR, -M/R/G, no JVD Extremities:-Edema,-tenderness Neuro: AAO x4, CNII-XII grossly intact Psych: Normal mood, normal affect  Data Reviewed:  Imaging: No CT films available for review today. 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 Severely reduced DLCO  Labs: CBC    Component Value Date/Time    WBC 5.6 04/08/2018 2009   RBC 3.71 (L) 04/08/2018 2009   HGB 12.9 (L) 04/08/2018 2009   HCT 38.5 (L) 04/08/2018 2009   PLT 100 (L) 04/08/2018 2009   MCV 103.8 (H) 04/08/2018 2009   MCH 34.8 (H) 04/08/2018 2009   MCHC 33.5 04/08/2018 2009   RDW 12.7 04/08/2018 2009   LYMPHSABS 2.3 04/08/2018 2009   MONOABS 0.5 04/08/2018 2009   EOSABS 0.4 04/08/2018 2009   BASOSABS 0.0 04/08/2018 2009      Latest Ref Rng & Units 04/08/2018    8:10 PM 04/08/2018    8:09 PM 12/14/2017    5:26 PM  BMP  Glucose 70 - 99 mg/dL  141  454   BUN 6 - 20 mg/dL  16  15   Creatinine 0.61 - 1.24 mg/dL 1.00  1.07  1.11   Sodium 135 - 145 mmol/L  137  132   Potassium 3.5 - 5.1 mmol/L  3.6  4.1   Chloride 98 - 111 mmol/L  106  100   CO2 22 - 32 mmol/L  24  24   Calcium 8.9 - 10.3 mg/dL  9.0  9.2       Latest Ref Rng & Units 04/08/2018    8:09 PM 12/14/2017    5:26 PM 07/09/2015    5:02 PM  Hepatic Function  Total Protein 6.5 - 8.1 g/dL 7.3  8.4  7.3   Albumin 3.5 - 5.0 g/dL 3.3  3.7  3.4   AST 15 - 41 U/L 35  36  30   ALT 0 - 44 U/L 26  32  27   Alk Phosphatase 38 - 126 U/L 107  134  110   Total Bilirubin 0.3 - 1.2 mg/dL 0.5  1.0  0.8    Abs eos 04/08/18  ANA, Sjogren, Scl-70, ccp - neg C-ANCA 1:40 HP panel  neg Myomarker - pending    Assessment & Plan:   Discussion: 61 year old male with ILD, hx stroke, SAH, DM2 who presents for acute visit. Reviewed labs and PFTs. 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 Will also start to ICS/LABA as he has perceived benefit from SABA. Extensively discussed work-up and future medical intervention with pulmonary rehab, anti-fibrotics and oxygen if needed. Patient is agreeable to pursue plan as noted below   --START Symbicort 160-4.5 mcg TWO puffs in morning and evening. Rinse out after use --CONTINUE Albuterol AS NEEDED for shortness of breath or wheezing  Labs: CBC with diff, CMET, BNP Send message to  pharmacy for antifibrotic initiation (nintedanib and pirfenidone) Echocardiogram ordered REFER to pulmonary rehab REFER to [facility] for transplant evaluation Will schedule with Dr. Chase Caller in IPF clinic for possible clinical trials  Health Maintenance Immunization History  Administered Date(s) Administered   Influenza Whole 11/05/2021   Influenza,inj,Quad PF,6+ Mos 10/13/2012   CT Lung Screen - never smoker. Not qualified  Orders  Placed This Encounter  Procedures   CT Chest High Resolution    Standing Status:   Future    Standing Expiration Date:   01/05/2023    Order Specific Question:   Preferred imaging location?    Answer:   [facility]   B Nat Peptide   Comprehensive metabolic panel   CBC With Differential   AMB referral to pulmonary rehabilitation    Referral Priority:   Urgent    Referral Type:   Consultation    Number of Visits Requested:   1   Ambulatory referral to Pulmonology    Referral Priority:   Urgent    Referral Type:   Consultation    Referral Reason:   Specialty Services Required    Requested Specialty:   Pulmonary Disease    Number of Visits Requested:   1   Meds ordered this encounter  Medications   budesonide-formoterol (SYMBICORT) 160-4.5 MCG/ACT inhaler    Sig: Inhale 2 puffs into the lungs 2 (two) times daily.    Dispense:  1 each    Refill:  5    Return in about 1 month (around 02/04/2022). With Dr. Gala Murdoch  I have spent a total time of 60-minutes on the day of the appointment reviewing prior documentation, coordinating care and discussing medical diagnosis and plan with the patient/family. Imaging, labs and tests included in this note have been reviewed and interpreted independently by me.  [place], MD [facility] 01/04/2022 12:35 PM  Office Number [phone]

## 2022-01-05 LAB — CBC WITH DIFFERENTIAL
Basophils Absolute: 0 10*3/uL (ref 0.0–0.2)
Basos: 1 %
EOS (ABSOLUTE): 0.3 10*3/uL (ref 0.0–0.4)
Eos: 7 %
Hematocrit: 32.4 % — ABNORMAL LOW (ref 37.5–51.0)
Hemoglobin: 11.2 g/dL — ABNORMAL LOW (ref 13.0–17.7)
Immature Grans (Abs): 0 10*3/uL (ref 0.0–0.1)
Immature Granulocytes: 0 %
Lymphocytes Absolute: 1.3 10*3/uL (ref 0.7–3.1)
Lymphs: 26 %
MCH: 38.1 pg — ABNORMAL HIGH (ref 26.6–33.0)
MCHC: 34.6 g/dL (ref 31.5–35.7)
MCV: 110 fL — ABNORMAL HIGH (ref 79–97)
Monocytes Absolute: 0.4 10*3/uL (ref 0.1–0.9)
Monocytes: 8 %
Neutrophils Absolute: 3.1 10*3/uL (ref 1.4–7.0)
Neutrophils: 58 %
RBC: 2.94 x10E6/uL — ABNORMAL LOW (ref 4.14–5.80)
RDW: 12.7 % (ref 11.6–15.4)
WBC: 5.2 10*3/uL (ref 3.4–10.8)

## 2022-01-05 LAB — COMPREHENSIVE METABOLIC PANEL
ALT: 39 IU/L (ref 0–44)
AST: 57 IU/L — ABNORMAL HIGH (ref 0–40)
Albumin/Globulin Ratio: 0.5 — ABNORMAL LOW (ref 1.2–2.2)
Albumin: 2.7 g/dL — ABNORMAL LOW (ref 3.8–4.9)
Alkaline Phosphatase: 232 IU/L — ABNORMAL HIGH (ref 44–121)
BUN/Creatinine Ratio: 16 (ref 9–20)
BUN: 18 mg/dL (ref 6–24)
Bilirubin Total: 0.9 mg/dL (ref 0.0–1.2)
CO2: 20 mmol/L (ref 20–29)
Calcium: 7.9 mg/dL — ABNORMAL LOW (ref 8.7–10.2)
Chloride: 109 mmol/L — ABNORMAL HIGH (ref 96–106)
Creatinine, Ser: 1.15 mg/dL (ref 0.76–1.27)
Globulin, Total: 5 g/dL — ABNORMAL HIGH (ref 1.5–4.5)
Glucose: 103 mg/dL — ABNORMAL HIGH (ref 70–99)
Potassium: 3.6 mmol/L (ref 3.5–5.2)
Sodium: 140 mmol/L (ref 134–144)
Total Protein: 7.7 g/dL (ref 6.0–8.5)
eGFR: 73 mL/min/{1.73_m2} (ref >59)

## 2022-01-05 LAB — BRAIN NATRIURETIC PEPTIDE: BNP: 156.8 pg/mL — ABNORMAL HIGH (ref 0.0–100.0)

## 2022-01-07 ENCOUNTER — Other Ambulatory Visit (HOSPITAL_COMMUNITY): Payer: Self-pay

## 2022-01-07 ENCOUNTER — Telehealth: Payer: Self-pay | Admitting: Pulmonary Disease

## 2022-01-07 DIAGNOSIS — R748 Abnormal levels of other serum enzymes: Secondary | ICD-10-CM

## 2022-01-07 NOTE — Telephone Encounter (Signed)
Called [facility] to determine patient's insurance. Primary insurance (that they have been able to successfully process Symbicort rx through [employer] as primary and Medicaid as secondary:  [employer] Management: BIN: [id number] PCN [id number] Group: [id number] ID:  [id number]  Chesley Mires, PharmD, MPH, BCPS, CPP Clinical Pharmacist (Rheumatology and Pulmonology)

## 2022-01-07 NOTE — Telephone Encounter (Signed)
Pls call daughter Jaclyn Shaggy about abnormalities in her dads BW. [phone]

## 2022-01-08 NOTE — Telephone Encounter (Signed)
Spoke to daughter and I answered her questions about her father's abnormalities in his blood work that Dr Everardo All saw on 01/06/2022. [name] daughter verbalized understanding. Nothing further needed at this time.

## 2022-01-08 NOTE — Telephone Encounter (Signed)
Thank you for the update, Richard Davenport.  Standing orders for repeat CMET ordered for same day when patient is scheduled for CT scan. Daughter is aware.

## 2022-01-08 NOTE — Addendum Note (Signed)
Addended by: Mechele Collin on: 01/08/2022 12:33 PM   Modules accepted: Orders

## 2022-01-10 ENCOUNTER — Telehealth (HOSPITAL_COMMUNITY): Payer: Self-pay

## 2022-01-10 NOTE — Telephone Encounter (Addendum)
Received referral from Dr. Everardo All for this pt to participate in Pulmonary Rehab with the diagnosis of idiopathic pulmonary fibrosis (IPF). Clinical review of pt follow up appt on 01/04/22 Pulmonary office note. Pt appropriate for scheduling for Pulmonary rehab. Will forward to support staff for scheduling and verification of insurance eligibility/benefits with pt consent.   Essie Hart, RN, BSN Cardiac and Pulmonary Rehab

## 2022-01-11 NOTE — Telephone Encounter (Signed)
Submitted a Prior Authorization request to  [employer]  for PIRFENIDONE via FAX on CoverMyMeds. Will update once we receive a response.  Key: [id number]  Chesley Mires, PharmD, MPH, BCPS, CPP Clinical Pharmacist (Rheumatology and Pulmonology)

## 2022-01-14 LAB — HYPERSENSITIVITY PNEUMONITIS
A. Pullulans Abs: NEGATIVE
A.Fumigatus #1 Abs: NEGATIVE
Micropolyspora faeni, IgG: NEGATIVE
Pigeon Serum Abs: NEGATIVE
Thermoact. Saccharii: NEGATIVE
Thermoactinomyces vulgaris, IgG: NEGATIVE

## 2022-01-14 LAB — MYOMARKER 3 PLUS PROFILE (RDL)
Anti-EJ Ab (RDL): NEGATIVE
Anti-Jo-1 Ab (RDL): <20 Units (ref <20)
Anti-Ku Ab (RDL): NEGATIVE
Anti-MDA-5 Ab (CADM-140)(RDL): <20 Units (ref <20)
Anti-Mi-2 Ab (RDL): NEGATIVE
Anti-NXP-2 (P140) Ab (RDL): <20 Units (ref <20)
Anti-OJ Ab (RDL): NEGATIVE
Anti-PL-12 Ab (RDL: NEGATIVE
Anti-PL-7 Ab (RDL): POSITIVE — AB
Anti-PM/Scl-100 Ab (RDL): <20 Units (ref <20)
Anti-SAE1 Ab, IgG (RDL): <20 Units (ref <20)
Anti-SRP Ab (RDL): NEGATIVE
Anti-SS-A 52kD Ab, IgG (RDL): <20 Units (ref <20)
Anti-TIF-1gamma Ab (RDL): <20 Units (ref <20)
Anti-U1 RNP Ab (RDL): <20 Units (ref <20)
Anti-U2 RNP Ab (RDL): NEGATIVE
Anti-U3 RNP (Fibrillarin)(RDL): NEGATIVE

## 2022-01-22 ENCOUNTER — Other Ambulatory Visit (HOSPITAL_COMMUNITY): Payer: Self-pay

## 2022-01-22 ENCOUNTER — Telehealth (HOSPITAL_COMMUNITY): Payer: Self-pay

## 2022-01-22 NOTE — Telephone Encounter (Signed)
Pt insurance is active and benefits verified through [facility]. Co-pay $0.00, DED $2,000.00/$0.00 met, out of pocket $4,000.00/$0.00 met, co-insurance 30%. No pre-authorization required. Jay/[employer], 01/22/22 @ 11:34AM, OQH#[id number]

## 2022-01-22 NOTE — Telephone Encounter (Signed)
Received notification from  [employer]  regarding a prior authorization for PIRFENIDONE. Authorization has been APPROVED from 01/17/22 to 01/18/2023. Approval letter sent to scan center.  Unable to run test claim because of BIN # on [employer] plan. Will continue to try. Per Josem Kaufmann letter, rx needs to be sent to [facility] (pulmonary fibrosis team): [phone]  Authorization # [id number] Phone # [phone]  Knox Saliva, PharmD, MPH, BCPS, CPP Clinical Pharmacist (Rheumatology and Pulmonology)

## 2022-01-22 NOTE — Telephone Encounter (Signed)
Called patient to see if he was interested in participating in the Pulmonary Rehab Program. Patient stated yes. Patient will come in for orientation on 02/01/22 @ 9AM and will attend the 10:15AM exercise class.   Tourist information centre manager.

## 2022-01-23 ENCOUNTER — Telehealth: Payer: Self-pay | Admitting: Pharmacist

## 2022-01-23 ENCOUNTER — Other Ambulatory Visit (HOSPITAL_COMMUNITY): Payer: Self-pay

## 2022-01-23 DIAGNOSIS — J84112 Idiopathic pulmonary fibrosis: Secondary | ICD-10-CM

## 2022-01-23 DIAGNOSIS — Z79899 Other long term (current) drug therapy: Secondary | ICD-10-CM

## 2022-01-23 MED ORDER — PIRFENIDONE 267 MG PO TABS: 801.0000 mg | ORAL_TABLET | Freq: Three times a day (TID) | ORAL | 5 refills | Status: DC

## 2022-01-23 MED ORDER — PIRFENIDONE 267 MG PO TABS: ORAL_TABLET | ORAL | 0 refills | Status: DC

## 2022-01-23 NOTE — Telephone Encounter (Signed)
Called [facility] to determine if [facility] is required to be dispensing specialty [facility].  Per rep, patient must fill wth [facility] (pulmonary fibrosis team): [phone]  Called patient's daughter, Ihor Austin, and provided her with phone number to schedule shipment to home. Provided counseling over phone as well. This is documented in separate telephone enconuter.  Knox Saliva, PharmD, MPH, BCPS, CPP Clinical Pharmacist (Rheumatology and Pulmonology)

## 2022-01-23 NOTE — Telephone Encounter (Signed)
Subjective:  Patient's daughter Ihor Austin called today by [facility] Pulmonary pharmacy team for pirfenidone new start for IPF.   Patient was last seen by Dr. Loanne Drilling on 01/04/2022.  Pertinent past medical history includes history of cerebral aneurysm, TIA, T2DM, hyperlipidemia.  He traveled to [place] last year and had chest CT completed which showed evidence of bronchiectasis and honeycombing. He has had cough for several years now. Daughter stated that Symbicort that was provided by Dr. Loanne Drilling has significantly helped with his breathing.  Patient's daughter states that he spends most of his day walking around at work.  He is scheduled for Pulmonary rehab training on 02/01/2022  History of elevated LFTs: Yes - will need rechecked on 02/18/2022 at appt with Dr. Ander Slade History of diarrhea, nausea, vomiting: No  Objective: Allergies  Allergen Reactions   Pork-Derived Products Other (See Comments)    Patient is Muslim and PREFERS TO NOT TAKE ANY PORK OR MEAT PRODUCTS (only fish)    Outpatient Encounter Medications as of 01/23/2022  Medication Sig   Pirfenidone 267 MG TABS Month 1: Take 1 tab three times daily for 7 days, then 2 tabs three times daily for 7 days, then 3 tabs three times daily thereafter.   [START ON 02/21/2022] Pirfenidone 267 MG TABS Take 3 tablets (801 mg total) by mouth with breakfast, with lunch, and with evening meal. Month 2 and onwards   aspirin EC 81 MG EC tablet Take 1 tablet (81 mg total) by mouth daily. (Patient not taking: Reported on 12/26/2021)   benzonatate (TESSALON) 200 MG capsule Take 1 capsule (200 mg total) by mouth 3 (three) times daily as needed for cough.   budesonide-formoterol (SYMBICORT) 160-4.5 MCG/ACT inhaler Inhale 2 puffs into the lungs 2 (two) times daily.   clopidogrel (PLAVIX) 75 MG tablet Take 1 tablet (75 mg total) by mouth daily. Only take for 21 days. Take with aspirin and Continue aspirin once you stop plavix (Patient not taking: Reported on  12/26/2021)   metFORMIN (GLUCOPHAGE) 500 MG tablet Take 1 tablet (500 mg total) by mouth 2 (two) times daily.   No facility-administered encounter medications on file as of 01/23/2022.     Immunization History  Administered Date(s) Administered   Influenza Whole 11/05/2021   Influenza,inj,Quad PF,6+ Mos 10/13/2012      PFT's No results found for: "FEV1", "FVC", "FEV1FVC", "TLC", "DLCO"    CMP     Component Value Date/Time   NA 140 01/04/2022 1159   K 3.6 01/04/2022 1159   CL 109 (H) 01/04/2022 1159   CO2 20 01/04/2022 1159   GLUCOSE 103 (H) 01/04/2022 1159   GLUCOSE 141 (H) 04/08/2018 2009   BUN 18 01/04/2022 1159   CREATININE 1.15 01/04/2022 1159   CALCIUM 7.9 (L) 01/04/2022 1159   PROT 7.7 01/04/2022 1159   ALBUMIN 2.7 (L) 01/04/2022 1159   AST 57 (H) 01/04/2022 1159   ALT 39 01/04/2022 1159   ALKPHOS 232 (H) 01/04/2022 1159   BILITOT 0.9 01/04/2022 1159   GFRNONAA >60 04/08/2018 2009   GFRAA >60 04/08/2018 2009      CBC    Component Value Date/Time   WBC 5.2 01/04/2022 1159   WBC 5.6 04/08/2018 2009   RBC 2.94 (L) 01/04/2022 1159   RBC 3.71 (L) 04/08/2018 2009   HGB 11.2 (L) 01/04/2022 1159   HCT 32.4 (L) 01/04/2022 1159   PLT 100 (L) 04/08/2018 2009   MCV 110 (H) 01/04/2022 1159   MCH 38.1 (H) 01/04/2022 1159  MCH 34.8 (H) 04/08/2018 2009   MCHC 34.6 01/04/2022 1159   MCHC 33.5 04/08/2018 2009   RDW 12.7 01/04/2022 1159   LYMPHSABS 1.3 01/04/2022 1159   MONOABS 0.5 04/08/2018 2009   EOSABS 0.3 01/04/2022 1159   BASOSABS 0.0 01/04/2022 1159    LFT's    Latest Ref Rng & Units 01/04/2022   11:59 AM 04/08/2018    8:09 PM 12/14/2017    5:26 PM  Hepatic Function  Total Protein 6.0 - 8.5 g/dL 7.7  7.3  8.4   Albumin 3.8 - 4.9 g/dL 2.7  3.3  3.7   AST 0 - 40 IU/L 57  35  36   ALT 0 - 44 IU/L 39  26  32   Alk Phosphatase 44 - 121 IU/L 232  107  134   Total Bilirubin 0.0 - 1.2 mg/dL 0.9  0.5  1.0     HRCT - scheduled for 01/25/2022  Assessment and  Plan  [facility] Medication Management Thoroughly counseled patient on the efficacy, mechanism of action, dosing, administration, adverse effects, and monitoring parameters of pirfenidone.  Patient verbalized understanding.   Goals of Therapy: Will not stop or reverse the progression of ILD. It will slow the progression of ILD.  Reviewed that IPF is managed through antifibrotics, pulmonary rehab, and symptom management.  Dosing: Starting dose will be pirfenidone 267 mg 1 tablet three times daily for 7 days, then 2 tablets three times daily for 7 days, then 3 tablets three times daily.  Maintenance dose will be pirfenidone  801 mg 1 tablet three times daily if tolerated.  Stressed the importance of taking with meals and space at least 5-6 hours apart to minimize stomach upset.   Adverse Effects: Nausea, vomiting, diarrhea, weight loss Abdominal pain GERD - patient has GERD at baseline that is well-controlled Sun sensitivity/rash - patient advised to wear sunscreen when exposed to sunlight Dizziness - reviewed risk for falls especially as he is mobile at work Fatigue  Monitoring: Monitor for diarrhea, nausea and vomiting, GI perforation, hepatotoxicity  Monitor LFTs - baseline, monthly for first 6 months, then every 3 months routinely  Access: Approval of pirfenidone through: insurance (has [employer] as primary and Medicaid as secondary) Rx sent to: [facility] (pulmonary fibrosis team): [phone] She will plan to call on Friday at hte earliest to coordinate shipment. I provided her with [employer] billing information as we were not able to automatically pull that through into our system and receiving pharmacy may also have issues  Medication Reconciliation A drug regimen assessment was performed, including review of allergies, interactions, disease-state management, dosing and immunization history. Medications were reviewed with the patient, including name, instructions, indication,  goals of therapy, potential side effects, importance of adherence, and safe use. No interactions noted.  Thank you for involving pharmacy to assist in providing this patient's care.   Knox Saliva, PharmD, MPH, BCPS, CPP Clinical Pharmacist (Rheumatology and Pulmonology)

## 2022-01-24 ENCOUNTER — Ambulatory Visit (HOSPITAL_COMMUNITY)
Admission: RE | Admit: 2022-01-24 | Discharge: 2022-01-24 | Disposition: A | Discharge status: Home / Self Care | Payer: Commercial Managed Care - PPO | Source: Ambulatory Visit | Attending: Pulmonary Disease | Admitting: Pulmonary Disease

## 2022-01-24 DIAGNOSIS — E785 Hyperlipidemia, unspecified: Secondary | ICD-10-CM | POA: Diagnosis not present

## 2022-01-24 DIAGNOSIS — R011 Cardiac murmur, unspecified: Secondary | ICD-10-CM | POA: Insufficient documentation

## 2022-01-24 DIAGNOSIS — R42 Dizziness and giddiness: Secondary | ICD-10-CM | POA: Insufficient documentation

## 2022-01-24 DIAGNOSIS — J849 Interstitial pulmonary disease, unspecified: Secondary | ICD-10-CM | POA: Diagnosis not present

## 2022-01-24 DIAGNOSIS — R4182 Altered mental status, unspecified: Secondary | ICD-10-CM | POA: Diagnosis not present

## 2022-01-24 LAB — ECHOCARDIOGRAM COMPLETE
Area-P 1/2: 3.37 cm2
Calc EF: 62.6 %
S' Lateral: 3.2 cm
Single Plane A2C EF: 63.8 %
Single Plane A4C EF: 63.6 %

## 2022-01-24 NOTE — Progress Notes (Signed)
  Echocardiogram 2D Echocardiogram has been performed.  Richard Davenport 01/24/2022, 10:43 AM

## 2022-01-25 ENCOUNTER — Ambulatory Visit (HOSPITAL_BASED_OUTPATIENT_CLINIC_OR_DEPARTMENT_OTHER)
Admission: RE | Admit: 2022-01-25 | Discharge: 2022-01-25 | Disposition: A | Discharge status: Home / Self Care | Payer: Commercial Managed Care - PPO | Source: Ambulatory Visit | Attending: Pulmonary Disease | Admitting: Pulmonary Disease

## 2022-01-25 DIAGNOSIS — J84112 Idiopathic pulmonary fibrosis: Secondary | ICD-10-CM | POA: Insufficient documentation

## 2022-01-31 ENCOUNTER — Telehealth (HOSPITAL_COMMUNITY): Payer: Self-pay

## 2022-01-31 NOTE — Telephone Encounter (Signed)
Pt daug. Maleeha called and stated pt would like to reschedule PR. Pt will come in for orientation on 02/18/22 @ 1PM and will attend the 10:15 class.

## 2022-02-01 ENCOUNTER — Other Ambulatory Visit (HOSPITAL_COMMUNITY): Payer: Self-pay

## 2022-02-01 ENCOUNTER — Ambulatory Visit (HOSPITAL_COMMUNITY): Payer: Commercial Managed Care - PPO

## 2022-02-06 NOTE — Telephone Encounter (Signed)
Called [facility] to determine status of pirfenidone. Per rep, patient's daughter was contacted on 01/30/2022 regarding completing a coordination of benefits form. Rep states primary insurance was not provided. I provided to the rep today. He states he will send information to insurance department. The rep from insurance department that was managing is OOO currently  Knox Saliva, PharmD, MPH, BCPS, CPP Clinical Pharmacist (Rheumatology and Pulmonology)

## 2022-02-07 ENCOUNTER — Ambulatory Visit (HOSPITAL_COMMUNITY): Payer: Commercial Managed Care - PPO

## 2022-02-08 ENCOUNTER — Telehealth: Payer: Self-pay | Admitting: Pulmonary Disease

## 2022-02-08 NOTE — Telephone Encounter (Signed)
Pharm calling to see if we got the prior auth they sent to [place] yesterday, 01/08/23. Please call to advise   [phone] Pankti R.  Drug: 267 mg tabs Pirfenidone

## 2022-02-08 NOTE — Telephone Encounter (Signed)
Devki can you please advise? Have you received a prior auth for patients Pirfenidone

## 2022-02-12 ENCOUNTER — Ambulatory Visit (HOSPITAL_COMMUNITY): Payer: Commercial Managed Care - PPO

## 2022-02-12 NOTE — Telephone Encounter (Signed)
Called [employer] regarding pirfenidone status as PA has been APPROVED on 01/18/2022 through 01/18/2023. I confirmed again with [employer] this information and she is stating that override needs to be placed.  Conference called pharmacy with insurance rep and pharmacy is adamant that PA is needed. Insurance rep states she will contact her team and figure out why claim is rejecting and then reach back out to the pharmacy with details on override that needs to be placed ([facility]: [phone])  [employer] phone: Phone # [phone]  Pharmacy team will need to continue to f/u  Knox Saliva, PharmD, MPH, BCPS, CPP Clinical Pharmacist (Rheumatology and Pulmonology)

## 2022-02-12 NOTE — Telephone Encounter (Signed)
[facility] calling regarding pre-auth status. No reply from [facility] when messaged.  Please call @ [phone]  Anyone can help.

## 2022-02-14 ENCOUNTER — Ambulatory Visit (HOSPITAL_COMMUNITY): Payer: Commercial Managed Care - PPO

## 2022-02-18 ENCOUNTER — Ambulatory Visit (INDEPENDENT_AMBULATORY_CARE_PROVIDER_SITE_OTHER): Payer: Commercial Managed Care - PPO | Admitting: Pulmonary Disease

## 2022-02-18 ENCOUNTER — Encounter (HOSPITAL_COMMUNITY): Payer: Self-pay

## 2022-02-18 ENCOUNTER — Encounter: Payer: Self-pay | Admitting: Pulmonary Disease

## 2022-02-18 ENCOUNTER — Encounter (HOSPITAL_COMMUNITY)
Admission: RE | Admit: 2022-02-18 | Discharge: 2022-02-18 | Disposition: A | Discharge status: Home / Self Care | Payer: Commercial Managed Care - PPO | Source: Ambulatory Visit | Attending: Pulmonary Disease | Admitting: Pulmonary Disease

## 2022-02-18 VITALS — BP 124/72 | HR 77 | Ht 68.0 in | Wt 175.5 lb

## 2022-02-18 VITALS — BP 130/76 | HR 72 | Ht 68.0 in | Wt 175.0 lb

## 2022-02-18 DIAGNOSIS — J84112 Idiopathic pulmonary fibrosis: Secondary | ICD-10-CM | POA: Insufficient documentation

## 2022-02-18 DIAGNOSIS — J8410 Pulmonary fibrosis, unspecified: Secondary | ICD-10-CM | POA: Insufficient documentation

## 2022-02-18 DIAGNOSIS — R7989 Other specified abnormal findings of blood chemistry: Secondary | ICD-10-CM

## 2022-02-18 HISTORY — DX: Type 2 diabetes mellitus without complications: E11.9

## 2022-02-18 HISTORY — DX: Idiopathic pulmonary fibrosis: J84.112

## 2022-02-18 LAB — HEPATIC FUNCTION PANEL
ALT: 30 U/L (ref 0–53)
AST: 42 U/L — ABNORMAL HIGH (ref 0–37)
Albumin: 2.5 g/dL — ABNORMAL LOW (ref 3.5–5.2)
Alkaline Phosphatase: 174 U/L — ABNORMAL HIGH (ref 39–117)
Bilirubin, Direct: 0.2 mg/dL (ref 0.0–0.3)
Total Bilirubin: 0.8 mg/dL (ref 0.2–1.2)
Total Protein: 7.8 g/dL (ref 6.0–8.3)

## 2022-02-18 LAB — GLUCOSE, CAPILLARY: Glucose-Capillary: 135 mg/dL — ABNORMAL HIGH (ref 70–99)

## 2022-02-18 MED ORDER — ALBUTEROL SULFATE HFA 108 (90 BASE) MCG/ACT IN AERS: 2.0000 | INHALATION_SPRAY | Freq: Four times a day (QID) | RESPIRATORY_TRACT | 6 refills | Status: DC | PRN

## 2022-02-18 NOTE — Patient Instructions (Signed)
Prescription for albuterol be sent to pharmacy  Ensure you are using Symbicort correctly all the time  Continue graded activities as tolerated  Obtain LFTs  Schedule next appointment with Dr. Chase Caller in 4 to 8 weeks-to establish care-interstitial lung disease  Prescription for albuterol to be sent to pharmacy  Start process for disability and they will contact us for records  Call with significant concerns  Pain management for back pain  Initiate antifibrotic once received -Will need close lab monitoring -Monthly LFTs, this can be put in place once he starts medications

## 2022-02-18 NOTE — Progress Notes (Addendum)
Pulmonary Individual Treatment Plan  Patient Details  Name: Richard Davenport MRN: [id number] Date of Birth: 05-19-1960 Referring Provider:   Doristine DevoidFlowsheet Row Pulmonary Rehab Walk Test [facility]  Referring Provider Olalere       Initial Encounter Date:  Flowsheet Row Pulmonary Rehab Walk [facility]  Date 02/18/22       Visit Diagnosis: IPF (idiopathic pulmonary fibrosis) (HCC)  Patient's Home Medications on Admission:   Current Outpatient Medications:    albuterol (VENTOLIN HFA) 108 (90 Base) MCG/ACT inhaler, Inhale 2 puffs into the lungs every 6 (six) hours as needed for wheezing or shortness of breath., Disp: 8 g, Rfl: 6   benzonatate (TESSALON) 200 MG capsule, Take 1 capsule (200 mg total) by mouth 3 (three) times daily as needed for cough., Disp: 90 capsule, Rfl: 1   budesonide-formoterol (SYMBICORT) 160-4.5 MCG/ACT inhaler, Inhale 2 puffs into the lungs 2 (two) times daily., Disp: 1 each, Rfl: 5   metFORMIN (GLUCOPHAGE) 500 MG tablet, Take 1 tablet (500 mg total) by mouth 2 (two) times daily., Disp: 60 tablet, Rfl: 1   aspirin EC 81 MG EC tablet, Take 1 tablet (81 mg total) by mouth daily. (Patient not taking: Reported on 12/26/2021), Disp: , Rfl:    Pirfenidone 267 MG TABS, Month 1: Take 1 tab three times daily for 7 days, then 2 tabs three times daily for 7 days, then 3 tabs three times daily thereafter. (Patient not taking: Reported on 02/18/2022), Disp: 270 tablet, Rfl: 0   [START ON 02/21/2022] Pirfenidone 267 MG TABS, Take 3 tablets (801 mg total) by mouth with breakfast, with lunch, and with evening meal. Month 2 and onwards (Patient not taking: Reported on 02/18/2022), Disp: 270 tablet, Rfl: 5  Past Medical History: Past Medical History:  Diagnosis Date   Diabetes mellitus without complication (HCC)    Headache     Idiopathic pulmonary fibrosis (HCC)    Stroke (HCC)    Subarachnoid hemorrhage (HCC)     Tobacco Use: Social History   Tobacco Use  Smoking Status Never  Smokeless Tobacco Never    Labs: Review Flowsheet  More data exists      Latest Ref Rng & Units 10/09/2012 08/26/2013 08/27/2013 12/14/2017 04/09/2018  Labs for ITP Cardiac and Pulmonary Rehab  Cholestrol 0 - 200 mg/dL - - 409153  - [id number]   LDL (calc) 0 - 99 mg/dL - - 37  - 72   HDL-C >91>40 mg/dL - - 44  - 48   Trlycerides <150 mg/dL - - 478361  - 96   Hemoglobin A1c 4.8 - 5.6 % - 5.8  5.7  12.0  5.9   PH, Arterial 7.350 - 7.450 7.386  7.353  - - - -  PCO2 arterial 35.0 - 45.0 mmHg 37.6  39.8  - - - -  Bicarbonate 20.0 - 28.0 mmol/L 22.6  22.1  - - 26.7  -  TCO2 22 - 32 mmol/L 24  23  23   - 28  -  Acid-base deficit 0.0 - 2.0 mmol/L 2.0  3.0  - - - -  O2 Saturation % 98.0  99.0  - - 76.0  -    Capillary Blood Glucose: Lab Results  Component Value Date   GLUCAP 135 (H) 02/18/2022   GLUCAP 141 (H) 04/10/2018   GLUCAP 105 (H) 04/10/2018   GLUCAP  137 (H) 04/09/2018   GLUCAP 144 (H) 04/09/2018    POCT Glucose     Row Name 02/18/22 1351             POCT Blood Glucose   Pre-Exercise 135 mg/dL                Pulmonary Assessment Scores:  Pulmonary Assessment Scores     Row Name 02/18/22 1325         ADL UCSD   SOB Score total 17       CAT Score   CAT Score 9       mMRC Score   mMRC Score 2             UCSD: Self-administered rating of dyspnea associated with activities of daily living (ADLs) 6-point scale (0 = "not at all" to 5 = "maximal or unable to do because of breathlessness")  Scoring Scores range from 0 to 120.  Minimally important difference is 5 units  CAT: CAT can identify the health impairment of COPD patients and is better correlated with disease progression.  CAT has a scoring range of zero to 40. The CAT score is classified into four groups of low (less than 10), medium (10 - 20), high  (21-30) and very high (31-40) based on the impact level of disease on health status. A CAT score over 10 suggests significant symptoms.  A worsening CAT score could be explained by an exacerbation, poor medication adherence, poor inhaler technique, or progression of COPD or comorbid conditions.  CAT MCID is 2 points  mMRC: mMRC (Modified Medical Research Council) Dyspnea Scale is used to assess the degree of baseline functional disability in patients of respiratory disease due to dyspnea. No minimal important difference is established. A decrease in score of 1 point or greater is considered a positive change.   Pulmonary Function Assessment:  Pulmonary Function Assessment - 02/18/22 1337       Breath   Bilateral Breath Sounds Decreased    Shortness of Breath No             Exercise Target Goals: Exercise Program Goal: Individual exercise prescription set using results from initial 6 min walk test and THRR while considering  patient's activity barriers and safety.   Exercise Prescription Goal: Initial exercise prescription builds to 30-45 minutes a day of aerobic activity, 2-3 days per week.  Home exercise guidelines will be given to patient during program as part of exercise prescription that the participant will acknowledge.  Activity Barriers & Risk Stratification:  Activity Barriers & Cardiac Risk Stratification - 02/18/22 1322       Activity Barriers & Cardiac Risk Stratification   Activity Barriers Deconditioning;Muscular Weakness;Shortness of Breath             6 Minute Walk:  6 Minute Walk     Row Name 02/18/22 1419         6 Minute Walk   Phase Initial     Distance 1426 feet     Walk Time 6 minutes     # of Rest Breaks 0     MPH 2.7     METS 3.88     RPE 9     Perceived Dyspnea  0     VO2 Peak 13.6     Symptoms No     Resting HR 77 bpm     Resting BP 124/72     Resting Oxygen Saturation  97 %  Exercise Oxygen Saturation  during 6 min walk 89 %      Max Ex. HR 103 bpm     Max Ex. BP 140/78     2 Minute Post BP 140/80       Interval HR   1 Minute HR 93     2 Minute HR 100     3 Minute HR 103     4 Minute HR 101     5 Minute HR 103     6 Minute HR 100     2 Minute Post HR 76     Interval Heart Rate? Yes       Interval Oxygen   Interval Oxygen? Yes     Baseline Oxygen Saturation % 97 %     1 Minute Oxygen Saturation % 93 %     1 Minute Liters of Oxygen 0 L     2 Minute Oxygen Saturation % 90 %     2 Minute Liters of Oxygen 0 L     3 Minute Oxygen Saturation % 91 %     3 Minute Liters of Oxygen 0 L     4 Minute Oxygen Saturation % 90 %     4 Minute Liters of Oxygen 0 L     5 Minute Oxygen Saturation % 89 %     5 Minute Liters of Oxygen 0 L     6 Minute Oxygen Saturation % 90 %     6 Minute Liters of Oxygen 0 L     2 Minute Post Oxygen Saturation % 97 %     2 Minute Post Liters of Oxygen 0 L              Oxygen Initial Assessment:  Oxygen Initial Assessment - 02/18/22 1353       Home Oxygen   Home Oxygen Device None    Sleep Oxygen Prescription None    Home Exercise Oxygen Prescription None    Home Resting Oxygen Prescription None      Initial 6 min Walk   Oxygen Used None      Program Oxygen Prescription   Program Oxygen Prescription None      Intervention   Short Term Goals To learn and understand importance of maintaining oxygen saturations>88%;To learn and demonstrate proper use of respiratory medications;To learn and demonstrate proper pursed lip breathing techniques or other breathing techniques. ;To learn and understand importance of monitoring SPO2 with pulse oximeter and demonstrate accurate use of the pulse oximeter.    Long  Term Goals Maintenance of O2 saturations>88%;Compliance with respiratory medication;Exhibits proper breathing techniques, such as pursed lip breathing or other method taught during program session;Verbalizes importance of monitoring SPO2 with pulse oximeter and return  demonstration             Oxygen Re-Evaluation:   Oxygen Discharge (Final Oxygen Re-Evaluation):   Initial Exercise Prescription:  Initial Exercise Prescription - 02/18/22 1400       Date of Initial Exercise RX and Referring Provider   Date 02/18/22    Referring Provider Olalere    Expected Discharge Date 04/25/22      Elliptical   Level 2    Speed 70    Minutes 15    METs 3      Rower   Level 2    Minutes 15    METs 3      Prescription Details   Frequency (times per week) 2  Duration Progress to 30 minutes of continuous aerobic without signs/symptoms of physical distress      Intensity   THRR 40-80% of Max Heartrate 64-129    Ratings of Perceived Exertion 11-13    Perceived Dyspnea 0-4      Progression   Progression Continue to progress workloads to maintain intensity without signs/symptoms of physical distress.      Resistance Training   Training Prescription Yes    Weight blue bands    Reps 10-15             Perform Capillary Blood Glucose checks as needed.  Exercise Prescription Changes:   Exercise Comments:   Exercise Goals and Review:   Exercise Goals     Row Name 02/18/22 1321             Exercise Goals   Increase Physical Activity Yes       Intervention Provide advice, education, support and counseling about physical activity/exercise needs.;Develop an individualized exercise prescription for aerobic and resistive training based on initial evaluation findings, risk stratification, comorbidities and participant's personal goals.       Expected Outcomes Short Term: Attend rehab on a regular basis to increase amount of physical activity.;Long Term: Exercising regularly at least 3-5 days a week.;Long Term: Add in home exercise to make exercise part of routine and to increase amount of physical activity.       Increase Strength and Stamina Yes       Intervention Provide advice, education, support and counseling about physical  activity/exercise needs.;Develop an individualized exercise prescription for aerobic and resistive training based on initial evaluation findings, risk stratification, comorbidities and participant's personal goals.       Expected Outcomes Short Term: Increase workloads from initial exercise prescription for resistance, speed, and METs.;Short Term: Perform resistance training exercises routinely during rehab and add in resistance training at home;Long Term: Improve cardiorespiratory fitness, muscular endurance and strength as measured by increased METs and functional capacity ( )       Able to understand and use rate of perceived exertion (RPE) scale Yes       Intervention Provide education and explanation on how to use RPE scale       Expected Outcomes Short Term: Able to use RPE daily in rehab to express subjective intensity level;Long Term:  Able to use RPE to guide intensity level when exercising independently       Able to understand and use Dyspnea scale Yes       Intervention Provide education and explanation on how to use Dyspnea scale       Expected Outcomes Short Term: Able to use Dyspnea scale daily in rehab to express subjective sense of shortness of breath during exertion;Long Term: Able to use Dyspnea scale to guide intensity level when exercising independently       Knowledge and understanding of Target Heart Rate Range (THRR) Yes       Intervention Provide education and explanation of THRR including how the numbers were predicted and where they are located for reference       Expected Outcomes Short Term: Able to state/look up THRR;Short Term: Able to use daily as guideline for intensity in rehab;Long Term: Able to use THRR to govern intensity when exercising independently       Understanding of Exercise Prescription Yes       Intervention Provide education, explanation, and written materials on patient's individual exercise prescription       Expected Outcomes Short Term:  Able to  explain program exercise prescription;Long Term: Able to explain home exercise prescription to exercise independently                Exercise Goals Re-Evaluation :   Discharge Exercise Prescription (Final Exercise Prescription Changes):   Nutrition:  Target Goals: Understanding of nutrition guidelines, daily intake of sodium 1500mg , cholesterol 200mg , calories 30% from fat and 7% or less from saturated fats, daily to have 5 or more servings of fruits and vegetables.  Biometrics:  Pre Biometrics - 02/18/22 1422       Pre Biometrics   Grip Strength 41 kg              Nutrition Therapy Plan and Nutrition Goals:   Nutrition Assessments:  MEDIFICTS Score Key: ?70 Need to make dietary changes  40-70 Heart Healthy Diet ? 40 Therapeutic Level Cholesterol Diet   Picture Your Plate Scores: <34 Unhealthy dietary pattern with much room for improvement. 41-50 Dietary pattern unlikely to meet recommendations for good health and room for improvement. 51-60 More healthful dietary pattern, with some room for improvement.  >60 Healthy dietary pattern, although there may be some specific behaviors that could be improved.    Nutrition Goals Re-Evaluation:   Nutrition Goals Discharge (Final Nutrition Goals Re-Evaluation):   Psychosocial: Target Goals: Acknowledge presence or absence of significant depression and/or stress, maximize coping skills, provide positive support system. Participant is able to verbalize types and ability to use techniques and skills needed for reducing stress and depression.  Initial Review & Psychosocial Screening:  Initial Psych Review & Screening - 02/18/22 1322       Initial Review   Current issues with None Identified      Family Dynamics   Good Support System? Yes    Comments Married with 4 supportive children      Barriers   Psychosocial barriers to participate in program There are no identifiable barriers or psychosocial needs.       Screening Interventions   Interventions Encouraged to exercise    Expected Outcomes Long Term Goal: Stressors or current issues are controlled or eliminated.             Quality of Life Scores:  Scores of 19 and below usually indicate a poorer quality of life in these areas.  A difference of  2-3 points is a clinically meaningful difference.  A difference of 2-3 points in the total score of the Quality of Life Index has been associated with significant improvement in overall quality of life, self-image, physical symptoms, and general health in studies assessing change in quality of life.  PHQ-9: Review Flowsheet       02/18/2022  Depression screen PHQ 2/9  Decreased Interest 0  Down, Depressed, Hopeless 0  PHQ - 2 Score 0  Altered sleeping 0  Tired, decreased energy 1  Change in appetite 0  Feeling bad or failure about yourself  0  Trouble concentrating 0  Moving slowly or fidgety/restless 0  Suicidal thoughts 0  PHQ-9 Score 1  Difficult doing work/chores Not difficult at all   Interpretation of Total Score  Total Score Depression Severity:  1-4 = Minimal depression, 5-9 = Mild depression, 10-14 = Moderate depression, 15-19 = Moderately severe depression, 20-27 = Severe depression   Psychosocial Evaluation and Intervention:  Psychosocial Evaluation - 02/18/22 1331       Psychosocial Evaluation & Interventions   Interventions Encouraged to exercise with the program and follow exercise prescription  Comments Richard Davenport denies any psychosocial barriers at this time. We will continue to monitor and assess for any needs.    Expected Outcomes For Richard Davenport to participate in the PR program without any barriers    Continue Psychosocial Services  No Follow up required             Psychosocial Re-Evaluation:   Psychosocial Discharge (Final Psychosocial Re-Evaluation):   Education: Education Goals: Education classes will be provided on a weekly basis, covering required  topics. Participant will state understanding/return demonstration of topics presented.  Learning Barriers/Preferences:  Learning Barriers/Preferences - 02/18/22 1332       Learning Barriers/Preferences   Learning Barriers Sight   cataract right eye   Learning Preferences Group Instruction;Video             Education Topics: Introduction to Pulmonary Rehab Group instruction provided by PowerPoint, verbal discussion, and written material to support subject matter. Instructor reviews what Pulmonary Rehab is, the purpose of the program, and how patients are referred.     Know Your Numbers Group instruction that is supported by a PowerPoint presentation. Instructor discusses importance of knowing and understanding resting, exercise, and post-exercise oxygen saturation, heart rate, and blood pressure. Oxygen saturation, heart rate, blood pressure, rating of perceived exertion, and dyspnea are reviewed along with a normal range for these values.    Exercise for the Pulmonary Patient Group instruction that is supported by a PowerPoint presentation. Instructor discusses benefits of exercise, core components of exercise, frequency, duration, and intensity of an exercise routine, importance of utilizing pulse oximetry during exercise, safety while exercising, and options of places to exercise outside of rehab.    MET Level  Group instruction provided by PowerPoint, verbal discussion, and written material to support subject matter. Instructor reviews what METs are and how to increase METs.    Pulmonary Medications Verbally interactive group education provided by instructor with focus on inhaled medications and proper administration.   Anatomy and Physiology of the Respiratory System Group instruction provided by PowerPoint, verbal discussion, and written material to support subject matter. Instructor reviews respiratory cycle and anatomical components of the respiratory system and their  functions. Instructor also reviews differences in obstructive and restrictive respiratory diseases with examples of each.    Oxygen Safety Group instruction provided by PowerPoint, verbal discussion, and written material to support subject matter. There is an overview of "What is Oxygen" and "Why do we need it".  Instructor also reviews how to create a safe environment for oxygen use, the importance of using oxygen as prescribed, and the risks of noncompliance. There is a brief discussion on traveling with oxygen and resources the patient may utilize.   Oxygen Use Group instruction provided by PowerPoint, verbal discussion, and written material to discuss how supplemental oxygen is prescribed and different types of oxygen supply systems. Resources for more information are provided.    Breathing Techniques Group instruction that is supported by demonstration and informational handouts. Instructor discusses the benefits of pursed lip and diaphragmatic breathing and detailed demonstration on how to perform both.     Risk Factor Reduction Group instruction that is supported by a PowerPoint presentation. Instructor discusses the definition of a risk factor, different risk factors for pulmonary disease, and how the heart and lungs work together.   MD Day A group question and answer session with a medical doctor that allows participants to ask questions that relate to their pulmonary disease state.   Nutrition for the Pulmonary Patient Group instruction provided  by PowerPoint slides, verbal discussion, and written materials to support subject matter. The instructor gives an explanation and review of healthy diet recommendations, which includes a discussion on weight management, recommendations for fruit and vegetable consumption, as well as protein, fluid, caffeine, fiber, sodium, sugar, and alcohol. Tips for eating when patients are short of breath are discussed.    Other Education Group or  individual verbal, written, or video instructions that support the educational goals of the pulmonary rehab program.    Knowledge Questionnaire Score:   Core Components/Risk Factors/Patient Goals at Admission:  Personal Goals and Risk Factors at Admission - 02/18/22 1323       Core Components/Risk Factors/Patient Goals on Admission   Improve shortness of breath with ADL's Yes    Intervention Provide education, individualized exercise plan and daily activity instruction to help decrease symptoms of SOB with activities of daily living.    Expected Outcomes Short Term: Improve cardiorespiratory fitness to achieve a reduction of symptoms when performing ADLs;Long Term: Be able to perform more ADLs without symptoms or delay the onset of symptoms    Increase knowledge of respiratory medications and ability to use respiratory devices properly  Yes    Intervention Provide education and demonstration as needed of appropriate use of medications, inhalers, and oxygen therapy.    Expected Outcomes Short Term: Achieves understanding of medications use. Understands that oxygen is a medication prescribed by physician. Demonstrates appropriate use of inhaler and oxygen therapy.;Long Term: Maintain appropriate use of medications, inhalers, and oxygen therapy.    Diabetes Yes    Intervention Provide education about signs/symptoms and action to take for hypo/hyperglycemia.;Provide education about proper nutrition, including hydration, and aerobic/resistive exercise prescription along with prescribed medications to achieve blood glucose in normal ranges: Fasting glucose 65-99 mg/dL    Expected Outcomes Short Term: Participant verbalizes understanding of the signs/symptoms and immediate care of hyper/hypoglycemia, proper foot care and importance of medication, aerobic/resistive exercise and nutrition plan for blood glucose control.;Long Term: Attainment of HbA1C < 7%.             Core Components/Risk  Factors/Patient Goals Review:    Core Components/Risk Factors/Patient Goals at Discharge (Final Review):    ITP Comments:   Comments: Dr. Mechele Collin is the Medical Director for Pulmonary Rehab

## 2022-02-18 NOTE — Telephone Encounter (Signed)
Called [facility] for status update on patient's pirfenidone. Per rep, rx was filled on 02/05/2022 at [facility]. I am closing this encounter. It appears that this was prescribed by [facility] provider?  Phone: [phone]  Knox Saliva, PharmD, MPH, BCPS, CPP Clinical Pharmacist (Rheumatology and Pulmonology)

## 2022-02-18 NOTE — Progress Notes (Addendum)
Richard Davenport 62 y.o. male  Pulmonary Rehab Orientation Note  This patient who was referred to Pulmonary Rehab by Dr. Ander Slade with the diagnosis of IPF arrived today in Cardiac and Pulmonary Rehab. He arrived ambulatory with normal gait. He does not carry portable oxygen. Per patient, Richard Davenport does not use oxygen. Color good, skin warm and dry. Patient is oriented to time and place. Patient's medical history, psychosocial health, and medications reviewed. Psychosocial assessment reveals patient lives with spouse and children. 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 Patient hobbies include watching tv. Patient reports his stress level is low. Areas of stress/anxiety include family  and work. Patient does not exhibit signs of depression. PHQ2/9 score 0/1. Richard Davenport shows good  coping skills with positive outlook on life. Offered emotional support and reassurance. Will continue to monitor and evaluate progress toward psychosocial goal(s) of decreasing stress. Physical assessment reveals heart rate is normal, breath sounds clear to auscultation, no wheezes, rales, or rhonchi. Grip strength equal, strong. Distal pulses present, no swelling noted to lower extremities. Richard Davenport reports he  does take medications as prescribed. Patient states he  follows a regular  diet. The patient reports no specific efforts to gain or lose weight.. [name] weight will be monitored closely. Demonstration and practice of PLB using pulse oximeter. Richard Davenport able to return demonstration satisfactorily. Safety and hand hygiene in the exercise area reviewed with patient. Richard Davenport voices understanding of the information reviewed. Department expectations discussed with patient and achievable goals were set. The patient shows enthusiasm about attending the program and we look forward to working with Richard Davenport. Richard Davenport completed a 6 min walk test today and is scheduled to begin exercise on 02/26/2022 at 1:15pm.   [id number] Janine Ores, RN, BSN

## 2022-02-18 NOTE — Progress Notes (Signed)
Richard Davenport    [id number]    1960/09/12  Primary Care Physician:Pcp, No  Referring Physician: Rich Fuchs, [place],  [place] [id number]  Chief complaint:   Chronic cough about 2 to 3 years  HPI:  Noted to have pulmonary fibrosis on his CT, PFT with severe restriction Did see Dr. Loanne Drilling since the last time I saw him -Initiated Esbriet, yet to start medication  He recently did see pulmonary at [facility] -Has a follow-up appointment in May 2024 -[facility] will be managing Esbriet -C-ANCA positive, request for vasculitic panel and inflammation panel sent  Recent blood work did reveal elevated alkaline phosphatase, CT with evidence of nodular contour to his liver  Recently traveled to [place] where he had a CT scan of the chest done which showed evidence of bronchiectasis, honeycombing, suspicious for UIP -Now has a repeat CT showing evidence of pulmonary fibrosis, bronchiectasis  He has had a dry cough about 2 to 3 years -Medications have not really helped the cough -Was on Tessalon Perles, Delsym did not help -Was referred to speech therapy and ENT from his recent visit to [place]  History of diabetes which is well-controlled History of CVA for which he had coils placed in 2014 and also 2020  Past history of chronic headaches which is better at present  Recently noted to be anemic-vitamin B12 shots  No pets  Never smoker  No alcohol use  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 does expose him to dust, 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, 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 -He recently had some accommodations to his work environment to limit exposure  Has been having some back pain, usually upon waking in the morning, does take Tylenol which helps the pain  Continues to be short of breath with activity, able to still try and get most of his activities done Cough has no particular predilection for any  particular time of the day  He does have a past history of reflux but does not have significant symptoms recently suggesting  ongoing reflux   Outpatient Encounter Medications as of 02/18/2022  Medication Sig   albuterol (VENTOLIN HFA) 108 (90 Base) MCG/ACT inhaler Inhale 2 puffs into the lungs every 6 (six) hours as needed for wheezing or shortness of breath.   benzonatate (TESSALON) 200 MG capsule Take 1 capsule (200 mg total) by mouth 3 (three) times daily as needed for cough.   budesonide-formoterol (SYMBICORT) 160-4.5 MCG/ACT inhaler Inhale 2 puffs into the lungs 2 (two) times daily.   metFORMIN (GLUCOPHAGE) 500 MG tablet Take 1 tablet (500 mg total) by mouth 2 (two) times daily.   aspirin EC 81 MG EC tablet Take 1 tablet (81 mg total) by mouth daily. (Patient not taking: Reported on 12/26/2021)   Pirfenidone 267 MG TABS Month 1: Take 1 tab three times daily for 7 days, then 2 tabs three times daily for 7 days, then 3 tabs three times daily thereafter. (Patient not taking: Reported on 02/18/2022)   [START ON 02/21/2022] Pirfenidone 267 MG TABS Take 3 tablets (801 mg total) by mouth with breakfast, with lunch, and with evening meal. Month 2 and onwards (Patient not taking: Reported on 02/18/2022)   [DISCONTINUED] clopidogrel (PLAVIX) 75 MG tablet Take 1 tablet (75 mg total) by mouth daily. Only take for 21 days. Take with aspirin and Continue aspirin once you stop  plavix (Patient not taking: Reported on 12/26/2021)   No facility-administered encounter medications on file as of 02/18/2022.    Allergies as of 02/18/2022 - Review Complete 02/18/2022  Allergen Reaction Noted   Pork-derived products Other (See Comments) 10/11/2012    Past Medical History:  Diagnosis Date   Headache    Stroke ([employer])    Subarachnoid hemorrhage ([place])     Past Surgical History:  Procedure Laterality Date   ANEURYSM COILING     for bleed   LOOP RECORDER INSERTION N/A 04/10/2018   Procedure: LOOP RECORDER  INSERTION;  Surgeon: Deboraha Sprang, MD;  Location: [facility];  Service: Cardiovascular;  Laterality: N/A;   RADIOLOGY WITH ANESTHESIA N/A 10/09/2012   Procedure: RADIOLOGY WITH ANESTHESIA;  Surgeon: Consuella Lose, MD;  Location: [facility];  Service: Radiology;  Laterality: N/A;   RADIOLOGY WITH ANESTHESIA N/A 05/27/2013   Procedure: RADIOLOGY WITH ANESTHESIA;  Surgeon: Consuella Lose, MD;  Location: [facility];  Service: Radiology;  Laterality: N/A;    Family History  Problem Relation Age of Onset   Hypertension Mother    Hypertension Father     Social History   Socioeconomic History   Marital status: Married    Spouse name: Not on file   Number of children: Not on file   Years of education: Not on file   Highest education level: Not on file  Occupational History   Not on file  Tobacco Use   Smoking status: Never   Smokeless tobacco: Never  Vaping Use   Vaping Use: Never used  Substance and Sexual Activity   Alcohol use: No   Drug use: No   Sexual activity: Not Currently  Other Topics Concern   Not on file  Social History Narrative   Not on file   Social Determinants of Health   Financial Resource Strain: Not on file  Food Insecurity: Not on file  Transportation Needs: Not on file  Physical Activity: Not on file  Stress: Not on file  Social Connections: Not on file  Intimate Partner Violence: Not on file    Review of Systems  Constitutional:  Positive for appetite change and fatigue.  Respiratory:  Positive for cough and shortness of breath.     Vitals:   02/18/22 0942  BP: 130/76  Pulse: 72  SpO2: 98%     Physical Exam Constitutional:      Appearance: Normal appearance.  HENT:     Head: Normocephalic.     Mouth/Throat:     Mouth: Mucous membranes are moist.  Cardiovascular:     Rate and Rhythm: Normal rate and regular rhythm.     Heart sounds: No murmur heard.    No friction rub.  Pulmonary:     Effort: No respiratory distress.      Breath sounds: No stridor. Rales present. No wheezing or rhonchi.  Musculoskeletal:     Cervical back: No rigidity or tenderness.  Neurological:     Mental Status: He is alert.  Psychiatric:        Mood and Affect: Mood normal.   Data Reviewed: Patient had a recent CT scan while in [place] in October 2023 showing evidence of possible UIP with fibrosis, bronchiectasis, traction, honeycombing  Most recent CT showing evidence of fibrosis, UIP mediastinal adenopathy, changes consistent with cirrhosis   C ANCA was positive  Assessment:   Pulmonary fibrosis -UIP  Chronic cough -Related to his pulmonary fibrosis  Cough and some voice changes -Referred to  ENT and speech  Shortness of breath with activity/exposure -Continue Symbicort -Inhaler technique was reviewed and discussed during the visit today  Recent 6-minute walk did not show 409 m  Back pain  Discussed disability application  Abnormal LFT, concern for cirrhosis -Refer to GI   Plan/Recommendations:  Will make a referral to Dr. Marchelle Gearing for continued management of interstitial lung disease and for his expert opinion  Continue Symbicort as needed for shortness of breath  Graded exercises as tolerated  Repeat LFT  Initiation of Esbriet once received  Will try to make an appointment with Dr. Marchelle Gearing in the next 4 to 8 weeks  Rescue inhaler use as needed-prescription sent in for albuterol  He will be starting pulmonary rehab  Follow-up with appointment with GI  Virl Diamond MD [facility] 02/18/2022, 10:17 AM  CC: Heron Nay, PA

## 2022-02-19 ENCOUNTER — Ambulatory Visit (HOSPITAL_COMMUNITY): Payer: Commercial Managed Care - PPO

## 2022-02-21 ENCOUNTER — Ambulatory Visit (HOSPITAL_COMMUNITY): Payer: Commercial Managed Care - PPO

## 2022-02-26 ENCOUNTER — Encounter (HOSPITAL_COMMUNITY): Payer: Commercial Managed Care - PPO

## 2022-02-26 ENCOUNTER — Ambulatory Visit (HOSPITAL_COMMUNITY): Payer: Commercial Managed Care - PPO

## 2022-02-28 ENCOUNTER — Encounter (HOSPITAL_COMMUNITY): Payer: Commercial Managed Care - PPO

## 2022-02-28 ENCOUNTER — Ambulatory Visit (HOSPITAL_COMMUNITY): Payer: Commercial Managed Care - PPO

## 2022-03-05 ENCOUNTER — Telehealth (HOSPITAL_COMMUNITY): Payer: Self-pay

## 2022-03-05 ENCOUNTER — Ambulatory Visit (HOSPITAL_COMMUNITY): Payer: Commercial Managed Care - PPO

## 2022-03-05 ENCOUNTER — Encounter (HOSPITAL_COMMUNITY): Payer: Commercial Managed Care - PPO

## 2022-03-05 NOTE — Telephone Encounter (Signed)
Called to check on patient since he hasn't started Pulmonary Rehab class yet. Has been out for the last week and a half. LVM for patient to return call.

## 2022-03-06 ENCOUNTER — Telehealth: Payer: Self-pay | Admitting: Pulmonary Disease

## 2022-03-06 DIAGNOSIS — Z5181 Encounter for therapeutic drug level monitoring: Secondary | ICD-10-CM

## 2022-03-06 NOTE — Progress Notes (Signed)
Pulmonary Individual Treatment Plan  Patient Details  Name: Richard Davenport MRN: [id number] Date of Birth: [birth year] Referring Provider:   April Manson Pulmonary Rehab Walk Test from 02/18/2022 in [facility] [place]  Referring Provider [place]       Initial Encounter Date:  [place] Pulmonary Rehab Walk Test from 02/18/2022 in [facility], V[facility], [facility]  Date 02/18/22       Visit Diagnosis: IPF (idiopathic pulmonary fibrosis) ([place])  Patient's Home Medications on Admission:   Current Outpatient Medications:    albuterol (VENTOLIN HFA) 108 (90 Base) MCG/ACT inhaler, Inhale 2 puffs into the lungs every 6 (six) hours as needed for wheezing or shortness of breath., Disp: 8 g, Rfl: 6   benzonatate (TESSALON) 200 MG capsule, Take 1 capsule (200 mg total) by mouth 3 (three) times daily as needed for cough., Disp: 90 capsule, Rfl: 1   budesonide-formoterol (SYMBICORT) 160-4.5 MCG/ACT inhaler, Inhale 2 puffs into the lungs 2 (two) times daily., Disp: 1 each, Rfl: 5   metFORMIN (GLUCOPHAGE) 500 MG tablet, Take 1 tablet (500 mg total) by mouth 2 (two) times daily., Disp: 60 tablet, Rfl: 1   aspirin EC 81 MG EC tablet, Take 1 tablet (81 mg total) by mouth daily. (Patient not taking: Reported on 12/26/2021), Disp: , Rfl:    Pirfenidone 267 MG TABS, Month 1: Take 1 tab three times daily for 7 days, then 2 tabs three times daily for 7 days, then 3 tabs three times daily thereafter. (Patient not taking: Reported on 02/18/2022), Disp: 270 tablet, Rfl: 0   Pirfenidone 267 MG TABS, Take 3 tablets (801 mg total) by mouth with breakfast, with lunch, and with evening meal. Month 2 and onwards (Patient not taking: Reported on 02/18/2022), Disp: 270 tablet, Rfl: 5  Past Medical History: Past Medical History:  Diagnosis Date   Diabetes mellitus without complication ([place])    Headache    Idiopathic pulmonary fibrosis  ([place])    Stroke ([place])    Subarachnoid hemorrhage ([facility])     Tobacco Use: Social History   Tobacco Use  Smoking Status Never  Smokeless Tobacco Never    Labs: Review Flowsheet  More data exists      Latest Ref Rng & Units 10/09/2012 08/26/2013 08/27/2013 12/14/2017 04/09/2018  Labs for ITP Cardiac and Pulmonary Rehab  Cholestrol 0 - 200 mg/dL - - 153  - 139   LDL (calc) 0 - 99 mg/dL - - 37  - 72   HDL-C >40 mg/dL - - 44  - 48   Trlycerides <150 mg/dL - - 361  - 96   Hemoglobin A1c 4.8 - 5.6 % - 5.8  5.7  12.0  5.9   PH, Arterial 7.350 - 7.450 7.386  7.353  - - - -  PCO2 arterial 35.0 - 45.0 mmHg 37.6  39.8  - - - -  Bicarbonate 20.0 - 28.0 mmol/L 22.6  22.1  - - 26.7  -  TCO2 22 - 32 mmol/L 24  23  23  $ - 28  -  Acid-base deficit 0.0 - 2.0 mmol/L 2.0  3.0  - - - -  O2 Saturation % 98.0  99.0  - - 76.0  -    Capillary Blood Glucose: Lab Results  Component Value Date   GLUCAP 135 (H) 02/18/2022   GLUCAP 141 (H) 04/10/2018   GLUCAP 105 (H) 04/10/2018   GLUCAP 137 (H) 04/09/2018  GLUCAP 144 (H) 04/09/2018    POCT Glucose     Row Name 02/18/22 1351             POCT Blood Glucose   Pre-Exercise 135 mg/dL                Pulmonary Assessment Scores:  Pulmonary Assessment Scores     Row Name 02/18/22 1325         ADL UCSD   SOB Score total 17       CAT Score   CAT Score 9       mMRC Score   mMRC Score 2             UCSD: Self-administered rating of dyspnea associated with activities of daily living (ADLs) 6-point scale (0 = "not at all" to 5 = "maximal or unable to do because of breathlessness")  Scoring Scores range from 0 to 120.  Minimally important difference is 5 units  CAT: CAT can identify the health impairment of COPD patients and is better correlated with disease progression.  CAT has a scoring range of zero to 40. The CAT score is classified into four groups of low (less than 10), medium (10 - 20), high (21-30) and very high (31-40) based  on the impact level of disease on health status. A CAT score over 10 suggests significant symptoms.  A worsening CAT score could be explained by an exacerbation, poor medication adherence, poor inhaler technique, or progression of COPD or comorbid conditions.  CAT MCID is 2 points  mMRC: mMRC (Modified Medical Research Council) Dyspnea Scale is used to assess the degree of baseline functional disability in patients of respiratory disease due to dyspnea. No minimal important difference is established. A decrease in score of 1 point or greater is considered a positive change.   Pulmonary Function Assessment:  Pulmonary Function Assessment - 02/18/22 1337       Breath   Bilateral Breath Sounds Decreased    Shortness of Breath No             Exercise Target Goals: Exercise Program Goal: Individual exercise prescription set using results from initial 6 min walk test and THRR while considering  patient's activity barriers and safety.   Exercise Prescription Goal: Initial exercise prescription builds to 30-45 minutes a day of aerobic activity, 2-3 days per week.  Home exercise guidelines will be given to patient during program as part of exercise prescription that the participant will acknowledge.  Activity Barriers & Risk Stratification:  Activity Barriers & Cardiac Risk Stratification - 02/18/22 1322       Activity Barriers & Cardiac Risk Stratification   Activity Barriers Deconditioning;Muscular Weakness;Shortness of Breath             6 Minute Walk:  6 Minute Walk     Row Name 02/18/22 1419         6 Minute Walk   Phase Initial     Distance 1426 feet     Walk Time 6 minutes     # of Rest Breaks 0     MPH 2.7     METS 3.88     RPE 9     Perceived Dyspnea  0     VO2 Peak 13.6     Symptoms No     Resting HR 77 bpm     Resting BP 124/72     Resting Oxygen Saturation  97 %     Exercise Oxygen Saturation  during 6 min walk 89 %     Max Ex. HR 103 bpm     Max Ex.  BP 140/78     2 Minute Post BP 140/80       Interval HR   1 Minute HR 93     2 Minute HR 100     3 Minute HR 103     4 Minute HR 101     5 Minute HR 103     6 Minute HR 100     2 Minute Post HR 76     Interval Heart Rate? Yes       Interval Oxygen   Interval Oxygen? Yes     Baseline Oxygen Saturation % 97 %     1 Minute Oxygen Saturation % 93 %     1 Minute Liters of Oxygen 0 L     2 Minute Oxygen Saturation % 90 %     2 Minute Liters of Oxygen 0 L     3 Minute Oxygen Saturation % 91 %     3 Minute Liters of Oxygen 0 L     4 Minute Oxygen Saturation % 90 %     4 Minute Liters of Oxygen 0 L     5 Minute Oxygen Saturation % 89 %     5 Minute Liters of Oxygen 0 L     6 Minute Oxygen Saturation % 90 %     6 Minute Liters of Oxygen 0 L     2 Minute Post Oxygen Saturation % 97 %     2 Minute Post Liters of Oxygen 0 L              Oxygen Initial Assessment:  Oxygen Initial Assessment - 02/18/22 1353       Home Oxygen   Home Oxygen Device None    Sleep Oxygen Prescription None    Home Exercise Oxygen Prescription None    Home Resting Oxygen Prescription None      Initial 6 min Walk   Oxygen Used None      Program Oxygen Prescription   Program Oxygen Prescription None      Intervention   Short Term Goals To learn and understand importance of maintaining oxygen saturations>88%;To learn and demonstrate proper use of respiratory medications;To learn and demonstrate proper pursed lip breathing techniques or other breathing techniques. ;To learn and understand importance of monitoring SPO2 with pulse oximeter and demonstrate accurate use of the pulse oximeter.    Long  Term Goals Maintenance of O2 saturations>88%;Compliance with respiratory medication;Exhibits proper breathing techniques, such as pursed lip breathing or other method taught during program session;Verbalizes importance of monitoring SPO2 with pulse oximeter and return demonstration             Oxygen  Re-Evaluation:  Oxygen Re-Evaluation     Row Name 02/26/22 1623             Program Oxygen Prescription   Program Oxygen Prescription None         Home Oxygen   Home Oxygen Device None       Sleep Oxygen Prescription None       Home Exercise Oxygen Prescription None       Home Resting Oxygen Prescription None         Goals/Expected Outcomes   Short Term Goals To learn and understand importance of maintaining oxygen saturations>88%;To learn and demonstrate proper use of respiratory medications;To learn and demonstrate proper  pursed lip breathing techniques or other breathing techniques. ;To learn and understand importance of monitoring SPO2 with pulse oximeter and demonstrate accurate use of the pulse oximeter.       Long  Term Goals Maintenance of O2 saturations>88%;Compliance with respiratory medication;Exhibits proper breathing techniques, such as pursed lip breathing or other method taught during program session;Verbalizes importance of monitoring SPO2 with pulse oximeter and return demonstration       Goals/Expected Outcomes Compliance and understanding of oxygen saturation monitoring and breathing techniques to decrease shortness of breath.                Oxygen Discharge (Final Oxygen Re-Evaluation):  Oxygen Re-Evaluation - 02/26/22 1623       Program Oxygen Prescription   Program Oxygen Prescription None      Home Oxygen   Home Oxygen Device None    Sleep Oxygen Prescription None    Home Exercise Oxygen Prescription None    Home Resting Oxygen Prescription None      Goals/Expected Outcomes   Short Term Goals To learn and understand importance of maintaining oxygen saturations>88%;To learn and demonstrate proper use of respiratory medications;To learn and demonstrate proper pursed lip breathing techniques or other breathing techniques. ;To learn and understand importance of monitoring SPO2 with pulse oximeter and demonstrate accurate use of the pulse oximeter.     Long  Term Goals Maintenance of O2 saturations>88%;Compliance with respiratory medication;Exhibits proper breathing techniques, such as pursed lip breathing or other method taught during program session;Verbalizes importance of monitoring SPO2 with pulse oximeter and return demonstration    Goals/Expected Outcomes Compliance and understanding of oxygen saturation monitoring and breathing techniques to decrease shortness of breath.             Initial Exercise Prescription:  Initial Exercise Prescription - 02/18/22 1400       Date of Initial Exercise RX and Referring Provider   Date 02/18/22    Referring Provider Olalere    Expected Discharge Date 04/25/22      Elliptical   Level 2    Speed 70    Minutes 15    METs 3      Rower   Level 2    Minutes 15    METs 3      Prescription Details   Frequency (times per week) 2    Duration Progress to 30 minutes of continuous aerobic without signs/symptoms of physical distress      Intensity   THRR 40-80% of Max Heartrate 64-129    Ratings of Perceived Exertion 11-13    Perceived Dyspnea 0-4      Progression   Progression Continue to progress workloads to maintain intensity without signs/symptoms of physical distress.      Resistance Training   Training Prescription Yes    Weight blue bands    Reps 10-15             Perform Capillary Blood Glucose checks as needed.  Exercise Prescription Changes:   Exercise Comments:   Exercise Goals and Review:   Exercise Goals     Row Name 02/18/22 1321 02/26/22 1620           Exercise Goals   Increase Physical Activity Yes Yes      Intervention Provide advice, education, support and counseling about physical activity/exercise needs.;Develop an individualized exercise prescription for aerobic and resistive training based on initial evaluation findings, risk stratification, comorbidities and participant's personal goals. Provide advice, education, support and counseling  about  physical activity/exercise needs.;Develop an individualized exercise prescription for aerobic and resistive training based on initial evaluation findings, risk stratification, comorbidities and participant's personal goals.      Expected Outcomes Short Term: Attend rehab on a regular basis to increase amount of physical activity.;Long Term: Exercising regularly at least 3-5 days a week.;Long Term: Add in home exercise to make exercise part of routine and to increase amount of physical activity. Short Term: Attend rehab on a regular basis to increase amount of physical activity.;Long Term: Exercising regularly at least 3-5 days a week.;Long Term: Add in home exercise to make exercise part of routine and to increase amount of physical activity.      Increase Strength and Stamina Yes Yes      Intervention Provide advice, education, support and counseling about physical activity/exercise needs.;Develop an individualized exercise prescription for aerobic and resistive training based on initial evaluation findings, risk stratification, comorbidities and participant's personal goals. Provide advice, education, support and counseling about physical activity/exercise needs.;Develop an individualized exercise prescription for aerobic and resistive training based on initial evaluation findings, risk stratification, comorbidities and participant's personal goals.      Expected Outcomes Short Term: Increase workloads from initial exercise prescription for resistance, speed, and METs.;Short Term: Perform resistance training exercises routinely during rehab and add in resistance training at home;Long Term: Improve cardiorespiratory fitness, muscular endurance and strength as measured by increased METs and functional capacity (6MWT) Short Term: Increase workloads from initial exercise prescription for resistance, speed, and METs.;Short Term: Perform resistance training exercises routinely during rehab and add in  resistance training at home;Long Term: Improve cardiorespiratory fitness, muscular endurance and strength as measured by increased METs and functional capacity (6MWT)      Able to understand and use rate of perceived exertion (RPE) scale Yes Yes      Intervention Provide education and explanation on how to use RPE scale Provide education and explanation on how to use RPE scale      Expected Outcomes Short Term: Able to use RPE daily in rehab to express subjective intensity level;Long Term:  Able to use RPE to guide intensity level when exercising independently Short Term: Able to use RPE daily in rehab to express subjective intensity level;Long Term:  Able to use RPE to guide intensity level when exercising independently      Able to understand and use Dyspnea scale Yes Yes      Intervention Provide education and explanation on how to use Dyspnea scale Provide education and explanation on how to use Dyspnea scale      Expected Outcomes Short Term: Able to use Dyspnea scale daily in rehab to express subjective sense of shortness of breath during exertion;Long Term: Able to use Dyspnea scale to guide intensity level when exercising independently Short Term: Able to use Dyspnea scale daily in rehab to express subjective sense of shortness of breath during exertion;Long Term: Able to use Dyspnea scale to guide intensity level when exercising independently      Knowledge and understanding of Target Heart Rate Range (THRR) Yes Yes      Intervention Provide education and explanation of THRR including how the numbers were predicted and where they are located for reference Provide education and explanation of THRR including how the numbers were predicted and where they are located for reference      Expected Outcomes Short Term: Able to state/look up THRR;Short Term: Able to use daily as guideline for intensity in rehab;Long Term: Able to use THRR to govern  intensity when exercising independently Short Term: Able to  state/look up THRR;Short Term: Able to use daily as guideline for intensity in rehab;Long Term: Able to use THRR to govern intensity when exercising independently      Understanding of Exercise Prescription Yes Yes      Intervention Provide education, explanation, and written materials on patient's individual exercise prescription Provide education, explanation, and written materials on patient's individual exercise prescription      Expected Outcomes Short Term: Able to explain program exercise prescription;Long Term: Able to explain home exercise prescription to exercise independently Short Term: Able to explain program exercise prescription;Long Term: Able to explain home exercise prescription to exercise independently               Exercise Goals Re-Evaluation :  Exercise Goals Re-Evaluation     Row Name 02/26/22 1621             Exercise Goal Re-Evaluation   Exercise Goals Review Increase Physical Activity;Able to understand and use Dyspnea scale;Understanding of Exercise Prescription;Increase Strength and Stamina;Knowledge and understanding of Target Heart Rate Range (THRR);Able to understand and use rate of perceived exertion (RPE) scale       Comments Myrle was scheduled to begin exercise today. He did not attend due to pulling a muscle. His next scheduled exercise session is next week. Will continue to monitor and progress as able.       Expected Outcomes Through exercise at rehab and home, the patient will decrease shortness of breath with daily activities and feel confident in carrying out an exercise regimen at home.                Discharge Exercise Prescription (Final Exercise Prescription Changes):   Nutrition:  Target Goals: Understanding of nutrition guidelines, daily intake of sodium <1552m, cholesterol <2061m calories 30% from fat and 7% or less from saturated fats, daily to have 5 or more servings of fruits and vegetables.  Biometrics:  Pre Biometrics -  02/18/22 1422       Pre Biometrics   Grip Strength 41 kg              Nutrition Therapy Plan and Nutrition Goals:   Nutrition Assessments:  MEDIFICTS Score Key: ?70 Need to make dietary changes  40-70 Heart Healthy Diet ? 40 Therapeutic Level Cholesterol Diet   Picture Your Plate Scores: <[id number] dietary pattern with much room for improvement. 41-50 Dietary pattern unlikely to meet recommendations for good health and room for improvement. 51-60 More healthful dietary pattern, with some room for improvement.  >60 Healthy dietary pattern, although there may be some specific behaviors that could be improved.    Nutrition Goals Re-Evaluation:   Nutrition Goals Discharge (Final Nutrition Goals Re-Evaluation):   Psychosocial: Target Goals: Acknowledge presence or absence of significant depression and/or stress, maximize coping skills, provide positive support system. Participant is able to verbalize types and ability to use techniques and skills needed for reducing stress and depression.  Initial Review & Psychosocial Screening:  Initial Psych Review & Screening - 02/18/22 1322       Initial Review   Current issues with None Identified      Family Dynamics   Good Support System? Yes    Comments Married with 4 supportive children      Barriers   Psychosocial barriers to participate in program There are no identifiable barriers or psychosocial needs.      Screening Interventions   Interventions Encouraged to exercise  Expected Outcomes Long Term Goal: Stressors or current issues are controlled or eliminated.             Quality of Life Scores:  Scores of 19 and below usually indicate a poorer quality of life in these areas.  A difference of  2-3 points is a clinically meaningful difference.  A difference of 2-3 points in the total score of the Quality of Life Index has been associated with significant improvement in overall quality of life, self-image,  physical symptoms, and general health in studies assessing change in quality of life.  PHQ-9: Review Flowsheet       02/18/2022  Depression screen PHQ 2/9  Decreased Interest 0  Down, Depressed, Hopeless 0  PHQ - 2 Score 0  Altered sleeping 0  Tired, decreased energy 1  Change in appetite 0  Feeling bad or failure about yourself  0  Trouble concentrating 0  Moving slowly or fidgety/restless 0  Suicidal thoughts 0  PHQ-9 Score 1  Difficult doing work/chores Not difficult at all   Interpretation of Total Score  Total Score Depression Severity:  1-4 = Minimal depression, 5-9 = Mild depression, 10-14 = Moderate depression, 15-19 = Moderately severe depression, 20-27 = Severe depression   Psychosocial Evaluation and Intervention:  Psychosocial Evaluation - 02/18/22 1331       Psychosocial Evaluation & Interventions   Interventions Encouraged to exercise with the program and follow exercise prescription    Comments Ezekyel denies any psychosocial barriers at this time. We will continue to monitor and assess for any needs.    Expected Outcomes For Atthew to participate in the PR program without any barriers    Continue Psychosocial Services  No Follow up required             Psychosocial Re-Evaluation:  Psychosocial Re-Evaluation     [place] Name 02/22/22 1440             Psychosocial Re-Evaluation   Current issues with None Identified       Comments Eland has not started the PR program yet. His first day of class is 2/6. Jahmez has denied any psychosocial barriers or concerns during the orientation. We will continue to monitor and assess Khai if any needs may arise during classes.       Expected Outcomes For Ollie to participate in PR without any psychosocial barriers or concerns.       Interventions Encouraged to attend Pulmonary Rehabilitation for the exercise       Continue Psychosocial Services  No Follow up required                Psychosocial Discharge (Final  Psychosocial Re-Evaluation):  Psychosocial Re-Evaluation - 02/22/22 1440       Psychosocial Re-Evaluation   Current issues with None Identified    Comments [place] has not started the PR program yet. His first day of class is 2/6. Orba has denied any psychosocial barriers or concerns during the orientation. We will continue to monitor and assess Richie if any needs may arise during classes.    Expected Outcomes For Safir to participate in PR without any psychosocial barriers or concerns.    Interventions Encouraged to attend Pulmonary Rehabilitation for the exercise    Continue Psychosocial Services  No Follow up required             Education: Education Goals: Education classes will be provided on a weekly basis, covering required topics. Participant will state understanding/return demonstration of topics  presented.  Learning Barriers/Preferences:  Learning Barriers/Preferences - 02/18/22 1332       Learning Barriers/Preferences   Learning Barriers Sight   cataract right eye   Learning Preferences Group Instruction;Video             Education Topics: Introduction to Pulmonary Rehab Group instruction provided by PowerPoint, verbal discussion, and written material to support subject matter. Instructor reviews what Pulmonary Rehab is, the purpose of the program, and how patients are referred.     Know Your Numbers Group instruction that is supported by a PowerPoint presentation. Instructor discusses importance of knowing and understanding resting, exercise, and post-exercise oxygen saturation, heart rate, and blood pressure. Oxygen saturation, heart rate, blood pressure, rating of perceived exertion, and dyspnea are reviewed along with a normal range for these values.    Exercise for the Pulmonary Patient Group instruction that is supported by a PowerPoint presentation. Instructor discusses benefits of exercise, core components of exercise, frequency, duration, and intensity  of an exercise routine, importance of utilizing pulse oximetry during exercise, safety while exercising, and options of places to exercise outside of rehab.    MET Level  Group instruction provided by PowerPoint, verbal discussion, and written material to support subject matter. Instructor reviews what METs are and how to increase METs.    Pulmonary Medications Verbally interactive group education provided by instructor with focus on inhaled medications and proper administration.   Anatomy and Physiology of the Respiratory System Group instruction provided by PowerPoint, verbal discussion, and written material to support subject matter. Instructor reviews respiratory cycle and anatomical components of the respiratory system and their functions. Instructor also reviews differences in obstructive and restrictive respiratory diseases with examples of each.    Oxygen Safety Group instruction provided by PowerPoint, verbal discussion, and written material to support subject matter. There is an overview of "What is Oxygen" and "Why do we need it".  Instructor also reviews how to create a safe environment for oxygen use, the importance of using oxygen as prescribed, and the risks of noncompliance. There is a brief discussion on traveling with oxygen and resources the patient may utilize.   Oxygen Use Group instruction provided by PowerPoint, verbal discussion, and written material to discuss how supplemental oxygen is prescribed and different types of oxygen supply systems. Resources for more information are provided.    Breathing Techniques Group instruction that is supported by demonstration and informational handouts. Instructor discusses the benefits of pursed lip and diaphragmatic breathing and detailed demonstration on how to perform both.     Risk Factor Reduction Group instruction that is supported by a PowerPoint presentation. Instructor discusses the definition of a risk factor,  different risk factors for pulmonary disease, and how the heart and lungs work together.   MD Day A group question and answer session with a medical doctor that allows participants to ask questions that relate to their pulmonary disease state.   Nutrition for the Pulmonary Patient Group instruction provided by PowerPoint slides, verbal discussion, and written materials to support subject matter. The instructor gives an explanation and review of healthy diet recommendations, which includes a discussion on weight management, recommendations for fruit and vegetable consumption, as well as protein, fluid, caffeine, fiber, sodium, sugar, and alcohol. Tips for eating when patients are short of breath are discussed.    Other Education Group or individual verbal, written, or video instructions that support the educational goals of the pulmonary rehab program.    Knowledge Questionnaire Score:   Core Components/Risk  Factors/Patient Goals at Admission:  Personal Goals and Risk Factors at Admission - 02/18/22 1323       Core Components/Risk Factors/Patient Goals on Admission   Improve shortness of breath with ADL's Yes    Intervention Provide education, individualized exercise plan and daily activity instruction to help decrease symptoms of SOB with activities of daily living.    Expected Outcomes Short Term: Improve cardiorespiratory fitness to achieve a reduction of symptoms when performing ADLs;Long Term: Be able to perform more ADLs without symptoms or delay the onset of symptoms    Increase knowledge of respiratory medications and ability to use respiratory devices properly  Yes    Intervention Provide education and demonstration as needed of appropriate use of medications, inhalers, and oxygen therapy.    Expected Outcomes Short Term: Achieves understanding of medications use. Understands that oxygen is a medication prescribed by physician. Demonstrates appropriate use of inhaler and oxygen  therapy.;Long Term: Maintain appropriate use of medications, inhalers, and oxygen therapy.    Diabetes Yes    Intervention Provide education about signs/symptoms and action to take for hypo/hyperglycemia.;Provide education about proper nutrition, including hydration, and aerobic/resistive exercise prescription along with prescribed medications to achieve blood glucose in normal ranges: Fasting glucose 65-99 mg/dL    Expected Outcomes Short Term: Participant verbalizes understanding of the signs/symptoms and immediate care of hyper/hypoglycemia, proper foot care and importance of medication, aerobic/resistive exercise and nutrition plan for blood glucose control.;Long Term: Attainment of HbA1C < 7%.             Core Components/Risk Factors/Patient Goals Review:   Goals and Risk Factor Review     Row Name 02/25/22 1413             Core Components/Risk Factors/Patient Goals Review   Personal Goals Review Diabetes;Improve shortness of breath with ADL's;Develop more efficient breathing techniques such as purse lipped breathing and diaphragmatic breathing and practicing self-pacing with activity.;Increase knowledge of respiratory medications and ability to use respiratory devices properly.       Review [employer] has not yet started PR class. We look forward to him working on and achieving his goals. We will continue to monitor and assess throughout the program.       Expected Outcomes See Admission Goals                Core Components/Risk Factors/Patient Goals at Discharge (Final Review):   Goals and Risk Factor Review - 02/25/22 1413       Core Components/Risk Factors/Patient Goals Review   Personal Goals Review Diabetes;Improve shortness of breath with ADL's;Develop more efficient breathing techniques such as purse lipped breathing and diaphragmatic breathing and practicing self-pacing with activity.;Increase knowledge of respiratory medications and ability to use respiratory devices  properly.    Review [place] has not yet started PR class. We look forward to him working on and achieving his goals. We will continue to monitor and assess throughout the program.    Expected Outcomes See Admission Goals             ITP Comments:   Comments: Dr. Rodman Pickle is Medical Director for Pulmonary Rehab at [facility].

## 2022-03-07 ENCOUNTER — Encounter (HOSPITAL_COMMUNITY): Payer: Commercial Managed Care - PPO

## 2022-03-07 ENCOUNTER — Telehealth (HOSPITAL_COMMUNITY): Payer: Self-pay

## 2022-03-07 ENCOUNTER — Ambulatory Visit (HOSPITAL_COMMUNITY): Payer: Commercial Managed Care - PPO

## 2022-03-07 NOTE — Telephone Encounter (Signed)
Called to check on pt. Pts daughter answered and stated pt is still having back pain. Pt will try and come to [place] next week.

## 2022-03-07 NOTE — Telephone Encounter (Signed)
Pt daughter calling again. States he was referred to [facility] by Dr. Ander Slade.   [facility] wants to put this PT on 2 new meds (Autoimmune meds, Esprit & Cellcept) but would like preliminary lab work done first.   PT is 2 hrs from [place] so wanted to do labs locally.    [facility] fax'd in an order for lab work  to [place] but [facility] Spice says we do not take orders from outside [place].  Pls call to advise PT daughter on how to move fwd. @ [place]

## 2022-03-08 NOTE — Telephone Encounter (Signed)
Yes please.  It is okay to order the labs under my name

## 2022-03-08 NOTE — Telephone Encounter (Signed)
Called and spoke with [facility]. She is aware that I will send a message to AO to see if he is ok with ordering the labs under his name so that the patient does not have to go to [facility] for bloodwork.   I looked at his notes from [place]. It looks like they are wanting to start him on Esbriet and Cellcept. They want him to have a CBC and CMP checked.   AO, please advise if you are ok with [place] ordering these tests under your name so that he can have the labs drawn here. Thanks!

## 2022-03-08 NOTE — Telephone Encounter (Signed)
Called and spoke with [facility]. She verbalized understanding. I provided her with the lab hours. Order has been placed.   Nothing further needed at time of call.

## 2022-03-11 ENCOUNTER — Other Ambulatory Visit (INDEPENDENT_AMBULATORY_CARE_PROVIDER_SITE_OTHER): Payer: Commercial Managed Care - PPO

## 2022-03-11 DIAGNOSIS — Z5181 Encounter for therapeutic drug level monitoring: Secondary | ICD-10-CM | POA: Diagnosis not present

## 2022-03-11 LAB — COMPREHENSIVE METABOLIC PANEL
ALT: 24 U/L (ref 0–53)
AST: 38 U/L — ABNORMAL HIGH (ref 0–37)
Albumin: 2.4 g/dL — ABNORMAL LOW (ref 3.5–5.2)
Alkaline Phosphatase: 197 U/L — ABNORMAL HIGH (ref 39–117)
BUN: 21 mg/dL (ref 6–23)
CO2: 22 mEq/L (ref 19–32)
Calcium: 8.2 mg/dL — ABNORMAL LOW (ref 8.4–10.5)
Chloride: 108 mEq/L (ref 96–112)
Creatinine, Ser: 1.04 mg/dL (ref 0.40–1.50)
GFR: 78.55 mL/min (ref >60.00)
Glucose, Bld: 113 mg/dL — ABNORMAL HIGH (ref 70–99)
Potassium: 3.9 mEq/L (ref 3.5–5.1)
Sodium: 134 mEq/L — ABNORMAL LOW (ref 135–145)
Total Bilirubin: 0.8 mg/dL (ref 0.2–1.2)
Total Protein: 7.7 g/dL (ref 6.0–8.3)

## 2022-03-11 LAB — CBC WITH DIFFERENTIAL/PLATELET
Basophils Absolute: 0 10*3/uL (ref 0.0–0.1)
Basophils Relative: 0.8 % (ref 0.0–3.0)
Eosinophils Absolute: 0.4 10*3/uL (ref 0.0–0.7)
Eosinophils Relative: 6.9 % — ABNORMAL HIGH (ref 0.0–5.0)
HCT: 33.2 % — ABNORMAL LOW (ref 39.0–52.0)
Hemoglobin: 11.2 g/dL — ABNORMAL LOW (ref 13.0–17.0)
Lymphocytes Relative: 24.9 % (ref 12.0–46.0)
Lymphs Abs: 1.4 10*3/uL (ref 0.7–4.0)
MCHC: 33.7 g/dL (ref 30.0–36.0)
MCV: 115 fl — ABNORMAL HIGH (ref 78.0–100.0)
Monocytes Absolute: 0.4 10*3/uL (ref 0.1–1.0)
Monocytes Relative: 7.8 % (ref 3.0–12.0)
Neutro Abs: 3.3 10*3/uL (ref 1.4–7.7)
Neutrophils Relative %: 59.6 % (ref 43.0–77.0)
Platelets: 114 10*3/uL — ABNORMAL LOW (ref 150.0–400.0)
RBC: 2.89 Mil/uL — ABNORMAL LOW (ref 4.22–5.81)
RDW: 15.1 % (ref 11.5–15.5)
WBC: 5.5 10*3/uL (ref 4.0–10.5)

## 2022-03-12 ENCOUNTER — Ambulatory Visit (HOSPITAL_COMMUNITY): Payer: Commercial Managed Care - PPO

## 2022-03-12 ENCOUNTER — Encounter (HOSPITAL_COMMUNITY)
Admission: RE | Admit: 2022-03-12 | Discharge: 2022-03-12 | Disposition: A | Discharge status: Home / Self Care | Payer: Commercial Managed Care - PPO | Source: Ambulatory Visit | Attending: Pulmonary Disease | Admitting: Pulmonary Disease

## 2022-03-12 DIAGNOSIS — J84112 Idiopathic pulmonary fibrosis: Secondary | ICD-10-CM | POA: Insufficient documentation

## 2022-03-12 LAB — GLUCOSE, CAPILLARY
Glucose-Capillary: 103 mg/dL — ABNORMAL HIGH (ref 70–99)
Glucose-Capillary: 102 mg/dL — ABNORMAL HIGH (ref 70–99)

## 2022-03-12 NOTE — Progress Notes (Signed)
Daily Session Note  Patient Details  Name: Richard Davenport MRN: [id number] Date of Birth: [birth year] Referring Provider:   April Manson Pulmonary Rehab Walk Test from 02/18/2022 in [facility] for Heart, Vascular, & [place]  Referring Provider [place]       Encounter Date: 03/12/2022  Check In:  Session Check In - 03/12/22 1132       Check-In   Supervising physician immediately available to respond to emergencies [facility] MD immediately available    Physician(s) Eric Form, NP    Location [facility]    Staff Present Janine Ores, RN, Quentin Ore, MS, ACSM-CEP, Exercise Physiologist;Klarissa Mcilvain Yevonne Pax, ACSM-CEP, Exercise Physiologist;Samantha [place], RD, LDN;Other    Virtual Visit No    Medication changes reported     No    Fall or balance concerns reported    No    Tobacco Cessation No Change    Warm-up and Cool-down Performed as group-led instruction    Resistance Training Performed Yes    VAD Patient? No    PAD/SET Patient? No      Pain Assessment   Currently in Pain? No/denies    Multiple Pain Sites No             Capillary Blood Glucose: Results for orders placed or performed during the hospital encounter of 03/12/22 (from the past 24 hour(s))  Glucose, capillary     Status: Abnormal   Collection Time: 03/12/22 10:24 AM  Result Value Ref Range   Glucose-Capillary 103 (H) 70 - 99 mg/dL  Glucose, capillary     Status: Abnormal   Collection Time: 03/12/22 11:33 AM  Result Value Ref Range   Glucose-Capillary 102 (H) 70 - 99 mg/dL      Social History   Tobacco Use  Smoking Status Never  Smokeless Tobacco Never    Goals Met:  Exercise tolerated well Strength training completed today  Goals Unmet:  Not Applicable  Comments: Pt completed first day of exercise. His SpO2 was 70 RA with strenuous exercise. Will monitor with future sessions, anticipate pt will need O2. Service time is from 1011 to 1139.    Dr. Rodman Pickle is Medical Director for Pulmonary Rehab at [facility].

## 2022-03-13 ENCOUNTER — Telehealth: Payer: Self-pay | Admitting: Internal Medicine

## 2022-03-13 ENCOUNTER — Other Ambulatory Visit (HOSPITAL_COMMUNITY): Payer: Self-pay

## 2022-03-13 DIAGNOSIS — J84112 Idiopathic pulmonary fibrosis: Secondary | ICD-10-CM

## 2022-03-13 MED ORDER — PIRFENIDONE 267 MG PO TABS: 801.0000 mg | ORAL_TABLET | Freq: Three times a day (TID) | ORAL | 5 refills | Status: DC

## 2022-03-13 NOTE — Telephone Encounter (Signed)
[facility] where patient filled first month of ESBRIET. Per pharmacy rep, patient received one month supply of BRAND-NAME ESBRIET. They were able to successfully adjudicate claim through his Medicaid for $4 copay. Will not need to move forward with PAP for now as it appears that this Medicaid plan does NOT have coordinated benefits.  Knox Saliva, PharmD, MPH, BCPS, CPP Clinical Pharmacist (Rheumatology and Pulmonology)

## 2022-03-13 NOTE — Telephone Encounter (Signed)
Nikki from [employer] calling to speak with someone regarding coverage for pirfenidone. Please call direct line at [phone].

## 2022-03-14 ENCOUNTER — Encounter (HOSPITAL_COMMUNITY)
Admission: RE | Admit: 2022-03-14 | Discharge: 2022-03-14 | Disposition: A | Discharge status: Home / Self Care | Payer: Commercial Managed Care - PPO | Source: Ambulatory Visit | Attending: Pulmonary Disease | Admitting: Pulmonary Disease

## 2022-03-14 ENCOUNTER — Telehealth: Payer: Self-pay | Admitting: Pulmonary Disease

## 2022-03-14 ENCOUNTER — Ambulatory Visit (HOSPITAL_COMMUNITY): Payer: Commercial Managed Care - PPO

## 2022-03-14 DIAGNOSIS — J84112 Idiopathic pulmonary fibrosis: Secondary | ICD-10-CM | POA: Diagnosis not present

## 2022-03-14 LAB — GLUCOSE, CAPILLARY
Glucose-Capillary: 104 mg/dL — ABNORMAL HIGH (ref 70–99)
Glucose-Capillary: 100 mg/dL — ABNORMAL HIGH (ref 70–99)

## 2022-03-14 NOTE — Progress Notes (Signed)
Daily Session Note  Patient Details  Name: Richard Davenport MRN: [id number] Date of Birth: 03/24/1960 Referring Provider:   April Manson Pulmonary Rehab Walk Test from 02/18/2022 in [facility] for Heart, Vascular, & [facility]  Referring Provider [place]       Encounter Date: 03/14/2022  Check In:  Session Check In - 03/14/22 1157       Check-In   Supervising physician immediately available to respond to emergencies [facility] MD immediately available    Physician(s) Dr Vaughan Browner    Location [facility]    Staff Present Janine Ores, RN, BSN;Randi Olen Cordial BS, ACSM-CEP, Exercise Physiologist;Kaylee Rosana Hoes, MS, ACSM-CEP, Exercise Physiologist;Samantha [place], RD, LDN;Other    Virtual Visit No    Medication changes reported     No    Fall or balance concerns reported    No    Tobacco Cessation No Change    Warm-up and Cool-down Performed as group-led instruction    Resistance Training Performed Yes    VAD Patient? No    PAD/SET Patient? No      Pain Assessment   Currently in Pain? No/denies    Multiple Pain Sites No             Capillary Blood Glucose: Results for orders placed or performed during the hospital encounter of 03/12/22 (from the past 24 hour(s))  Glucose, capillary     Status: Abnormal   Collection Time: 03/14/22 10:11 AM  Result Value Ref Range   Glucose-Capillary 104 (H) 70 - 99 mg/dL  Glucose, capillary     Status: Abnormal   Collection Time: 03/14/22 11:41 AM  Result Value Ref Range   Glucose-Capillary 100 (H) 70 - 99 mg/dL      Social History   Tobacco Use  Smoking Status Never  Smokeless Tobacco Never    Goals Met:  Exercise tolerated well Queuing for purse lip breathing No report of concerns or symptoms today Strength training completed today  Goals Unmet:  Patient's oxygen saturation dropped, applied O2 and had to titrate up to 3L [place].   Comments: Service time is from 1007 to 1138    Dr. Rodman Pickle is  Medical Director for Pulmonary Rehab at [facility].

## 2022-03-14 NOTE — Telephone Encounter (Signed)
Arvilla Market returned call to [facility], [employer], [employer] and was advised that pirfenidone was authorized incorrectly. [employer] will NOT cover pirfenidone. 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 Orphan and specialty drugs are not covered by the plan at all - they are plan exclusions with no opportunity for appeal or prior authorization.  We requested a denial letter for brand-name ESBRIET in case we need for [facility] patient assistance application submission.  Dierdre Forth phone: [phone], extension [place], PharmD, MPH, BCPS, CPP Clinical Pharmacist (Rheumatology and Pulmonology)

## 2022-03-14 NOTE — Telephone Encounter (Signed)
Schedule for Follow-up  May have an appointment already  Was called about patient requiring oxygen supplementation at rehab, can be evaluated in the office to see how we can help plan for oxygen supplementation with activity

## 2022-03-15 NOTE — Telephone Encounter (Signed)
Dr. Jenetta Downer patient has a follow up scheduled with Dr. Chase Caller on March 5th. Does he need to be seen sooner? Please advise

## 2022-03-15 NOTE — Telephone Encounter (Signed)
Encourage to get a pulse ox and check regularly, if he notices he continues to go below 88% with activity, then he needs to come in to be walked formerly to qualify for oxygen before the appt, otherwise ca keep the scheduled appt

## 2022-03-15 NOTE — Telephone Encounter (Signed)
Spoke with daughter. She states patient has a pulse ox at home. I advised her per Dr. Jenetta Downer that patients needs to regularly monitor oxygen and if patients sats drop below 88% he needs to be evaluated in office for a sooner appointment to be qualified for o2. She verbalized understanding. Nothing further needed.

## 2022-03-19 ENCOUNTER — Ambulatory Visit (HOSPITAL_COMMUNITY): Payer: Commercial Managed Care - PPO

## 2022-03-19 ENCOUNTER — Telehealth: Payer: Self-pay | Admitting: Pulmonary Disease

## 2022-03-19 ENCOUNTER — Encounter (HOSPITAL_COMMUNITY)
Admission: RE | Admit: 2022-03-19 | Discharge: 2022-03-19 | Disposition: A | Discharge status: Home / Self Care | Payer: Commercial Managed Care - PPO | Source: Ambulatory Visit | Attending: Pulmonary Disease | Admitting: Pulmonary Disease

## 2022-03-19 VITALS — Wt 173.1 lb

## 2022-03-19 DIAGNOSIS — J84112 Idiopathic pulmonary fibrosis: Secondary | ICD-10-CM

## 2022-03-19 NOTE — Telephone Encounter (Signed)
Called and spoke with [facility] from pulmonary rehab. Stanton Kidney stated that when they did the walk test on pt, he did not desat to where he required oxygen.  Stanton Kidney however stated that when pt was walking on track for exercise, which they walk for 20mn while walking on the track, he did drop to 83%.  MStanton Kidneywants to know if another walk test should be done to see if pt might drop this time if another walk test is done or what might need to be recommended.  Dr. OJenetta Downer please advise on this.

## 2022-03-19 NOTE — Progress Notes (Signed)
Daily Session Note  Patient Details  Name: Richard Davenport MRN: [id number] Date of Birth: 07/22/1960 Referring Provider:   April Manson Pulmonary Rehab Walk Test from 02/18/2022 in [facility] for Heart, Vascular, & [place]  Referring Provider [place]       Encounter Date: 03/19/2022  Check In:  Session Check In - 03/19/22 1113       Check-In   Supervising physician immediately available to respond to emergencies [facility] MD immediately available    Physician(s) Melina Copa, PA    Location [facility]    Staff Present Janine Ores, RN, BSN;Randi Olen Cordial BS, ACSM-CEP, Exercise Physiologist;Kaylee Rosana Hoes, MS, ACSM-CEP, Exercise Physiologist;Samantha [place], RD, LDN;Other    Virtual Visit No    Medication changes reported     No    Fall or balance concerns reported    No    Tobacco Cessation No Change    Warm-up and Cool-down Performed as group-led instruction    Resistance Training Performed Yes    VAD Patient? No    PAD/SET Patient? No      Pain Assessment   Currently in Pain? No/denies    Multiple Pain Sites No             Capillary Blood Glucose: No results found for this or any previous visit (from the past 24 hour(s)).   Exercise Prescription Changes - 03/19/22 1200       Response to Exercise   Blood Pressure (Admit) 128/64    Blood Pressure (Exercise) 106/60    Blood Pressure (Exit) 108/60    Heart Rate (Admit) 78 bpm    Heart Rate (Exercise) 95 bpm    Heart Rate (Exit) 77 bpm    Oxygen Saturation (Admit) 90 %    Oxygen Saturation (Exercise) 95 %    Oxygen Saturation (Exit) 96 %    Rating of Perceived Exertion (Exercise) 9    Perceived Dyspnea (Exercise) 0    Duration Continue with 30 min of aerobic exercise without signs/symptoms of physical distress.    Intensity THRR unchanged      Progression   Progression Continue to progress workloads to maintain intensity without signs/symptoms of physical distress.       Resistance Training   Training Prescription Yes    Weight blue bands    Reps 10-15      Oxygen   Oxygen Continuous    Liters 3      NuStep   Level 2    Minutes 15    METs 2.7      Track   Laps 9    Minutes 15    METs 2.38      Oxygen   Maintain Oxygen Saturation 88% or higher             Social History   Tobacco Use  Smoking Status Never  Smokeless Tobacco Never    Goals Met:  Independence with exercise equipment Exercise tolerated well No report of concerns or symptoms today Strength training completed today  Goals Unmet:  Not Applicable  Comments: Service time is from 1004 to 1155    Dr. Rodman Pickle is Medical Director for Pulmonary Rehab at [facility].

## 2022-03-19 NOTE — Telephone Encounter (Signed)
Pls call [facility] regarding this PT.  Nurse Stanton Kidney from Pulmonary Rehab [phone] or Direct [phone]

## 2022-03-20 NOTE — Telephone Encounter (Signed)
Patient is scheduled to see MR on 03/05. Proper walk test can be done during the visit.

## 2022-03-20 NOTE — Telephone Encounter (Signed)
If the information from pulm rehab can be used as the tool to prescribe the oxygen, I am for that or patient may be scheduled in the office for a walk

## 2022-03-21 ENCOUNTER — Encounter (HOSPITAL_COMMUNITY): Payer: Commercial Managed Care - PPO

## 2022-03-21 ENCOUNTER — Ambulatory Visit (HOSPITAL_COMMUNITY): Payer: Commercial Managed Care - PPO

## 2022-03-21 ENCOUNTER — Other Ambulatory Visit (HOSPITAL_COMMUNITY): Payer: Self-pay

## 2022-03-26 ENCOUNTER — Ambulatory Visit (HOSPITAL_COMMUNITY): Payer: Commercial Managed Care - PPO

## 2022-03-26 ENCOUNTER — Encounter (HOSPITAL_COMMUNITY): Payer: Commercial Managed Care - PPO

## 2022-03-26 ENCOUNTER — Encounter: Payer: Self-pay | Admitting: Internal Medicine

## 2022-03-26 ENCOUNTER — Ambulatory Visit (INDEPENDENT_AMBULATORY_CARE_PROVIDER_SITE_OTHER): Payer: Commercial Managed Care - PPO | Admitting: Internal Medicine

## 2022-03-26 ENCOUNTER — Telehealth: Payer: Self-pay | Admitting: Internal Medicine

## 2022-03-26 VITALS — BP 120/70 | HR 88 | Ht 68.0 in | Wt 172.4 lb

## 2022-03-26 DIAGNOSIS — D84821 Immunodeficiency due to drugs: Secondary | ICD-10-CM

## 2022-03-26 DIAGNOSIS — Z7960 Long term (current) use of unspecified immunomodulators and immunosuppressants: Secondary | ICD-10-CM

## 2022-03-26 DIAGNOSIS — J849 Interstitial pulmonary disease, unspecified: Secondary | ICD-10-CM

## 2022-03-26 DIAGNOSIS — R932 Abnormal findings on diagnostic imaging of liver and biliary tract: Secondary | ICD-10-CM

## 2022-03-26 DIAGNOSIS — Z79899 Other long term (current) drug therapy: Secondary | ICD-10-CM

## 2022-03-26 DIAGNOSIS — Z87898 Personal history of other specified conditions: Secondary | ICD-10-CM

## 2022-03-26 DIAGNOSIS — Z5181 Encounter for therapeutic drug level monitoring: Secondary | ICD-10-CM

## 2022-03-26 DIAGNOSIS — R5383 Other fatigue: Secondary | ICD-10-CM | POA: Diagnosis not present

## 2022-03-26 LAB — CBC WITH DIFFERENTIAL/PLATELET
Basophils Absolute: 0 10*3/uL (ref 0.0–0.1)
Basophils Relative: 0.6 % (ref 0.0–3.0)
Eosinophils Absolute: 0.3 10*3/uL (ref 0.0–0.7)
Eosinophils Relative: 5.5 % — ABNORMAL HIGH (ref 0.0–5.0)
HCT: 31.7 % — ABNORMAL LOW (ref 39.0–52.0)
Hemoglobin: 10.8 g/dL — ABNORMAL LOW (ref 13.0–17.0)
Lymphocytes Relative: 15.1 % (ref 12.0–46.0)
Lymphs Abs: 0.9 10*3/uL (ref 0.7–4.0)
MCHC: 34.1 g/dL (ref 30.0–36.0)
MCV: 114.5 fl — ABNORMAL HIGH (ref 78.0–100.0)
Monocytes Absolute: 0.3 10*3/uL (ref 0.1–1.0)
Monocytes Relative: 5.4 % (ref 3.0–12.0)
Neutro Abs: 4.6 10*3/uL (ref 1.4–7.7)
Neutrophils Relative %: 73.4 % (ref 43.0–77.0)
Platelets: 115 10*3/uL — ABNORMAL LOW (ref 150.0–400.0)
RBC: 2.77 Mil/uL — ABNORMAL LOW (ref 4.22–5.81)
RDW: 14.9 % (ref 11.5–15.5)
WBC: 6.3 10*3/uL (ref 4.0–10.5)

## 2022-03-26 LAB — HEPATIC FUNCTION PANEL
ALT: 25 U/L (ref 0–53)
AST: 38 U/L — ABNORMAL HIGH (ref 0–37)
Albumin: 2.3 g/dL — ABNORMAL LOW (ref 3.5–5.2)
Alkaline Phosphatase: 175 U/L — ABNORMAL HIGH (ref 39–117)
Bilirubin, Direct: 0 mg/dL (ref 0.0–0.3)
Total Bilirubin: 0.5 mg/dL (ref 0.2–1.2)
Total Protein: 7.5 g/dL (ref 6.0–8.3)

## 2022-03-26 NOTE — Progress Notes (Signed)
OV 03/26/2022 -transferred to the ILD center with Dr. Chase Caller by Dr. Jenetta Downer.  Patient will "be comanaged with [facility] Dr. Almyra Free fried  Subjective:  Patient ID: Richard Davenport, male , DOB: 1960/12/05 , age 62 y.o. , MRN: [id number] , ADDRESS: [place] [id number] PCP Pcp, No Patient Care Team: Pcp, No as PCP - General  This Provider for this visit: Treatment Team:  Attending Provider: Brand Males, MD    03/26/2022 -   Chief Complaint  Patient presents with   Consult    ILD     HPI Richard Davenport 62 y.o. -I am meeting him for the first time.  His daughter Richard Davenport is with him and she is the main independent historian.  He is giving some of the history.  He is originally from [place].  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  He initially saw Dr. Jenetta Downer in our office and was diagnosed with ILD and subsequently referred to the [facility] ILD center.  He that he saw Dr. Almyra Free fried.  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  He has been started on low-dose Esbriet protocol and also CellCept.  He started pirfenidone/Esbriet in January 2024 and CellCept around mid February 2024.  Richard Davenport is tolerating these medicines well.  He has been attending pulm rehabilitation but he is desaturating the significantly and the need portable oxygen.  The daughter also states that extremely fatigued after rehab despite oxygen use at rehab.  She wants to pause rehabilitation for the moment.  As part of his ILD workup incidental diagnose of cirrhosis been made in the CT scan.  His mother had NASH.  He does not have any jaundice apparently hepatitis panel is negative.  He has liver clinic appointment at [facility].  He has not done the ILD questionnaire with [place].  His current symptom score is as below  They are going to see [facility] only every 6 months they want more frequent follow-up.  I offered him every 6 months with [place] such that he is  seeing 1 of [place] every 3 months.  He and his daughter are happy with that plan.  He is getting his labs safety monitoring supervised by Jarrett Soho daughter wants me to place an order to get it done here every 2 weeks and Dr. Almyra Free freed will then monitor his labs.  He has been considered too early for lung transplantation.    SYMPTOM SCALE - ILD 03/26/2022  Current weight   O2 use ra  Shortness of Breath 0 -> 5 scale with 5 being worst (score 6 If unable to do)  At rest 1.5  Simple tasks - showers, clothes change, eating, shaving 3  Household (dishes, doing bed, laundry) 4  Shopping 3  Walking level at own pace 3  Walking up Stairs 4  Total (30-36) Dyspnea Score 18.5  How bad is your cough? 3  How bad is your fatigue Tires esp after rehab  How bad is nausea 0  How bad is vomiting?  0  How bad is diarrhea? 0  How bad is anxiety? x  How bad is depression x  Any chronic pain - if so where and how bad x       Simple office walk 185 feet x  3 laps goal with forehead probe 03/26/2022    O2 used ra   Number laps completed 1-2  laps out of 3   Comments about pace x   Resting Pulse Ox/HR 98% and 86/min   Final Pulse Ox/HR 86% and 104/min   Desaturated </= 88% yes   Desaturated <= 3% points yes   Got Tachycardic >/= 90/min 12 pints   Symptoms at end of test x   Miscellaneous comments Needed 3L to correct      Lab test - At [facility] 01/24/2022 anti-PL-total antibody weakly positive   CT Chest data - HRCT 01/25/22  Narrative & Impression  CLINICAL DATA:  Idiopathic pulmonary fibrosis   EXAM: CT CHEST WITHOUT CONTRAST   TECHNIQUE: Multidetector CT imaging of the chest was performed following the standard protocol without intravenous contrast. High resolution imaging of the lungs, as well as inspiratory and expiratory imaging, was performed.   RADIATION DOSE REDUCTION: This exam was performed according to the departmental dose-optimization program which includes  automated exposure control, adjustment of the mA and/or kV according to patient size and/or use of iterative reconstruction technique.   COMPARISON:  CT abdomen pelvis, 07/09/2015   FINDINGS: Cardiovascular: No significant vascular findings. Normal heart size. No pericardial effusion.   Mediastinum/Nodes: Prominent subcentimeter mediastinal and hilar lymph nodes. Thyroid gland, trachea, and esophagus demonstrate no significant findings.   Lungs/Pleura: Moderate pulmonary fibrosis in a pattern with apical to basal gradient, featuring irregular peripheral interstitial opacity, septal thickening, traction bronchiectasis, subpleural bronchiolectasis, and areas of honeycombing at the lung bases. Fibrotic findings are markedly worsened in comparison to prior imaging of the abdomen and pelvis dated 07/09/2015. No significant air trapping on expiratory phase imaging. No pleural effusion or pneumothorax.   Upper Abdomen: Coarse, nodular contour of the liver. Partially imaged splenomegaly. Small volume perihepatic and perisplenic ascites.   Musculoskeletal: No chest wall abnormality. No acute osseous findings.   IMPRESSION: 1. Moderate pulmonary fibrosis in a pattern with apical to basal gradient, featuring irregular peripheral interstitial opacity, septal thickening, traction bronchiectasis, subpleural bronchiolectasis, and areas of honeycombing at the lung bases. Fibrotic findings are markedly worsened in comparison to prior imaging of the abdomen and pelvis dated 07/09/2015. Findings are consistent with UIP per consensus guidelines: Diagnosis of Idiopathic Pulmonary Fibrosis: An Official ATS/ERS/JRS/ALAT Clinical Practice Guideline. [place], Iss 5, [phone], Sep 21 2016. 2. Prominent subcentimeter mediastinal and hilar lymph nodes, likely reactive to fibrosis. 3. Cirrhosis, partially imaged splenomegaly, and ascites in the included upper abdomen.      Electronically Signed   By: Delanna Ahmadi M.D.   On: 01/28/2022 13:06        Latest Reference Range & Units 10/08/12 21:31 10/08/12 21:44 10/11/12 05:31 10/12/12 06:50 10/13/12 04:10 10/14/12 04:35 10/14/12 17:30 10/15/12 05:48 10/16/12 05:00 10/17/12 04:15 10/18/12 05:55 10/19/12 03:50 10/20/12 04:10 10/22/12 04:30 10/23/12 03:40 10/23/12 15:18 10/28/12 07:05 04/05/13 13:21 05/21/13 15:41 08/26/13 17:10 08/26/13 17:20 05/27/14 12:30 07/09/15 17:02 12/14/17 17:26 04/08/18 20:09 04/08/18 20:10 01/04/22 11:59 03/11/22 12:11  Creatinine 0.40 - 1.50 mg/dL 1.04 1.00 1.05 0.99 1.14 0.95 0.95 0.97 1.01 0.97 0.90 0.78 0.82 0.75 0.75 0.77 0.97 0.96 1.09 1.17 1.40 (H) 0.99 0.95 1.11 1.07 1.00 1.15 1.04  (H): Data is abnormally high  No results found.  ECHO 01/24/22  IMPRESSIONS     1. Left ventricular ejection fraction, by estimation, is 60 to 65%. Left  ventricular ejection fraction by 3D volume is 60 %. The left ventricle has  normal function. The left ventricle has no regional wall motion  abnormalities. Left ventricular diastolic  parameters were normal. The average left ventricular global longitudinal  strain is -25.9 %. The global longitudinal strain is normal.   2. Right ventricular systolic function is normal. The right ventricular  size is normal. There is normal pulmonary artery systolic pressure. The  estimated right ventricular systolic pressure is [id number] mmHg.   3. The mitral valve is normal in structure. Trivial mitral valve  regurgitation. No evidence of mitral stenosis.   4. The aortic valve is tricuspid. Aortic valve regurgitation is not  visualized. Aortic valve sclerosis/calcification is present, without any  evidence of aortic stenosis.   5. There is mild dilatation of the ascending aorta, measuring 39 mm.   6. The inferior vena cava is normal in size with greater than 50%  respiratory variability, suggesting right atrial pressure of 3 mmHg.   PFT     Latest Ref Rng &  Units 01/04/2022    9:31 AM  PFT Results  FVC-Pre L 2.96   FVC-Predicted Pre % 65   FVC-Post L 2.95   FVC-Predicted Post % 64   Pre FEV1/FVC % % 83   Post FEV1/FCV % % 86   FEV1-Pre L 2.47   FEV1-Predicted Pre % 71   FEV1-Post L 2.54   DLCO uncorrected ml/min/mmHg 10.60   DLCO UNC% % 39   DLCO corrected ml/min/mmHg 10.60   DLCO COR %Predicted % 39   DLVA Predicted % 64     Latest Reference Range & Units 10/08/12 21:31 10/28/12 07:05 08/26/13 17:10 07/09/15 17:02 12/14/17 17:26 04/08/18 20:09 01/04/22 11:59 02/18/22 10:21 03/11/22 12:11  Albumin 3.5 - 5.2 g/dL 4.0 3.2 (L) 3.9 3.4 (L) 3.7 3.3 (L) 2.7 (L) 2.5 (L) 2.4 (L)  Albumin/Globulin Ratio 1.2 - 2.2        0.5 (L)    AST 0 - 37 U/L '22 19 26 30 '$ 36 35 57 (H) 42 (H) 38 (H)  ALT 0 - 53 U/L '19 26 20 27 '$ 32 26 39 30 24  (L): Data is abnormally low (H): Data is abnormally high   Latest Reference Range & Units 10/08/12 21:31 10/08/12 21:44 10/11/12 05:31 10/12/12 06:50 10/13/12 04:10 10/14/12 04:35 10/16/12 05:00 10/17/12 04:15 10/18/12 05:55 10/19/12 03:50 10/20/12 04:10 10/22/12 04:30 10/23/12 03:40 10/28/12 07:05 04/05/13 13:21 05/21/13 15:41 08/26/13 17:10 08/26/13 17:20 05/27/14 12:30 07/09/15 17:02 12/14/17 17:26 04/08/18 20:09 01/04/22 11:59 03/11/22 12:11  Hemoglobin 13.0 - 17.0 g/dL 15.3 15.3 12.6 (L) 13.4 13.3 14.9 14.3 13.7 13.1 13.2 13.3 13.1 12.5 (L) 12.6 (L) 15.0 14.7 14.7 16.0 14.7 13.7 14.3 12.9 (L) 11.2 (L) 11.2 (L)  (L): Data is abnormally low     Latest Reference Range & Units 01/04/22 11:59  BNP 0.0 - 100.0 pg/mL 156.8 (H)  (H): Data is abnormally high Latest Reference Range & Units 12/26/21 10:41  Anti Nuclear Antibody (ANA) NEGATIVE  NEGATIVE  ANCA SCREEN Negative  C-ANCA POS !  Cyclic Citrullin Peptide Ab UNITS <16  RA Latex Turbid. <14 IU/mL <14  ANA,IFA RA DIAG PNL W/RFLX TIT/PATN  Rpt  Anti-Jo-1 Ab (RDL) <20 Units <20  Anti-PL-7 Ab (RDL) Negative  Weak Positive !  Anti-PL-12 Ab (RDL) Negative  Negative   Anti-EJ Ab (RDL) Negative  Negative  Anti-OJ Ab (RDL) Negative  Negative  Anti-SRP Ab (RDL) Negative  Negative  Anti-Mi-2 Ab (RDL) Negative  Negative  Anti-TIF-1gamma Ab (RDL) <20 Units <20  Anti-MDA-5 Ab (CADM-140)(RDL) <20 Units <20  Anti-NXP-2 (P140) Ab (RDL) <20 Units <20  Anti-SAE1 Ab, IgG (RDL) <20  Units <20  Anti-PM/Scl-100 Ab (RDL) <20 Units <20  Anti-Ku Ab (RDL) Negative  Negative  Anti-SS-A 52kD Ab, IgG (RDL) <20 Units <20  Anti-U1 RNP Ab (RDL) <20 Units <20  Anti-U2 RNP Ab (RDL) Negative  Negative  Anti-U3 RNP (Fibrillarin)(RDL) Negative  Negative  SSA (Ro) (ENA) Antibody, IgG <1.0 NEG AI <1.0 NEG  SSB (La) (ENA) Antibody, IgG <1.0 NEG AI <1.0 NEG  Scleroderma (Scl-70) (ENA) Antibody, IgG <1.0 NEG AI <1.0 NEG    has a past medical history of Diabetes mellitus without complication ([place]), Headache, Idiopathic pulmonary fibrosis ([place]), Stroke ([place]), and Subarachnoid hemorrhage ([place]).   reports that he has never smoked. He has never used smokeless tobacco.  Past Surgical History:  Procedure Laterality Date   ANEURYSM COILING     for bleed   LOOP RECORDER INSERTION N/A 04/10/2018   Procedure: LOOP RECORDER INSERTION;  Surgeon: Deboraha Sprang, MD;  Location: [facility];  Service: Cardiovascular;  Laterality: N/A;   RADIOLOGY WITH ANESTHESIA N/A 10/09/2012   Procedure: RADIOLOGY WITH ANESTHESIA;  Surgeon: Consuella Lose, MD;  Location: [place];  Service: Radiology;  Laterality: N/A;   RADIOLOGY WITH ANESTHESIA N/A 05/27/2013   Procedure: RADIOLOGY WITH ANESTHESIA;  Surgeon: Consuella Lose, MD;  Location: [place];  Service: Radiology;  Laterality: N/A;    Allergies  Allergen Reactions   Pork-Derived Products Other (See Comments)    Patient is Muslim and PREFERS TO NOT TAKE ANY PORK OR MEAT PRODUCTS (only fish)    Immunization History  Administered Date(s) Administered   Influenza Whole 11/05/2021   Influenza,inj,Quad PF,6+ Mos 10/13/2012   PNEUMOCOCCAL  CONJUGATE-20 01/24/2022   Tdap 05/25/2020    Family History  Problem Relation Age of Onset   Hypertension Mother    Hypertension Father      Current Outpatient Medications:    metFORMIN (GLUCOPHAGE) 500 MG tablet, Take 1 tablet (500 mg total) by mouth 2 (two) times daily., Disp: 60 tablet, Rfl: 1   Pirfenidone 267 MG TABS, Take 3 tablets (801 mg total) by mouth with breakfast, with lunch, and with evening meal. Month 2 and onwards, Disp: 270 tablet, Rfl: 5   albuterol (VENTOLIN HFA) 108 (90 Base) MCG/ACT inhaler, Inhale 2 puffs into the lungs every 6 (six) hours as needed for wheezing or shortness of breath., Disp: 8 g, Rfl: 6   aspirin EC 81 MG EC tablet, Take 1 tablet (81 mg total) by mouth daily. (Patient not taking: Reported on 12/26/2021), Disp: , Rfl:    benzonatate (TESSALON) 200 MG capsule, Take 1 capsule (200 mg total) by mouth 3 (three) times daily as needed for cough. (Patient not taking: Reported on 03/26/2022), Disp: 90 capsule, Rfl: 1   budesonide-formoterol (SYMBICORT) 160-4.5 MCG/ACT inhaler, Inhale 2 puffs into the lungs 2 (two) times daily. (Patient not taking: Reported on 03/26/2022), Disp: 1 each, Rfl: 5      Objective:   Vitals:   03/26/22 0909  BP: 120/70  Pulse: 88  SpO2: (!) 86%  Weight: 172 lb 6.4 oz (78.2 kg)  Height: '5\' 8"'$  (1.727 m)    Estimated body mass index is 26.21 kg/m as calculated from the following:   Height as of this encounter: '5\' 8"'$  (1.727 m).   Weight as of this encounter: 172 lb 6.4 oz (78.2 kg).  '@WEIGHTCHANGE'$ @  [facility] Weights   03/26/22 0909  Weight: 172 lb 6.4 oz (78.2 kg)     Physical Exam    General: No distress. Looks think  and mildly deocndtioned but stable Neuro: Alert and Oriented x 3. GCS 15. Speech normal Psych: Pleasant Resp:  Barrel Chest - no.  Wheeze - no, Crackles - YES BASE, No overt respiratory distress CVS: Normal heart sounds. Murmurs - no Ext: Stigmata of Connective Tissue Disease - NO but has  CLUBBING HEENT: Normal upper airway. PEERL +. No post nasal drip        Assessment:       ICD-10-CM   1. ILD (interstitial lung disease) (HCC)  J84.9 CBC with Differential/Platelet    IgE    Resp Allergy Profile Regn2DC DE MD [place]    QuantiFERON-TB Gold Plus    Pulse oximetry, overnight    Hepatic function panel    Acetylcholine receptor, binding    Striated muscle antibody    Resp Allergy Profile Regn2DC DE MD [place] [facility]    IgE    Ambulatory Referral for DME    2. Other fatigue  R53.83 CBC with Differential/Platelet    IgE    Resp Allergy Profile Regn2DC DE MD [place] [facility]    QuantiFERON-TB Gold Plus    Pulse oximetry, overnight    Hepatic function panel    Acetylcholine receptor, binding    Striated muscle antibody    Resp Allergy Profile Regn2DC DE MD [place] [facility]    IgE    3. Encounter for therapeutic drug monitoring  Z51.81 CBC with Differential/Platelet    IgE    Resp Allergy Profile Regn2DC DE MD [place] [facility]    QuantiFERON-TB Gold Plus    Pulse oximetry, overnight    Hepatic function panel    Acetylcholine receptor, binding    Striated muscle antibody    Resp Allergy Profile Regn2DC DE MD [place] [facility]    IgE    4. High risk medication use  Z79.899 CBC with Differential/Platelet    IgE    Resp Allergy Profile Regn2DC DE MD [place] [facility]    QuantiFERON-TB Gold Plus    Pulse oximetry, overnight    Hepatic function panel    Acetylcholine receptor, binding    Striated muscle antibody    Resp Allergy Profile Regn2DC DE MD [place]    IgE    5. Encounter for monitoring immunosuppressive medication therapy causing immunodeficiency ([place])  D84.821 CBC with Differential/Platelet   Z51.81 IgE   Z79.60 Resp Allergy Profile Regn2DC DE MD [place] [facility]    QuantiFERON-TB Gold Plus    Pulse oximetry, overnight    Hepatic function panel    Acetylcholine receptor, binding    Striated muscle antibody    Resp Allergy Profile Regn2DC DE MD [place]    IgE    6. Abnormal liver CT  R93.2 CBC with Differential/Platelet     IgE    Resp Allergy Profile Regn2DC DE MD [place] [facility]    QuantiFERON-TB Gold Plus    Pulse oximetry, overnight    Hepatic function panel    Acetylcholine receptor, binding    Striated muscle antibody    Resp Allergy Profile Regn2DC DE MD [place] [facility]    IgE    7. History of wheezing  Z87.898 CBC with Differential/Platelet    IgE    Resp Allergy Profile Regn2DC DE MD [place]    QuantiFERON-TB Gold Plus    Pulse oximetry, overnight    Hepatic function panel    Acetylcholine receptor, binding    Striated muscle antibody    Resp Allergy Profile Regn2DC [place]  [place]    IgE  Plan:     Patient Instructions     ICD-10-CM   1. ILD (interstitial lung disease) ([place])  J84.9     2. Other fatigue  R53.83     3. Encounter for therapeutic drug monitoring  Z51.81     4. High risk medication use  Z79.899     5. Encounter for monitoring immunosuppressive medication therapy causing immunodeficiency ([place])  D84.821    Z51.81    Z79.60      ILD (interstitial lung disease) ([place]) Encounter for therapeutic drug monitoring High risk medication use Encounter for monitoring immunosuppressive medication therapy causing immunodeficiency ([place])  - glad you are registered with Dr Vonita Moss at [facility] and on Rx with low dose esbreit and cellcept  Plan  - Continue Esbriet and CellCept according to directions from [place] will do standing order for CBC, liver function test and chemistry every 2 weeks for the next 3 months  -You can come to our lab and get it done - for now PFT monitporing at [place]  - can consider clinical trials in the future - take note today to excuse you from work 03/26/2022   Coughing and wheezing  Plan - Check CBC with differential, blood IgE and RAST allergy panel and QuantiFERON gold today  Other fatigue -   -I am a little puzzled that pulmonary rehabilitation is making you more fatigued.  This could be because of potential neuromuscular issues versus active  disease state versus medication versus other causes such as low oxygen state  Plan - Do simple walking desaturation test today in our office -> if this is normal we will have to do a 6-minute walk test and a longer oxygen titration test - Do acetylcholine receptor antibody test - Do overnight pulse oximetry -Talk to primary care physician make sure thyroid, vitamin D and blood sugar status are all normal -Okay to hold off pulmonary rehabilitation for the moment    Abnormal liver CT  - keep appt at [facility] for cirrhosis workup - at some point can move to Gi here in [place]  Followup -   - 3 months - DR Chase Caller - 30 min visit  - symptom score and simple wak test      ( Level 05 visit: Estb 40-54 min  in  visit type: on-site physical face to visit  in total care time and counseling or/and coordination of care by this undersigned MD - Dr Brand Males. This includes one or more of the following on this same day 03/26/2022: pre-charting, chart review, note writing, documentation discussion of test results, diagnostic or treatment recommendations, prognosis, risks and benefits of management options, instructions, education, compliance or risk-factor reduction. It excludes time spent by the [facility] or office staff in the care of the patient. Actual time 50 min)    SIGNATURE    Dr. Brand Males, M.D., F.C.C.P,  Pulmonary and Critical Care Medicine Staff Physician, [place] Director - Interstitial Lung Disease  Program  Pulmonary [place] at [place], [place], [address]  Pager: [phone], If no answer or between  15:00h - 7:00h: call [phone] Telephone: [phone]  7:24 PM 03/26/2022

## 2022-03-26 NOTE — Patient Instructions (Addendum)
ICD-10-CM   1. ILD (interstitial lung disease) ([place])  J84.9     2. Other fatigue  R53.83     3. Encounter for therapeutic drug monitoring  Z51.81     4. High risk medication use  Z79.899     5. Encounter for monitoring immunosuppressive medication therapy causing immunodeficiency ([place])  D84.821    Z51.81    Z79.60      ILD (interstitial lung disease) ([place]) Encounter for therapeutic drug monitoring High risk medication use Encounter for monitoring immunosuppressive medication therapy causing immunodeficiency ([place])  - glad you are registered with Dr Vonita Moss at [facility] and on Rx with low dose esbreit and cellcept  Plan  - Continue Esbriet and CellCept according to directions from [place] will do standing order for CBC, liver function test and chemistry every 2 weeks for the next 3 months  -You can come to our lab and get it done - for now PFT monitporing at [place]  - can consider clinical trials in the future - take note today to excuse you from work 03/26/2022   Coughing and wheezing  Plan - Check CBC with differential, blood IgE and RAST allergy panel and QuantiFERON gold today  Other fatigue -   -I am a little puzzled that pulmonary rehabilitation is making you more fatigued.  This could be because of potential neuromuscular issues versus active disease state versus medication versus other causes such as low oxygen state  Plan - Do simple walking desaturation test today in our office -> if this is normal we will have to do a 6-minute walk test and a longer oxygen titration test - Do acetylcholine receptor antibody test - Do overnight pulse oximetry -Talk to primary care physician make sure thyroid, vitamin D and blood sugar status are all normal -Okay to hold off pulmonary rehabilitation for the moment    Abnormal liver CT  - keep appt at [facility] for cirrhosis workup - at some point can move to Gi here in [place]  Followup -   - 3 months - DR  Chase Caller - 30 min visit [will change follow-up to 6-8 weeks; decision made after he left]  - symptom score and simple wak test  -Administer ILD question at next visit

## 2022-03-26 NOTE — Telephone Encounter (Signed)
    Richard Davenport  That his liver enzymes AST is slightly high but it is stable - Hemoglobin is 10.8 g% and stable slightly anemic - Platelets slightly low at 115 K but stable  Plan - Await repeat labs in 2 weeks [please make sure there is an order for CBC, [place] and LFTs every 2 weeks for the next 2 months] - Please change follow-up to 6/- 8 weeks to see me ideally within 6 weeks -especially because he started Esbriet and pirfenidone -> if he cannot see me then he can see nurse practitioner -Keep 12-week appointment with me  LABS    PULMONARY No results for input(s): "PHART", "PCO2ART", "PO2ART", "HCO3", "TCO2", "O2SAT" in the last 168 hours.  Invalid input(s): "PCO2", "PO2"  CBC Recent Labs  Lab 03/26/22 1015  HGB 10.8*  HCT 31.7*  WBC 6.3  PLT 115.0*    COAGULATION No results for input(s): "INR" in the last 168 hours.  CARDIAC  No results for input(s): "TROPONINI" in the last 168 hours. No results for input(s): "PROBNP" in the last 168 hours.   CHEMISTRY No results for input(s): "NA", "K", "CL", "CO2", "GLUCOSE", "BUN", "CREATININE", "CALCIUM", "MG", "PHOS" in the last 168 hours. Estimated Creatinine Clearance: 74 mL/min (by C-G formula based on SCr of 1.04 mg/dL).   LIVER Recent Labs  Lab 03/26/22 1015  AST 38*  ALT 25  ALKPHOS 175*  BILITOT 0.5  PROT 7.5  ALBUMIN 2.3*     INFECTIOUS No results for input(s): "LATICACIDVEN", "PROCALCITON" in the last 168 hours.   ENDOCRINE CBG (last 3)  No results for input(s): "GLUCAP" in the last 72 hours.       IMAGING x48h  - image(s) personally visualized  -   highlighted in bold No results found.

## 2022-03-27 ENCOUNTER — Telehealth (HOSPITAL_COMMUNITY): Payer: Self-pay

## 2022-03-27 ENCOUNTER — Telehealth: Payer: Self-pay | Admitting: Internal Medicine

## 2022-03-27 DIAGNOSIS — Z87898 Personal history of other specified conditions: Secondary | ICD-10-CM

## 2022-03-27 DIAGNOSIS — R768 Other specified abnormal immunological findings in serum: Secondary | ICD-10-CM

## 2022-03-27 DIAGNOSIS — R898 Other abnormal findings in specimens from other organs, systems and tissues: Secondary | ICD-10-CM

## 2022-03-27 NOTE — Telephone Encounter (Signed)
Pt called and check on since pt missed PR on 3/5. Spoke with pts daughter and she stated that Dr. Chase Caller recommended he take a break from PR due to increased SOB since starting Esbriet.

## 2022-03-27 NOTE — Telephone Encounter (Signed)
Called and left message for [employer] [phone].

## 2022-03-27 NOTE — Telephone Encounter (Signed)
Requesting Clinical notes for PT

## 2022-03-27 NOTE — Telephone Encounter (Signed)
Called and spoke with pt's daughter letting her know all info per MR and also scheduled pt a f;/u with Beth in 6 weeks. Nothing further needed.

## 2022-03-27 NOTE — Telephone Encounter (Signed)
Blood IgE very hight Blood eos also high RAST allergy panel  pending  Plan  -refer allergist

## 2022-03-28 ENCOUNTER — Encounter (HOSPITAL_COMMUNITY): Payer: Commercial Managed Care - PPO

## 2022-03-28 ENCOUNTER — Ambulatory Visit (HOSPITAL_COMMUNITY): Payer: Commercial Managed Care - PPO

## 2022-03-28 LAB — RESPIRATORY ALLERGY PROFILE REGION II ~~LOC~~
Allergen, A. alternata, m6: <0.1 kU/L
Allergen, Cedar tree, t12: <0.1 kU/L
Allergen, Comm Silver Birch, t9: <0.1 kU/L
Allergen, Cottonwood, t14: 0.13 kU/L — ABNORMAL HIGH
Allergen, D pternoyssinus,d7: 0.32 kU/L — ABNORMAL HIGH
Allergen, Mouse Urine Protein, e78: <0.1 kU/L
Allergen, Mulberry, t76: <0.1 kU/L
Allergen, Oak,t7: <0.1 kU/L
Allergen, P. notatum, m1: <0.1 kU/L
Aspergillus fumigatus, m3: <0.1 kU/L
Bermuda Grass: 0.1 kU/L — ABNORMAL HIGH
Box Elder IgE: <0.1 kU/L
CLADOSPORIUM HERBARUM (M2) IGE: <0.1 kU/L
COMMON RAGWEED (SHORT) (W1) IGE: <0.1 kU/L
Cat Dander: <0.1 kU/L
Class: 0
Class: 0
Class: 0
Class: 0
Class: 0
Class: 0
Class: 0
Class: 0
Class: 0
Class: 0
Class: 0
Class: 0
Class: 0
Class: 0
Class: 0
Class: 0
Class: 0
Class: 0
Cockroach: 0.16 kU/L — ABNORMAL HIGH
D. farinae: 0.3 kU/L — ABNORMAL HIGH
Dog Dander: 0.11 kU/L — ABNORMAL HIGH
Elm IgE: <0.1 kU/L
IgE (Immunoglobulin E), Serum: 2027 kU/L — ABNORMAL HIGH (ref <=114)
Johnson Grass: <0.1 kU/L
Pecan/Hickory Tree IgE: <0.1 kU/L
Rough Pigweed  IgE: <0.1 kU/L
Sheep Sorrel IgE: <0.1 kU/L
Timothy Grass: <0.1 kU/L

## 2022-03-28 LAB — DOG DANDER COMPONENT
Can f 4(e229) IgE: <0.1 kU/L (ref <0.10)
Can f 6(e230) IgE: <0.1 kU/L (ref <0.10)
E101-IgE Can f 1: <0.1 kU/L (ref <0.10)
E102-IgE Can f 2: <0.1 kU/L (ref <0.10)
E221-IgE Can f 3: <0.1 kU/L (ref <0.10)
E226-IgE Can f 5: <0.1 kU/L (ref <0.10)

## 2022-03-28 LAB — INTERPRETATION:

## 2022-03-29 ENCOUNTER — Telehealth (HOSPITAL_COMMUNITY): Payer: Self-pay

## 2022-03-29 LAB — QUANTIFERON-TB GOLD PLUS
Mitogen-NIL: 2.37 IU/mL
NIL: 0.03 IU/mL
QuantiFERON-TB Gold Plus: NEGATIVE
TB1-NIL: 0 IU/mL
TB2-NIL: 0 IU/mL

## 2022-03-29 NOTE — Telephone Encounter (Signed)
Called pt's daughter. [name] daughter mentioned how both saw Dr. Chase Caller and Dr. Chase Caller said to take a pause from Pulmonary Rehab. I discussed discharging from the program with pt's daughter since they were unsure how long this break would be. [name] daughter was in agreement to discharge Richard Davenport from the program. I told pt's daughter to ask for a new referral for Pulmonary Rehab. She voiced understanding.

## 2022-04-01 NOTE — Progress Notes (Signed)
Discharge Progress Report  Patient Details  Name: Richard Davenport MRN: [id number] Date of Birth: 1960/09/27 Referring Provider:   April Manson Pulmonary Rehab Walk Test from 02/18/2022 in [facility] [place]  Referring Provider [place]        Number of Visits: 3  Reason for Discharge:  Early Exit:  Lack of attendance  Smoking History:  Social History   Tobacco Use  Smoking Status Never  Smokeless Tobacco Never    Diagnosis:  IPF (idiopathic pulmonary fibrosis) ([place])  ADL UCSD:  Pulmonary Assessment Scores     Row Name 02/18/22 1325         ADL UCSD   SOB Score total 17       CAT Score   CAT Score 9       mMRC Score   mMRC Score 2              Initial Exercise Prescription:  Initial Exercise Prescription - 02/18/22 1400       Date of Initial Exercise RX and Referring Provider   Date 02/18/22    Referring Provider [place]    Expected Discharge Date 04/25/22      Elliptical   Level 2    Speed 70    Minutes 15    METs 3      Rower   Level 2    Minutes 15    METs 3      Prescription Details   Frequency (times per week) 2    Duration Progress to 30 minutes of continuous aerobic without signs/symptoms of physical distress      Intensity   THRR 40-80% of Max Heartrate 64-129    Ratings of Perceived Exertion 11-13    Perceived Dyspnea 0-4      Progression   Progression Continue to progress workloads to maintain intensity without signs/symptoms of physical distress.      Resistance Training   Training Prescription Yes    Weight blue bands    Reps 10-15             Discharge Exercise Prescription (Final Exercise Prescription Changes):  Exercise Prescription Changes - 03/19/22 1200       Response to Exercise   Blood Pressure (Admit) 128/64    Blood Pressure (Exercise) 106/60    Blood Pressure (Exit) 108/60    Heart Rate (Admit) 78 bpm    Heart Rate (Exercise) 95 bpm    Heart Rate  (Exit) 77 bpm    Oxygen Saturation (Admit) 90 %    Oxygen Saturation (Exercise) 95 %    Oxygen Saturation (Exit) 96 %    Rating of Perceived Exertion (Exercise) 9    Perceived Dyspnea (Exercise) 0    Duration Continue with 30 min of aerobic exercise without signs/symptoms of physical distress.    Intensity THRR unchanged      Progression   Progression Continue to progress workloads to maintain intensity without signs/symptoms of physical distress.      Resistance Training   Training Prescription Yes    Weight blue bands    Reps 10-15      Oxygen   Oxygen Continuous    Liters 3      NuStep   Level 2    Minutes 15    METs 2.7      Track   Laps 9    Minutes 15    METs 2.38      Oxygen  Maintain Oxygen Saturation 88% or higher             Functional Capacity:  6 Minute Walk     Row Name 02/18/22 1419         6 Minute Walk   Phase Initial     Distance 1426 feet     Walk Time 6 minutes     # of Rest Breaks 0     MPH 2.7     METS 3.88     RPE 9     Perceived Dyspnea  0     VO2 Peak 13.6     Symptoms No     Resting HR 77 bpm     Resting BP 124/72     Resting Oxygen Saturation  97 %     Exercise Oxygen Saturation  during 6 min walk 89 %     Max Ex. HR 103 bpm     Max Ex. BP 140/78     2 Minute Post BP 140/80       Interval HR   1 Minute HR 93     2 Minute HR 100     3 Minute HR 103     4 Minute HR 101     5 Minute HR 103     6 Minute HR 100     2 Minute Post HR 76     Interval Heart Rate? Yes       Interval Oxygen   Interval Oxygen? Yes     Baseline Oxygen Saturation % 97 %     1 Minute Oxygen Saturation % 93 %     1 Minute Liters of Oxygen 0 L     2 Minute Oxygen Saturation % 90 %     2 Minute Liters of Oxygen 0 L     3 Minute Oxygen Saturation % 91 %     3 Minute Liters of Oxygen 0 L     4 Minute Oxygen Saturation % 90 %     4 Minute Liters of Oxygen 0 L     5 Minute Oxygen Saturation % 89 %     5 Minute Liters of Oxygen 0 L     6  Minute Oxygen Saturation % 90 %     6 Minute Liters of Oxygen 0 L     2 Minute Post Oxygen Saturation % 97 %     2 Minute Post Liters of Oxygen 0 L              Psychological, QOL, Others - Outcomes: PHQ 2/9:    02/18/2022    1:24 PM  Depression screen PHQ 2/9  Decreased Interest 0  Down, Depressed, Hopeless 0  PHQ - 2 Score 0  Altered sleeping 0  Tired, decreased energy 1  Change in appetite 0  Feeling bad or failure about yourself  0  Trouble concentrating 0  Moving slowly or fidgety/restless 0  Suicidal thoughts 0  PHQ-9 Score 1  Difficult doing work/chores Not difficult at all    Quality of Life:   Personal Goals: Goals established at orientation with interventions provided to work toward goal.  Personal Goals and Risk Factors at Admission - 02/18/22 1323       Core Components/Risk Factors/Patient Goals on Admission   Improve shortness of breath with ADL's Yes    Intervention Provide education, individualized exercise plan and daily activity instruction to help decrease symptoms of SOB with activities of daily  living.    Expected Outcomes Short Term: Improve cardiorespiratory fitness to achieve a reduction of symptoms when performing ADLs;Long Term: Be able to perform more ADLs without symptoms or delay the onset of symptoms    Increase knowledge of respiratory medications and ability to use respiratory devices properly  Yes    Intervention Provide education and demonstration as needed of appropriate use of medications, inhalers, and oxygen therapy.    Expected Outcomes Short Term: Achieves understanding of medications use. Understands that oxygen is a medication prescribed by physician. Demonstrates appropriate use of inhaler and oxygen therapy.;Long Term: Maintain appropriate use of medications, inhalers, and oxygen therapy.    Diabetes Yes    Intervention Provide education about signs/symptoms and action to take for hypo/hyperglycemia.;Provide education about  proper nutrition, including hydration, and aerobic/resistive exercise prescription along with prescribed medications to achieve blood glucose in normal ranges: Fasting glucose 65-99 mg/dL    Expected Outcomes Short Term: Participant verbalizes understanding of the signs/symptoms and immediate care of hyper/hypoglycemia, proper foot care and importance of medication, aerobic/resistive exercise and nutrition plan for blood glucose control.;Long Term: Attainment of HbA1C < 7%.              Personal Goals Discharge:  Goals and Risk Factor Review     Row Name 02/25/22 1413 03/27/22 1346           Core Components/Risk Factors/Patient Goals Review   Personal Goals Review Diabetes;Improve shortness of breath with ADL's;Develop more efficient breathing techniques such as purse lipped breathing and diaphragmatic breathing and practicing self-pacing with activity.;Increase knowledge of respiratory medications and ability to use respiratory devices properly. Develop more efficient breathing techniques such as purse lipped breathing and diaphragmatic breathing and practicing self-pacing with activity.;Increase knowledge of respiratory medications and ability to use respiratory devices properly.;Improve shortness of breath with ADL's      Review Boyan has not yet started PR class. We look forward to him working on and achieving his goals. We will continue to monitor and assess throughout the program. Shady has completed 3 PR classes so far. He is currently exercising on the [place] and the track. He has attended the warning signs and symptoms and beating the sedentary lifestyle classes. Kennie has been using 3L of O2 while exercising and sats have maintained 92-95%. He reported he's more SOB with PR. His pulmonologist has recommeded he take a break from PR at this time.      Expected Outcomes See Admission Goals See admission goals               Exercise Goals and Review:  Exercise Goals     Row Name  02/18/22 1321 02/26/22 1620           Exercise Goals   Increase Physical Activity Yes Yes      Intervention Provide advice, education, support and counseling about physical activity/exercise needs.;Develop an individualized exercise prescription for aerobic and resistive training based on initial evaluation findings, risk stratification, comorbidities and participant's personal goals. Provide advice, education, support and counseling about physical activity/exercise needs.;Develop an individualized exercise prescription for aerobic and resistive training based on initial evaluation findings, risk stratification, comorbidities and participant's personal goals.      Expected Outcomes Short Term: Attend rehab on a regular basis to increase amount of physical activity.;Long Term: Exercising regularly at least 3-5 days a week.;Long Term: Add in home exercise to make exercise part of routine and to increase amount of physical activity. Short Term: Attend rehab on  a regular basis to increase amount of physical activity.;Long Term: Exercising regularly at least 3-5 days a week.;Long Term: Add in home exercise to make exercise part of routine and to increase amount of physical activity.      Increase Strength and Stamina Yes Yes      Intervention Provide advice, education, support and counseling about physical activity/exercise needs.;Develop an individualized exercise prescription for aerobic and resistive training based on initial evaluation findings, risk stratification, comorbidities and participant's personal goals. Provide advice, education, support and counseling about physical activity/exercise needs.;Develop an individualized exercise prescription for aerobic and resistive training based on initial evaluation findings, risk stratification, comorbidities and participant's personal goals.      Expected Outcomes Short Term: Increase workloads from initial exercise prescription for resistance, speed, and  METs.;Short Term: Perform resistance training exercises routinely during rehab and add in resistance training at home;Long Term: Improve cardiorespiratory fitness, muscular endurance and strength as measured by increased METs and functional capacity (6MWT) Short Term: Increase workloads from initial exercise prescription for resistance, speed, and METs.;Short Term: Perform resistance training exercises routinely during rehab and add in resistance training at home;Long Term: Improve cardiorespiratory fitness, muscular endurance and strength as measured by increased METs and functional capacity (6MWT)      Able to understand and use rate of perceived exertion (RPE) scale Yes Yes      Intervention Provide education and explanation on how to use RPE scale Provide education and explanation on how to use RPE scale      Expected Outcomes Short Term: Able to use RPE daily in rehab to express subjective intensity level;Long Term:  Able to use RPE to guide intensity level when exercising independently Short Term: Able to use RPE daily in rehab to express subjective intensity level;Long Term:  Able to use RPE to guide intensity level when exercising independently      Able to understand and use Dyspnea scale Yes Yes      Intervention Provide education and explanation on how to use Dyspnea scale Provide education and explanation on how to use Dyspnea scale      Expected Outcomes Short Term: Able to use Dyspnea scale daily in rehab to express subjective sense of shortness of breath during exertion;Long Term: Able to use Dyspnea scale to guide intensity level when exercising independently Short Term: Able to use Dyspnea scale daily in rehab to express subjective sense of shortness of breath during exertion;Long Term: Able to use Dyspnea scale to guide intensity level when exercising independently      Knowledge and understanding of Target Heart Rate Range (THRR) Yes Yes      Intervention Provide education and explanation  of THRR including how the numbers were predicted and where they are located for reference Provide education and explanation of THRR including how the numbers were predicted and where they are located for reference      Expected Outcomes Short Term: Able to state/look up THRR;Short Term: Able to use daily as guideline for intensity in rehab;Long Term: Able to use THRR to govern intensity when exercising independently Short Term: Able to state/look up THRR;Short Term: Able to use daily as guideline for intensity in rehab;Long Term: Able to use THRR to govern intensity when exercising independently      Understanding of Exercise Prescription Yes Yes      Intervention Provide education, explanation, and written materials on patient's individual exercise prescription Provide education, explanation, and written materials on patient's individual exercise prescription  Expected Outcomes Short Term: Able to explain program exercise prescription;Long Term: Able to explain home exercise prescription to exercise independently Short Term: Able to explain program exercise prescription;Long Term: Able to explain home exercise prescription to exercise independently               Exercise Goals Re-Evaluation:  Exercise Goals Re-Evaluation     Row Name 02/26/22 1621             Exercise Goal Re-Evaluation   Exercise Goals Review Increase Physical Activity;Able to understand and use Dyspnea scale;Understanding of Exercise Prescription;Increase Strength and Stamina;Knowledge and understanding of Target Heart Rate Range (THRR);Able to understand and use rate of perceived exertion (RPE) scale       Comments Nils was scheduled to begin exercise today. He did not attend due to pulling a muscle. His next scheduled exercise session is next week. Will continue to monitor and progress as able.       Expected Outcomes Through exercise at rehab and home, the patient will decrease shortness of breath with daily  activities and feel confident in carrying out an exercise regimen at home.                Nutrition & Weight - Outcomes:  Pre Biometrics - 02/18/22 1422       Pre Biometrics   Grip Strength 41 kg              Nutrition:  Nutrition Therapy & Goals - 03/12/22 1110       Nutrition Therapy   Diet Heart Healthy/Carbohydrate Consistent Diet      Personal Nutrition Goals   Nutrition Goal Patient to improve diet quality by using the plate method as a daily guide for meal planning to include lean protein/plant protein, fruits, vegetables, whole grains, nonfat dairy as part of well balanced diet    Personal Goal #2 Patient to reduce sodium to '1500mg'$  per day    Comments Arul follows a vegetarian diet including fish, beans/legumes, eggs, dairy. He is not using any protein supplements at this time. He reports not side effects with esbriet; he reports stable appetitie and eating habits. His children are very supportive and involved in his care. Matvey will benefit from participation in pulmonary rehab for nutrition, exercise, and lifestyle modification.      Intervention Plan   Intervention Prescribe, educate and counsel regarding individualized specific dietary modifications aiming towards targeted core components such as weight, hypertension, lipid management, diabetes, heart failure and other comorbidities.;Nutrition handout(s) given to patient.    Expected Outcomes Short Term Goal: Understand basic principles of dietary content, such as calories, fat, sodium, cholesterol and nutrients.;Long Term Goal: Adherence to prescribed nutrition plan.             Nutrition Discharge:   Education Questionnaire Score:   Goals reviewed with patient; copy given to patient.

## 2022-04-02 ENCOUNTER — Telehealth: Payer: Self-pay | Admitting: Internal Medicine

## 2022-04-02 ENCOUNTER — Ambulatory Visit (HOSPITAL_COMMUNITY): Payer: Commercial Managed Care - PPO

## 2022-04-02 ENCOUNTER — Encounter (HOSPITAL_COMMUNITY): Payer: Commercial Managed Care - PPO

## 2022-04-02 NOTE — Telephone Encounter (Signed)
Yes that is fine

## 2022-04-02 NOTE — Telephone Encounter (Signed)
Dr. Chase Caller, please advise if you are ok with [place] ordering 3L of O2 continuous for the patient as well as the POC order that was placed on 3/5.

## 2022-04-03 ENCOUNTER — Other Ambulatory Visit: Payer: Self-pay

## 2022-04-03 ENCOUNTER — Telehealth: Payer: Self-pay

## 2022-04-03 DIAGNOSIS — J849 Interstitial pulmonary disease, unspecified: Secondary | ICD-10-CM

## 2022-04-03 NOTE — Telephone Encounter (Signed)
New order placed for continuous oxygen. Nothing further needed.

## 2022-04-03 NOTE — Telephone Encounter (Signed)
Order placed for O2 concentrator that will provide 3 L of cont. O2 for pt. Order sent to [facility]. Walk is documented on 04/02/22 OV notes. Nothing further needed at this time.

## 2022-04-04 ENCOUNTER — Ambulatory Visit (HOSPITAL_COMMUNITY): Payer: Commercial Managed Care - PPO

## 2022-04-04 ENCOUNTER — Telehealth: Payer: Self-pay | Admitting: Internal Medicine

## 2022-04-04 ENCOUNTER — Encounter (HOSPITAL_COMMUNITY): Payer: Commercial Managed Care - PPO

## 2022-04-04 NOTE — Telephone Encounter (Signed)
  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 (H)  MCHC 30.0 - 36.0 g/dL 34.1  RDW 11.5 - 15.5 % 14.9  Platelets 150.0 - 400.0 K/uL 115.0 (L)  Neutrophils 43.0 - 77.0 % 73.4  Lymphocytes 12.0 - 46.0 % 15.1  Monocytes Relative 3.0 - 12.0 % 5.4  Eosinophil 0.0 - 5.0 % 5.5 (H)  Basophil 0.0 - 3.0 % 0.6  NEUT# 1.4 - 7.7 K/uL 4.6  Lymphocyte # 0.7 - 4.0 K/uL 0.9  Monocyte # 0.1 - 1.0 K/uL 0.3  Eosinophils Absolute 0.0 - 0.7 K/uL 0.3  Basophils Absolute 0.0 - 0.1 K/uL 0.0  Sheep Sorrel IgE kU/L <0.10  Pecan/Hickory Tree IgE kU/L <0.10  E101-IgE Can f 1 <0.10 kU/L <0.10  E102-IgE Can f 2 <0.10 kU/L <0.10  E221-IgE Can f 3 <0.10 kU/L <0.10  E226-IgE Can f 5 <0.10 kU/L <0.10  IgE (Immunoglobulin E), Serum <OR=114 kU/L <OR=114 kU/L 1,589 (H) 2,027 (H)  Allergen, D pternoyssinus,d7 kU/L 0.32 (H)  Cat Dander kU/L <0.10  Dog Dander kU/L 0.11 (H)  [place] Grass kU/L 0.10 (H)  [place] Grass kU/L <0.10  Timothy Grass kU/L <0.10  Cockroach kU/L 0.16 (H)  Aspergillus fumigatus, m3 kU/L <0.10  Allergen, Comm Silver Wendee Copp, t9 kU/L <0.10  Allergen, Cottonwood, t14 kU/L 0.13 (H)  Elm IgE kU/L <0.10  Allergen, Mulberry, t76 kU/L <0.10  Allergen, Oak,t7 kU/L <0.10  COMMON RAGWEED (SHORT) (W1) IGE kU/L <0.10  Allergen, Mouse Urine Protein, e78 kU/L <0.10  D. farinae kU/L 0.30 (H)  Allergen, Cedar tree, t12  kU/L <0.10  Box Elder IgE kU/L <0.10  Rough Pigweed  IgE kU/L <0.10  Allergen, A. alternata, m6 kU/L <0.10  Allergen, P. notatum, m1 kU/L <0.10  Can f 4(e229) IgE <0.10 kU/L <0.10  Can f 6(e230) IgE <0.10 kU/L <0.10  CLADOSPORIUM HERBARUM (M2) IGE kU/L <0.10  Class  0/1 0 0 0/1 0 0 0 0 0 0/1 0/1 0 0 0 0/1 0 0 0 0 0/1 0 0 0 0  STRIATED MUSCLE AB SCREEN NEGATIVE  NEGATIVE  (H): Data is abnormally high (L): Data is abnormally low

## 2022-04-04 NOTE — Telephone Encounter (Signed)
Referral to allergist was placed 03/27/22.

## 2022-04-05 ENCOUNTER — Telehealth: Payer: Self-pay | Admitting: Internal Medicine

## 2022-04-05 NOTE — Telephone Encounter (Signed)
Per patient's chart, he received his oxygen earlier today. Will close encounter.

## 2022-04-09 ENCOUNTER — Ambulatory Visit (HOSPITAL_COMMUNITY): Payer: Commercial Managed Care - PPO

## 2022-04-09 ENCOUNTER — Encounter (HOSPITAL_COMMUNITY): Payer: Commercial Managed Care - PPO

## 2022-04-09 LAB — IGE: IgE (Immunoglobulin E), Serum: 1589 kU/L — ABNORMAL HIGH (ref <=114)

## 2022-04-09 LAB — ACETYLCHOLINE RECEPTOR, BINDING: A CHR BINDING ABS: 0.36 nmol/L

## 2022-04-09 LAB — STRIATED MUSCLE ANTIBODY: STRIATED MUSCLE AB SCREEN: NEGATIVE

## 2022-04-11 ENCOUNTER — Encounter (HOSPITAL_COMMUNITY): Payer: Commercial Managed Care - PPO

## 2022-04-11 ENCOUNTER — Ambulatory Visit (HOSPITAL_COMMUNITY): Payer: Commercial Managed Care - PPO

## 2022-04-16 ENCOUNTER — Encounter (HOSPITAL_COMMUNITY): Payer: Commercial Managed Care - PPO

## 2022-04-18 ENCOUNTER — Encounter (HOSPITAL_COMMUNITY): Payer: Commercial Managed Care - PPO

## 2022-04-23 ENCOUNTER — Encounter (HOSPITAL_COMMUNITY): Payer: Commercial Managed Care - PPO

## 2022-04-25 ENCOUNTER — Encounter (HOSPITAL_COMMUNITY): Payer: Commercial Managed Care - PPO

## 2022-04-26 ENCOUNTER — Other Ambulatory Visit (INDEPENDENT_AMBULATORY_CARE_PROVIDER_SITE_OTHER): Payer: Commercial Managed Care - PPO

## 2022-04-26 DIAGNOSIS — J849 Interstitial pulmonary disease, unspecified: Secondary | ICD-10-CM

## 2022-04-26 DIAGNOSIS — Z5181 Encounter for therapeutic drug level monitoring: Secondary | ICD-10-CM | POA: Diagnosis not present

## 2022-04-26 LAB — HEPATIC FUNCTION PANEL
ALT: 79 U/L — ABNORMAL HIGH (ref 0–53)
AST: 61 U/L — ABNORMAL HIGH (ref 0–37)
Albumin: 2.5 g/dL — ABNORMAL LOW (ref 3.5–5.2)
Alkaline Phosphatase: 251 U/L — ABNORMAL HIGH (ref 39–117)
Bilirubin, Direct: 0.4 mg/dL — ABNORMAL HIGH (ref 0.0–0.3)
Total Bilirubin: 1.2 mg/dL (ref 0.2–1.2)
Total Protein: 6.9 g/dL (ref 6.0–8.3)

## 2022-04-26 LAB — CBC WITH DIFFERENTIAL/PLATELET
Basophils Absolute: 0 10*3/uL (ref 0.0–0.1)
Basophils Relative: 0.4 % (ref 0.0–3.0)
Eosinophils Absolute: 0.2 10*3/uL (ref 0.0–0.7)
Eosinophils Relative: 1.9 % (ref 0.0–5.0)
HCT: 35.3 % — ABNORMAL LOW (ref 39.0–52.0)
Hemoglobin: 11.8 g/dL — ABNORMAL LOW (ref 13.0–17.0)
Lymphocytes Relative: 23.5 % (ref 12.0–46.0)
Lymphs Abs: 2.1 10*3/uL (ref 0.7–4.0)
MCHC: 33.3 g/dL (ref 30.0–36.0)
MCV: 115.5 fl — ABNORMAL HIGH (ref 78.0–100.0)
Monocytes Absolute: 0.5 10*3/uL (ref 0.1–1.0)
Monocytes Relative: 6.1 % (ref 3.0–12.0)
Neutro Abs: 6 10*3/uL (ref 1.4–7.7)
Neutrophils Relative %: 68.1 % (ref 43.0–77.0)
Platelets: 128 10*3/uL — ABNORMAL LOW (ref 150.0–400.0)
RBC: 3.06 Mil/uL — ABNORMAL LOW (ref 4.22–5.81)
RDW: 16.2 % — ABNORMAL HIGH (ref 11.5–15.5)
WBC: 8.9 10*3/uL (ref 4.0–10.5)

## 2022-04-26 LAB — BASIC METABOLIC PANEL
BUN: 24 mg/dL — ABNORMAL HIGH (ref 6–23)
CO2: 23 mEq/L (ref 19–32)
Calcium: 8.3 mg/dL — ABNORMAL LOW (ref 8.4–10.5)
Chloride: 111 mEq/L (ref 96–112)
Creatinine, Ser: 1 mg/dL (ref 0.40–1.50)
GFR: 82.26 mL/min (ref >60.00)
Glucose, Bld: 119 mg/dL — ABNORMAL HIGH (ref 70–99)
Potassium: 3.5 mEq/L (ref 3.5–5.1)
Sodium: 140 mEq/L (ref 135–145)

## 2022-04-28 ENCOUNTER — Telehealth: Payer: Self-pay | Admitting: Internal Medicine

## 2022-04-28 NOTE — Telephone Encounter (Signed)
  AST/ALT increased after addnig esbriet but still < 3X normal  Plan  - recheck in 1 weeks again (seeing Clent Ridges on 05/08/22) - also has he been seen by allergist for high IgE?   Recent Labs  Lab 04/26/22 1342  AST 61*  ALT 79*  ALKPHOS 251*  BILITOT 1.2  PROT 6.9  ALBUMIN 2.5*    Current Outpatient Medications:    albuterol (VENTOLIN HFA) 108 (90 Base) MCG/ACT inhaler, Inhale 2 puffs into the lungs every 6 (six) hours as needed for wheezing or shortness of breath., Disp: 8 g, Rfl: 6   aspirin EC 81 MG EC tablet, Take 1 tablet (81 mg total) by mouth daily. (Patient not taking: Reported on 12/26/2021), Disp: , Rfl:    benzonatate (TESSALON) 200 MG capsule, Take 1 capsule (200 mg total) by mouth 3 (three) times daily as needed for cough. (Patient not taking: Reported on 03/26/2022), Disp: 90 capsule, Rfl: 1   budesonide-formoterol (SYMBICORT) 160-4.5 MCG/ACT inhaler, Inhale 2 puffs into the lungs 2 (two) times daily. (Patient not taking: Reported on 03/26/2022), Disp: 1 each, Rfl: 5   metFORMIN (GLUCOPHAGE) 500 MG tablet, Take 1 tablet (500 mg total) by mouth 2 (two) times daily., Disp: 60 tablet, Rfl: 1   Pirfenidone 267 MG TABS, Take 3 tablets (801 mg total) by mouth with breakfast, with lunch, and with evening meal. Month 2 and onwards, Disp: 270 tablet, Rfl: 5

## 2022-05-01 NOTE — Telephone Encounter (Signed)
Ok thanks 

## 2022-05-01 NOTE — Telephone Encounter (Signed)
I called and spoke with the pt's daugter, [facility], ok per DPR  She is aware of results  She says pt's pulm md at [facility] told him to d/c esbriet a couple days ago based on these labs  He has already stopped taking  Has not seen allergy yet, but has appt with them in May 2024

## 2022-05-08 ENCOUNTER — Ambulatory Visit: Payer: Commercial Managed Care - PPO | Admitting: Primary Care

## 2022-05-09 ENCOUNTER — Encounter: Payer: Self-pay | Admitting: Primary Care

## 2022-05-09 ENCOUNTER — Ambulatory Visit (INDEPENDENT_AMBULATORY_CARE_PROVIDER_SITE_OTHER): Payer: Commercial Managed Care - PPO | Admitting: Primary Care

## 2022-05-09 VITALS — BP 116/80 | HR 83 | Temp 98.2°F | Ht 68.0 in | Wt 178.6 lb

## 2022-05-09 DIAGNOSIS — J849 Interstitial pulmonary disease, unspecified: Secondary | ICD-10-CM

## 2022-05-09 DIAGNOSIS — J84112 Idiopathic pulmonary fibrosis: Secondary | ICD-10-CM | POA: Diagnosis not present

## 2022-05-09 DIAGNOSIS — Z5181 Encounter for therapeutic drug level monitoring: Secondary | ICD-10-CM

## 2022-05-09 DIAGNOSIS — R0989 Other specified symptoms and signs involving the circulatory and respiratory systems: Secondary | ICD-10-CM

## 2022-05-09 DIAGNOSIS — J9611 Chronic respiratory failure with hypoxia: Secondary | ICD-10-CM

## 2022-05-09 DIAGNOSIS — R768 Other specified abnormal immunological findings in serum: Secondary | ICD-10-CM

## 2022-05-09 DIAGNOSIS — R748 Abnormal levels of other serum enzymes: Secondary | ICD-10-CM

## 2022-05-09 LAB — CBC WITH DIFFERENTIAL/PLATELET
Basophils Absolute: 0 10*3/uL (ref 0.0–0.1)
Basophils Relative: 0.2 % (ref 0.0–3.0)
Eosinophils Absolute: 0.2 10*3/uL (ref 0.0–0.7)
Eosinophils Relative: 2.2 % (ref 0.0–5.0)
HCT: 32 % — ABNORMAL LOW (ref 39.0–52.0)
Hemoglobin: 10.8 g/dL — ABNORMAL LOW (ref 13.0–17.0)
Lymphocytes Relative: 23.2 % (ref 12.0–46.0)
Lymphs Abs: 1.7 10*3/uL (ref 0.7–4.0)
MCHC: 33.8 g/dL (ref 30.0–36.0)
MCV: 114.8 fl — ABNORMAL HIGH (ref 78.0–100.0)
Monocytes Absolute: 0.5 10*3/uL (ref 0.1–1.0)
Monocytes Relative: 7.1 % (ref 3.0–12.0)
Neutro Abs: 4.9 10*3/uL (ref 1.4–7.7)
Neutrophils Relative %: 67.3 % (ref 43.0–77.0)
Platelets: 113 10*3/uL — ABNORMAL LOW (ref 150.0–400.0)
RBC: 2.79 Mil/uL — ABNORMAL LOW (ref 4.22–5.81)
RDW: 15.8 % — ABNORMAL HIGH (ref 11.5–15.5)
WBC: 7.2 10*3/uL (ref 4.0–10.5)

## 2022-05-09 LAB — HEPATIC FUNCTION PANEL
ALT: 50 U/L (ref 0–53)
AST: 49 U/L — ABNORMAL HIGH (ref 0–37)
Albumin: 2.4 g/dL — ABNORMAL LOW (ref 3.5–5.2)
Alkaline Phosphatase: 260 U/L — ABNORMAL HIGH (ref 39–117)
Bilirubin, Direct: 0.4 mg/dL — ABNORMAL HIGH (ref 0.0–0.3)
Total Bilirubin: 1.1 mg/dL (ref 0.2–1.2)
Total Protein: 6.8 g/dL (ref 6.0–8.3)

## 2022-05-09 LAB — BASIC METABOLIC PANEL
BUN: 27 mg/dL — ABNORMAL HIGH (ref 6–23)
CO2: 23 mEq/L (ref 19–32)
Calcium: 8.3 mg/dL — ABNORMAL LOW (ref 8.4–10.5)
Chloride: 109 mEq/L (ref 96–112)
Creatinine, Ser: 1.06 mg/dL (ref 0.40–1.50)
GFR: 76.68 mL/min (ref >60.00)
Glucose, Bld: 101 mg/dL — ABNORMAL HIGH (ref 70–99)
Potassium: 3.8 mEq/L (ref 3.5–5.1)
Sodium: 138 mEq/L (ref 135–145)

## 2022-05-09 MED ORDER — MONTELUKAST SODIUM 10 MG PO TABS
10.0000 mg | ORAL_TABLET | Freq: Every day | ORAL | 3 refills | Status: DC
Start: 2022-05-09 — End: 2022-06-11

## 2022-05-09 MED ORDER — CETIRIZINE HCL 10 MG PO TABS: 10.0000 mg | ORAL_TABLET | Freq: Every day | ORAL | 3 refills | Status: DC

## 2022-05-09 NOTE — Assessment & Plan Note (Addendum)
-   Breathing has been worse over the last 1-2 months. He has evidence of ascites on exam and is being worked up for liver cirrhosis. Esbriet on hold per Dr. Foy Guadalajara d/t elevated liver function. Referred to GI. Fatigue has improved on prednisone taper, he would like to return to pulmonary rehab. He will follow-up with Dr. Foy Guadalajara with [facility] in early May to discuss re-starting antifibrotics and is being worked up by transplant team at that time as well

## 2022-05-09 NOTE — Progress Notes (Signed)
  ID: Richard Davenport, male    DOB: Jun 06, 1960, 62 y.o.   MRN: [id number]  No chief complaint on file.   Referring provider: No ref. provider found  HPI: 62 year old, never smoked. PMH significant for idiopathic pulmonary fibrosis, chronic respiratory failure.   05/09/2022 Patient presents today for 6-8 week follow-up. He has interstitial lung disease and is followed by Dr. Marchelle Gearing. He is on Esbriet and CellCept. Established with Dr. Josph Macho with [facility] and Dr. Mauri Reading with [facility] for cirrhosis work up.  He is accompanied by his daughter.  Shortness of breath has been worse over the last month and a half with activity.  Feels this is related to fluid containing in abdomen and legs.  He saw a GI specialist at [facility] and was started on furosemide and spironolactone.  Esbriet currently on hold per Dr. Foy Guadalajara due to elevated LFTs.  He recently was prescribed prednisone taper which has helped with fatigue.  He has a chronic dry cough that is mainly present in the morning. He is wanting to return to pulmonary rehab. Seeing Dr. Foy Guadalajara May 2-3 and will re-assess antifibrotics /along with transplant consideration.   IgE 1,589 (2,027); eosinophils 300 Allergies to [place] grass, [place], roach, dog dander, dust mites Patient will be seen Dr. Lucie Leather with allergy and asthma on May 21     SYMPTOM SCALE - ILD 03/26/2022 05/09/2022   Current weight   178lbs  O2 use ra RA  Shortness of Breath 0 -> 5 scale with 5 being worst (score 6 If unable to do)   At rest 1.5 0  Simple tasks - showers, clothes change, eating, shaving 3 5  Household (dishes, doing bed, laundry) 4 5  Shopping 3 3  Walking level at own pace 3 3  Walking up Stairs 4 5  Total (30-36) Dyspnea Score 18.5 21  How bad is your cough? 3 2  How bad is your fatigue Tires esp after rehab 3  How bad is nausea 0 0  How bad is vomiting?   0 0  How bad is diarrhea? 0 0  How bad is anxiety? x 0  How bad is  depression x 0  Any chronic pain - if so where and how bad x 0      Simple office walk 185 feet x  3 laps goal with forehead probe 03/26/2022    05/09/2022   O2 used ra  RA  Number laps completed 1-2 laps out of 3  3 laps   Comments about pace x  x  Resting Pulse Ox/HR 98% and 86/min  92% and 90/min  Final Pulse Ox/HR 86% and 104/min  87% and 96/min  Desaturated </= 88% yes  yes  Desaturated <= 3% points yes  yes  Got Tachycardic >/= 90/min 12 pints  yes   Symptoms at end of test x  x  Miscellaneous comments Needed 3L to correct   Needs 3-4L to correct       Allergies  Allergen Reactions   Pork-Derived Products Other (See Comments)    Patient is Muslim and PREFERS TO NOT TAKE ANY PORK OR MEAT PRODUCTS (only fish)    Immunization History  Administered Date(s) Administered   Influenza Whole 11/05/2021   Influenza,inj,Quad PF,6+ Mos 10/13/2012   PNEUMOCOCCAL CONJUGATE-20 01/24/2022   Tdap 05/25/2020    Past Medical History:  Diagnosis Date   Diabetes mellitus without complication    Headache    Idiopathic pulmonary fibrosis  Stroke    Subarachnoid hemorrhage     Tobacco History: Social History   Tobacco Use  Smoking Status Never  Smokeless Tobacco Never   Counseling given: Not Answered   Outpatient Medications Prior to Visit  Medication Sig Dispense Refill   albuterol (VENTOLIN HFA) 108 (90 Base) MCG/ACT inhaler Inhale 2 puffs into the lungs every 6 (six) hours as needed for wheezing or shortness of breath. 8 g 6   furosemide (LASIX) 40 MG tablet Take by mouth.     metFORMIN (GLUCOPHAGE) 500 MG tablet Take 1 tablet (500 mg total) by mouth 2 (two) times daily. 60 tablet 1   Pirfenidone 267 MG TABS Take 3 tablets (801 mg total) by mouth with breakfast, with lunch, and with evening meal. Month 2 and onwards 270 tablet 5   spironolactone (ALDACTONE) 50 MG tablet Take 1 tablet by mouth daily.     aspirin EC 81 MG EC tablet Take 1 tablet (81 mg total) by mouth  daily. (Patient not taking: Reported on 12/26/2021)     benzonatate (TESSALON) 200 MG capsule Take 1 capsule (200 mg total) by mouth 3 (three) times daily as needed for cough. (Patient not taking: Reported on 03/26/2022) 90 capsule 1   budesonide-formoterol (SYMBICORT) 160-4.5 MCG/ACT inhaler Inhale 2 puffs into the lungs 2 (two) times daily. (Patient not taking: Reported on 03/26/2022) 1 each 5   No facility-administered medications prior to visit.    Review of Systems  Review of Systems  Constitutional:  Positive for fatigue.  HENT: Negative.    Respiratory:  Positive for cough and shortness of breath.   Cardiovascular:  Positive for leg swelling.   Physical Exam  BP 116/80 (BP Location: Right Arm, Patient Position: Sitting, Cuff Size: Normal)   Pulse 83   Temp 98.2 F (36.8 C) (Oral)   Ht  (1.727 m)   Wt 178 lb 9.6 oz (81 kg)   SpO2 91%   BMI 27.16 kg/m  Physical Exam Constitutional:      General: He is not in acute distress.    Appearance: Normal appearance.  HENT:     Head: Normocephalic and atraumatic.  Cardiovascular:     Rate and Rhythm: Normal rate.  Pulmonary:     Effort: Pulmonary effort is normal. No respiratory distress.     Breath sounds: Rales present. No wheezing or rhonchi.  Abdominal:     General: There is distension.     Palpations: Abdomen is soft.  Musculoskeletal:        General: Normal range of motion.     Right lower leg: Edema present.     Left lower leg: Edema present.  Skin:    General: Skin is warm and dry.  Neurological:     General: No focal deficit present.     Mental Status: He is alert and oriented to person, place, and time. Mental status is at baseline.  Psychiatric:        Mood and Affect: Mood normal.        Behavior: Behavior normal.        Thought Content: Thought content normal.        Judgment: Judgment normal.      Lab Results:  CBC    Component Value Date/Time   WBC 7.2 05/09/2022 1019   RBC 2.79 (L) 05/09/2022  1019   HGB 10.8 (L) 05/09/2022 1019   HGB 11.2 (L) 01/04/2022 1159   HCT 32.0 (L) 05/09/2022 1019   HCT 32.4 (  L) 01/04/2022 1159   PLT 113.0 (L) 05/09/2022 1019   MCV 114.8 Repeated and verified X2. (H) 05/09/2022 1019   MCV 110 (H) 01/04/2022 1159   MCH 38.1 (H) 01/04/2022 1159   MCH 34.8 (H) 04/08/2018 2009   MCHC 33.8 05/09/2022 1019   RDW 15.8 (H) 05/09/2022 1019   RDW 12.7 01/04/2022 1159   LYMPHSABS 1.7 05/09/2022 1019   LYMPHSABS 1.3 01/04/2022 1159   MONOABS 0.5 05/09/2022 1019   EOSABS 0.2 05/09/2022 1019   EOSABS 0.3 01/04/2022 1159   BASOSABS 0.0 05/09/2022 1019   BASOSABS 0.0 01/04/2022 1159    BMET    Component Value Date/Time   NA 138 05/09/2022 1019   NA 140 01/04/2022 1159   K 3.8 05/09/2022 1019   CL 109 05/09/2022 1019   CO2 23 05/09/2022 1019   GLUCOSE 101 (H) 05/09/2022 1019   BUN 27 (H) 05/09/2022 1019   BUN 18 01/04/2022 1159   CREATININE 1.06 05/09/2022 1019   CALCIUM 8.3 (L) 05/09/2022 1019   GFRNONAA >60 04/08/2018 2009   GFRAA >60 04/08/2018 2009    BNP    Component Value Date/Time   BNP 156.8 (H) 01/04/2022 1159    ProBNP No results found for: "PROBNP"  Imaging: No results found.   Assessment & Plan:   Idiopathic pulmonary fibrosis (HCC) - Breathing has been worse over the last 1-2 months. He has evidence of ascites on exam and is being worked up for liver cirrhosis. Esbriet on hold per Dr. Foy Guadalajara d/t elevated liver function. Referred to GI. Fatigue has improved on prednisone taper, he would like to return to pulmonary rehab. He will follow-up with Dr. Foy Guadalajara with [facility] in early May to discuss re-starting antifibrotics and is being worked up by transplant team at that time as well   Chronic respiratory failure - Patient is on 3L oxygen. He was unable to qualify for POC today.  - We will refer back to pulmonary rehab   Evidence of airways hyperreactivity without diagnosis of asthma - Patient reports wheezing symptoms and chronic dry  cough worse in morning - He has rales on exam without over wheezing  - IgE 1,589 (2,027); eosinophils 300; RAST + [place] grass, [place], roach, dog dander, dust mites - Starting patient on Singulair 10mg  at bedtime and Cetirizine 10mg  daily  - Intolerant to Symbicort d/t thrush. Continue PRN Albuterol hfa 2 puffs q 6 hours - Patient will be seen Dr. Lucie Leather with allergy and asthma on May 21  Abnormal liver enzymes - Patient saw Dr. Mauri Reading with Gastroenterology at [facility] on 05/06/22. Evidence of ascites on exam with LE swelling. No evidence of acute liver failure.  - Esbriet not felt to be driving elevated LFTs, deferred to pulmonary on whether or not to re-start medication  - Plan to complete work up for abnormal liver enzymes with labs (Hepatitis B, Hepatitis C, ANA, Anti-smooth muscle antibody, CBC, BMP, HFP, Lipid Panel, Immunoglobulin levels) and abdominal ultrasound - Started on furosemide and spironolactone    Glenford Bayley, NP 05/09/2022

## 2022-05-09 NOTE — Assessment & Plan Note (Addendum)
-   Patient saw Dr. Mauri Reading with Gastroenterology at [facility] on 05/06/22. Evidence of ascites on exam with LE swelling. No evidence of acute liver failure.  - Esbriet not felt to be driving elevated LFTs, deferred to pulmonary on whether or not to re-start medication  - Plan to complete work up for abnormal liver enzymes with labs (Hepatitis B, Hepatitis C, ANA, Anti-smooth muscle antibody, CBC, BMP, HFP, Lipid Panel, Immunoglobulin levels) and abdominal ultrasound - Started on furosemide and spironolactone

## 2022-05-09 NOTE — Patient Instructions (Addendum)
  IgE 1,589 (2,027); eosinophils 300 Allergies to [place] grass, [place], roach, dog dander, dust mites  Recommendations: - Start Singulair  at bedtime  (April 23rd) - no drug interaction  - Start Zyrtec  daily (April 23rd) - no drug interactions  - Continue Albuterol 2 puffs every 6 hours as needed  - Hold Esbriet per Dr. Foy Guadalajara - Get labs today   Orders: - Refer back to Pulmonary rehab  - DME order for POC  - Get labs today (already ordered)  Follow-up: - Please schedule follow-up with Dr. Marchelle Gearing in early June (30 min-ILD)

## 2022-05-09 NOTE — Assessment & Plan Note (Addendum)
-   Patient reports wheezing symptoms and chronic dry cough worse in morning - He has rales on exam without over wheezing  - IgE 1,589 (2,027); eosinophils 300; RAST + [place] grass, [place], roach, dog dander, dust mites - Starting patient on Singulair  at bedtime and Cetirizine  daily  - Intolerant to Symbicort d/t thrush. Continue PRN Albuterol hfa 2 puffs q 6 hours - Patient will be seen Dr. Lucie Leather with allergy and asthma on May 21

## 2022-05-09 NOTE — Assessment & Plan Note (Addendum)
-   Patient is on 3L oxygen. He was unable to qualify for POC today.  - We will refer back to pulmonary rehab

## 2022-05-18 ENCOUNTER — Other Ambulatory Visit: Payer: Self-pay

## 2022-05-18 ENCOUNTER — Observation Stay (HOSPITAL_COMMUNITY)
Admission: EM | Admit: 2022-05-18 | Discharge: 2022-05-20 | Disposition: A | Discharge status: Home / Self Care | Payer: Commercial Managed Care - PPO | Attending: Internal Medicine | Admitting: Internal Medicine

## 2022-05-18 DIAGNOSIS — Z79899 Other long term (current) drug therapy: Secondary | ICD-10-CM

## 2022-05-18 DIAGNOSIS — J9601 Acute respiratory failure with hypoxia: Secondary | ICD-10-CM | POA: Insufficient documentation

## 2022-05-18 DIAGNOSIS — J84112 Idiopathic pulmonary fibrosis: Secondary | ICD-10-CM | POA: Insufficient documentation

## 2022-05-18 DIAGNOSIS — R7989 Other specified abnormal findings of blood chemistry: Secondary | ICD-10-CM

## 2022-05-18 DIAGNOSIS — J8410 Pulmonary fibrosis, unspecified: Secondary | ICD-10-CM | POA: Diagnosis present

## 2022-05-18 DIAGNOSIS — N179 Acute kidney failure, unspecified: Principal | ICD-10-CM | POA: Insufficient documentation

## 2022-05-18 DIAGNOSIS — R4182 Altered mental status, unspecified: Secondary | ICD-10-CM | POA: Diagnosis present

## 2022-05-18 DIAGNOSIS — R188 Other ascites: Secondary | ICD-10-CM | POA: Diagnosis present

## 2022-05-18 DIAGNOSIS — E1165 Type 2 diabetes mellitus with hyperglycemia: Secondary | ICD-10-CM | POA: Diagnosis not present

## 2022-05-18 DIAGNOSIS — T380X5A Adverse effect of glucocorticoids and synthetic analogues, initial encounter: Secondary | ICD-10-CM | POA: Diagnosis present

## 2022-05-18 DIAGNOSIS — J9611 Chronic respiratory failure with hypoxia: Secondary | ICD-10-CM | POA: Diagnosis present

## 2022-05-18 DIAGNOSIS — E872 Acidosis, unspecified: Secondary | ICD-10-CM | POA: Diagnosis present

## 2022-05-18 DIAGNOSIS — Z79624 Long term (current) use of inhibitors of nucleotide synthesis: Secondary | ICD-10-CM

## 2022-05-18 DIAGNOSIS — I8510 Secondary esophageal varices without bleeding: Secondary | ICD-10-CM | POA: Diagnosis present

## 2022-05-18 DIAGNOSIS — E860 Dehydration: Secondary | ICD-10-CM | POA: Diagnosis present

## 2022-05-18 DIAGNOSIS — A419 Sepsis, unspecified organism: Principal | ICD-10-CM

## 2022-05-18 DIAGNOSIS — Z8249 Family history of ischemic heart disease and other diseases of the circulatory system: Secondary | ICD-10-CM

## 2022-05-18 DIAGNOSIS — Z7951 Long term (current) use of inhaled steroids: Secondary | ICD-10-CM

## 2022-05-18 DIAGNOSIS — Z8673 Personal history of transient ischemic attack (TIA), and cerebral infarction without residual deficits: Secondary | ICD-10-CM

## 2022-05-18 DIAGNOSIS — K7460 Unspecified cirrhosis of liver: Secondary | ICD-10-CM | POA: Insufficient documentation

## 2022-05-18 DIAGNOSIS — Z1152 Encounter for screening for COVID-19: Secondary | ICD-10-CM | POA: Insufficient documentation

## 2022-05-18 DIAGNOSIS — Z7984 Long term (current) use of oral hypoglycemic drugs: Secondary | ICD-10-CM

## 2022-05-18 LAB — COMPREHENSIVE METABOLIC PANEL
ALT: 67 U/L — ABNORMAL HIGH (ref 0–44)
AST: 61 U/L — ABNORMAL HIGH (ref 15–41)
Albumin: 2.1 g/dL — ABNORMAL LOW (ref 3.5–5.0)
Alkaline Phosphatase: 286 U/L — ABNORMAL HIGH (ref 38–126)
Anion gap: 11 (ref 5–15)
BUN: 30 mg/dL — ABNORMAL HIGH (ref 6–20)
CO2: 23 mmol/L (ref 22–32)
Calcium: 8 mg/dL — ABNORMAL LOW (ref 8.9–10.3)
Chloride: 97 mmol/L — ABNORMAL LOW (ref 98–111)
Creatinine, Ser: 1.63 mg/dL — ABNORMAL HIGH (ref 0.61–1.24)
GFR, Estimated: 48 mL/min — ABNORMAL LOW (ref >60)
Glucose, Bld: 332 mg/dL — ABNORMAL HIGH (ref 70–99)
Potassium: 4.3 mmol/L (ref 3.5–5.1)
Sodium: 131 mmol/L — ABNORMAL LOW (ref 135–145)
Total Bilirubin: 1 mg/dL (ref 0.3–1.2)
Total Protein: 7.1 g/dL (ref 6.5–8.1)

## 2022-05-18 LAB — CBC
HCT: 33.9 % — ABNORMAL LOW (ref 39.0–52.0)
Hemoglobin: 11.5 g/dL — ABNORMAL LOW (ref 13.0–17.0)
MCH: 37.7 pg — ABNORMAL HIGH (ref 26.0–34.0)
MCHC: 33.9 g/dL (ref 30.0–36.0)
MCV: 111.1 fL — ABNORMAL HIGH (ref 80.0–100.0)
Platelets: 124 10*3/uL — ABNORMAL LOW (ref 150–400)
RBC: 3.05 MIL/uL — ABNORMAL LOW (ref 4.22–5.81)
RDW: 14.5 % (ref 11.5–15.5)
WBC: 7.1 10*3/uL (ref 4.0–10.5)
nRBC: 0 % (ref 0.0–0.2)

## 2022-05-18 LAB — CBG MONITORING, ED: Glucose-Capillary: 329 mg/dL — ABNORMAL HIGH (ref 70–99)

## 2022-05-18 MED ORDER — SODIUM CHLORIDE 0.9 % IV BOLUS: 1000.0000 mL | Freq: Once | INTRAVENOUS | Status: DC

## 2022-05-18 MED ORDER — SODIUM CHLORIDE 0.9 % IV SOLN
2.0000 g | Freq: Once | INTRAVENOUS | Status: AC
  Administered 2022-05-19: 2 g via INTRAVENOUS
  Filled 2022-05-18: qty 20

## 2022-05-18 MED ORDER — SODIUM CHLORIDE 0.9 % IV BOLUS
1000.0000 mL | Freq: Once | INTRAVENOUS | Status: AC
  Administered 2022-05-19: 1000 mL via INTRAVENOUS

## 2022-05-18 NOTE — ED Triage Notes (Addendum)
Pt via [facility] from home due to family reporting AMS x 2 days. Hx blood clots, strokes, DM2. NIH(-) per EMS. CBG 420 en route. Baseline O2 requirement 3L at all times. GCS 14, currently disoriented. Pt states he check his sugar daily and it is usually around 120; DM2 managed with metformin.   HR 110  20g R AC  NS given

## 2022-05-18 NOTE — Progress Notes (Signed)
Elink monitoring for the code sepsis protocol.  

## 2022-05-19 ENCOUNTER — Encounter (HOSPITAL_COMMUNITY): Payer: Self-pay | Admitting: Internal Medicine

## 2022-05-19 ENCOUNTER — Emergency Department (HOSPITAL_COMMUNITY): Payer: Commercial Managed Care - PPO

## 2022-05-19 ENCOUNTER — Observation Stay (HOSPITAL_COMMUNITY): Payer: Commercial Managed Care - PPO

## 2022-05-19 DIAGNOSIS — T380X5A Adverse effect of glucocorticoids and synthetic analogues, initial encounter: Secondary | ICD-10-CM | POA: Diagnosis present

## 2022-05-19 DIAGNOSIS — R4182 Altered mental status, unspecified: Secondary | ICD-10-CM

## 2022-05-19 DIAGNOSIS — Z8673 Personal history of transient ischemic attack (TIA), and cerebral infarction without residual deficits: Secondary | ICD-10-CM | POA: Diagnosis not present

## 2022-05-19 DIAGNOSIS — E860 Dehydration: Secondary | ICD-10-CM | POA: Diagnosis present

## 2022-05-19 DIAGNOSIS — K7460 Unspecified cirrhosis of liver: Secondary | ICD-10-CM | POA: Diagnosis present

## 2022-05-19 DIAGNOSIS — I8510 Secondary esophageal varices without bleeding: Secondary | ICD-10-CM | POA: Diagnosis present

## 2022-05-19 DIAGNOSIS — Z1152 Encounter for screening for COVID-19: Secondary | ICD-10-CM | POA: Diagnosis not present

## 2022-05-19 DIAGNOSIS — Z7984 Long term (current) use of oral hypoglycemic drugs: Secondary | ICD-10-CM | POA: Diagnosis not present

## 2022-05-19 DIAGNOSIS — E1165 Type 2 diabetes mellitus with hyperglycemia: Secondary | ICD-10-CM

## 2022-05-19 DIAGNOSIS — R7989 Other specified abnormal findings of blood chemistry: Secondary | ICD-10-CM | POA: Diagnosis not present

## 2022-05-19 DIAGNOSIS — E872 Acidosis, unspecified: Secondary | ICD-10-CM | POA: Diagnosis present

## 2022-05-19 DIAGNOSIS — Z79624 Long term (current) use of inhibitors of nucleotide synthesis: Secondary | ICD-10-CM | POA: Diagnosis not present

## 2022-05-19 DIAGNOSIS — J9611 Chronic respiratory failure with hypoxia: Secondary | ICD-10-CM | POA: Diagnosis not present

## 2022-05-19 DIAGNOSIS — N179 Acute kidney failure, unspecified: Secondary | ICD-10-CM | POA: Diagnosis not present

## 2022-05-19 DIAGNOSIS — Z7951 Long term (current) use of inhaled steroids: Secondary | ICD-10-CM | POA: Diagnosis not present

## 2022-05-19 DIAGNOSIS — R188 Other ascites: Secondary | ICD-10-CM | POA: Diagnosis present

## 2022-05-19 DIAGNOSIS — J84112 Idiopathic pulmonary fibrosis: Secondary | ICD-10-CM | POA: Diagnosis present

## 2022-05-19 DIAGNOSIS — Z79899 Other long term (current) drug therapy: Secondary | ICD-10-CM | POA: Diagnosis not present

## 2022-05-19 DIAGNOSIS — Z8249 Family history of ischemic heart disease and other diseases of the circulatory system: Secondary | ICD-10-CM | POA: Diagnosis not present

## 2022-05-19 LAB — RENAL FUNCTION PANEL
Albumin: 1.8 g/dL — ABNORMAL LOW (ref 3.5–5.0)
Anion gap: 11 (ref 5–15)
BUN: 27 mg/dL — ABNORMAL HIGH (ref 6–20)
CO2: 21 mmol/L — ABNORMAL LOW (ref 22–32)
Calcium: 8 mg/dL — ABNORMAL LOW (ref 8.9–10.3)
Chloride: 102 mmol/L (ref 98–111)
Creatinine, Ser: 1.17 mg/dL (ref 0.61–1.24)
GFR, Estimated: >60 mL/min (ref >60)
Glucose, Bld: 201 mg/dL — ABNORMAL HIGH (ref 70–99)
Phosphorus: 2.5 mg/dL (ref 2.5–4.6)
Potassium: 3.9 mmol/L (ref 3.5–5.1)
Sodium: 134 mmol/L — ABNORMAL LOW (ref 135–145)

## 2022-05-19 LAB — URINALYSIS, ROUTINE W REFLEX MICROSCOPIC
Bilirubin Urine: NEGATIVE
Glucose, UA: NEGATIVE mg/dL
Hgb urine dipstick: NEGATIVE
Ketones, ur: NEGATIVE mg/dL
Leukocytes,Ua: NEGATIVE
Nitrite: NEGATIVE
Protein, ur: NEGATIVE mg/dL
Specific Gravity, Urine: 1.015 (ref 1.005–1.030)
pH: 7 (ref 5.0–8.0)

## 2022-05-19 LAB — LACTIC ACID, PLASMA
Lactic Acid, Venous: 1.6 mmol/L (ref 0.5–1.9)
Lactic Acid, Venous: 2.9 mmol/L (ref 0.5–1.9)
Lactic Acid, Venous: 3.1 mmol/L (ref 0.5–1.9)
Lactic Acid, Venous: 4.4 mmol/L (ref 0.5–1.9)

## 2022-05-19 LAB — TROPONIN I (HIGH SENSITIVITY)
Troponin I (High Sensitivity): 16 ng/L (ref <18)
Troponin I (High Sensitivity): 17 ng/L (ref <18)

## 2022-05-19 LAB — HEMOGLOBIN A1C
Hgb A1c MFr Bld: 5.7 % — ABNORMAL HIGH (ref 4.8–5.6)
Mean Plasma Glucose: 116.89 mg/dL

## 2022-05-19 LAB — GLUCOSE, CAPILLARY
Glucose-Capillary: 113 mg/dL — ABNORMAL HIGH (ref 70–99)
Glucose-Capillary: 90 mg/dL (ref 70–99)
Glucose-Capillary: 173 mg/dL — ABNORMAL HIGH (ref 70–99)
Glucose-Capillary: 205 mg/dL — ABNORMAL HIGH (ref 70–99)

## 2022-05-19 LAB — CBG MONITORING, ED: Glucose-Capillary: 176 mg/dL — ABNORMAL HIGH (ref 70–99)

## 2022-05-19 LAB — LIPASE, BLOOD: Lipase: 31 U/L (ref 11–51)

## 2022-05-19 LAB — SARS CORONAVIRUS 2 BY RT PCR: SARS Coronavirus 2 by RT PCR: NEGATIVE

## 2022-05-19 LAB — PROCALCITONIN: Procalcitonin: 0.1 ng/mL

## 2022-05-19 MED ORDER — ONDANSETRON HCL 4 MG/2ML IJ SOLN: 4.0000 mg | Freq: Four times a day (QID) | INTRAMUSCULAR | Status: DC | PRN

## 2022-05-19 MED ORDER — PREDNISONE 20 MG PO TABS: 30.0000 mg | ORAL_TABLET | Freq: Every day | ORAL | Status: DC

## 2022-05-19 MED ORDER — LEVOFLOXACIN 750 MG PO TABS
750.0000 mg | ORAL_TABLET | Freq: Every day | ORAL | Status: DC
  Administered 2022-05-19: 750 mg via ORAL
  Filled 2022-05-19 (×2): qty 1

## 2022-05-19 MED ORDER — MYCOPHENOLATE MOFETIL 250 MG PO CAPS
1000.0000 mg | ORAL_CAPSULE | Freq: Two times a day (BID) | ORAL | Status: DC
  Administered 2022-05-19 – 2022-05-20 (×3): 1000 mg via ORAL
  Filled 2022-05-19 (×4): qty 4

## 2022-05-19 MED ORDER — ACETAMINOPHEN 650 MG RE SUPP: 650.0000 mg | Freq: Four times a day (QID) | RECTAL | Status: DC | PRN

## 2022-05-19 MED ORDER — ORAL CARE MOUTH RINSE: 15.0000 mL | OROMUCOSAL | Status: DC | PRN

## 2022-05-19 MED ORDER — SODIUM CHLORIDE 0.9 % IV SOLN
500.0000 mg | Freq: Once | INTRAVENOUS | Status: AC
  Administered 2022-05-19: 500 mg via INTRAVENOUS
  Filled 2022-05-19: qty 5

## 2022-05-19 MED ORDER — LEVOFLOXACIN 750 MG PO TABS: 750.0000 mg | ORAL_TABLET | Freq: Every day | ORAL | Status: DC

## 2022-05-19 MED ORDER — INSULIN ASPART 100 UNIT/ML IJ SOLN
0.0000 [IU] | Freq: Every day | INTRAMUSCULAR | Status: DC
  Administered 2022-05-19: 2 [IU] via SUBCUTANEOUS

## 2022-05-19 MED ORDER — ONDANSETRON HCL 4 MG PO TABS: 4.0000 mg | ORAL_TABLET | Freq: Four times a day (QID) | ORAL | Status: DC | PRN

## 2022-05-19 MED ORDER — LACTATED RINGERS IV SOLN: INTRAVENOUS | Status: DC

## 2022-05-19 MED ORDER — PREDNISONE 20 MG PO TABS
30.0000 mg | ORAL_TABLET | Freq: Every day | ORAL | Status: DC
  Administered 2022-05-20: 30 mg via ORAL
  Filled 2022-05-19: qty 1

## 2022-05-19 MED ORDER — ACETAMINOPHEN 325 MG PO TABS: 650.0000 mg | ORAL_TABLET | Freq: Four times a day (QID) | ORAL | Status: DC | PRN

## 2022-05-19 MED ORDER — IPRATROPIUM-ALBUTEROL 0.5-2.5 (3) MG/3ML IN SOLN: 3.0000 mL | Freq: Four times a day (QID) | RESPIRATORY_TRACT | Status: DC | PRN

## 2022-05-19 MED ORDER — LACTATED RINGERS IV SOLN: INTRAVENOUS | Status: AC

## 2022-05-19 MED ORDER — INSULIN GLARGINE-YFGN 100 UNIT/ML ~~LOC~~ SOLN
5.0000 [IU] | Freq: Every day | SUBCUTANEOUS | Status: DC
  Administered 2022-05-19 – 2022-05-20 (×2): 5 [IU] via SUBCUTANEOUS
  Filled 2022-05-19 (×3): qty 0.05

## 2022-05-19 MED ORDER — PREDNISONE 10 MG PO TABS: 10.0000 mg | ORAL_TABLET | Freq: Every day | ORAL | Status: DC

## 2022-05-19 MED ORDER — INSULIN ASPART 100 UNIT/ML IJ SOLN
0.0000 [IU] | Freq: Three times a day (TID) | INTRAMUSCULAR | Status: DC
  Administered 2022-05-19: 3 [IU] via SUBCUTANEOUS
  Administered 2022-05-20: 8 [IU] via SUBCUTANEOUS

## 2022-05-19 MED ORDER — ALUM & MAG HYDROXIDE-SIMETH 200-200-20 MG/5ML PO SUSP
30.0000 mL | ORAL | Status: DC | PRN
  Administered 2022-05-19 – 2022-05-20 (×2): 30 mL via ORAL
  Filled 2022-05-19 (×2): qty 30

## 2022-05-19 MED ORDER — PREDNISONE 20 MG PO TABS: 20.0000 mg | ORAL_TABLET | Freq: Every day | ORAL | Status: DC

## 2022-05-19 NOTE — Assessment & Plan Note (Addendum)
Chronic.  Remains on prednisone 30 mg a day and CellCept.  Unclear if his chest x-ray is consistent with pneumonia.  Chest CT has been ordered.  He was given 1 dose of IV antibiotics with Rocephin and Zithromax.  Await chest CT scan before continuing further antibiotics.  Not convinced at this point he has pneumonia.

## 2022-05-19 NOTE — Subjective & Objective (Signed)
CC: altered mental status HPI: 62 year old male history of idiopathic pulmonary fibrosis, chronic hypoxic respiratory failure, prior history of stroke, prior history of cerebral aneurysm status post coil, type 2 diabetes, elevated liver function test presents to the ER today with altered mental status.  Both his daughters are here.  She states that patient had waxing waning altered mental status for the last 2 days.  He initially got better but began feeling worse today.  Daughter states that patient was at times unconscious today but would wake up being confused.  He has not had any fevers.  He has chronic shortness of breath.  Blood sugars have been elevated to about 400s today.  He is on prednisone for the last several weeks due to fatigue.  Patient has also been aggressively diuresed due to lower extremity edema and abdominal ascites.  He was recently seen for the first time by [facility].  Over the last week and a half, his diuresis been brisk.  He is lost his lower extremity edema.  Daughter states that his abdominal distention is also improved.  Due to altered mental status, patient arrived via EMS.  On arrival temp 99.5 heart rate 98 blood pressure 122/93 satting 100% on 3 L.  White count 7.1, hemoglobin 11.5, platelets of 124  Sodium 131, potassium 4.3, chloride 97, bicarb 23, BUN of 30, creatinine 1.63, serum glucose of 332  AST of 61, ALT is 67, alk phos of 286, total bili 1.0  Lipase normal at 31  Lactic acid elevated at 4.4  COVID-negative  X-ray x-ray demonstrates increased space opacities in the right upper lobe.  He has interstitial bilateral prominence and bibasilar opacities with his known pulmonary fibrosis.  [facility] hospitalist contacted for admission.

## 2022-05-19 NOTE — ED Notes (Signed)
[id number] notified by phone patient is on way. RN stated they are ready.

## 2022-05-19 NOTE — Hospital Course (Addendum)
Taken from H&P.  62 year old male history of idiopathic pulmonary fibrosis, chronic hypoxic respiratory failure, prior history of stroke, prior history of cerebral aneurysm status post coil, type 2 diabetes, elevated liver function test presents to the ER with altered mental status, just being waxing and waning for the past 2 days. Patient was recently given a prolonged course of prednisone due to fatigue and also is being diuresed due to lower extremity edema and abdominal ascites which has been improved.  ED course.  Vitals stable.  CBC with White count 7.1, hemoglobin 11.5, platelets of 124, Sodium 131, potassium 4.3, chloride 97, bicarb 23, BUN of 30, creatinine 1.63, serum glucose of 332, AST of 61, ALT is 67, alk phos of 286, total bili 1.0  Lipase normal at 31, Lactic acid elevated at 4.4, COVID-negative   X-ray x-ray demonstrates increased space opacities in the right upper lobe.  He has interstitial bilateral prominence and bibasilar opacities with his known pulmonary fibrosis.  4/28: Vital stable.  CT head was negative for any acute abnormality.  CT chest with slight increased groundglass density within the right upper lobe and superior right lower lobe suspicious for superimposed mild pneumonia.  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  CBG at 176,UA negative for UTI, lactic acid improving but still elevated 4.4>>3.1>>2.9>1.6 Will continue monitoring lactic acid until normalized. Repeat renal function-creatinine improved to 1.17 with IV fluid Checking procalcitonin-0.10, most likely dehydration with aggressive diuresis. Will repeat procalcitonin tomorrow morning and holding off antibiotics.  Continue IV fluid  4/29: Vitals and labs stable.  Lactic acidosis has been normalized.  Renal function back to baseline with creatinine around 1.  Procalcitonin at 0.25.  Patient was started on Levaquin for concern of pneumonia yesterday and he will  continue to complete a 5-day course.  Saturations stable at his baseline oxygen use of 3 to 4 L.  We decreased the dose of Lasix to 20 mg instead of 40 mg and he will continue with rest of his home medications and need to have a close follow-up with his providers for further recommendations.

## 2022-05-19 NOTE — Assessment & Plan Note (Signed)
Elevated blood sugars for several days, likely due to prednisone therapy.  Family states blood sugars have been in the 300s to 400s today.  Placed him on aggressive sliding scale insulin.  IV fluid hydration should help his hyperglycemia.  Check A1c.

## 2022-05-19 NOTE — Progress Notes (Signed)
SCD placed per order. 

## 2022-05-19 NOTE — Assessment & Plan Note (Signed)
Patient has had waxing waning altered mental status.  This started about 2 to 3 days ago.  Could be associated with his dehydration/acute kidney injury.  He has no focal weakness.  Family states that he is feeling better after IV fluid hydration.  Will check head CT.  May or may not need an MRI if his altered mental status does not improve or if he has other acute neurologic findings.

## 2022-05-19 NOTE — ED Notes (Signed)
Per Dr. Pilar Plate OK for pt to eat and drink

## 2022-05-19 NOTE — Assessment & Plan Note (Signed)
Patient's had elevated LFTs for the last year.  He has been seen by [facility].  Patient did have ascites along with lower extremity edema when he was seen by GI.  He has been aggressively diuresed.  He no longer has lower extremity edema.  Bedside abdominal ultrasound showed only minimal left lower quadrant ascites.  Definitely not enough fluid to safely perform paracentesis.  Hold on further diuresis for now.

## 2022-05-19 NOTE — Assessment & Plan Note (Addendum)
Improved with IV fluid 

## 2022-05-19 NOTE — Assessment & Plan Note (Signed)
Continue with home oxygen therapy at 3 L a minute.

## 2022-05-19 NOTE — ED Notes (Signed)
Date and time results received: 05/19/22 0040  Test: Lactic acid Critical Value: 4.4  Name of Provider Notified: Kennis Carina  Awaiting orders

## 2022-05-19 NOTE — Progress Notes (Signed)
No charge progress note.  62 year old male history of idiopathic pulmonary fibrosis, chronic hypoxic respiratory failure, prior history of stroke, prior history of cerebral aneurysm status post coil, type 2 diabetes, elevated liver function test presents to the ER with altered mental status, just being waxing and waning for the past 2 days. Patient was recently given a prolonged course of prednisone due to fatigue and also is being diuresed due to lower extremity edema and abdominal ascites which has been improved.  ED course.  Vitals stable.  CBC with White count 7.1, hemoglobin 11.5, platelets of 124, Sodium 131, potassium 4.3, chloride 97, bicarb 23, BUN of 30, creatinine 1.63, serum glucose of 332, AST of 61, ALT is 67, alk phos of 286, total bili 1.0  Lipase normal at 31, Lactic acid elevated at 4.4, COVID-negative   X-ray x-ray demonstrates increased space opacities in the right upper lobe.  He has interstitial bilateral prominence and bibasilar opacities with his known pulmonary fibrosis.  4/28: Vital stable.  CT head was negative for any acute abnormality.  CT chest with slight increased groundglass density within the right upper lobe and superior right lower lobe suspicious for superimposed mild pneumonia.  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  CBG at 176,UA negative for UTI, lactic acid improving but still elevated 4.4>>3.1>>2.9>1.6 Will continue monitoring lactic acid until normalized. Repeat renal function-creatinine improved to 1.17 with IV fluid Checking procalcitonin-0.10, most likely dehydration with aggressive diuresis. Will repeat procalcitonin tomorrow morning and holding off antibiotics.  Continue IV fluid.  Patient was having benign exam except bilateral basal dry crackles.  Which is his baseline. Patient has his initial consultation with lung transplant team at [facility] on Thursday.  Giving some more IV fluid, keep holding  diuretic for another day.  We can restart on discharge at a lower dose with a close outpatient GI follow-up.

## 2022-05-19 NOTE — H&P (Signed)
History and Physical    Michiah Mudry WUJ:[id number] DOB: 1960-06-16 DOA: 05/18/2022  DOS: the patient was seen and examined on 05/18/2022  PCP: Pcp, No   Patient coming from: Home  I have personally briefly reviewed patient's old medical records in [place] [facility]  CC: altered mental status HPI: 62 year old male history of idiopathic pulmonary fibrosis, chronic hypoxic respiratory failure, prior history of stroke, prior history of cerebral aneurysm status post coil, type 2 diabetes, elevated liver function test presents to the ER today with altered mental status.  Both his daughters are here.  She states that patient had waxing waning altered mental status for the last 2 days.  He initially got better but began feeling worse today.  Daughter states that patient was at times unconscious today but would wake up being confused.  He has not had any fevers.  He has chronic shortness of breath.  Blood sugars have been elevated to about 400s today.  He is on prednisone for the last several weeks due to fatigue.  Patient has also been aggressively diuresed due to lower extremity edema and abdominal ascites.  He was recently seen for the first time by [facility].  Over the last week and a half, his diuresis been brisk.  He is lost his lower extremity edema.  Daughter states that his abdominal distention is also improved.  Due to altered mental status, patient arrived via EMS.  On arrival temp 99.5 heart rate 98 blood pressure 122/93 satting 100% on 3 L.  White count 7.1, hemoglobin 11.5, platelets of 124  Sodium 131, potassium 4.3, chloride 97, bicarb 23, BUN of 30, creatinine 1.63, serum glucose of 332  AST of 61, ALT is 67, alk phos of 286, total bili 1.0  Lipase normal at 31  Lactic acid elevated at 4.4  COVID-negative  X-ray x-ray demonstrates increased space opacities in the right upper lobe.  He has interstitial bilateral prominence and bibasilar opacities with his known pulmonary  fibrosis.  [facility] hospital[facility] contacted for admission.   ED Course: Scr 1.63, BUN 30, WBC 7.1, initial lactic acid 4.4  Review of Systems:  Review of Systems  Constitutional:  Positive for malaise/fatigue. Negative for chills and fever.  HENT: Negative.    Eyes: Negative.   Respiratory:         Chronic dyspnea  Cardiovascular: Negative.   Gastrointestinal: Negative.   Genitourinary: Negative.   Musculoskeletal: Negative.   Skin: Negative.   Neurological:  Positive for weakness.       Waxing/waning altered mental status  Endo/Heme/Allergies: Negative.   Psychiatric/Behavioral: Negative.    All other systems reviewed and are negative.   Past Medical History:  Diagnosis Date   Diabetes mellitus without complication (HCC)    Headache    Idiopathic pulmonary fibrosis (HCC)    Stroke (HCC)    Subarachnoid hemorrhage (HCC)     Past Surgical History:  Procedure Laterality Date   ANEURYSM COILING     for bleed   LOOP RECORDER INSERTION N/A 04/10/2018   Procedure: LOOP RECORDER INSERTION;  Surgeon: [facility] Salvia, MD;  Location: [facility] INVASIVE CV LAB;  Service: Cardiovascular;  Laterality: N/A;   RADIOLOGY WITH ANESTHESIA N/A 10/09/2012   Procedure: RADIOLOGY WITH ANESTHESIA;  Surgeon: Lisbeth Renshaw, MD;  Location: [facility] OR;  Service: Radiology;  Laterality: N/A;   RADIOLOGY WITH ANESTHESIA N/A 05/27/2013   Procedure: RADIOLOGY WITH ANESTHESIA;  Surgeon: Lisbeth Renshaw, MD;  Location: [facility] OR;  Service: Radiology;  Laterality: N/A;  reports that he has never smoked. He has never used smokeless tobacco. He reports that he does not drink alcohol and does not use drugs.  Allergies  Allergen Reactions   Pork-Derived Products Other (See Comments)    Patient is Muslim and PREFERS TO NOT TAKE ANY PORK OR MEAT PRODUCTS (only fish)    Family History  Problem Relation Age of Onset   Hypertension Mother    Hypertension Father     Prior to Admission medications   Medication Sig  Start Date End Date Taking? Authorizing Provider  albuterol (VENTOLIN HFA) 108 (90 Base) MCG/ACT inhaler Inhale 2 puffs into the lungs every 6 (six) hours as needed for wheezing or shortness of breath. 02/18/22  Yes Olalere, Adewale A, MD  furosemide (LASIX) 40 MG tablet Take 40 mg by mouth daily. 05/06/22 05/06/23 Yes [provider]  metFORMIN (GLUCOPHAGE) 500 MG tablet Take 1 tablet (500 mg total) by mouth 2 (two) times daily. Patient taking differently: Take 500 mg by mouth daily. 12/14/17  Yes Linwood Dibbles, MD  mycophenolate (CELLCEPT) 500 MG tablet Take 1,000 mg by mouth 2 (two) times daily. 03/04/22  Yes [provider]  Pirfenidone 267 MG TABS Take 3 tablets (801 mg total) by mouth with breakfast, with lunch, and with evening meal. Month 2 and onwards 03/13/22  Yes Olalere, Adewale A, MD  predniSONE (DELTASONE) 10 MG tablet Take 10-30 mg by mouth See admin instructions. Take 3 tablets (30 mg total) by mouth once daily for 14 days, THEN 2 tablets (20 mg total) once daily for 30 days, THEN 1 tablet (10 mg total) once daily for 30 days. 05/01/22 07/14/22 Yes [provider]  spironolactone (ALDACTONE) 50 MG tablet Take 50 mg by mouth daily. 05/06/22 05/06/23 Yes [provider]  budesonide-formoterol (SYMBICORT) 160-4.5 MCG/ACT inhaler Inhale 2 puffs into the lungs 2 (two) times daily. Patient not taking: Reported on 03/26/2022 01/04/22   Luciano Cutter, MD  cetirizine (ZYRTEC) 10 MG tablet Take 1 tablet (10 mg total) by mouth daily. Patient not taking: Reported on 05/18/2022 05/09/22   Glenford Bayley, NP  montelukast (SINGULAIR) 10 MG tablet Take 1 tablet (10 mg total) by mouth at bedtime. Patient not taking: Reported on 05/18/2022 05/09/22   Glenford Bayley, NP    Physical Exam: Vitals:   05/18/22 2300 05/19/22 0130 05/19/22 0200 05/19/22 0230  BP: (!) 122/93 118/77 110/72 120/80  Pulse: 98 83 90 89  Resp: (!) 24 (!) 28 (!) 24 (!) 33  Temp: 99.5 F (37.5 C)      TempSrc: Oral     SpO2: 100% 100% 99% 100%  Weight: 81.6 kg     Height: 5\' 8"  (1.727 m)       Physical Exam Vitals and nursing note reviewed.  Constitutional:      General: He is not in acute distress.    Appearance: He is not toxic-appearing.     Comments: Chronically ill male. No distress  HENT:     Head: Normocephalic and atraumatic.     Nose: Nose normal.  Cardiovascular:     Rate and Rhythm: Normal rate and regular rhythm.     Pulses: Normal pulses.  Pulmonary:     Comments: Dry rales bilateral bases Abdominal:     General: Abdomen is flat. Bowel sounds are normal.     Palpations: Abdomen is soft.     Comments: Minimal LLQ ascites on abd U/S  Musculoskeletal:     Right lower  leg: No edema.     Left lower leg: No edema.  Skin:    General: Skin is warm and dry.     Capillary Refill: Capillary refill takes less than 2 seconds.  Neurological:     General: No focal deficit present.     Mental Status: He is oriented to person, place, and time.      Labs on Admission: I have personally reviewed following labs and imaging studies  CBC: Recent Labs  Lab 05/18/22 2315  WBC 7.1  HGB 11.5*  HCT 33.9*  MCV 111.1*  PLT 124*   Basic Metabolic Panel: Recent Labs  Lab 05/18/22 2315  NA 131*  K 4.3  CL 97*  CO2 23  GLUCOSE 332*  BUN 30*  CREATININE 1.63*  CALCIUM 8.0*   GFR: Estimated Creatinine Clearance: 47.2 mL/min (A) (by C-G formula based on SCr of 1.63 mg/dL (H)). Liver Function Tests: Recent Labs  Lab 05/18/22 2315  AST 61*  ALT 67*  ALKPHOS 286*  BILITOT 1.0  PROT 7.1  ALBUMIN 2.1*   Recent Labs  Lab 05/18/22 2315  LIPASE 31   No results for input(s): "AMMONIA" in the last 168 hours. Coagulation Profile: No results for input(s): "INR", "PROTIME" in the last 168 hours. Cardiac Enzymes: Recent Labs  Lab 05/18/22 2315 05/19/22 0154  TROPONINIHS 16 17   BNP (last 3 results) Recent Labs    01/04/22 1159  BNP 156.8*   HbA1C: No  results for input(s): "HGBA1C" in the last 72 hours. CBG: Recent Labs  Lab 05/18/22 2314  GLUCAP 329*   Lipid Profile: No results for input(s): "CHOL", "HDL", "LDLCALC", "TRIG", "CHOLHDL", "LDLDIRECT" in the last 72 hours. Thyroid Function Tests: No results for input(s): "TSH", "T4TOTAL", "FREET4", "T3FREE", "THYROIDAB" in the last 72 hours. Anemia Panel: No results for input(s): "VITAMINB12", "FOLATE", "FERRITIN", "TIBC", "IRON", "RETICCTPCT" in the last 72 hours. Urine analysis:    Component Value Date/Time   COLORURINE STRAW (A) 04/08/2018 2020   APPEARANCEUR CLEAR 04/08/2018 2020   LABSPEC 1.021 04/08/2018 2020   PHURINE 7.0 04/08/2018 2020   GLUCOSEU NEGATIVE 04/08/2018 2020   HGBUR NEGATIVE 04/08/2018 2020   BILIRUBINUR NEGATIVE 04/08/2018 2020   KETONESUR NEGATIVE 04/08/2018 2020   PROTEINUR NEGATIVE 04/08/2018 2020   UROBILINOGEN 0.2 05/27/2014 1230   NITRITE NEGATIVE 04/08/2018 2020   LEUKOCYTESUR NEGATIVE 04/08/2018 2020    Radiological Exams on Admission: I have personally reviewed images DG Chest 2 View  Result Date: 05/19/2022 CLINICAL DATA:  Cough. EXAM: CHEST - 2 VIEW COMPARISON:  10/15/2012, 01/25/2022 FINDINGS: The heart size and mediastinal contours are within normal limits. Lung volumes are low. Interstitial prominence is noted bilaterally with bibasilar opacities, likely related to patient's history of pulmonary fibrosis. Increased airspace opacities are noted in the right upper lobe. No effusion or pneumothorax. Compression deformities are present in the superior endplates at L2 and L3, indeterminate in age. Degenerative changes are present in the thoracic spine. A loop recorder device is present over the midline. IMPRESSION: 1. Increased airspace opacities in the right upper lobe, concerning for pneumonia. 2. Interstitial prominence bilaterally with bibasilar opacities, compatible with known pulmonary fibrosis. 3. Compression deformities in the superior  endplates at L2 and L3, not visualized on prior exams, however indeterminate in age. Electronically Signed   By: Thornell Sartorius M.D.   On: 05/19/2022 00:33    EKG: My personal interpretation of EKG shows: NSR    Assessment/Plan Principal Problem:   AKI (acute kidney injury) (HCC)  Active Problems:   Altered mental status   Chronic respiratory failure with hypoxia (HCC) - 3L/min   Type 2 diabetes mellitus with hyperglycemia (HCC)   Idiopathic pulmonary fibrosis (HCC)   Elevated LFTs   Cirrhosis (HCC)    Assessment and Plan: * AKI (acute kidney injury) (HCC) Admit to telemetry bed.  Continue with IV fluids.  AKA likely due to aggressive diuresis for his lower extremity edema and abdominal ascites.  Hold diuresis.  Altered mental status Patient has had waxing waning altered mental status.  This started about 2 to 3 days ago.  Could be associated with his dehydration/acute kidney injury.  He has no focal weakness.  Family states that he is feeling better after IV fluid hydration.  Will check head CT.  May or may not need an MRI if his altered mental status does not improve or if he has other acute neurologic findings.  Elevated LFTs Patient's had elevated LFTs for the last year.  He has been seen by [facility].  Patient did have ascites along with lower extremity edema when he was seen by GI.  He has been aggressively diuresed.  He no longer has lower extremity edema.  Bedside abdominal ultrasound showed only minimal left lower quadrant ascites.  Definitely not enough fluid to safely perform paracentesis.  Hold on further diuresis for now.  Idiopathic pulmonary fibrosis (HCC) Chronic.  Remains on prednisone 30 mg a day and CellCept.  Unclear if his chest x-ray is consistent with pneumonia.  Chest CT has been ordered.  He was given 1 dose of IV antibiotics with Rocephin and Zithromax.  Await chest CT scan before continuing further antibiotics.  Not convinced at this point he has pneumonia.  Type  2 diabetes mellitus with hyperglycemia (HCC) Elevated blood sugars for several days, likely due to prednisone therapy.  Family states blood sugars have been in the 300s to 400s today.  Placed him on aggressive sliding scale insulin.  IV fluid hydration should help his hyperglycemia.  Check A1c.  Chronic respiratory failure with hypoxia (HCC) - 3L/min Continue with home oxygen therapy at 3 L a minute.  Cirrhosis (HCC) Being worked up by [employer].   DVT prophylaxis: SQ Heparin Code Status: Full Code Family Communication: discussed with pt and his dtr Maleeha and Roomena Disposition Plan: return home  Consults called: none  Admission status: Observation, Telemetry bed   Carollee Herter, DO [facility] 05/19/2022, 2:59 AM

## 2022-05-19 NOTE — ED Provider Notes (Signed)
[facility]-EMERGENCY DEPT [facility] Emergency Department Provider Note MRN:  [id number]  Arrival date & time: 05/19/22     Chief Complaint   Altered Mental Status   History of Present Illness   Richard Davenport is a 62 y.o. year-old male with a history of pulmonary fibrosis, stroke presenting to the ED with chief complaint of altered mental status.  Mild global confusion starting 2 days ago.  Just not quite as sharp as he normally is.  No numbness or weakness to the arms or legs.  Glucose running a bit high.  Review of Systems  A thorough review of systems was obtained and all systems are negative except as noted in the HPI and PMH.   Patient's Health History    Past Medical History:  Diagnosis Date   Diabetes mellitus without complication (HCC)    Headache    Idiopathic pulmonary fibrosis (HCC)    Stroke (HCC)    Subarachnoid hemorrhage (HCC)     Past Surgical History:  Procedure Laterality Date   ANEURYSM COILING     for bleed   LOOP RECORDER INSERTION N/A 04/10/2018   Procedure: LOOP RECORDER INSERTION;  Surgeon: [facility] Salvia, MD;  Location: [facility] INVASIVE CV LAB;  Service: Cardiovascular;  Laterality: N/A;   RADIOLOGY WITH ANESTHESIA N/A 10/09/2012   Procedure: RADIOLOGY WITH ANESTHESIA;  Surgeon: Lisbeth Renshaw, MD;  Location: [facility] OR;  Service: Radiology;  Laterality: N/A;   RADIOLOGY WITH ANESTHESIA N/A 05/27/2013   Procedure: RADIOLOGY WITH ANESTHESIA;  Surgeon: Lisbeth Renshaw, MD;  Location: [facility] OR;  Service: Radiology;  Laterality: N/A;    Family History  Problem Relation Age of Onset   Hypertension Mother    Hypertension Father     Social History   Socioeconomic History   Marital status: Married    Spouse name: Louanna Raw   Number of children: 4   Years of education: 12   Highest education level: Not on file  Occupational History   Not on file  Tobacco Use   Smoking status: Never   Smokeless tobacco: Never  Vaping Use   Vaping Use: Never used   Substance and Sexual Activity   Alcohol use: No   Drug use: No   Sexual activity: Not Currently  Other Topics Concern   Not on file  Social History Narrative   Still working Location manager, 4 children, watches TV   Social Determinants of Health   Financial Resource Strain: Not on file  Food Insecurity: Not on file  Transportation Needs: Not on file  Physical Activity: Not on file  Stress: Not on file  Social Connections: Not on file  Intimate Partner Violence: Not on file     Physical Exam   Vitals:   05/18/22 2300 05/19/22 0130  BP: (!) 122/93 118/77  Pulse: 98 83  Resp: (!) 24 (!) 28  Temp: 99.5 F (37.5 C)   SpO2: 100% 100%    CONSTITUTIONAL: Chronically ill-appearing, NAD NEURO/PSYCH:  Alert and oriented x 3, mild difficulty with complex questioning, normal and symmetric strength and sensation, normal coordination, normal speech EYES:  eyes equal and reactive ENT/NECK:  no LAD, no JVD CARDIO: Regular rate, well-perfused, normal S1 and S2 PULM:  CTAB no wheezing or rhonchi GI/GU:  non-distended, non-tender MSK/SPINE:  No gross deformities, no edema SKIN:  no rash, atraumatic   *Additional and/or pertinent findings included in MDM below  Diagnostic and Interventional Summary    EKG Interpretation  Date/Time:  Saturday May 18 2022 22:57:11  EDT Ventricular Rate:  97 PR Interval:  141 QRS Duration: 97 QT Interval:  348 QTC Calculation: 442 R Axis:   -24 Text Interpretation: Sinus rhythm Abnormal R-wave progression, late transition Left ventricular hypertrophy ST elevation, consider inferior injury Confirmed by Kennis Carina [phone]) on 05/19/2022 2:25:11 AM       Labs Reviewed  COMPREHENSIVE METABOLIC PANEL - Abnormal; Notable for the following components:      Result Value   Sodium 131 (*)    Chloride 97 (*)    Glucose, Bld 332 (*)    BUN 30 (*)    Creatinine, Ser 1.63 (*)    Calcium 8.0 (*)    Albumin 2.1 (*)    AST 61 (*)    ALT 67 (*)     Alkaline Phosphatase 286 (*)    GFR, Estimated 48 (*)    All other components within normal limits  CBC - Abnormal; Notable for the following components:   RBC 3.05 (*)    Hemoglobin 11.5 (*)    HCT 33.9 (*)    MCV 111.1 (*)    MCH 37.7 (*)    Platelets 124 (*)    All other components within normal limits  LACTIC ACID, PLASMA - Abnormal; Notable for the following components:   Lactic Acid, Venous 4.4 (*)    All other components within normal limits  CBG MONITORING, ED - Abnormal; Notable for the following components:   Glucose-Capillary 329 (*)    All other components within normal limits  SARS CORONAVIRUS 2 BY RT PCR  CULTURE, BLOOD (ROUTINE X 2)  CULTURE, BLOOD (ROUTINE X 2)  LIPASE, BLOOD  URINALYSIS, ROUTINE W REFLEX MICROSCOPIC  LACTIC ACID, PLASMA  CBG MONITORING, ED  TROPONIN I (HIGH SENSITIVITY)  TROPONIN I (HIGH SENSITIVITY)    DG Chest 2 View  Final Result    CT CHEST WO CONTRAST    (Results Pending)  CT HEAD WO CONTRAST ( )    (Results Pending)    Medications  lactated ringers infusion ( Intravenous New Bag/Given 05/19/22 0151)  azithromycin (ZITHROMAX) 500 mg in sodium chloride 0.9 % 250 mL IVPB (500 mg Intravenous New Bag/Given 05/19/22 0153)  cefTRIAXone (ROCEPHIN) 2 g in sodium chloride 0.9 % 100 mL IVPB (0 g Intravenous Stopped 05/19/22 0139)  sodium chloride 0.9 % bolus 1,000 mL (0 mLs Intravenous Stopped 05/19/22 0143)     Procedures  /  Critical Care .Critical Care  Performed by: Sabas Sous, MD Authorized by: Sabas Sous, MD   Critical care provider statement:    Critical care time (minutes):  32   Critical care was necessary to treat or prevent imminent or life-threatening deterioration of the following conditions:  Sepsis   Critical care was time spent personally by me on the following activities:  Development of treatment plan with patient or surrogate, discussions with consultants, evaluation of patient's response to treatment, examination  of patient, ordering and review of laboratory studies, ordering and review of radiographic studies, ordering and performing treatments and interventions, pulse oximetry, re-evaluation of patient's condition and review of old charts   ED Course and Medical Decision Making  Initial Impression and Ddx Hyperglycemia, confusion, oral temp 99.5, mild tachypnea, immunocompromised at baseline.  Concern for possible sepsis.  Doubt intracranial pathology given the nonfocal exam.  DKA also considered  Past medical/surgical history that increases complexity of ED encounter: Interstitial lung disease  Interpretation of Diagnostics I personally reviewed the EKG and my interpretation is as follows: Sinus  rhythm  Labs reveal mild acute kidney injury, elevated lactic acid  Patient Reassessment and Ultimate Disposition/Management     Chest x-ray with signs of pneumonia, will admit to medicine.  Patient management required discussion with the following services or consulting groups:  Hospitalist Service  Complexity of Problems Addressed Acute illness or injury that poses threat of life of bodily function  Additional Data Reviewed and Analyzed Further history obtained from: Further history from spouse/family member  Additional Factors Impacting ED Encounter Risk Consideration of hospitalization  Elmer Sow. Pilar Plate, MD [facility] Emergency Medicine [facility] [email or web address]  Final Clinical Impressions(s) / ED Diagnoses     ICD-10-CM   1. Sepsis, due to unspecified organism, unspecified whether acute organ dysfunction present [facility])  A41.9       ED Discharge Orders     None        Discharge Instructions Discussed with and Provided to Patient:   Discharge Instructions   None      Sabas Sous, MD 05/19/22 [phone]

## 2022-05-19 NOTE — Assessment & Plan Note (Signed)
Being worked up by [employer].

## 2022-05-19 NOTE — ED Notes (Signed)
ED TO INPATIENT HANDOFF REPORT  ED Nurse Name and Phone #: Theadora Rama [phone]  S Name/Age/Gender Richard Davenport 62 y.o. male Room/Bed: 030C/030C  Code Status   Code Status: Full Code  Home/SNF/Other Home Patient oriented to: self Is this baseline? No   Triage Complete: Triage complete  Chief Complaint AKI (acute kidney injury) (HCC) [N17.9]  Triage Note Pt via [facility] from home due to family reporting AMS x 2 days. Hx blood clots, strokes, DM2. NIH(-) per EMS. CBG 420 en route. Baseline O2 requirement 3L at all times. GCS 14, currently disoriented. Pt states he check his sugar daily and it is usually around 120; DM2 managed with metformin.   HR 110  20g R AC  NS given    Allergies Allergies  Allergen Reactions   Pork-Derived Products Other (See Comments)    Patient is Muslim and PREFERS TO NOT TAKE ANY PORK OR MEAT PRODUCTS (only fish)    Level of Care/Admitting Diagnosis ED Disposition     ED Disposition  Admit   Condition  --   Comment  Hospital Area: [facility] [[id number]]  Level of Care: Telemetry Medical [[id number]]  May place patient in observation at [facility] or [facility] if equivalent level of care is available:: No  Covid Evaluation: Asymptomatic - no recent exposure (last 10 days) testing not required  Diagnosis: AKI (acute kidney injury) [facility]) [[id number]]  Admitting Physician: Richard Davenport ERIC [[id number]]  Attending Physician: Richard Davenport, ERIC [[id number]]          B Medical/Surgery History Past Medical History:  Diagnosis Date   Diabetes mellitus without complication (HCC)    Headache    Idiopathic pulmonary fibrosis (HCC)    Stroke (HCC)    Subarachnoid hemorrhage (HCC)    Past Surgical History:  Procedure Laterality Date   ANEURYSM COILING     for bleed   LOOP RECORDER INSERTION N/A 04/10/2018   Procedure: LOOP RECORDER INSERTION;  Surgeon: Duke Salvia, MD;  Location[name] INVASIVE CV LAB;  Service: Cardiovascular;  Laterality: N/A;    RADIOLOGY WITH ANESTHESIA N/A 10/09/2012   Procedure: RADIOLOGY WITH ANESTHESIA;  Surgeon: Lisbeth Renshaw, MD;  Location: [facility] OR;  Service: Radiology;  Laterality: N/A;   RADIOLOGY WITH ANESTHESIA N/A 05/27/2013   Procedure: RADIOLOGY WITH ANESTHESIA;  Surgeon: Lisbeth Renshaw, MD;  Location: [facility] OR;  Service: Radiology;  Laterality: N/A;     A IV Location/Drains/Wounds Patient Lines/Drains/Airways Status     Active Line/Drains/Airways     Name Placement date Placement time Site Days   Peripheral IV 05/19/22 20 G Right Antecubital 05/19/22  0212  Antecubital  less than 1   Peripheral IV 05/19/22 20 G 1" Left;Posterior Wrist 05/19/22  0213  Wrist  less than 1            Intake/Output Last 24 hours  Intake/Output Summary (Last 24 hours) at 05/19/2022 0501 Last data filed at 05/19/2022 0305 Gross per 24 hour  Intake 1850 ml  Output --  Net 1850 ml    Labs/Imaging Results for orders placed or performed during the hospital encounter of 05/18/22 (from the past 48 hour(s))  CBG monitoring, ED     Status: Abnormal   Collection Time: 05/18/22 11:14 PM  Result Value Ref Range   Glucose-Capillary 329 (H) 70 - 99 mg/dL    Comment: Glucose reference range applies only to samples taken after fasting for at least 8 hours.  Comprehensive metabolic panel     Status: Abnormal  Collection Time: 05/18/22 11:15 PM  Result Value Ref Range   Sodium 131 (L) 135 - 145 mmol/L   Potassium 4.3 3.5 - 5.1 mmol/L   Chloride 97 (L) 98 - 111 mmol/L   CO2 23 22 - 32 mmol/L   Glucose, Bld 332 (H) 70 - 99 mg/dL    Comment: Glucose reference range applies only to samples taken after fasting for at least 8 hours.   BUN 30 (H) 6 - 20 mg/dL   Creatinine, Ser 9.14 (H) 0.61 - 1.24 mg/dL   Calcium 8.0 (L) 8.9 - 10.3 mg/dL   Total Protein 7.1 6.5 - 8.1 g/dL   Albumin 2.1 (L) 3.5 - 5.0 g/dL   AST 61 (H) 15 - 41 U/L   ALT 67 (H) 0 - 44 U/L   Alkaline Phosphatase 286 (H) 38 - 126 U/L   Total Bilirubin 1.0  0.3 - 1.2 mg/dL   GFR, Estimated 48 (L) >60 mL/min    Comment: (NOTE) Calculated using the CKD-EPI Creatinine Equation (2021)    Anion gap 11 5 - 15    Comment: Performed at [facility], [address] [place], [place] [address]  CBC     Status: Abnormal   Collection Time: 05/18/22 11:15 PM  Result Value Ref Range   WBC 7.1 4.0 - 10.5 K/uL   RBC 3.05 (L) 4.22 - 5.81 MIL/uL   Hemoglobin 11.5 (L) 13.0 - 17.0 g/dL   HCT 62.1 (L) 30.8 - 65.7 %   MCV 111.1 (H) 80.0 - 100.0 fL   MCH 37.7 (H) 26.0 - 34.0 pg   MCHC 33.9 30.0 - 36.0 g/dL   RDW 84.6 96.2 - 95.2 %   Platelets 124 (L) 150 - 400 K/uL   nRBC 0.0 0.0 - 0.2 %    Comment: Performed at [facility], [address], [place], [place] [address]  Troponin I (High Sensitivity)     Status: None   Collection Time: 05/18/22 11:15 PM  Result Value Ref Range   Troponin I (High Sensitivity) 16 <18 ng/L    Comment: (NOTE) Elevated high sensitivity troponin I (hsTnI) values and significant  changes across serial measurements may suggest ACS but many other  chronic and acute conditions are known to elevate hsTnI results.  Refer to the "Links" section for chest pain algorithms and additional  guidance. Performed at [facility], [address], [place], [place] [address]   Lipase, blood     Status: None   Collection Time: 05/18/22 11:15 PM  Result Value Ref Range   Lipase 31 11 - 51 U/L    Comment: Performed at [facility], [address], [place], [place] [address]  SARS Coronavirus 2 by RT PCR (hospital order, performed in [facility] hospital lab) *cepheid single result test* Anterior Nasal Swab     Status: None   Collection Time: 05/18/22 11:38 PM   Specimen: Anterior Nasal Swab  Result Value Ref Range   SARS Coronavirus 2 by RT PCR NEGATIVE NEGATIVE    Comment: Performed at [facility], [address], [place], [place] [address]  Lactic acid, plasma     Status: Abnormal   Collection Time: 05/18/22  11:53 PM  Result Value Ref Range   Lactic Acid, Venous 4.4 (HH) 0.5 - 1.9 mmol/L    Comment: CRITICAL RESULT CALLED TO, READ BACK BY AND VERIFIED WITH Katheran James RN 04/29/22 0036 Enid Derry Performed at [facility], [address] [place], [place] [address]   Troponin I (High Sensitivity)     Status: None   Collection Time: 05/19/22  1:54 AM  Result Value Ref Range   Troponin I (High Sensitivity) 17 <18 ng/L    Comment: (NOTE) Elevated high sensitivity troponin I (hsTnI) values and significant  changes across serial measurements may suggest ACS but many other  chronic and acute conditions are known to elevate hsTnI results.  Refer to the "Links" section for chest pain algorithms and additional  guidance. Performed at [facility], [address] [place], [place] [address]   Lactic acid, plasma     Status: Abnormal   Collection Time: 05/19/22  2:28 AM  Result Value Ref Range   Lactic Acid, Venous 3.1 (HH) 0.5 - 1.9 mmol/L    Comment: CRITICAL VALUE NOTED. VALUE IS CONSISTENT WITH PREVIOUSLY REPORTED/CALLED VALUE Performed at [facility], [address] [place], [place] [address]   Urinalysis, Routine w reflex microscopic -Urine, Clean Catch     Status: None   Collection Time: 05/19/22  3:55 AM  Result Value Ref Range   Color, Urine YELLOW YELLOW   APPearance CLEAR CLEAR   Specific Gravity, Urine 1.015 1.005 - 1.030   pH 7.0 5.0 - 8.0   Glucose, UA NEGATIVE NEGATIVE mg/dL   Hgb urine dipstick NEGATIVE NEGATIVE   Bilirubin Urine NEGATIVE NEGATIVE   Ketones, ur NEGATIVE NEGATIVE mg/dL   Protein, ur NEGATIVE NEGATIVE mg/dL   Nitrite NEGATIVE NEGATIVE   Leukocytes,Ua NEGATIVE NEGATIVE    Comment: Performed at [facility] Lab, [address] [place], [place] [address]  CBG monitoring, ED     Status: Abnormal   Collection Time: 05/19/22  4:12 AM  Result Value Ref Range   Glucose-Capillary 176 (H) 70 - 99 mg/dL    Comment: Glucose reference range  applies only to samples taken after fasting for at least 8 hours.   CT HEAD WO CONTRAST ( )  Result Date: 05/19/2022 CLINICAL DATA:  Delirium EXAM: CT HEAD WITHOUT CONTRAST TECHNIQUE: Contiguous axial images were obtained from the base of the skull through the vertex without intravenous contrast. RADIATION DOSE REDUCTION: This exam was performed according to the departmental dose-optimization program which includes automated exposure control, adjustment of the mA and/or kV according to patient size and/or use of iterative reconstruction technique. COMPARISON:  CT brain 04/09/2018 FINDINGS: Brain: No acute territorial infarction, hemorrhage or intracranial mass. The ventricles are nonenlarged. Linear encephalomalacia within the right frontal lobe at the site of prior ventriculostomy. Vascular: Stent and aneurysm coils with artifact. Skull: Right frontal burr hole.  No fracture Sinuses/Orbits: Extensive mucosal thickening in the sinuses Other: None IMPRESSION: Stable CT appearance of the brain. No CT evidence for acute intracranial abnormality Electronically Signed   By: Jasmine Pang M.D.   On: 05/19/2022 03:51   CT CHEST WO CONTRAST  Result Date: 05/19/2022 CLINICAL DATA:  Altered mental status respiratory illness EXAM: CT CHEST WITHOUT CONTRAST TECHNIQUE: Multidetector CT imaging of the chest was performed following the standard protocol without IV contrast. RADIATION DOSE REDUCTION: This exam was performed according to the departmental dose-optimization program which includes automated exposure control, adjustment of the mA and/or kV according to patient size and/or use of iterative reconstruction technique. COMPARISON:  Chest x-ray 05/19/2022, chest CT 01/25/2022 FINDINGS: Cardiovascular: Limited evaluation without intravenous contrast. Nonaneurysmal aorta. Borderline to mild cardiomegaly. No pericardial effusion Mediastinum/Nodes: Midline trachea. No thyroid mass. Multiple subcentimeter mediastinal  lymph nodes. Esophagus within normal limits. Lungs/Pleura: Chronic lung disease with bronchiectasis and areas of pulmonary fibrosis, worst at the bilateral lung bases. No pleural effusion or pneumothorax. Slight increased ground-glass density within the right upper lobe and superior right lower lobe compared to the prior CT. Upper Abdomen: Liver cirrhosis. Moderate volume ascites in the abdomen. Gynecomastia. Gastroesophageal varices. Musculoskeletal: No acute or suspicious osseous abnormality. IMPRESSION: 1. Chronic lung disease as seen on the prior CT from January. Since then, slight increased ground-glass density within the right upper lobe and superior right lower lobe suspicious for superimposed mild pneumonia. 2. Liver cirrhosis with moderate volume ascites in the abdomen. Gastroesophageal varices. Electronically Signed   By: Jasmine Pang M.D.   On: 05/19/2022 03:46   DG Chest 2 View  Result Date: 05/19/2022 CLINICAL DATA:  Cough. EXAM: CHEST - 2 VIEW COMPARISON:  10/15/2012, 01/25/2022 FINDINGS: The heart size and mediastinal contours are within normal limits. Lung volumes are low. Interstitial prominence is noted bilaterally with bibasilar opacities, likely related to patient's history of pulmonary fibrosis. Increased airspace opacities are noted in the right upper lobe. No effusion or pneumothorax. Compression deformities are present in the superior endplates at L2 and L3, indeterminate in age. Degenerative changes are present in the thoracic spine. A loop recorder device is present over the midline. IMPRESSION: 1. Increased airspace opacities in the right upper lobe, concerning for pneumonia. 2. Interstitial prominence bilaterally with bibasilar opacities, compatible with known pulmonary fibrosis. 3. Compression deformities in the superior endplates at L2 and L3, not visualized on prior exams, however indeterminate in age. Electronically Signed   By: Thornell Sartorius M.D.   On: 05/19/2022 00:33     Pending Labs Unresulted Labs (From admission, onward)     Start     Ordered   05/18/22 2338  Culture, blood (Routine X 2) w Reflex to ID Panel  BLOOD CULTURE X 2,   R      05/18/22 2337   Signed and Held  Hemoglobin A1c  Once,   R       Comments: To assess prior glycemic control    Signed and Held   Signed and Held  HIV Antibody (routine testing w rflx)  (HIV Antibody (Routine testing w reflex) panel)  Add-on,   R        Signed and Held   Signed and Held  Hemoglobin A1c  Add-on,   R        Signed and Held            Vitals/Pain Today's Vitals   05/19/22 0200 05/19/22 0230 05/19/22 0332 05/19/22 0400  BP: 110/72 120/80 128/81 123/68  Pulse: 90 89 84 99  Resp: (!) 24 (!) 33 (!) 28 (!) 28  Temp:   98.1 F (36.7 C)   TempSrc:   Oral   SpO2: 99% 100% 100% 96%  Weight:      Height:      PainSc:        Isolation Precautions No active isolations  Medications Medications  lactated ringers infusion ( Intravenous New Bag/Given 05/19/22 0151)  cefTRIAXone (ROCEPHIN) 2 g in sodium chloride 0.9 % 100 mL IVPB (0 g Intravenous Stopped 05/19/22 0139)  sodium chloride 0.9 % bolus 1,000 mL (0 mLs Intravenous Stopped 05/19/22 0143)  azithromycin (ZITHROMAX) 500 mg in sodium chloride 0.9 % 250 mL IVPB (0 mg Intravenous Stopped 05/19/22 0305)    Mobility walks with person assist     Focused Assessments Neuro Assessment  Handoff:  Swallow screen pass? Yes          Neuro Assessment: Exceptions to WDL Neuro Checks:      Has TPA been given? No If patient is a Neuro Trauma and patient is going to OR before floor call report to 4N Charge nurse: [phone] or [phone]  , Pulmonary Assessment Handoff:  Lung sounds:   O2 Device: Nasal Cannula (baseline O2 requirement from home) O2 Flow Rate (L/min): 3 L/min    R Recommendations: See Admitting Provider Note  Report given to:   Additional Notes:

## 2022-05-20 DIAGNOSIS — J84112 Idiopathic pulmonary fibrosis: Secondary | ICD-10-CM | POA: Diagnosis not present

## 2022-05-20 DIAGNOSIS — N179 Acute kidney failure, unspecified: Secondary | ICD-10-CM | POA: Diagnosis not present

## 2022-05-20 DIAGNOSIS — R4182 Altered mental status, unspecified: Secondary | ICD-10-CM | POA: Diagnosis not present

## 2022-05-20 DIAGNOSIS — J9611 Chronic respiratory failure with hypoxia: Secondary | ICD-10-CM | POA: Diagnosis not present

## 2022-05-20 LAB — GLUCOSE, CAPILLARY
Glucose-Capillary: 108 mg/dL — ABNORMAL HIGH (ref 70–99)
Glucose-Capillary: 270 mg/dL — ABNORMAL HIGH (ref 70–99)

## 2022-05-20 LAB — BASIC METABOLIC PANEL
Anion gap: 6 (ref 5–15)
BUN: 25 mg/dL — ABNORMAL HIGH (ref 6–20)
CO2: 23 mmol/L (ref 22–32)
Calcium: 7.6 mg/dL — ABNORMAL LOW (ref 8.9–10.3)
Chloride: 106 mmol/L (ref 98–111)
Creatinine, Ser: 1.08 mg/dL (ref 0.61–1.24)
GFR, Estimated: >60 mL/min (ref >60)
Glucose, Bld: 99 mg/dL (ref 70–99)
Potassium: 3.9 mmol/L (ref 3.5–5.1)
Sodium: 135 mmol/L (ref 135–145)

## 2022-05-20 LAB — HEMOGLOBIN A1C
Hgb A1c MFr Bld: 5.6 % (ref 4.8–5.6)
Mean Plasma Glucose: 114.02 mg/dL

## 2022-05-20 LAB — CULTURE, BLOOD (ROUTINE X 2)

## 2022-05-20 LAB — CBC
HCT: 31.6 % — ABNORMAL LOW (ref 39.0–52.0)
Hemoglobin: 10.7 g/dL — ABNORMAL LOW (ref 13.0–17.0)
MCH: 37.9 pg — ABNORMAL HIGH (ref 26.0–34.0)
MCHC: 33.9 g/dL (ref 30.0–36.0)
MCV: 112.1 fL — ABNORMAL HIGH (ref 80.0–100.0)
Platelets: 105 10*3/uL — ABNORMAL LOW (ref 150–400)
RBC: 2.82 MIL/uL — ABNORMAL LOW (ref 4.22–5.81)
RDW: 14.7 % (ref 11.5–15.5)
WBC: 6.5 10*3/uL (ref 4.0–10.5)
nRBC: 0 % (ref 0.0–0.2)

## 2022-05-20 LAB — PROCALCITONIN: Procalcitonin: 0.25 ng/mL

## 2022-05-20 LAB — HIV ANTIBODY (ROUTINE TESTING W REFLEX): HIV Screen 4th Generation wRfx: NONREACTIVE

## 2022-05-20 MED ORDER — FUROSEMIDE 40 MG PO TABS: 20.0000 mg | ORAL_TABLET | Freq: Every day | ORAL | 0 refills | Status: DC

## 2022-05-20 MED ORDER — LEVOFLOXACIN 750 MG PO TABS: 750.0000 mg | ORAL_TABLET | Freq: Every day | ORAL | 0 refills | Status: AC

## 2022-05-20 NOTE — Discharge Summary (Signed)
Physician Discharge Summary   Patient: Richard Davenport MRN: [id number] DOB: 13-Jun-1960  Admit date:     05/18/2022  Discharge date: 05/20/22  Discharge Physician: Arnetha Courser   PCP: Pcp, No   Recommendations at discharge:  Please obtain CBC and BMP within a week Please ensure the completion of antibiotics for concern of pneumonia Follow-up with primary care provider Follow-up with his specialities which are gastroenterology and pulmonology.  Discharge Diagnoses: Principal Problem:   AKI (acute kidney injury) (HCC) Active Problems:   Altered mental status   Chronic respiratory failure with hypoxia (HCC) - 3L/min   Type 2 diabetes mellitus with hyperglycemia (HCC)   Idiopathic pulmonary fibrosis (HCC)   Elevated LFTs   Cirrhosis [facility])   Hospital Course: Taken from H&P.  62 year old male history of idiopathic pulmonary fibrosis, chronic hypoxic respiratory failure, prior history of stroke, prior history of cerebral aneurysm status post coil, type 2 diabetes, elevated liver function test presents to the ER with altered mental status, just being waxing and waning for the past 2 days. Patient was recently given a prolonged course of prednisone due to fatigue and also is being diuresed due to lower extremity edema and abdominal ascites which has been improved.  ED course.  Vitals stable.  CBC with White count 7.1, hemoglobin 11.5, platelets of 124, Sodium 131, potassium 4.3, chloride 97, bicarb 23, BUN of 30, creatinine 1.63, serum glucose of 332, AST of 61, ALT is 67, alk phos of 286, total bili 1.0  Lipase normal at 31, Lactic acid elevated at 4.4, COVID-negative   X-ray x-ray demonstrates increased space opacities in the right upper lobe.  He has interstitial bilateral prominence and bibasilar opacities with his known pulmonary fibrosis.  4/28: Vital stable.  CT head was negative for any acute abnormality.  CT chest with slight increased groundglass density within the right upper lobe  and superior right lower lobe suspicious for superimposed mild pneumonia.  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  CBG at 176,UA negative for UTI, lactic acid improving but still elevated 4.4>>3.1>>2.9>1.6 Will continue monitoring lactic acid until normalized. Repeat renal function-creatinine improved to 1.17 with IV fluid Checking procalcitonin-0.10, most likely dehydration with aggressive diuresis. Will repeat procalcitonin tomorrow morning and holding off antibiotics.  Continue IV fluid  4/29: Vitals and labs stable.  Lactic acidosis has been normalized.  Renal function back to baseline with creatinine around 1.  Procalcitonin at 0.25.  Patient was started on Levaquin for concern of pneumonia yesterday and he will continue to complete a 5-day course.  Saturations stable at his baseline oxygen use of 3 to 4 L.  We decreased the dose of Lasix to 20 mg instead of 40 mg and he will continue with rest of his home medications and need to have a close follow-up with his providers for further recommendations.   Assessment and Plan: * AKI (acute kidney injury) (HCC) Improved with IV fluid.  Altered mental status Patient has had waxing waning altered mental status.  This started about 2 to 3 days ago.  Could be associated with his dehydration/acute kidney injury.  He has no focal weakness.  Family states that he is feeling better after IV fluid hydration.  Will check head CT.  May or may not need an MRI if his altered mental status does not improve or if he has other acute neurologic findings.  Elevated LFTs Patient's had elevated LFTs for the last year.  He has been seen by  [facility].  Patient did have ascites along with lower extremity edema when he was seen by GI.  He has been aggressively diuresed.  He no longer has lower extremity edema.  Bedside abdominal ultrasound showed only minimal left lower quadrant ascites.  Definitely not enough fluid to  safely perform paracentesis.  Hold on further diuresis for now.  Idiopathic pulmonary fibrosis (HCC) Chronic.  Remains on prednisone 30 mg a day and CellCept.  Unclear if his chest x-ray is consistent with pneumonia.  Chest CT has been ordered.  He was given 1 dose of IV antibiotics with Rocephin and Zithromax.  Await chest CT scan before continuing further antibiotics.  Not convinced at this point he has pneumonia.  Type 2 diabetes mellitus with hyperglycemia (HCC) Elevated blood sugars for several days, likely due to prednisone therapy.  Family states blood sugars have been in the 300s to 400s today.  Placed him on aggressive sliding scale insulin.  IV fluid hydration should help his hyperglycemia.  Check A1c.  Chronic respiratory failure with hypoxia (HCC) - 3L/min Continue with home oxygen therapy at 3 L a minute.  Cirrhosis (HCC) Being worked up by [employer].   Consultants: None Procedures performed: None Disposition: Home Diet recommendation:  Discharge Diet Orders (From admission, onward)     Start     Ordered   05/20/22 0000  Diet - low sodium heart healthy        05/20/22 0940           Cardiac and Carb modified diet DISCHARGE MEDICATION: Allergies as of 05/20/2022       Reactions   Pork-derived Products Other (See Comments)   Patient is Muslim and PREFERS TO NOT TAKE ANY PORK OR MEAT PRODUCTS (only fish)        Medication List     STOP taking these medications    cetirizine 10 MG tablet Commonly known as: ZYRTEC       TAKE these medications    albuterol 108 (90 Base) MCG/ACT inhaler Commonly known as: VENTOLIN HFA Inhale 2 puffs into the lungs every 6 (six) hours as needed for wheezing or shortness of breath.   budesonide-formoterol 160-4.5 MCG/ACT inhaler Commonly known as: Symbicort Inhale 2 puffs into the lungs 2 (two) times daily.   furosemide 40 MG tablet Commonly known as: LASIX Take 0.5 tablets (20 mg total) by mouth daily. What changed:  how much to take   levofloxacin 750 MG tablet Commonly known as: LEVAQUIN Take 1 tablet (750 mg total) by mouth daily for 5 days.   metFORMIN 500 MG tablet Commonly known as: GLUCOPHAGE Take 1 tablet (500 mg total) by mouth 2 (two) times daily. What changed: when to take this   montelukast 10 MG tablet Commonly known as: SINGULAIR Take 1 tablet (10 mg total) by mouth at bedtime.   mycophenolate 500 MG tablet Commonly known as: CELLCEPT Take 1,000 mg by mouth 2 (two) times daily.   Pirfenidone 267 MG Tabs Take 3 tablets (801 mg total) by mouth with breakfast, with lunch, and with evening meal. Month 2 and onwards   predniSONE 10 MG tablet Commonly known as: DELTASONE Take 10-30 mg by mouth See admin instructions. Take 3 tablets (30 mg total) by mouth once daily for 14 days, THEN 2 tablets (20 mg total) once daily for 30 days, THEN 1 tablet (10 mg total) once daily for 30 days.   spironolactone 50 MG tablet Commonly known as: ALDACTONE Take 50 mg by mouth  daily.        Discharge Exam: Filed Weights   05/18/22 2300  Weight: 81.6 kg   General.  Well-developed gentleman, in no acute distress. Pulmonary.  Basal dry crackles bilaterally, normal respiratory effort. CV.  Regular rate and rhythm, no JVD, rub or murmur. Abdomen.  Soft, nontender, nondistended, BS positive. CNS.  Alert and oriented .  No focal neurologic deficit. Extremities.  No edema, no cyanosis, pulses intact and symmetrical. Psychiatry.  Judgment and insight appears normal.   Condition at discharge: stable  The results of significant diagnostics from this hospitalization (including imaging, microbiology, ancillary and laboratory) are listed below for reference.   Imaging Studies: CT HEAD WO CONTRAST ( )  Result Date: 05/19/2022 CLINICAL DATA:  Delirium EXAM: CT HEAD WITHOUT CONTRAST TECHNIQUE: Contiguous axial images were obtained from the base of the skull through the vertex without intravenous  contrast. RADIATION DOSE REDUCTION: This exam was performed according to the departmental dose-optimization program which includes automated exposure control, adjustment of the mA and/or kV according to patient size and/or use of iterative reconstruction technique. COMPARISON:  CT brain 04/09/2018 FINDINGS: Brain: No acute territorial infarction, hemorrhage or intracranial mass. The ventricles are nonenlarged. Linear encephalomalacia within the right frontal lobe at the site of prior ventriculostomy. Vascular: Stent and aneurysm coils with artifact. Skull: Right frontal burr hole.  No fracture Sinuses/Orbits: Extensive mucosal thickening in the sinuses Other: None IMPRESSION: Stable CT appearance of the brain. No CT evidence for acute intracranial abnormality Electronically Signed   By: Jasmine Pang M.D.   On: 05/19/2022 03:51   CT CHEST WO CONTRAST  Result Date: 05/19/2022 CLINICAL DATA:  Altered mental status respiratory illness EXAM: CT CHEST WITHOUT CONTRAST TECHNIQUE: Multidetector CT imaging of the chest was performed following the standard protocol without IV contrast. RADIATION DOSE REDUCTION: This exam was performed according to the departmental dose-optimization program which includes automated exposure control, adjustment of the mA and/or kV according to patient size and/or use of iterative reconstruction technique. COMPARISON:  Chest x-ray 05/19/2022, chest CT 01/25/2022 FINDINGS: Cardiovascular: Limited evaluation without intravenous contrast. Nonaneurysmal aorta. Borderline to mild cardiomegaly. No pericardial effusion Mediastinum/Nodes: Midline trachea. No thyroid mass. Multiple subcentimeter mediastinal lymph nodes. Esophagus within normal limits. Lungs/Pleura: Chronic lung disease with bronchiectasis and areas of pulmonary fibrosis, worst at the bilateral lung bases. No pleural effusion or pneumothorax. Slight increased ground-glass density within the right upper lobe and superior right lower  lobe compared to the prior CT. Upper Abdomen: Liver cirrhosis. Moderate volume ascites in the abdomen. Gynecomastia. Gastroesophageal varices. Musculoskeletal: No acute or suspicious osseous abnormality. IMPRESSION: 1. Chronic lung disease as seen on the prior CT from January. Since then, slight increased ground-glass density within the right upper lobe and superior right lower lobe suspicious for superimposed mild pneumonia. 2. Liver cirrhosis with moderate volume ascites in the abdomen. Gastroesophageal varices. Electronically Signed   By: Jasmine Pang M.D.   On: 05/19/2022 03:46   DG Chest 2 View  Result Date: 05/19/2022 CLINICAL DATA:  Cough. EXAM: CHEST - 2 VIEW COMPARISON:  10/15/2012, 01/25/2022 FINDINGS: The heart size and mediastinal contours are within normal limits. Lung volumes are low. Interstitial prominence is noted bilaterally with bibasilar opacities, likely related to patient's history of pulmonary fibrosis. Increased airspace opacities are noted in the right upper lobe. No effusion or pneumothorax. Compression deformities are present in the superior endplates at L2 and L3, indeterminate in age. Degenerative changes are present in the thoracic spine. A loop recorder device is  present over the midline. IMPRESSION: 1. Increased airspace opacities in the right upper lobe, concerning for pneumonia. 2. Interstitial prominence bilaterally with bibasilar opacities, compatible with known pulmonary fibrosis. 3. Compression deformities in the superior endplates at L2 and L3, not visualized on prior exams, however indeterminate in age. Electronically Signed   By: Thornell Sartorius M.D.   On: 05/19/2022 00:33    Microbiology: Results for orders placed or performed during the hospital encounter of 05/18/22  Culture, blood (Routine X 2) w Reflex to ID Panel     Status: None (Preliminary result)   Collection Time: 05/18/22 11:29 PM   Specimen: BLOOD  Result Value Ref Range Status   Specimen Description  BLOOD BLOOD LEFT WRIST  Final   Special Requests AEROBIC BOTTLE ONLY Blood Culture adequate volume  Final   Culture   Final    NO GROWTH 1 DAY Performed at [facility], [address], [place], [place] [address]    Report Status PENDING  Incomplete  SARS Coronavirus 2 by RT PCR (hospital order, performed in [facility] hospital lab) *cepheid single result test* Anterior Nasal Swab     Status: None   Collection Time: 05/18/22 11:38 PM   Specimen: Anterior Nasal Swab  Result Value Ref Range Status   SARS Coronavirus 2 by RT PCR NEGATIVE NEGATIVE Final    Comment: Performed at [facility], [address], [place], [place] [address]  Culture, blood (Routine X 2) w Reflex to ID Panel     Status: None (Preliminary result)   Collection Time: 05/18/22 11:53 PM   Specimen: BLOOD  Result Value Ref Range Status   Specimen Description BLOOD BLOOD LEFT WRIST  Final   Special Requests   Final    BOTTLES DRAWN AEROBIC AND ANAEROBIC Blood Culture adequate volume   Culture   Final    NO GROWTH 1 DAY Performed at [facility], [address] [place], [place] [address]    Report Status PENDING  Incomplete    Labs: CBC: Recent Labs  Lab 05/18/22 2315 05/20/22 0415  WBC 7.1 6.5  HGB 11.5* 10.7*  HCT 33.9* 31.6*  MCV 111.1* 112.1*  PLT 124* 105*   Basic Metabolic Panel: Recent Labs  Lab 05/18/22 2315 05/19/22 0823 05/20/22 0415  NA 131* 134* 135  K 4.3 3.9 3.9  CL 97* 102 106  CO2 23 21* 23  GLUCOSE 332* 201* 99  BUN 30* 27* 25*  CREATININE 1.63* 1.17 1.08  CALCIUM 8.0* 8.0* 7.6*  PHOS  --  2.5  --    Liver Function Tests: Recent Labs  Lab 05/18/22 2315 05/19/22 0823  AST 61*  --   ALT 67*  --   ALKPHOS 286*  --   BILITOT 1.0  --   PROT 7.1  --   ALBUMIN 2.1* 1.8*   CBG: Recent Labs  Lab 05/19/22 1012 05/19/22 1159 05/19/22 1656 05/19/22 2138 05/20/22 0810  GLUCAP 113* 90 173* 205* 108*    Discharge time spent: greater than 30  minutes.  This record has been created using Conservation officer, historic buildings. Errors have been sought and corrected,but may not always be located. Such creation errors do not reflect on the standard of care.   Signed: Arnetha Courser, MD [facility] 05/20/2022

## 2022-05-20 NOTE — TOC Transition Note (Signed)
Transition of Care [facility] - CM/SW Discharge Note   Patient Details  Name: Richard Davenport MRN: [id number] Date of Birth: Dec 13, 1960  [facility]) CM/SW Contact:  Janae Bridgeman, RN Phone Number: 05/20/2022, 10:20 AM   Clinical Narrative:    CM met with the patient and daughter at the bedside prior to discharge to home.  The patient is planning to go home with family today and asked for resources regarding applying for [place] Medicaid.  The patient and daughter were given resource sheet to follow up and apply online for Medicaid.    The patient plans to follow up with the pulmonary MD that he currently sees for a hospital follow up.  The patient's daughter states that she was not sure if patient had a PCP - f/u given to call [facility] and wellness for PCP if needed.  I instructed the daughter to call the home and have family bring home oxygen tank for transport to home by car.   Final next level of care: Home/Self Care Barriers to Discharge: No Barriers Identified (Patient waiting on family to bring home oxygen for transport to home.)   Patient Goals and CMS Choice CMS Medicare.gov Compare Post Acute Care list provided to:: Patient Choice offered to / list presented to : Patient  Discharge Placement                         Discharge Plan and Services Additional resources added to the After Visit Summary for     Discharge Planning Services: CM Consult Post Acute Care Choice: Resumption of Svcs/PTA Provider                               Social Determinants of Health (SDOH) Interventions SDOH Screenings   Food Insecurity: No Food Insecurity (05/19/2022)  Housing: Low Risk  (05/19/2022)  Transportation Needs: No Transportation Needs (05/19/2022)  Utilities: Not At Risk (05/19/2022)  Depression (PHQ2-9): Low Risk  (02/18/2022)  Tobacco Use: Low Risk  (05/19/2022)     Readmission Risk Interventions     No data to display

## 2022-05-21 LAB — CULTURE, BLOOD (ROUTINE X 2)
Culture: NO GROWTH
Culture: NO GROWTH

## 2022-05-22 LAB — CULTURE, BLOOD (ROUTINE X 2)
Special Requests: ADEQUATE
Special Requests: ADEQUATE

## 2022-05-23 LAB — CULTURE, BLOOD (ROUTINE X 2)

## 2022-05-24 LAB — CULTURE, BLOOD (ROUTINE X 2)

## 2022-05-28 ENCOUNTER — Other Ambulatory Visit: Payer: Self-pay

## 2022-06-04 ENCOUNTER — Encounter: Payer: Self-pay | Admitting: Pulmonary Disease

## 2022-06-05 ENCOUNTER — Telehealth (HOSPITAL_COMMUNITY): Payer: Self-pay

## 2022-06-05 ENCOUNTER — Encounter (HOSPITAL_COMMUNITY): Payer: Self-pay

## 2022-06-05 NOTE — Telephone Encounter (Signed)
Attempted to call patient in regards to Pulmonary Rehab - LM on VM   Sent letter 

## 2022-06-06 ENCOUNTER — Other Ambulatory Visit: Payer: Self-pay | Admitting: Pharmacy Technician

## 2022-06-10 ENCOUNTER — Ambulatory Visit (INDEPENDENT_AMBULATORY_CARE_PROVIDER_SITE_OTHER): Payer: Commercial Managed Care - PPO

## 2022-06-10 ENCOUNTER — Other Ambulatory Visit: Payer: Self-pay

## 2022-06-10 VITALS — BP 119/79 | HR 85 | Temp 98.0°F | Resp 18 | Ht 68.0 in | Wt 167.4 lb

## 2022-06-10 DIAGNOSIS — J84112 Idiopathic pulmonary fibrosis: Secondary | ICD-10-CM

## 2022-06-10 MED ORDER — SODIUM CHLORIDE 0.9 % IV SOLN
1000.0000 mg | Freq: Once | INTRAVENOUS | Status: AC
  Administered 2022-06-10: 1000 mg via INTRAVENOUS
  Filled 2022-06-10: qty 16

## 2022-06-10 NOTE — Progress Notes (Signed)
Diagnosis: Idiopathic Pulmonary Fibrosis  Provider:  Chilton Greathouse MD  Procedure: IV Infusion  IV Type: Peripheral, IV Location: R Antecubital  Solumedrol (Methylprednisolone), Dose: 1000 mg  Infusion Start Time: 1407  Infusion Stop Time: 1508  Post Infusion IV Care: Peripheral IV Discontinued  Discharge: Condition: Good, Destination: Home . AVS Provided  Performed by:  Adriana Mccallum, RN

## 2022-06-10 NOTE — Patient Instructions (Signed)
Methylprednisolone Solution Injection What is this medication? METHYLPREDNISOLONE (meth ill pred NISS oh lone) treats many conditions such as asthma, allergic reactions, arthritis, inflammatory bowel diseases, adrenal, and blood or bone marrow disorders. It works by decreasing inflammation, slowing down an overactive immune system, or replacing cortisol normally made in the body. Cortisol is a hormone that plays an important role in how the body responds to stress, illness, and injury. It belongs to a group of medications called steroids. This medicine may be used for other purposes; ask your health care provider or pharmacist if you have questions. COMMON BRAND NAME(S): A-Methapred, Solu-Medrol What should I tell my care team before I take this medication? They need to know if you have any of these conditions: Cushing's syndrome Eye disease, vision problems Diabetes Glaucoma Heart disease High blood pressure Infection especially a viral infection, such as chickenpox, cold sores, or herpes Liver disease Mental health conditions Myasthenia gravis Osteoporosis Recent or upcoming vaccine Seizures Stomach or intestine problems Thyroid disease An unusual or allergic reaction to lactose, methylprednisolone, other medications, foods, dyes, or preservatives Pregnant or trying to get pregnant Breastfeeding How should I use this medication? This medication is for injection or infusion into a vein. It is also for injection into a muscle. It is given by your care team in a hospital or clinic setting. Talk to your care team about the use of this medication in children. While this medication may be prescribed for selected conditions, precautions do apply. Overdosage: If you think you have taken too much of this medicine contact a poison control center or emergency room at once. NOTE: This medicine is only for you. Do not share this medicine with others. What if I miss a dose? This does not  apply. What may interact with this medication? Do not take this medication with any of the following: Alefacept Echinacea Iopamidol Live virus vaccines Metyrapone Mifepristone This medication may also interact with the following: Amphotericin B Aspirin and aspirin-like medications Certain antibiotics, such as erythromycin, clarithromycin, troleandomycin Certain medications for diabetes Certain medications for fungal infections, such as ketoconazole Certain medications for seizures, such as carbamazepine, phenobarbital, phenytoin Certain medications that treat or prevent blood clots, such as warfarin Cyclosporine Digoxin Diuretics Estrogen or progestin hormones Isoniazid NSAIDS, medications for pain and inflammation, such as ibuprofen or naproxen Other medications for myasthenia gravis Rifampin Vaccines This list may not describe all possible interactions. Give your health care provider a list of all the medicines, herbs, non-prescription drugs, or dietary supplements you use. Also tell them if you smoke, drink alcohol, or use illegal drugs. Some items may interact with your medicine. What should I watch for while using this medication? Tell your care team if your symptoms do not start to get better or if they get worse. Do not stop taking except on your care team's advice. You may develop a severe reaction. Your care team will tell you how much medication to take. Your condition will be monitored carefully while you are receiving this medication. This medication may increase your risk of getting an infection. Tell your care team if you are around anyone with measles or chickenpox, or if you develop sores or blisters that do not heal properly. This medication may increase blood sugar. Ask your care team if changes in diet or medications are needed if you have diabetes. Tell your care team right away if you have any change in your eyesight. Using this medication for a long time may  increase your risk of   low bone mass. Talk to your care team about bone health. What side effects may I notice from receiving this medication? Side effects that you should report to your care team as soon as possible: Allergic reactions--skin rash, itching, hives, swelling of the face, lips, tongue, or throat Cushing syndrome--increased fat around the midsection, upper back, neck, or face, pink or purple stretch marks on the skin, thinning, fragile skin that easily bruises, unexpected hair growth High blood sugar (hyperglycemia)--increased thirst or amount of urine, unusual weakness or fatigue, blurry vision Increase in blood pressure Infection--fever, chills, cough, sore throat, wounds that don't heal, pain or trouble when passing urine, general feeling of discomfort or being unwell Low adrenal gland function--nausea, vomiting, loss of appetite, unusual weakness or fatigue, dizziness Mood and behavior changes--anxiety, nervousness, confusion, hallucinations, irritability, hostility, thoughts of suicide or self-harm, worsening mood, feelings of depression Stomach bleeding--bloody or black, tar-like stools, vomiting blood or brown material that looks like coffee grounds Swelling of the ankles, hands, or feet Side effects that usually do not require medical attention (report to your care team if they continue or are bothersome): Acne General discomfort and fatigue Headache Increase in appetite Nausea Trouble sleeping Weight gain This list may not describe all possible side effects. Call your doctor for medical advice about side effects. You may report side effects to FDA at 1-800-FDA-1088. Where should I keep my medication? This medication is given in a hospital or clinic and will not be stored at home. NOTE: This sheet is a summary. It may not cover all possible information. If you have questions about this medicine, talk to your doctor, pharmacist, or health care provider.  2023 Elsevier/Gold  Standard (2020-03-13 00:00:00)  

## 2022-06-11 ENCOUNTER — Ambulatory Visit (INDEPENDENT_AMBULATORY_CARE_PROVIDER_SITE_OTHER): Payer: Commercial Managed Care - PPO | Admitting: Allergy and Immunology

## 2022-06-11 ENCOUNTER — Other Ambulatory Visit: Payer: Self-pay

## 2022-06-11 ENCOUNTER — Encounter: Payer: Self-pay | Admitting: Allergy and Immunology

## 2022-06-11 ENCOUNTER — Ambulatory Visit (INDEPENDENT_AMBULATORY_CARE_PROVIDER_SITE_OTHER): Payer: Commercial Managed Care - PPO

## 2022-06-11 VITALS — BP 116/73 | HR 104 | Temp 98.2°F | Resp 18 | Ht 68.0 in | Wt 168.8 lb

## 2022-06-11 VITALS — BP 110/70 | HR 92 | Temp 99.1°F | Resp 20 | Ht 67.5 in | Wt 168.2 lb

## 2022-06-11 DIAGNOSIS — J84112 Idiopathic pulmonary fibrosis: Secondary | ICD-10-CM | POA: Diagnosis not present

## 2022-06-11 DIAGNOSIS — K7460 Unspecified cirrhosis of liver: Secondary | ICD-10-CM | POA: Diagnosis not present

## 2022-06-11 DIAGNOSIS — K219 Gastro-esophageal reflux disease without esophagitis: Secondary | ICD-10-CM

## 2022-06-11 DIAGNOSIS — R768 Other specified abnormal immunological findings in serum: Secondary | ICD-10-CM | POA: Diagnosis not present

## 2022-06-11 DIAGNOSIS — R748 Abnormal levels of other serum enzymes: Secondary | ICD-10-CM | POA: Diagnosis not present

## 2022-06-11 MED ORDER — AIRSUPRA 90-80 MCG/ACT IN AERO: 2.0000 | INHALATION_SPRAY | RESPIRATORY_TRACT | 1 refills | Status: DC | PRN

## 2022-06-11 MED ORDER — SODIUM CHLORIDE 0.9 % IV SOLN
1000.0000 mg | Freq: Once | INTRAVENOUS | Status: AC
  Administered 2022-06-11: 1000 mg via INTRAVENOUS
  Filled 2022-06-11: qty 16

## 2022-06-11 NOTE — Progress Notes (Signed)
[place] - [place] - [place] - [place] - [place]   Dear Marchelle Gearing,  Thank you for referring Donterious Batts to the [facility] [place] on 06/11/2022.   Below is a summation of this patient's evaluation and recommendations.  Thank you for your referral. I will keep you informed about this patient's response to treatment.   If you have any questions please do not hesitate to contact me.   Sincerely,  Jessica Priest, MD Allergy / Immunology [facility]   ______________________________________________________________________    NEW PATIENT NOTE  Referring Provider: Kalman Shan, MD Primary Provider: Pcp, No Date of office visit: 06/11/2022    Subjective:   Chief Complaint:  Richard Davenport (DOB: 04-10-1960) is a 62 y.o. male who presents to the clinic on 06/11/2022 with a chief complaint of Asthma .     HPI: Welch presents to this clinic in evaluation of shortness of breath.  He notes that over the course of the past several months he has become very short of breath if he exerts himself to any extent.  In 2023 he never had any of this problem and he could work full-time and exert himself to any extent that he so desired without any shortness of breath.  Currently he will use oxygen if he exerts himself to any significant degree.  He also sleeps with oxygen.  There is not really an obvious provoking factor that is responsible for this issue.  His environment has not really changed.  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  He does have reflux with regurgitation a few times per week for which she will use omeprazole a few times per week while he consumes about 3 cups of caffeine per day.  He does have some slight cough in the morning.  He does not really wheeze.  He does not have any significant upper airway symptoms.  He has been given Symbicort which gave rise to  laryngitis.  He has been given what sounds like mycophenolate which made him worse.  When he uses albuterol it does help his breathing.  He will use albuterol prior to exertion.  Past Medical History:  Diagnosis Date  . Asthma   . Diabetes mellitus without complication (HCC)   . Headache   . Idiopathic pulmonary fibrosis (HCC)   . Stroke (HCC)   . Subarachnoid hemorrhage [facility])     Past Surgical History:  Procedure Laterality Date  . ANEURYSM COILING     for bleed  . LOOP RECORDER INSERTION N/A 04/10/2018   Procedure: LOOP RECORDER INSERTION;  Surgeon: Duke Salvia, MD;  Location: [facility] INVASIVE CV LAB;  Service: Cardiovascular;  Laterality: N/A;  . RADIOLOGY WITH ANESTHESIA N/A 10/09/2012   Procedure: RADIOLOGY WITH ANESTHESIA;  Surgeon: Lisbeth Renshaw, MD;  Location: [facility] OR;  Service: Radiology;  Laterality: N/A;  . RADIOLOGY WITH ANESTHESIA N/A 05/27/2013   Procedure: RADIOLOGY WITH ANESTHESIA;  Surgeon: Lisbeth Renshaw, MD;  Location: [facility] OR;  Service: Radiology;  Laterality: N/A;    Allergies as of 06/11/2022       Reactions   Pork-derived Products Other (See Comments)   Patient is Muslim and PREFERS TO NOT TAKE ANY PORK OR MEAT PRODUCTS (only fish)        Medication List    albuterol 108 (90 Base) MCG/ACT inhaler Commonly known as: VENTOLIN HFA Inhale 2 puffs into the lungs every 6 (  six) hours as needed for wheezing or shortness of breath.   furosemide 40 MG tablet Commonly known as: LASIX Take 0.5 tablets (20 mg total) by mouth daily.   metFORMIN 500 MG tablet Commonly known as: GLUCOPHAGE Take 1 tablet (500 mg total) by mouth 2 (two) times daily. What changed: when to take this   mycophenolate 500 MG tablet Commonly known as: CELLCEPT Take 1,000 mg by mouth 2 (two) times daily.   Pirfenidone 267 MG Tabs Take 3 tablets (801 mg total) by mouth with breakfast, with lunch, and with evening meal. Month 2 and onwards   predniSONE 10 MG tablet Commonly known as:  DELTASONE Take 10-30 mg by mouth See admin instructions. Take 3 tablets (30 mg total) by mouth once daily for 14 days, THEN 2 tablets (20 mg total) once daily for 30 days, THEN 1 tablet (10 mg total) once daily for 30 days.   spironolactone 50 MG tablet Commonly known as: ALDACTONE Take 50 mg by mouth daily.    Review of systems negative except as noted in HPI / PMHx or noted below:  Review of Systems  Constitutional: Negative.   HENT: Negative.    Eyes: Negative.   Respiratory: Negative.    Cardiovascular: Negative.   Gastrointestinal: Negative.   Genitourinary: Negative.   Musculoskeletal: Negative.   Skin: Negative.   Neurological: Negative.   Endo/Heme/Allergies: Negative.   Psychiatric/Behavioral: Negative.      Family History  Problem Relation Age of Onset  . Hypertension Mother   . Hypertension Father     Social History   Socioeconomic History  . Marital status: Married    Spouse name: Louanna Raw  . Number of children: 4  . Years of education: 19  . Highest education level: Not on file  Occupational History  . Not on file  Tobacco Use  . Smoking status: Never    Passive exposure: Current  . Smokeless tobacco: Never  Vaping Use  . Vaping Use: Never used  Substance and Sexual Activity  . Alcohol use: No  . Drug use: No  . Sexual activity: Not Currently  Other Topics Concern  . Not on file  Social History Narrative   Still working Location manager, 4 children, watches TV   Social Determinants of Health   Financial Resource Strain: Not on file  Food Insecurity: No Food Insecurity (05/19/2022)   Hunger Vital Sign   . Worried About Programme researcher, broadcasting/film/video in the Last Year: Never true   . Ran Out of Food in the Last Year: Never true  Transportation Needs: No Transportation Needs (05/19/2022)   PRAPARE - Transportation   . Lack of Transportation (Medical): No   . Lack of Transportation (Non-Medical): No  Physical Activity: Not on file  Stress: Not on file   Social Connections: Not on file  Intimate Partner Violence: Unknown (05/19/2022)   Humiliation, Afraid, Rape, and Kick questionnaire   . Fear of Current or Ex-Partner: Patient declined   . Emotionally Abused: Patient declined   . Physically Abused: Not on file   . Sexually Abused: Patient declined    Environmental and Social history   Objective:   Vitals:   06/11/22 1430  BP: 110/70  Pulse: 92  Resp: 20  Temp: 99.1 F (37.3 C)  SpO2: 93%   Height: 5' 7.5" (171.5 cm) Weight: 168 lb 3.2 oz (76.3 kg)  Physical Exam Constitutional:      Appearance: He is not diaphoretic.  HENT:  Head: Normocephalic.     Right Ear: Tympanic membrane, ear canal and external ear normal.     Left Ear: Tympanic membrane, ear canal and external ear normal.     Nose: Nose normal. No mucosal edema or rhinorrhea.     Mouth/Throat:     Pharynx: Uvula midline. No oropharyngeal exudate.  Eyes:     Conjunctiva/sclera: Conjunctivae normal.  Neck:     Thyroid: No thyromegaly.     Trachea: Trachea normal. No tracheal tenderness or tracheal deviation.  Cardiovascular:     Rate and Rhythm: Normal rate and regular rhythm.     Heart sounds: Normal heart sounds, S1 normal and S2 normal. No murmur heard. Pulmonary:     Effort: No respiratory distress.     Breath sounds: Normal breath sounds. No stridor. No wheezing (Wide spread inspiratory crackles) or rales.  Lymphadenopathy:     Head:     Right side of head: No tonsillar adenopathy.     Left side of head: No tonsillar adenopathy.     Cervical: No cervical adenopathy.  Skin:    Findings: No erythema or rash.     Nails: There is no clubbing.  Neurological:     Mental Status: He is alert.    Diagnostics: Allergy skin tests were performed.   Spirometry was performed and demonstrated an FEV1 of 1.28 @ 41 % of predicted. FEV1/FVC = 0.84  The patient had an Asthma Control Test with the following results: ACT Total Score: 13.    Results of the  chest CT scan obtained 19 May 2022 identified the following:  Cardiovascular: Limited evaluation without intravenous contrast. Nonaneurysmal aorta. Borderline to mild cardiomegaly. No pericardial effusion   Mediastinum/Nodes: Midline trachea. No thyroid mass. Multiple subcentimeter mediastinal lymph nodes. Esophagus within normal limits.   Lungs/Pleura: Chronic lung disease with bronchiectasis and areas of pulmonary fibrosis, worst at the bilateral lung bases. No pleural effusion or pneumothorax. Slight increased ground-glass density within the right upper lobe and superior right lower lobe compared to the prior CT.   Upper Abdomen: Liver cirrhosis. Moderate volume ascites in the abdomen. Gynecomastia. Gastroesophageal varices.  Results of blood tests obtained 20 May 2022 demonstrates WBC 6.5, absolute eosinophil 200, absolute lymphocyte 1700, hemoglobin 10.7, platelet 105  Results of blood tests obtained 26 March 2022 identifies IgE 1589 KU/L, IgE antibodies directed against dust mite, dog, cockroach, grasses.  Results of blood tests obtained 26 December 2021 identifies ANA positive at 1: 40 with negative reflex  Results of blood tests obtained 23 May 2022 identifies IgG 2160 mg/dl, IgA 161 mg/dl, WRU04 mg/dl, IgE 5409 KU/L  Results of blood tests obtained 17 May 2022 identifies ALT 63 U/L, AST 66 U/L, alkaline phosphatase 322 U/L, total bilirubin 1.3 mg/DL   Assessment and Plan:    1. Idiopathic pulmonary fibrosis (HCC)   2. Cirrhosis of liver without ascites, unspecified hepatic cirrhosis type (HCC)   3. Elevated IgE level   4. Abnormal liver enzymes     Patient Instructions   1. Return for skin testing:  2. Continue oxygen during exertion and while asleep  3. Use AirSupra - 2 inhalations every 4-6 hours if needed w/ spacer (empty lungs)  4. AirSupra takes the place of albuterol  5. Blood - alpha-1 antitrypsin level and phenotype  6.   7.   8.   Jessica Priest, MD Allergy / Immunology [facility] [place]

## 2022-06-11 NOTE — Patient Instructions (Addendum)
  1. Return for skin testing:  2. Continue oxygen during exertion and while asleep  3. Use AirSupra - 2 inhalations every 4-6 hours if needed w/ spacer (empty lungs)  4. AirSupra takes the place of albuterol  5. Blood - alpha-1 antitrypsin level and phenotype

## 2022-06-11 NOTE — Progress Notes (Signed)
Diagnosis: Idopathic Pulmonary Fibrosis  Provider:  Chilton Greathouse MD  Procedure: IV Infusion  IV Type: Peripheral, IV Location: L Hand  Solumedrol (Methylprednisolone), Dose: 1000 mg  Infusion Start Time: 1114  Infusion Stop Time: 1218  Post Infusion IV Care: Peripheral IV Discontinued  Discharge: Condition: Good, Destination: Home . AVS Declined  Performed by:  Adriana Mccallum, RN

## 2022-06-12 ENCOUNTER — Encounter: Payer: Self-pay | Admitting: Allergy and Immunology

## 2022-06-12 ENCOUNTER — Telehealth: Payer: Self-pay | Admitting: *Deleted

## 2022-06-12 ENCOUNTER — Telehealth: Payer: Self-pay | Admitting: Pharmacy Technician

## 2022-06-12 ENCOUNTER — Ambulatory Visit (INDEPENDENT_AMBULATORY_CARE_PROVIDER_SITE_OTHER): Payer: Commercial Managed Care - PPO

## 2022-06-12 VITALS — BP 118/75 | HR 78 | Temp 97.8°F | Resp 18

## 2022-06-12 DIAGNOSIS — J84112 Idiopathic pulmonary fibrosis: Secondary | ICD-10-CM | POA: Diagnosis not present

## 2022-06-12 MED ORDER — ACETAMINOPHEN 325 MG PO TABS
650.0000 mg | ORAL_TABLET | Freq: Once | ORAL | Status: AC
  Administered 2022-06-12: 650 mg via ORAL
  Filled 2022-06-12: qty 2

## 2022-06-12 MED ORDER — SODIUM CHLORIDE 0.9 % IV SOLN
1000.0000 mg | Freq: Once | INTRAVENOUS | Status: AC
  Administered 2022-06-12: 1000 mg via INTRAVENOUS
  Filled 2022-06-12: qty 100

## 2022-06-12 MED ORDER — SODIUM CHLORIDE 0.9 % IV SOLN
1000.0000 mg | Freq: Once | INTRAVENOUS | Status: AC
  Administered 2022-06-12: 1000 mg via INTRAVENOUS
  Filled 2022-06-12: qty 16

## 2022-06-12 MED ORDER — DIPHENHYDRAMINE HCL 25 MG PO CAPS
50.0000 mg | ORAL_CAPSULE | Freq: Once | ORAL | Status: AC
  Administered 2022-06-12: 50 mg via ORAL
  Filled 2022-06-12: qty 2

## 2022-06-12 MED ORDER — METHYLPREDNISOLONE SODIUM SUCC 125 MG IJ SOLR: 125.0000 mg | Freq: Once | INTRAMUSCULAR | Status: DC

## 2022-06-12 NOTE — Addendum Note (Signed)
Addended by: Dollene Cleveland R on: 06/12/2022 11:15 AM   Modules accepted: Orders

## 2022-06-12 NOTE — Telephone Encounter (Signed)
Auth Submission: APPROVED Site of care: Site of care: [facility] Payer: [employer] Medication & CPT/J Code(s) submitted: Truxima (Rituximab-abbs) [phone] Route of submission (phone, fax, portal): PORTAL Phone # Fax # Auth type: Buy/Bill Units/visits requested: 1000MG  X2 DOSES Reference number: [phone] Approval from: 05/28/22 to 05/28/23

## 2022-06-12 NOTE — Addendum Note (Signed)
Addended by: Dollene Cleveland R on: 06/12/2022 11:30 AM   Modules accepted: Orders

## 2022-06-12 NOTE — Telephone Encounter (Signed)
Please inform Richard Davenport that he needs to take his omeprazole EVERY day. Please have Richard Davenport for short telomere added to yesterdays blood test.   Please arrange for Richard Davenport to have a portable oxygen device (shoulder carry) that his can use during ambulation. All he has now is a wheel behind canister which he does not use during ambulation. Lets see if we can get that arranged today.    Order has been placed. Called and spoke with patients daughter who is in charge of his care, she states that she will make sure he takes the Omeprazole everyday and advised of the blood work. Patients daughter verbalized understanding and he is going to have an infusion today at the Pulmonologist office and will see if he can have his blood drawn there, if not she will bring him by. I placed the order for the portable oxygen concentrator and sent Richard Davenport with Richard Davenport an e-mail to help get that started for the patient. I did advise to the patients daughter as well about the Portable Oxygen Concentrator and she stated that they have denied it in the past because of how low his oxygen drops while walking, I did advise that we will try to see what we can do to get this covered. Waiting to hear back from Richard Davenport currently.

## 2022-06-12 NOTE — Progress Notes (Signed)
Diagnosis: Idiopathic Pulmonary Fibrosis (IPF)  Provider:  Chilton Greathouse MD  Procedure: IV Infusion  IV Type: Peripheral, IV Location: R Antecubital   Solumedrol (Methylprednisolone), Dose: 1000 mg  Infusion Start Time: 1351  Infusion Stop Time: 1451  Truxima (Rituximab-abbs), Dose: 300 mg  Infusion Start Time: 1455  Infusion Stop Time: 1712  Post Infusion IV Care: Observation period completed  Discharge: Condition: Good, Destination: Home . AVS Provided  Performed by:  Marilynn Rail, RN

## 2022-06-13 ENCOUNTER — Ambulatory Visit (INDEPENDENT_AMBULATORY_CARE_PROVIDER_SITE_OTHER): Payer: Commercial Managed Care - PPO

## 2022-06-13 ENCOUNTER — Other Ambulatory Visit (INDEPENDENT_AMBULATORY_CARE_PROVIDER_SITE_OTHER): Payer: Commercial Managed Care - PPO

## 2022-06-13 VITALS — BP 121/78 | HR 59 | Temp 97.4°F | Resp 18 | Ht 68.0 in | Wt 170.0 lb

## 2022-06-13 DIAGNOSIS — J84112 Idiopathic pulmonary fibrosis: Secondary | ICD-10-CM | POA: Diagnosis not present

## 2022-06-13 LAB — COMPREHENSIVE METABOLIC PANEL
ALT: 77 U/L — ABNORMAL HIGH (ref 0–53)
AST: 46 U/L — ABNORMAL HIGH (ref 0–37)
Albumin: 2.4 g/dL — ABNORMAL LOW (ref 3.5–5.2)
Alkaline Phosphatase: 253 U/L — ABNORMAL HIGH (ref 39–117)
BUN: 38 mg/dL — ABNORMAL HIGH (ref 6–23)
CO2: 30 mEq/L (ref 19–32)
Calcium: 8.9 mg/dL (ref 8.4–10.5)
Chloride: 103 mEq/L (ref 96–112)
Creatinine, Ser: 1.1 mg/dL (ref 0.40–1.50)
GFR: 73.3 mL/min (ref >60.00)
Glucose, Bld: 224 mg/dL — ABNORMAL HIGH (ref 70–99)
Potassium: 4.5 mEq/L (ref 3.5–5.1)
Sodium: 138 mEq/L (ref 135–145)
Total Bilirubin: 0.7 mg/dL (ref 0.2–1.2)
Total Protein: 7.4 g/dL (ref 6.0–8.3)

## 2022-06-13 LAB — CBC WITH DIFFERENTIAL/PLATELET
Basophils Absolute: 0 10*3/uL (ref 0.0–0.1)
Basophils Relative: 0 % (ref 0.0–3.0)
Eosinophils Absolute: 0 10*3/uL (ref 0.0–0.7)
Eosinophils Relative: 0 % (ref 0.0–5.0)
HCT: 36.9 % — ABNORMAL LOW (ref 39.0–52.0)
Hemoglobin: 11.7 g/dL — ABNORMAL LOW (ref 13.0–17.0)
Lymphocytes Relative: 2.2 % — ABNORMAL LOW (ref 12.0–46.0)
Lymphs Abs: 0.1 10*3/uL — ABNORMAL LOW (ref 0.7–4.0)
MCHC: 31.6 g/dL (ref 30.0–36.0)
MCV: 111.1 fl — ABNORMAL HIGH (ref 78.0–100.0)
Monocytes Absolute: 0.4 10*3/uL (ref 0.1–1.0)
Monocytes Relative: 6.1 % (ref 3.0–12.0)
Neutro Abs: 5.8 10*3/uL (ref 1.4–7.7)
Neutrophils Relative %: 91.3 % — ABNORMAL HIGH (ref 43.0–77.0)
Platelets: 107 10*3/uL — ABNORMAL LOW (ref 150.0–400.0)
RBC: 3.32 Mil/uL — ABNORMAL LOW (ref 4.22–5.81)
RDW: 15.4 % (ref 11.5–15.5)
WBC: 6.4 10*3/uL (ref 4.0–10.5)

## 2022-06-13 LAB — C-REACTIVE PROTEIN: CRP: 1.1 mg/dL (ref 0.5–20.0)

## 2022-06-13 MED ORDER — DIPHENHYDRAMINE HCL 25 MG PO CAPS
50.0000 mg | ORAL_CAPSULE | Freq: Once | ORAL | Status: AC
  Administered 2022-06-13: 50 mg via ORAL
  Filled 2022-06-13: qty 2

## 2022-06-13 MED ORDER — SODIUM CHLORIDE 0.9 % IV SOLN
700.0000 mg | Freq: Once | INTRAVENOUS | Status: AC
  Administered 2022-06-13: 700 mg via INTRAVENOUS
  Filled 2022-06-13: qty 70

## 2022-06-13 MED ORDER — METHYLPREDNISOLONE SODIUM SUCC 125 MG IJ SOLR
125.0000 mg | Freq: Once | INTRAMUSCULAR | Status: AC
  Administered 2022-06-13: 125 mg via INTRAVENOUS
  Filled 2022-06-13: qty 2

## 2022-06-13 MED ORDER — ACETAMINOPHEN 325 MG PO TABS
650.0000 mg | ORAL_TABLET | Freq: Once | ORAL | Status: AC
  Administered 2022-06-13: 650 mg via ORAL
  Filled 2022-06-13: qty 2

## 2022-06-13 NOTE — Progress Notes (Signed)
Diagnosis: Idiopathic Pulmonary Fibrosis (IPF)  Provider:  Chilton Greathouse MD  Procedure: IV Infusion  IV Type: Peripheral, IV Location: R Antecubital  Truxima (Rituximab-abbs), Dose: 700mg   Infusion Start Time: 1200  Infusion Stop Time: 1544  Post Infusion IV Care: Observation period completed  Discharge: Condition: Good, Destination: Home . AVS Provided  Performed by:  Marilynn Rail, RN

## 2022-06-17 LAB — ALPHA-1-ANTITRYPSIN PHENOTYP: A-1 Antitrypsin: 138 mg/dL (ref 101–187)

## 2022-06-21 ENCOUNTER — Other Ambulatory Visit (HOSPITAL_BASED_OUTPATIENT_CLINIC_OR_DEPARTMENT_OTHER): Payer: Self-pay | Admitting: Pulmonary Disease

## 2022-06-24 ENCOUNTER — Telehealth (HOSPITAL_COMMUNITY): Payer: Self-pay

## 2022-06-24 NOTE — Telephone Encounter (Signed)
No response from pt in regards to pulmonary rehab. Closed referral. 

## 2022-06-26 ENCOUNTER — Ambulatory Visit: Payer: Commercial Managed Care - PPO | Admitting: Family

## 2022-06-27 ENCOUNTER — Other Ambulatory Visit (INDEPENDENT_AMBULATORY_CARE_PROVIDER_SITE_OTHER): Payer: Commercial Managed Care - PPO

## 2022-06-27 ENCOUNTER — Ambulatory Visit (INDEPENDENT_AMBULATORY_CARE_PROVIDER_SITE_OTHER): Payer: Commercial Managed Care - PPO

## 2022-06-27 ENCOUNTER — Other Ambulatory Visit: Payer: Commercial Managed Care - PPO

## 2022-06-27 VITALS — BP 119/80 | HR 60 | Temp 97.8°F | Resp 18 | Ht 68.0 in | Wt 170.0 lb

## 2022-06-27 DIAGNOSIS — J84112 Idiopathic pulmonary fibrosis: Secondary | ICD-10-CM

## 2022-06-27 LAB — COMPREHENSIVE METABOLIC PANEL
ALT: 65 U/L — ABNORMAL HIGH (ref 0–53)
AST: 64 U/L — ABNORMAL HIGH (ref 0–37)
Albumin: 2.3 g/dL — ABNORMAL LOW (ref 3.5–5.2)
Alkaline Phosphatase: 301 U/L — ABNORMAL HIGH (ref 39–117)
BUN: 22 mg/dL (ref 6–23)
CO2: 28 mEq/L (ref 19–32)
Calcium: 8 mg/dL — ABNORMAL LOW (ref 8.4–10.5)
Chloride: 102 mEq/L (ref 96–112)
Creatinine, Ser: 0.95 mg/dL (ref 0.40–1.50)
GFR: 87.38 mL/min (ref >60.00)
Glucose, Bld: 143 mg/dL — ABNORMAL HIGH (ref 70–99)
Potassium: 3.5 mEq/L (ref 3.5–5.1)
Sodium: 137 mEq/L (ref 135–145)
Total Bilirubin: 1 mg/dL (ref 0.2–1.2)
Total Protein: 6.8 g/dL (ref 6.0–8.3)

## 2022-06-27 LAB — CBC
HCT: 36.3 % — ABNORMAL LOW (ref 39.0–52.0)
Hemoglobin: 11.6 g/dL — ABNORMAL LOW (ref 13.0–17.0)
MCHC: 32 g/dL (ref 30.0–36.0)
MCV: 107.9 fl — ABNORMAL HIGH (ref 78.0–100.0)
Platelets: 114 10*3/uL — ABNORMAL LOW (ref 150.0–400.0)
RBC: 3.36 Mil/uL — ABNORMAL LOW (ref 4.22–5.81)
RDW: 15.7 % — ABNORMAL HIGH (ref 11.5–15.5)
WBC: 6.6 10*3/uL (ref 4.0–10.5)

## 2022-06-27 MED ORDER — SODIUM CHLORIDE 0.9 % IV SOLN
1000.0000 mg | Freq: Once | INTRAVENOUS | Status: AC
  Administered 2022-06-27: 1000 mg via INTRAVENOUS
  Filled 2022-06-27: qty 100

## 2022-06-27 MED ORDER — SODIUM CHLORIDE 0.9 % IV SOLN: 1000.0000 mg | Freq: Once | INTRAVENOUS | Status: DC

## 2022-06-27 MED ORDER — METHYLPREDNISOLONE SODIUM SUCC 125 MG IJ SOLR
125.0000 mg | Freq: Once | INTRAMUSCULAR | Status: AC
  Administered 2022-06-27: 125 mg via INTRAVENOUS
  Filled 2022-06-27: qty 2

## 2022-06-27 MED ORDER — ACETAMINOPHEN 325 MG PO TABS
650.0000 mg | ORAL_TABLET | Freq: Once | ORAL | Status: AC
  Administered 2022-06-27: 650 mg via ORAL
  Filled 2022-06-27: qty 2

## 2022-06-27 MED ORDER — DIPHENHYDRAMINE HCL 25 MG PO CAPS
50.0000 mg | ORAL_CAPSULE | Freq: Once | ORAL | Status: AC
  Administered 2022-06-27: 50 mg via ORAL
  Filled 2022-06-27: qty 2

## 2022-06-27 NOTE — Progress Notes (Signed)
Diagnosis: Idiopathic Pulmonary Fibrosis (IPF)  Provider:  Praveen Mannam MD  Procedure: IV Infusion  IV Type: Peripheral, IV Location: R Forearm  Truxima (Rituximab-abbs), Dose: 1000 mg  Infusion Start Time: 1130  Infusion Stop Time: 1455  Post Infusion IV Care: Peripheral IV Discontinued  Discharge: Condition: Good, Destination: Home . AVS Declined  Performed by:  Cylus Douville, RN    

## 2022-06-27 NOTE — Progress Notes (Deleted)
Diagnosis: Idiopathic Pulmonary Fibrosis (IPF)  Provider:  Chilton Greathouse MD  Procedure: IV Infusion  IV Type: Peripheral, IV Location: R Forearm  Rituxan (Rituximab), Dose: 1000 mg  Infusion Start Time: 1130  Infusion Stop Time: 1455  Post Infusion IV Care: Peripheral IV Discontinued  Discharge: Condition: Good, Destination: Home . AVS Provided  Performed by:  Garnette Czech, RN

## 2022-06-27 NOTE — Progress Notes (Deleted)
Diagnosis: Idiopathic Pulmonary Fibrosis (IPF)  Provider:  Chilton Greathouse MD  Procedure: IV Infusion  IV Type: Peripheral, IV Location: R Forearm  Truxima (Rituximab-abbs), Dose: 1000 mg  Infusion Start Time: 1130  Infusion Stop Time: 1455  Post Infusion IV Care: Peripheral IV Discontinued  Discharge: Condition: Good, Destination: Home . AVS Declined  Performed by:  Adriana Mccallum, RN

## 2022-07-01 ENCOUNTER — Ambulatory Visit (INDEPENDENT_AMBULATORY_CARE_PROVIDER_SITE_OTHER): Payer: Commercial Managed Care - PPO | Admitting: Internal Medicine

## 2022-07-01 ENCOUNTER — Encounter: Payer: Self-pay | Admitting: Internal Medicine

## 2022-07-01 ENCOUNTER — Telehealth: Payer: Self-pay | Admitting: *Deleted

## 2022-07-01 VITALS — BP 130/80 | HR 99 | Ht 68.0 in | Wt 164.0 lb

## 2022-07-01 DIAGNOSIS — Z79899 Other long term (current) drug therapy: Secondary | ICD-10-CM | POA: Diagnosis not present

## 2022-07-01 DIAGNOSIS — R898 Other abnormal findings in specimens from other organs, systems and tissues: Secondary | ICD-10-CM

## 2022-07-01 DIAGNOSIS — J849 Interstitial pulmonary disease, unspecified: Secondary | ICD-10-CM

## 2022-07-01 DIAGNOSIS — R768 Other specified abnormal immunological findings in serum: Secondary | ICD-10-CM

## 2022-07-01 DIAGNOSIS — Z87898 Personal history of other specified conditions: Secondary | ICD-10-CM

## 2022-07-01 DIAGNOSIS — J9611 Chronic respiratory failure with hypoxia: Secondary | ICD-10-CM

## 2022-07-01 DIAGNOSIS — R5383 Other fatigue: Secondary | ICD-10-CM

## 2022-07-01 DIAGNOSIS — Z5181 Encounter for therapeutic drug level monitoring: Secondary | ICD-10-CM

## 2022-07-01 NOTE — Patient Instructions (Addendum)
ICD-10-CM   1. ILD (interstitial lung disease) (HCC)  J84.9     2. Other fatigue  R53.83     3. Encounter for therapeutic drug monitoring  Z51.81     4. High risk medication use  Z79.899     5. Encounter for monitoring immunosuppressive medication therapy causing immunodeficiency (HCC)  D84.821    Z51.81    Z79.60      ILD (interstitial lung disease) (HCC) Encounter for therapeutic drug monitoring High risk medication use Encounter for monitoring immunosuppressive medication therapy causing immunodeficiency (HCC)  - glad you saw Dr Josph Macho at [facility] May 24, 2022 and plan to see her again in August 2024.  -Currently off CellCept - Recent treatment with Rituxan per Dr. Annitta Needs but given at [facility] - Noted back on pirfenidone and tolerating it well at full dose. -Liver enzymes mildly and persistently elevated in June 2024   Plan -Recheck liver function test in 1 month -[facility] to remove CellCept from the [facility] - Continue Esbriet  according to directions from [facility] - [facility] will do standing order for CBC, liver function test and chemistry every 4 weeks for the next 3 months  -You can come to our lab and get it done - for now PFT mon[facility] at [facility]  - can consider clinical trials in the future -Await results of telomere testing done at [facility] through [facility]  Coughing and wheezing with elevated IgE  -Currently under remission  Plan - Keep follow-up with Dr. Lucie Leather   Abnormal liver CT and cirrhosis with ascites and MELD score  - -Noted [facility] said you not a candidate for lung transplantation in May/June 2024  Plan  - Keep GI visit at [facility]  Followup -   -October 2024 DR Mariaelena Cade - 30 min visit [will change follow-up to 6-8 weeks; decision made after he left]  - symptom score and simple wak test  -Administer ILD question at next visit

## 2022-07-01 NOTE — Telephone Encounter (Signed)
-----   Message from Orson Aloe, [place] sent at 06/27/2022  8:55 AM EDT ----- Can you please ask Rachelle about this lab per Dr. Lucie Leather. ----- Message ----- From: Jessica Priest, MD Sent: 06/27/2022   8:40 AM EDT To: [facility]  Can we check on status of FISH oncology blood test? Was it collected?

## 2022-07-01 NOTE — Progress Notes (Signed)
OV 03/26/2022 -transferred to the ILD center with Dr. Marchelle Gearing by Dr. Val Eagle.  Patient will "be comanaged with [facility] Dr. Raynelle Fanning fried  Subjective:  Patient ID: Richard Davenport, male , DOB: [birth year] , age 62 y.o. , MRN: [id number] , ADDRESS: [address] [facility] [place] [address] PCP Pcp, No Patient Care Team: Pcp, No as PCP - General  This Provider for this visit: Treatment Team:  Attending Provider: Kalman Shan, MD    03/26/2022 -   Chief Complaint  Patient presents with   Consult    ILD     HPI Richard Davenport 62 y.o. -I am meeting him for the first time.  His daughter Richard Davenport is with him and she is the main independent historian.  He is giving some of the history.  He is originally from [place].  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  He initially saw Dr. Val Eagle in our office and was diagnosed with ILD and subsequently referred to the [facility] ILD center.  He that he saw Dr. Raynelle Fanning fried.  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  He has been started on low-dose Esbriet protocol and also CellCept.  He started pirfenidone/Esbriet in January 2024 and CellCept around mid February 2024.  Richard Davenport is tolerating these medicines well.  He has been attending pulm rehabilitation but he is desaturating the significantly and the need portable oxygen.  The daughter also states that extremely fatigued after rehab despite oxygen use at rehab.  She wants to pause rehabilitation for the moment.  As part of his ILD workup incidental diagnose of cirrhosis been made in the CT scan.  His mother had NASH.  He does not have any jaundice apparently hepatitis panel is negative.  He has liver clinic appointment at [facility].  He has not done the ILD questionnaire with [place].  His current symptom score is as below  They are going to see [facility] only every 6 months they want more frequent follow-up.  I offered him every 6 months with [place] such that he is  seeing 1 of [place] every 3 months.  He and his daughter are happy with that plan.  He is getting his labs safety monitoring supervised by Heber Sanford daughter wants me to place an order to get it done here every 2 weeks and Dr. Raynelle Fanning freed will then monitor his labs.  He has been considered too early for lung transplantation.     Lab test - At [facility] 01/24/2022 anti-PL-total antibody weakly positive   CT Chest data - HRCT 01/25/22  Narrative & Impression  CLINICAL DATA:  Idiopathic pulmonary fibrosis   EXAM: CT CHEST WITHOUT CONTRAST   TECHNIQUE: Multidetector CT imaging of the chest was performed following the standard protocol without intravenous contrast. High resolution imaging of the lungs, as well as inspiratory and expiratory imaging, was performed.   RADIATION DOSE REDUCTION: This exam was performed according to the departmental dose-optimization program which includes automated exposure control, adjustment of the mA and/or kV according to patient size and/or use of iterative reconstruction technique.   COMPARISON:  CT abdomen pelvis, 07/09/2015   FINDINGS: Cardiovascular: No significant vascular findings. Normal heart size. No pericardial effusion.   Mediastinum/Nodes: Prominent subcentimeter mediastinal and hilar lymph nodes. Thyroid gland, trachea, and esophagus demonstrate no significant findings.   Lungs/Pleura: Moderate pulmonary fibrosis in a pattern with apical to basal gradient, featuring irregular peripheral  interstitial opacity, septal thickening, traction bronchiectasis, subpleural bronchiolectasis, and areas of honeycombing at the lung bases. Fibrotic findings are markedly worsened in comparison to prior imaging of the abdomen and pelvis dated 07/09/2015. No significant air trapping on expiratory phase imaging. No pleural effusion or pneumothorax.   Upper Abdomen: Coarse, nodular contour of the liver. Partially imaged splenomegaly. Small  volume perihepatic and perisplenic ascites.   Musculoskeletal: No chest wall abnormality. No acute osseous findings.   IMPRESSION: 1. Moderate pulmonary fibrosis in a pattern with apical to basal gradient, featuring irregular peripheral interstitial opacity, septal thickening, traction bronchiectasis, subpleural bronchiolectasis, and areas of honeycombing at the lung bases. Fibrotic findings are markedly worsened in comparison to prior imaging of the abdomen and pelvis dated 07/09/2015. Findings are consistent with UIP per consensus guidelines: Diagnosis of Idiopathic Pulmonary Fibrosis: An Official ATS/ERS/JRS/ALAT Clinical Practice Guideline. Am Rosezetta Schlatter Crit Care Med Vol 198, Iss 5, [phone], Sep 21 2016. 2. Prominent subcentimeter mediastinal and hilar lymph nodes, likely reactive to fibrosis. 3. Cirrhosis, partially imaged splenomegaly, and ascites in the included upper abdomen.     Electronically Signed   By: Jearld Lesch M.D.   On: 01/28/2022 13:06     No results found.  ECHO 01/24/22  IMPRESSIONS     1. Left ventricular ejection fraction, by estimation, is 60 to 65%. Left  ventricular ejection fraction by 3D volume is 60 %. The left ventricle has  normal function. The left ventricle has no regional wall motion  abnormalities. Left ventricular diastolic   parameters were normal. The average left ventricular global longitudinal  strain is -25.9 %. The global longitudinal strain is normal.   2. Right ventricular systolic function is normal. The right ventricular  size is normal. There is normal pulmonary artery systolic pressure. The  estimated right ventricular systolic pressure is 24.2 mmHg.   3. The mitral valve is normal in structure. Trivial mitral valve  regurgitation. No evidence of mitral stenosis.   4. The aortic valve is tricuspid. Aortic valve regurgitation is not  visualized. Aortic valve sclerosis/calcification is present, without any  evidence of  aortic stenosis.   5. There is mild dilatation of the ascending aorta, measuring 39 mm.   6. The inferior vena cava is normal in size with greater than 50%  respiratory variability, suggesting right atrial pressure of 3 mmHg.    05/09/2022 Patient presents today for 6-8 week follow-up. He has interstitial lung disease and is followed by Dr. Marchelle Gearing. He is on Esbriet and CellCept. Established with Dr. Josph Macho with [facility] and Dr. Mauri Reading with [facility] for cirrhosis work up.  He is accompanied by his daughter.  Shortness of breath has been worse over the last month and a half with activity.  Feels this is related to fluid containing in abdomen and legs.  He saw a GI specialist at [facility] and was started on furosemide and spironolactone.  Esbriet currently on hold per Dr. Foy Guadalajara due to elevated LFTs.  He recently was prescribed prednisone taper which has helped with fatigue.  He has a chronic dry cough that is mainly present in the morning. He is wanting to return to pulmonary rehab. Seeing Dr. Foy Guadalajara May 2-3 and will re-assess antifibrotics /along with transplant consideration.   IgE 1,589 (2,027); eosinophils 300 Allergies to [place] grass, [place], roach, dog dander, dust mites Patient will be seen Dr. Lucie Leather with allergy and asthma on May 21       OV 07/01/2022  Subjective:  Patient ID: Richard Davenport, male , DOB: Aug 08, 1960 , age 33 y.o. , MRN: [id number] , ADDRESS: [address] [facility] [place] [address] PCP No primary care provider on file. No care team member to display  This Provider for this visit: Treatment Team:  Attending Provider: Kalman Shan, MD    07/01/2022 -   Chief Complaint  Patient presents with   Follow-up    F/up on IPF   #Interstitial lung disease progressive phenotype secondary to autoimmune antibodies  -Primary ILD provider is Dr. Josph Macho at [facility]  -Support of ILD providers Dr. Marchelle Gearing at [facility]  health  -Considered for lung transplant candidate 05/24/2022 by Dr. Molli Hazard pipeling at [facility]  -Last visit at [facility] May 2024: CellCept stopped Esbriet restarted and recommended Solu-Medrol and Rituxan.  -Telomere testing sent from [facility] to at [facility]. #Cirrhosis of the liver  -Care under [facility] initial consult 05/06/2022  -MELD 3.0 score is 17  -Start her on spironolactone and Lasix and low-salt diet mid April 2024  -Noticed to have moderate ascites on ultrasound June 2024  -Normal alpha-1 antitrypsin  # 6 hypoxemia.  HPI Richard Davenport 62 y.o. -returns for follow-up.  Daughter Richard Davenport is not here.  Daughter Richard Davenport is here today.  Daughter is an independent historian.  She does tell that overall he is stable.  Patient also affirms the same.  Daughter admitted that patient is no longer on CellCept.  Between the 2 of them and the chart that I learned that he has now gotten Rituxan at our office but ordered through [facility].  Review of the medical records indicate that mid May 2024 Dr. Foy Guadalajara took him off CellCept and put him on Rituxan with high-dose steroids.  She got this all within the last few weeks.  His dyspnea itself is stable.  He uses oxygen with exertion.  He is now quit working.  His symptom scores are below and he is stable.  Other issues - Cirrhosis details captured above on chart review -Transplant evaluation captured above in chart review   Liver function test  -His bilirubin is normal.  AST 64 and ALT 65.  He continues to be on pirfenidone.  Most recent is 06/27/2022  Past  - elevated IgE/Eos with seasonal allergies: saw Dr Lucie Leather in May 2024. Alpha 1 and Short telomoeres ordered     SYMPTOM SCALE - ILD 03/26/2022 05/09/2022  07/01/2022   Current weight   178lbs   O2 use ra RA ra  Shortness of Breath 0 -> 5 scale with 5 being worst (score 6 If unable to do)    At rest 1.5 0 1  Simple tasks - showers, clothes change,  eating, shaving 3 5 3   Household (dishes, doing bed, laundry) 4 5 4   Shopping 3 3 4   Walking level at own pace 3 3 3   Walking up Stairs 4 5 5   Total (30-36) Dyspnea Score 18.5 21 20   How bad is your cough? 3 2 3   How bad is your fatigue Tires esp after rehab 3 3  How bad is nausea 0 0 1  How bad is vomiting?   0 0 0  How bad is diarrhea? 0 0 0  How bad is anxiety? x 0 1  How bad is depression x 0 1  Any chronic pain - if so where and how bad x 0 x      Simple office walk 185 feet x  3 laps  goal with forehead probe 03/26/2022    05/09/2022    O2 used ra  RA   Number laps completed 1-2 laps out of 3  3 laps    Comments about pace x  x   Resting Pulse Ox/HR 98% and 86/min  92% and 90/min   Final Pulse Ox/HR 86% and 104/min  87% and 96/min   Desaturated </= 88% yes  yes   Desaturated <= 3% points yes  yes   Got Tachycardic >/= 90/min 12 pints  yes    Symptoms at end of test x  x   Miscellaneous comments Needed 3L to correct   Needs 3-4L to correct       PFT     Latest Ref Rng & Units 01/04/2022    9:31 AM  PFT Results  FVC-Pre L 2.96   FVC-Predicted Pre % 65   FVC-Post L 2.95   FVC-Predicted Post % 64   Pre FEV1/FVC % % 83   Post FEV1/FCV % % 86   FEV1-Pre L 2.47   FEV1-Predicted Pre % 71   FEV1-Post L 2.54   DLCO uncorrected ml/min/mmHg 10.60   DLCO UNC% % 39   DLCO corrected ml/min/mmHg 10.60   DLCO COR %Predicted % 39   DLVA Predicted % 64        has a past medical history of Asthma, Diabetes mellitus without complication (HCC), Headache, Idiopathic pulmonary fibrosis (HCC), Stroke (HCC), and Subarachnoid hemorrhage (HCC).   reports that he has never smoked. He has been exposed to tobacco smoke. He has never used smokeless tobacco.  Past Surgical History:  Procedure Laterality Date   ANEURYSM COILING     for bleed   LOOP RECORDER INSERTION N/A 04/10/2018   Procedure: LOOP RECORDER INSERTION;  Surgeon: Duke Salvia, MD;  Location: [facility] INVASIVE CV LAB;   Service: Cardiovascular;  Laterality: N/A;   RADIOLOGY WITH ANESTHESIA N/A 10/09/2012   Procedure: RADIOLOGY WITH ANESTHESIA;  Surgeon: Lisbeth Renshaw, MD;  Location: [facility] OR;  Service: Radiology;  Laterality: N/A;   RADIOLOGY WITH ANESTHESIA N/A 05/27/2013   Procedure: RADIOLOGY WITH ANESTHESIA;  Surgeon: Lisbeth Renshaw, MD;  Location: [facility] OR;  Service: Radiology;  Laterality: N/A;    Allergies  Allergen Reactions   Pork-Derived Products Other (See Comments)    Patient is Muslim and PREFERS TO NOT TAKE ANY PORK OR MEAT PRODUCTS (only fish)    Immunization History  Administered Date(s) Administered   Influenza Whole 11/05/2021   Influenza,inj,Quad PF,6+ Mos 10/13/2012   PNEUMOCOCCAL CONJUGATE-20 01/24/2022   Tdap 05/25/2020    Family History  Problem Relation Age of Onset   Hypertension Mother    Hypertension Father      Current Outpatient Medications:    AIRSUPRA 90-80 MCG/ACT AERO, Inhale 2 puffs into the lungs every 4 (four) hours as needed., Disp: 10.7 g, Rfl: 1   albuterol (VENTOLIN HFA) 108 (90 Base) MCG/ACT inhaler, Inhale 2 puffs into the lungs every 6 (six) hours as needed for wheezing or shortness of breath., Disp: 8 g, Rfl: 6   furosemide (LASIX) 40 MG tablet, Take 0.5 tablets (20 mg total) by mouth daily., Disp: 30 tablet, Rfl: 0   metFORMIN (GLUCOPHAGE) 500 MG tablet, Take 1 tablet (500 mg total) by mouth 2 (two) times daily. (Patient taking differently: Take 500 mg by mouth daily.), Disp: 60 tablet, Rfl: 1   Pirfenidone 267 MG TABS, Take 3 tablets (801 mg total) by mouth with breakfast, with lunch, and with evening  meal. Month 2 and onwards, Disp: 270 tablet, Rfl: 5   predniSONE (DELTASONE) 10 MG tablet, Take 10-30 mg by mouth See admin instructions. Take 3 tablets (30 mg total) by mouth once daily for 14 days, THEN 2 tablets (20 mg total) once daily for 30 days, THEN 1 tablet (10 mg total) once daily for 30 days., Disp: , Rfl:    riTUXimab (RITUXAN IV), Inject into  the vein., Disp: , Rfl:    spironolactone (ALDACTONE) 50 MG tablet, Take 50 mg by mouth daily. (Patient not taking: Reported on 06/11/2022), Disp: , Rfl:       Objective:   Vitals:   07/01/22 1150  BP: 130/80  Pulse: 99  SpO2: 90%  Weight: 164 lb (74.4 kg)  Height: 5\' 8"  (1.727 m)    Estimated body mass index is 24.94 kg/m as calculated from the following:   Height as of this encounter: 5\' 8"  (1.727 m).   Weight as of this encounter: 164 lb (74.4 kg).  @WEIGHTCHANGE @  Filed Weights   07/01/22 1150  Weight: 164 lb (74.4 kg)     Physical Exam   General: No distress. frail O2 at rest: no Cane present: no Sitting in wheel chair: no Frail: yes mild Obese: no Neuro: Alert and Oriented x 3. GCS 15. Speech normal Psych: Pleasant Resp:  Barrel Chest - no.  Wheeze - no, Crackles - YES, No overt respiratory distress CVS: Normal heart sounds. Murmurs - no ABD AScites + Ext: Stigmata of Connective Tissue Disease - no but has CLUBBING HEENT: Normal upper airway. PEERL +. No post nasal drip        Assessment:       ICD-10-CM   1. ILD (interstitial lung disease) (HCC)  J84.9     2. Chronic respiratory failure with hypoxia (HCC)  J96.11     3. Encounter for therapeutic drug monitoring  Z51.81     4. High risk medication use  Z79.899     5. History of wheezing  Z87.898     6. Elevated IgE level  R76.8     7. Eosinophils increased  R89.8     8. Other fatigue  R53.83          Plan:     Patient Instructions     ICD-10-CM   1. ILD (interstitial lung disease) (HCC)  J84.9     2. Other fatigue  R53.83     3. Encounter for therapeutic drug monitoring  Z51.81     4. High risk medication use  Z79.899     5. Encounter for monitoring immunosuppressive medication therapy causing immunodeficiency (HCC)  D84.821    Z51.81    Z79.60      ILD (interstitial lung disease) (HCC) Encounter for therapeutic drug monitoring High risk medication use Encounter for  monitoring immunosuppressive medication therapy causing immunodeficiency (HCC)  - glad you saw Dr Josph Macho at [facility] May 24, 2022 and plan to see her again in August 2024.  -Currently off CellCept - Recent treatment with Rituxan per Dr. Annitta Needs but given at [facility] health - Noted back on pirfenidone and tolerating it well at full dose. -Liver enzymes mildly and persistently elevated in June 2024   Plan -Recheck liver function test in 1 month -CMA to remove CellCept from the [facility] - Continue Esbriet  according to directions from [facility] - CMA will do standing order for CBC, liver function test and chemistry every 4 weeks for the next 3 months  -  You can come to our lab and get it done - for now [facility]  - can consider clinical trials in the future -Await results of telomere testing done at [facility] through [facility]  Coughing and wheezing with elevated IgE  -Currently under remission  Plan - Keep follow-up with Dr. Lucie Leather   Abnormal liver CT and cirrhosis with ascites and MELD score  - -Noted [facility] said you not a candidate for lung transplantation in May/June 2024  Plan  - Keep GI visit at [facility]  Followup -   -October 2024 DR Marchelle Gearing - 30 min visit [will change follow-up to 6-8 weeks; decision made after he left]  - symptom score and simple wak test  -Administer ILD question at next visit      ( Level 05 visit E&M 2024: Estb >= 40 min in  visit type: on-site physical face to visit  in total care time and counseling or/and coordination of care by this undersigned MD - Dr Kalman Shan. This includes one or more of the following on this same day 07/01/2022: pre-charting, chart review, note writing, documentation discussion of test results, diagnostic or treatment recommendations, prognosis, risks and benefits of management options, instructions, education, compliance or risk-factor reduction. It excludes time spent by the CMA  or office staff in the care of the patient. Actual time 42 min)   SIGNATURE    Dr. Kalman Shan, M.D., F.C.C.P,  Pulmonary and Critical Care Medicine Staff Physician, [facility] Director - Interstitial Lung Disease  Program  Pulmonary Fibrosis [facility] at [facility], [place], [address]  Pager: [phone], If no answer or between  15:00h - 7:00h: call [phone] Telephone: [phone]  12:09 PM 07/01/2022

## 2022-07-01 NOTE — Telephone Encounter (Signed)
When I spoke with the patients daughter last month she was going to see if he could have it drawn at Pulmonology office during his infusion, she stated that if not then she would bring him  by. I called and spoke with her just now and she stated that he must have forgotten to come by and have it drawn. She is going to call and remind him and states that she will make sure he come in this week to get the lab drawn at our office.

## 2022-07-08 ENCOUNTER — Telehealth: Payer: Self-pay

## 2022-07-08 NOTE — Telephone Encounter (Signed)
Denise V./[facility]- [phone] - DOB verified-  stated the Fish Oncology Test: Please be sure to target for Short Telomere Syndrome  - can not be done due to [facility] does not have the vials to do the testing.   Angelique Blonder stated the provider/patient could check with [facility] regarding having the test done with them - she did not know whether the test would be covered by insurance or not.  Forwarding message to provider as update.

## 2022-07-17 NOTE — Progress Notes (Signed)
[address] [place] [address] Dept: [phone]  FOLLOW UP NOTE  Patient ID: Richard Davenport, male    DOB: Jul 06, 1960  Age: 62 y.o. MRN: [id number] Date of Office Visit: 07/18/2022  Assessment  Chief Complaint: Allergy Testing and Follow-up  HPI Richard Davenport is a 62 year old male who presents to the clinic for environmental allergy skin testing. He was last seen in this clinic on 06/11/2022 by Dr. Lucie Leather for evaluation of Shortness of breath requiring oxygen during activity and overnight, allergic rhinitis, and reflux.  He has recently been diagnosed with an interstitial lung disease and was started on low-dose Esbriet protocol and CellCept.  He continues to follow-up with Dr. Marchelle Gearing, pulmonary disease specialist as well as [facility].  He returns to the clinic for environmental allergy skin testing.  At today's visit, he reports he is feeling well overall with no cardiopulmonary, gastrointestinal, or integumentary symptoms.  He has not had any antihistamines over the last 3 days.  He does use oxygen for activity and nighttime.  His oxygen saturations without oxygen at the clinic today is in the low 80s.  He reports that he occasionall desaturates during activity and reports the walk from the car to the clinic has made his oxygen drop.  We did place him on 4 L oxygen via nasal cannula and his oxygen saturation increased to 95%.   Drug Allergies:  Allergies  Allergen Reactions   Pork-Derived Products Other (See Comments)    Patient is Muslim and PREFERS TO NOT TAKE ANY PORK OR MEAT PRODUCTS (only fish)    Physical Exam: BP (!) 130/90 (BP Location: Left Arm, Patient Position: Sitting, Cuff Size: Normal)   Pulse 94   Temp 97.9 F (36.6 C) (Temporal)   Resp 20   SpO2 95%    Physical Exam Vitals reviewed.  Constitutional:      Appearance: Normal appearance.  HENT:     Head: Normocephalic and atraumatic.     Right Ear: Tympanic membrane normal.     Left Ear: Tympanic  membrane normal.     Nose:     Comments: Lateral nares normal.  Pharynx normal.  Ears normal.  Eyes normal.    Mouth/Throat:     Pharynx: Oropharynx is clear.  Eyes:     Conjunctiva/sclera: Conjunctivae normal.  Cardiovascular:     Rate and Rhythm: Normal rate and regular rhythm.     Heart sounds: Normal heart sounds. No murmur heard. Pulmonary:     Effort: Pulmonary effort is normal.     Breath sounds: Normal breath sounds.     Comments: Lungs with bilateral fine crackles in the bases.  No wheezing.  No rhonchi Musculoskeletal:        General: Normal range of motion.     Cervical back: Normal range of motion and neck supple.  Skin:    General: Skin is warm and dry.  Neurological:     Mental Status: He is alert and oriented to person, place, and time.  Psychiatric:        Mood and Affect: Mood normal.        Behavior: Behavior normal.        Thought Content: Thought content normal.        Judgment: Judgment normal.     Diagnostics: FVC 1.73 which is 46% of predicted value, FEV1 1.20 which is 38% of predicted value.  Percutaneous environmental allergy skin testing was negative with adequate controls  Intradermal environmental allergy skin  testing was negative with an adequate control  Assessment and Plan: 1. Chronic rhinitis     Patient Instructions   1. Your environmental allergy skin testing was negative today  2. Continue oxygen during exertion and while asleep  3. Use AirSupra - 2 inhalations every 4-6 hours if needed w/ spacer (empty lungs)  4. AirSupra takes the place of albuterol  5. Consisently use omeprazole 40 mg - 1 tablet 1 time per day  6. FISH for short telomere is still in process  7.  Follow-up with Dr. Lucie Leather in 3 months  Return in about 3 months (around 10/18/2022), or if symptoms worsen or fail to improve.    Thank you for the opportunity to care for this patient.  Please do not hesitate to contact me with questions.  Thermon Leyland,  FNP [facility]

## 2022-07-17 NOTE — Patient Instructions (Incomplete)
  1. Your environmental allergy skin testing was negative today  2. Continue oxygen during exertion and while asleep  3. Use AirSupra - 2 inhalations every 4-6 hours if needed w/ spacer (empty lungs)  4. AirSupra takes the place of albuterol  5. Consisently use omeprazole 40 mg - 1 tablet 1 time per day  6. FISH for short telomere is still in process  7.  Follow-up with Dr. Lucie Leather in 3 months

## 2022-07-18 ENCOUNTER — Other Ambulatory Visit: Payer: Self-pay

## 2022-07-18 ENCOUNTER — Ambulatory Visit (INDEPENDENT_AMBULATORY_CARE_PROVIDER_SITE_OTHER): Payer: Commercial Managed Care - PPO | Admitting: Family Medicine

## 2022-07-18 ENCOUNTER — Encounter: Payer: Self-pay | Admitting: Family Medicine

## 2022-07-18 VITALS — BP 130/90 | HR 94 | Temp 97.9°F | Resp 20

## 2022-07-18 DIAGNOSIS — J310 Chronic rhinitis: Secondary | ICD-10-CM | POA: Diagnosis not present

## 2022-07-19 ENCOUNTER — Encounter: Payer: Self-pay | Admitting: Family Medicine

## 2022-09-11 ENCOUNTER — Encounter: Payer: Self-pay | Admitting: Pulmonary Disease

## 2022-09-13 NOTE — Telephone Encounter (Signed)
Updated information:  Richard Davenport contacted [facility] regarding below notation - she came and got me - we spoke to [facility] Oncology Dept. - reiterated what she had stated on 07/08/22 when she contacted out office.   Richard Davenport then placed [place] on hold to review with her Supervisor - stated, they believe Oncology Dept does not offer the test [phone] - we needed to speak to someone in [employer].  Richard Davenport transferred [place] to their [employer] - spoke to [place] - DOB verified - advised of above notation.   We were disconnected  - Richard Davenport will contact [facility] on Monday to follow up.

## 2022-09-13 NOTE — Telephone Encounter (Signed)
Per provider:  Please have rochelle look into this test. Apparently [facility] send to [facility] other centers.    Reviewed lab test with Boykin Reaper - she will research then follow back up with me.

## 2022-09-19 ENCOUNTER — Other Ambulatory Visit: Payer: Medicaid Other

## 2022-09-19 DIAGNOSIS — J849 Interstitial pulmonary disease, unspecified: Secondary | ICD-10-CM

## 2022-09-20 LAB — CBC WITH DIFFERENTIAL/PLATELET
Basophils Absolute: 0.1 10*3/uL (ref 0.0–0.1)
Basophils Relative: 0.7 % (ref 0.0–3.0)
Eosinophils Absolute: 0.7 10*3/uL (ref 0.0–0.7)
Eosinophils Relative: 9.1 % — ABNORMAL HIGH (ref 0.0–5.0)
HCT: 37.9 % — ABNORMAL LOW (ref 39.0–52.0)
Hemoglobin: 12 g/dL — ABNORMAL LOW (ref 13.0–17.0)
Lymphocytes Relative: 13.1 % (ref 12.0–46.0)
Lymphs Abs: 1 10*3/uL (ref 0.7–4.0)
MCHC: 31.6 g/dL (ref 30.0–36.0)
MCV: 108.7 fl — ABNORMAL HIGH (ref 78.0–100.0)
Monocytes Absolute: 0.8 10*3/uL (ref 0.1–1.0)
Monocytes Relative: 9.8 % (ref 3.0–12.0)
Neutro Abs: 5.2 10*3/uL (ref 1.4–7.7)
Neutrophils Relative %: 67.3 % (ref 43.0–77.0)
Platelets: 149 10*3/uL — ABNORMAL LOW (ref 150.0–400.0)
RBC: 3.48 Mil/uL — ABNORMAL LOW (ref 4.22–5.81)
RDW: 17.1 % — ABNORMAL HIGH (ref 11.5–15.5)
WBC: 7.8 10*3/uL (ref 4.0–10.5)

## 2022-09-20 LAB — BASIC METABOLIC PANEL
BUN: 24 mg/dL — ABNORMAL HIGH (ref 6–23)
CO2: 24 meq/L (ref 19–32)
Calcium: 8.5 mg/dL (ref 8.4–10.5)
Chloride: 104 mEq/L (ref 96–112)
Creatinine, Ser: 0.95 mg/dL (ref 0.40–1.50)
GFR: 87.23 mL/min (ref >60.00)
Glucose, Bld: 176 mg/dL — ABNORMAL HIGH (ref 70–99)
Potassium: 3.9 meq/L (ref 3.5–5.1)
Sodium: 136 meq/L (ref 135–145)

## 2022-09-20 LAB — HEPATIC FUNCTION PANEL
ALT: 37 U/L (ref 0–53)
AST: 57 U/L — ABNORMAL HIGH (ref 0–37)
Albumin: 2.2 g/dL — ABNORMAL LOW (ref 3.5–5.2)
Alkaline Phosphatase: 264 U/L — ABNORMAL HIGH (ref 39–117)
Bilirubin, Direct: 0.5 mg/dL — ABNORMAL HIGH (ref 0.0–0.3)
Total Bilirubin: 1.3 mg/dL — ABNORMAL HIGH (ref 0.2–1.2)
Total Protein: 8.5 g/dL — ABNORMAL HIGH (ref 6.0–8.3)

## 2022-09-26 NOTE — Telephone Encounter (Signed)
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

## 2022-10-07 ENCOUNTER — Encounter: Payer: Self-pay | Admitting: Pulmonary Disease

## 2022-10-14 ENCOUNTER — Telehealth (HOSPITAL_COMMUNITY): Payer: Self-pay

## 2022-10-14 NOTE — Telephone Encounter (Signed)
Pt daughter Geanie Logan called and stated pt is now interested in PR. Will pass to RN for 2nd review and clearance.

## 2022-10-14 NOTE — Telephone Encounter (Signed)
Called and spoke with pt daughter Geanie Logan and adv her per Denny Peon w/ [employer] stated pt insurance does not cover PR. She verbalized understanding.   Closed referral

## 2022-10-14 NOTE — Telephone Encounter (Signed)
Pt insurance is active and benefits verified through [facility]. Per Denny Peon with [facility], pt plan does not cover G-Codes. ZOX#[id number], 10-23-22

## 2022-10-18 ENCOUNTER — Ambulatory Visit: Payer: Medicaid Other | Admitting: Nurse Practitioner

## 2022-10-22 ENCOUNTER — Ambulatory Visit: Payer: Medicaid Other | Admitting: Allergy and Immunology

## 2022-11-01 ENCOUNTER — Encounter: Payer: Self-pay | Admitting: Internal Medicine

## 2022-11-01 ENCOUNTER — Ambulatory Visit: Payer: Medicaid Other | Admitting: Internal Medicine

## 2022-11-01 VITALS — BP 107/72 | HR 97 | Ht 68.0 in | Wt 152.2 lb

## 2022-11-01 DIAGNOSIS — J84112 Idiopathic pulmonary fibrosis: Secondary | ICD-10-CM | POA: Diagnosis not present

## 2022-11-01 NOTE — Progress Notes (Signed)
OV 03/26/2022 -transferred to the ILD center with Dr. Marchelle Gearing by Dr. Val Eagle.  Patient will "be comanaged with [facility] Dr. Raynelle Fanning fried  Subjective:  Patient ID: Imogene Burn, male , DOB: 20-Jul-1960 , age 62 y.o. , MRN: [id number] , ADDRESS: [address] [place] [facility] [place] [address] PCP Pcp, No Patient Care Team: Pcp, No as PCP - General  This Provider for this visit: Treatment Team:  Attending Provider: Kalman Shan, MD    03/26/2022 -   Chief Complaint  Patient presents with   Consult    ILD     HPI Elway Mccullum 62 y.o. -I am meeting him for the first time.  His daughter Kirt Boys is with him and she is the main independent historian.  He is giving some of the history.  He is originally from [place].  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  He initially saw Dr. Val Eagle in our office and was diagnosed with ILD and subsequently referred to the [facility] ILD center.  He that he saw Dr. Raynelle Fanning fried.  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  He has been started on low-dose Esbriet protocol and also CellCept.  He started pirfenidone/Esbriet in January 2024 and CellCept around mid February 2024.  Sophia is tolerating these medicines well.  He has been attending pulm rehabilitation but he is desaturating the significantly and the need portable oxygen.  The daughter also states that extremely fatigued after rehab despite oxygen use at rehab.  She wants to pause rehabilitation for the moment.  As part of his ILD workup incidental diagnose of cirrhosis been made in the CT scan.  His mother had NASH.  He does not have any jaundice apparently hepatitis panel is negative.  He has liver clinic appointment at [facility].  He has not done the ILD questionnaire with [place].  His current symptom score is as below  They are going to see [facility] only every 6 months they want more frequent follow-up.  I offered him every 6 months with [place] such that he is  seeing 1 of [place] every 3 months.  He and his daughter are happy with that plan.  He is getting his labs safety monitoring supervised by Heber Harmon daughter wants me to place an order to get it done here every 2 weeks and Dr. Raynelle Fanning freed will then monitor his labs.  He has been considered too early for lung transplantation.     Lab test - At [facility] 01/24/2022 anti-PL-total antibody weakly positive   CT Chest data - HRCT 01/25/22  Narrative & Impression  CLINICAL DATA:  Idiopathic pulmonary fibrosis   EXAM: CT CHEST WITHOUT CONTRAST   TECHNIQUE: Multidetector CT imaging of the chest was performed following the standard protocol without intravenous contrast. High resolution imaging of the lungs, as well as inspiratory and expiratory imaging, was performed.   RADIATION DOSE REDUCTION: This exam was performed according to the departmental dose-optimization program which includes automated exposure control, adjustment of the mA and/or kV according to patient size and/or use of iterative reconstruction technique.   COMPARISON:  CT abdomen pelvis, 07/09/2015   FINDINGS: Cardiovascular: No significant vascular findings. Normal heart size. No pericardial effusion.   Mediastinum/Nodes: Prominent subcentimeter mediastinal and hilar lymph nodes. Thyroid gland, trachea, and esophagus demonstrate no significant findings.   Lungs/Pleura: Moderate pulmonary fibrosis in a pattern with apical to basal gradient, featuring irregular peripheral  OV 03/26/2022 -transferred to the ILD center with Dr. Marchelle Gearing by Dr. Val Eagle.  Patient will "be comanaged with [facility] Dr. Raynelle Fanning fried  Subjective:  Patient ID: Imogene Burn, male , DOB: 20-Jul-1960 , age 62 y.o. , MRN: [id number] , ADDRESS: [address] [place] [facility] [place] [address] PCP Pcp, No Patient Care Team: Pcp, No as PCP - General  This Provider for this visit: Treatment Team:  Attending Provider: Kalman Shan, MD    03/26/2022 -   Chief Complaint  Patient presents with   Consult    ILD     HPI Elway Mccullum 62 y.o. -I am meeting him for the first time.  His daughter Kirt Boys is with him and she is the main independent historian.  He is giving some of the history.  He is originally from [place].  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  He initially saw Dr. Val Eagle in our office and was diagnosed with ILD and subsequently referred to the [facility] ILD center.  He that he saw Dr. Raynelle Fanning fried.  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  He has been started on low-dose Esbriet protocol and also CellCept.  He started pirfenidone/Esbriet in January 2024 and CellCept around mid February 2024.  Sophia is tolerating these medicines well.  He has been attending pulm rehabilitation but he is desaturating the significantly and the need portable oxygen.  The daughter also states that extremely fatigued after rehab despite oxygen use at rehab.  She wants to pause rehabilitation for the moment.  As part of his ILD workup incidental diagnose of cirrhosis been made in the CT scan.  His mother had NASH.  He does not have any jaundice apparently hepatitis panel is negative.  He has liver clinic appointment at [facility].  He has not done the ILD questionnaire with [place].  His current symptom score is as below  They are going to see [facility] only every 6 months they want more frequent follow-up.  I offered him every 6 months with [place] such that he is  seeing 1 of [place] every 3 months.  He and his daughter are happy with that plan.  He is getting his labs safety monitoring supervised by Heber Harmon daughter wants me to place an order to get it done here every 2 weeks and Dr. Raynelle Fanning freed will then monitor his labs.  He has been considered too early for lung transplantation.     Lab test - At [facility] 01/24/2022 anti-PL-total antibody weakly positive   CT Chest data - HRCT 01/25/22  Narrative & Impression  CLINICAL DATA:  Idiopathic pulmonary fibrosis   EXAM: CT CHEST WITHOUT CONTRAST   TECHNIQUE: Multidetector CT imaging of the chest was performed following the standard protocol without intravenous contrast. High resolution imaging of the lungs, as well as inspiratory and expiratory imaging, was performed.   RADIATION DOSE REDUCTION: This exam was performed according to the departmental dose-optimization program which includes automated exposure control, adjustment of the mA and/or kV according to patient size and/or use of iterative reconstruction technique.   COMPARISON:  CT abdomen pelvis, 07/09/2015   FINDINGS: Cardiovascular: No significant vascular findings. Normal heart size. No pericardial effusion.   Mediastinum/Nodes: Prominent subcentimeter mediastinal and hilar lymph nodes. Thyroid gland, trachea, and esophagus demonstrate no significant findings.   Lungs/Pleura: Moderate pulmonary fibrosis in a pattern with apical to basal gradient, featuring irregular peripheral  OV 03/26/2022 -transferred to the ILD center with Dr. Marchelle Gearing by Dr. Val Eagle.  Patient will "be comanaged with [facility] Dr. Raynelle Fanning fried  Subjective:  Patient ID: Imogene Burn, male , DOB: 20-Jul-1960 , age 62 y.o. , MRN: [id number] , ADDRESS: [address] [place] [facility] [facility] [place] [address] PCP Pcp, No Patient Care Team: Pcp, No as PCP - General  This Provider for this visit: Treatment Team:  Attending Provider: Kalman Shan, MD    03/26/2022 -   Chief Complaint  Patient presents with   Consult    ILD     HPI Elway Mccullum 62 y.o. -I am meeting him for the first time.  His daughter Kirt Boys is with him and she is the main independent historian.  He is giving some of the history.  He is originally from [place].  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  He initially saw Dr. Val Eagle in our office and was diagnosed with ILD and subsequently referred to the [facility] ILD center.  He that he saw Dr. Raynelle Fanning fried.  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  He has been started on low-dose Esbriet protocol and also CellCept.  He started pirfenidone/Esbriet in January 2024 and CellCept around mid February 2024.  Sophia is tolerating these medicines well.  He has been attending pulm rehabilitation but he is desaturating the significantly and the need portable oxygen.  The daughter also states that extremely fatigued after rehab despite oxygen use at rehab.  She wants to pause rehabilitation for the moment.  As part of his ILD workup incidental diagnose of cirrhosis been made in the CT scan.  His mother had NASH.  He does not have any jaundice apparently hepatitis panel is negative.  He has liver clinic appointment at [facility].  He has not done the ILD questionnaire with [place].  His current symptom score is as below  They are going to see [facility] only every 6 months they want more frequent follow-up.  I offered him every 6 months with [place] such that he is  seeing 1 of [place] every 3 months.  He and his daughter are happy with that plan.  He is getting his labs safety monitoring supervised by Heber Harmon daughter wants me to place an order to get it done here every 2 weeks and Dr. Raynelle Fanning freed will then monitor his labs.  He has been considered too early for lung transplantation.     Lab test - At [facility] 01/24/2022 anti-PL-total antibody weakly positive   CT Chest data - HRCT 01/25/22  Narrative & Impression  CLINICAL DATA:  Idiopathic pulmonary fibrosis   EXAM: CT CHEST WITHOUT CONTRAST   TECHNIQUE: Multidetector CT imaging of the chest was performed following the standard protocol without intravenous contrast. High resolution imaging of the lungs, as well as inspiratory and expiratory imaging, was performed.   RADIATION DOSE REDUCTION: This exam was performed according to the departmental dose-optimization program which includes automated exposure control, adjustment of the mA and/or kV according to patient size and/or use of iterative reconstruction technique.   COMPARISON:  CT abdomen pelvis, 07/09/2015   FINDINGS: Cardiovascular: No significant vascular findings. Normal heart size. No pericardial effusion.   Mediastinum/Nodes: Prominent subcentimeter mediastinal and hilar lymph nodes. Thyroid gland, trachea, and esophagus demonstrate no significant findings.   Lungs/Pleura: Moderate pulmonary fibrosis in a pattern with apical to basal gradient, featuring irregular peripheral  Patient ID: Azeem Sabic, male , DOB: 11-27-1960 , age 61 y.o. , MRN: [id number] , ADDRESS: [address] [facility] [place] [address] PCP No primary care provider on file. 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 , MRN: [id number] , ADDRESS: 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  -Primary ILD provider is Dr. Josph Macho at [facility]  -Support of ILD providers Dr. Marchelle Gearing at [facility] health  -Considered for lung transplant candidate 05/24/2022 by Dr. Molli Hazard pipeling at [facility]  -Last visit at [facility] May 2024: CellCept stopped Esbriet restarted and recommended Solu-Medrol and Rituxan.  -Telomere testing sent from [facility] to at [facility]  #Cirrhosis of the liver  -Care under [facility] initial consult 05/06/2022  - MELD 3.0 score is 17  - April 2024   -MELD sodium score 21 on 05/23/2022 and 08/05/2022.  -Start her on spironolactone and Lasix and low-salt diet mid April 2024  -Noticed to have moderate ascites on ultrasound June 2024  -Normal alpha-1 antitrypsin  # 6 hypoxemia.  HPI Abdihakim Thorsen 62 y.o. -returns for follow-up.  Daughter Kirt Boys is not here.  Daughter Madlyn Frankel is here today.  Daughter is an independent historian.  She does tell that overall he is stable.  Patient also affirms the same.  Daughter admitted that patient is no longer on CellCept.  Between the 2 of them and the chart that I learned that he has now gotten Rituxan at our office but ordered through [facility].  Review of the medical records indicate that mid May 2024 Dr. Foy Guadalajara took him off CellCept and put him on Rituxan with high-dose steroids.  She got this all within the last few weeks.  His dyspnea itself is stable.  He uses oxygen with exertion.  He is now quit working.  His symptom scores are below and he is stable.  Other issues - Cirrhosis  details captured above on chart review -Transplant evaluation captured above in chart review   Liver function test  -His bilirubin is normal.  AST 64 and ALT 65.  He continues to be on pirfenidone.  Most recent is 06/27/2022  Past  - elevated IgE/Eos with seasonal allergies: saw Dr Lucie Leather in May 2024. Alpha 1 and Short telomoeres ordered  11/01/2022 -   Chief Complaint  Patient presents with   Follow-up    Pt is coughing, sob pt states it is worse than his normal. Pt states he is using his inhaler.      HPI Sedric Amundsen 62 y.o. -returns for follow-up.  Presents with his son.  His main complaint today is coughing but him and review of the medical records on 08/28/2022 he saw Raynelle Fanning freed at [facility].  He has been started on Rituxan since May 2024.  He gets 2 doses  OV 03/26/2022 -transferred to the ILD center with Dr. Marchelle Gearing by Dr. Val Eagle.  Patient will "be comanaged with [facility] Dr. Raynelle Fanning fried  Subjective:  Patient ID: Imogene Burn, male , DOB: 20-Jul-1960 , age 62 y.o. , MRN: [id number] , ADDRESS: [address] [place] [facility] [place] [address] PCP Pcp, No Patient Care Team: Pcp, No as PCP - General  This Provider for this visit: Treatment Team:  Attending Provider: Kalman Shan, MD    03/26/2022 -   Chief Complaint  Patient presents with   Consult    ILD     HPI Elway Mccullum 62 y.o. -I am meeting him for the first time.  His daughter Kirt Boys is with him and she is the main independent historian.  He is giving some of the history.  He is originally from [place].  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  He initially saw Dr. Val Eagle in our office and was diagnosed with ILD and subsequently referred to the [facility] ILD center.  He that he saw Dr. Raynelle Fanning fried.  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  He has been started on low-dose Esbriet protocol and also CellCept.  He started pirfenidone/Esbriet in January 2024 and CellCept around mid February 2024.  Sophia is tolerating these medicines well.  He has been attending pulm rehabilitation but he is desaturating the significantly and the need portable oxygen.  The daughter also states that extremely fatigued after rehab despite oxygen use at rehab.  She wants to pause rehabilitation for the moment.  As part of his ILD workup incidental diagnose of cirrhosis been made in the CT scan.  His mother had NASH.  He does not have any jaundice apparently hepatitis panel is negative.  He has liver clinic appointment at [facility].  He has not done the ILD questionnaire with [place].  His current symptom score is as below  They are going to see [facility] only every 6 months they want more frequent follow-up.  I offered him every 6 months with [place] such that he is  seeing 1 of [place] every 3 months.  He and his daughter are happy with that plan.  He is getting his labs safety monitoring supervised by Heber Harmon daughter wants me to place an order to get it done here every 2 weeks and Dr. Raynelle Fanning freed will then monitor his labs.  He has been considered too early for lung transplantation.     Lab test - At [facility] 01/24/2022 anti-PL-total antibody weakly positive   CT Chest data - HRCT 01/25/22  Narrative & Impression  CLINICAL DATA:  Idiopathic pulmonary fibrosis   EXAM: CT CHEST WITHOUT CONTRAST   TECHNIQUE: Multidetector CT imaging of the chest was performed following the standard protocol without intravenous contrast. High resolution imaging of the lungs, as well as inspiratory and expiratory imaging, was performed.   RADIATION DOSE REDUCTION: This exam was performed according to the departmental dose-optimization program which includes automated exposure control, adjustment of the mA and/or kV according to patient size and/or use of iterative reconstruction technique.   COMPARISON:  CT abdomen pelvis, 07/09/2015   FINDINGS: Cardiovascular: No significant vascular findings. Normal heart size. No pericardial effusion.   Mediastinum/Nodes: Prominent subcentimeter mediastinal and hilar lymph nodes. Thyroid gland, trachea, and esophagus demonstrate no significant findings.   Lungs/Pleura: Moderate pulmonary fibrosis in a pattern with apical to basal gradient, featuring irregular peripheral  Patient ID: Azeem Sabic, male , DOB: 11-27-1960 , age 61 y.o. , MRN: [id number] , ADDRESS: [address] [facility] [place] [address] PCP No primary care provider on file. 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 , MRN: [id number] , ADDRESS: 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  -Primary ILD provider is Dr. Josph Macho at [facility]  -Support of ILD providers Dr. Marchelle Gearing at [facility] health  -Considered for lung transplant candidate 05/24/2022 by Dr. Molli Hazard pipeling at [facility]  -Last visit at [facility] May 2024: CellCept stopped Esbriet restarted and recommended Solu-Medrol and Rituxan.  -Telomere testing sent from [facility] to at [facility]  #Cirrhosis of the liver  -Care under [facility] initial consult 05/06/2022  - MELD 3.0 score is 17  - April 2024   -MELD sodium score 21 on 05/23/2022 and 08/05/2022.  -Start her on spironolactone and Lasix and low-salt diet mid April 2024  -Noticed to have moderate ascites on ultrasound June 2024  -Normal alpha-1 antitrypsin  # 6 hypoxemia.  HPI Abdihakim Thorsen 62 y.o. -returns for follow-up.  Daughter Kirt Boys is not here.  Daughter Madlyn Frankel is here today.  Daughter is an independent historian.  She does tell that overall he is stable.  Patient also affirms the same.  Daughter admitted that patient is no longer on CellCept.  Between the 2 of them and the chart that I learned that he has now gotten Rituxan at our office but ordered through [facility].  Review of the medical records indicate that mid May 2024 Dr. Foy Guadalajara took him off CellCept and put him on Rituxan with high-dose steroids.  She got this all within the last few weeks.  His dyspnea itself is stable.  He uses oxygen with exertion.  He is now quit working.  His symptom scores are below and he is stable.  Other issues - Cirrhosis  details captured above on chart review -Transplant evaluation captured above in chart review   Liver function test  -His bilirubin is normal.  AST 64 and ALT 65.  He continues to be on pirfenidone.  Most recent is 06/27/2022  Past  - elevated IgE/Eos with seasonal allergies: saw Dr Lucie Leather in May 2024. Alpha 1 and Short telomoeres ordered  11/01/2022 -   Chief Complaint  Patient presents with   Follow-up    Pt is coughing, sob pt states it is worse than his normal. Pt states he is using his inhaler.      HPI Sedric Amundsen 62 y.o. -returns for follow-up.  Presents with his son.  His main complaint today is coughing but him and review of the medical records on 08/28/2022 he saw Raynelle Fanning freed at [facility].  He has been started on Rituxan since May 2024.  He gets 2 doses  OV 03/26/2022 -transferred to the ILD center with Dr. Marchelle Gearing by Dr. Val Eagle.  Patient will "be comanaged with [facility] Dr. Raynelle Fanning fried  Subjective:  Patient ID: Imogene Burn, male , DOB: 20-Jul-1960 , age 62 y.o. , MRN: [id number] , ADDRESS: [address] [place] [facility] [place] [address] PCP Pcp, No Patient Care Team: Pcp, No as PCP - General  This Provider for this visit: Treatment Team:  Attending Provider: Kalman Shan, MD    03/26/2022 -   Chief Complaint  Patient presents with   Consult    ILD     HPI Elway Mccullum 62 y.o. -I am meeting him for the first time.  His daughter Kirt Boys is with him and she is the main independent historian.  He is giving some of the history.  He is originally from [place].  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  He initially saw Dr. Val Eagle in our office and was diagnosed with ILD and subsequently referred to the [facility] ILD center.  He that he saw Dr. Raynelle Fanning fried.  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  He has been started on low-dose Esbriet protocol and also CellCept.  He started pirfenidone/Esbriet in January 2024 and CellCept around mid February 2024.  Sophia is tolerating these medicines well.  He has been attending pulm rehabilitation but he is desaturating the significantly and the need portable oxygen.  The daughter also states that extremely fatigued after rehab despite oxygen use at rehab.  She wants to pause rehabilitation for the moment.  As part of his ILD workup incidental diagnose of cirrhosis been made in the CT scan.  His mother had NASH.  He does not have any jaundice apparently hepatitis panel is negative.  He has liver clinic appointment at [facility].  He has not done the ILD questionnaire with [place].  His current symptom score is as below  They are going to see [facility] only every 6 months they want more frequent follow-up.  I offered him every 6 months with [place] such that he is  seeing 1 of [place] every 3 months.  He and his daughter are happy with that plan.  He is getting his labs safety monitoring supervised by Heber Harmon daughter wants me to place an order to get it done here every 2 weeks and Dr. Raynelle Fanning freed will then monitor his labs.  He has been considered too early for lung transplantation.     Lab test - At [facility] 01/24/2022 anti-PL-total antibody weakly positive   CT Chest data - HRCT 01/25/22  Narrative & Impression  CLINICAL DATA:  Idiopathic pulmonary fibrosis   EXAM: CT CHEST WITHOUT CONTRAST   TECHNIQUE: Multidetector CT imaging of the chest was performed following the standard protocol without intravenous contrast. High resolution imaging of the lungs, as well as inspiratory and expiratory imaging, was performed.   RADIATION DOSE REDUCTION: This exam was performed according to the departmental dose-optimization program which includes automated exposure control, adjustment of the mA and/or kV according to patient size and/or use of iterative reconstruction technique.   COMPARISON:  CT abdomen pelvis, 07/09/2015   FINDINGS: Cardiovascular: No significant vascular findings. Normal heart size. No pericardial effusion.   Mediastinum/Nodes: Prominent subcentimeter mediastinal and hilar lymph nodes. Thyroid gland, trachea, and esophagus demonstrate no significant findings.   Lungs/Pleura: Moderate pulmonary fibrosis in a pattern with apical to basal gradient, featuring irregular peripheral  Patient ID: Azeem Sabic, male , DOB: 11-27-1960 , age 61 y.o. , MRN: [id number] , ADDRESS: [id number] [facility] [place] [address] PCP No primary care provider on file. 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 , MRN: [id number] , ADDRESS: 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  -Primary ILD provider is Dr. Josph Macho at [facility]  -Support of ILD providers Dr. Marchelle Gearing at [facility] health  -Considered for lung transplant candidate 05/24/2022 by Dr. Molli Hazard pipeling at [facility]  -Last visit at [facility] May 2024: CellCept stopped Esbriet restarted and recommended Solu-Medrol and Rituxan.  -Telomere testing sent from [facility] to at [facility]NDROME  #Cirrhosis of the liver  -Care under [facility] initial consult 05/06/2022  - MELD 3.0 score is 17  - April 2024   -MELD sodium score 21 on 05/23/2022 and 08/05/2022.  -Start her on spironolactone and Lasix and low-salt diet mid April 2024  -Noticed to have moderate ascites on ultrasound June 2024  -Normal alpha-1 antitrypsin  # 6 hypoxemia.  HPI Abdihakim Thorsen 62 y.o. -returns for follow-up.  Daughter Kirt Boys is not here.  Daughter Madlyn Frankel is here today.  Daughter is an independent historian.  She does tell that overall he is stable.  Patient also affirms the same.  Daughter admitted that patient is no longer on CellCept.  Between the 2 of them and the chart that I learned that he has now gotten Rituxan at our office but ordered through [facility].  Review of the medical records indicate that mid May 2024 Dr. Foy Guadalajara took him off CellCept and put him on Rituxan with high-dose steroids.  She got this all within the last few weeks.  His dyspnea itself is stable.  He uses oxygen with exertion.  He is now quit working.  His symptom scores are below and he is stable.  Other issues - Cirrhosis  details captured above on chart review -Transplant evaluation captured above in chart review   Liver function test  -His bilirubin is normal.  AST 64 and ALT 65.  He continues to be on pirfenidone.  Most recent is 06/27/2022  Past  - elevated IgE/Eos with seasonal allergies: saw Dr Lucie Leather in May 2024. Alpha 1 and Short telomoeres ordered  11/01/2022 -   Chief Complaint  Patient presents with   Follow-up    Pt is coughing, sob pt states it is worse than his normal. Pt states he is using his inhaler.      HPI Sedric Amundsen 62 y.o. -returns for follow-up.  Presents with his son.  His main complaint today is coughing but him and review of the medical records on 08/28/2022 he saw Raynelle Fanning freed at [facility].  He has been started on Rituxan since May 2024.  He gets 2 doses

## 2022-11-01 NOTE — Patient Instructions (Addendum)
ICD-10-CM   1. ILD (interstitial lung disease) (HCC)  J84.9     2. Other fatigue  R53.83     3. Encounter for therapeutic drug monitoring  Z51.81     4. High risk medication use  Z79.899     5. Encounter for monitoring immunosuppressive medication therapy causing immunodeficiency (HCC)  D84.821    Z51.81    Z79.60      ILD (interstitial lung disease) (HCC) Encounter for therapeutic drug monitoring High risk medication use Encounter for monitoring immunosuppressive medication therapy causing immunodeficiency (HCC)   -Currently off CellCept - Recent treatment with Rituxan per Dr. Annitta Needs but given at [facility] health - Noted back on pirfenidone and tolerating it well at full dose. -Liver enzymes mildly and persistently elevated in June 2024 and August 2024   Plan - Continue Esbriet  according to directions from [facility] - do cbc, bmet, lft, bnp and ammonia level  11/01/2022 - spirometry and dlco in 3 months -Await results of telomere testing done at [facility] through [facility]  Coughing and wheezing with elevated IgE  -worsening cough without wheezing x 1 month as of 11/01/2022   Plan - Keep follow-up with Dr. Lucie Leather - try Take prednisone 40 mg daily x 2 days, then 20mg  daily x 2 days, then 10mg  daily x 2 days, then 5mg  daily x 2 days and stop    Abnormal liver CT and cirrhosis with ascites and MELD score  - -Noted [employer] said you not a candidate for lung transplantation in May/June 2024  Plan  - PER GI visit at [facility] - check ammonia level 11/01/2022   Followup -   -3 months ; 30 min visit with Dr Marchelle Gearing

## 2022-11-04 ENCOUNTER — Encounter: Payer: Self-pay | Admitting: Internal Medicine

## 2022-11-05 ENCOUNTER — Other Ambulatory Visit (INDEPENDENT_AMBULATORY_CARE_PROVIDER_SITE_OTHER): Payer: Medicaid Other

## 2022-11-05 ENCOUNTER — Encounter: Payer: Self-pay | Admitting: Internal Medicine

## 2022-11-05 DIAGNOSIS — J84112 Idiopathic pulmonary fibrosis: Secondary | ICD-10-CM | POA: Diagnosis not present

## 2022-11-05 LAB — BASIC METABOLIC PANEL
BUN: 23 mg/dL (ref 6–23)
CO2: 25 meq/L (ref 19–32)
Calcium: 8.8 mg/dL (ref 8.4–10.5)
Chloride: 106 meq/L (ref 96–112)
Creatinine, Ser: 0.74 mg/dL (ref 0.40–1.50)
GFR: 98.66 mL/min (ref >60.00)
Glucose, Bld: 121 mg/dL — ABNORMAL HIGH (ref 70–99)
Potassium: 4 meq/L (ref 3.5–5.1)
Sodium: 135 meq/L (ref 135–145)

## 2022-11-05 LAB — AMMONIA: Ammonia: 60 umol/L — ABNORMAL HIGH (ref 11–35)

## 2022-11-05 LAB — HEPATIC FUNCTION PANEL
ALT: 25 U/L (ref 0–53)
AST: 50 U/L — ABNORMAL HIGH (ref 0–37)
Albumin: 2.1 g/dL — ABNORMAL LOW (ref 3.5–5.2)
Alkaline Phosphatase: 243 U/L — ABNORMAL HIGH (ref 39–117)
Bilirubin, Direct: 0.2 mg/dL (ref 0.0–0.3)
Total Bilirubin: 0.8 mg/dL (ref 0.2–1.2)
Total Protein: 8.8 g/dL — ABNORMAL HIGH (ref 6.0–8.3)

## 2022-11-05 LAB — CBC WITH DIFFERENTIAL/PLATELET
Basophils Absolute: 0.1 10*3/uL (ref 0.0–0.1)
Basophils Relative: 1 % (ref 0.0–3.0)
Eosinophils Absolute: 0.7 10*3/uL (ref 0.0–0.7)
Eosinophils Relative: 10.8 % — ABNORMAL HIGH (ref 0.0–5.0)
HCT: 34.2 % — ABNORMAL LOW (ref 39.0–52.0)
Hemoglobin: 11 g/dL — ABNORMAL LOW (ref 13.0–17.0)
Lymphocytes Relative: 12.3 % (ref 12.0–46.0)
Lymphs Abs: 0.9 10*3/uL (ref 0.7–4.0)
MCHC: 32.1 g/dL (ref 30.0–36.0)
MCV: 109.7 fL — ABNORMAL HIGH (ref 78.0–100.0)
Monocytes Absolute: 0.4 10*3/uL (ref 0.1–1.0)
Monocytes Relative: 6.3 % (ref 3.0–12.0)
Neutro Abs: 4.8 10*3/uL (ref 1.4–7.7)
Neutrophils Relative %: 69.6 % (ref 43.0–77.0)
Platelets: 163 10*3/uL (ref 150.0–400.0)
RBC: 3.12 Mil/uL — ABNORMAL LOW (ref 4.22–5.81)
RDW: 16.4 % — ABNORMAL HIGH (ref 11.5–15.5)
WBC: 7 10*3/uL (ref 4.0–10.5)

## 2022-11-05 LAB — BRAIN NATRIURETIC PEPTIDE: Pro B Natriuretic peptide (BNP): 111 pg/mL — ABNORMAL HIGH (ref 0.0–100.0)

## 2022-11-06 NOTE — Telephone Encounter (Signed)
Maleeha daughter checking on message for elevated Deliah Boston phone number is [phone].

## 2022-11-06 NOTE — Telephone Encounter (Signed)
Do not see any mention of cough med the patient is asking about. Please advise.

## 2022-11-07 MED ORDER — PREDNISONE 10 MG PO TABS: ORAL_TABLET | ORAL | 0 refills | Status: DC

## 2022-11-07 NOTE — Telephone Encounter (Signed)
The first step Rx for cough I told him was the steroid taper and to vsit Kozlow. Did the cma send the pred taper ?And when ishe visitig Kozlow?     Ammonia - level will be high due to liver. Make sure he is talkking to liver doctor and is taking his lactulose and other medicine. This is a baseline to have

## 2022-11-25 ENCOUNTER — Encounter: Payer: Self-pay | Admitting: Internal Medicine

## 2022-12-17 ENCOUNTER — Other Ambulatory Visit: Payer: Self-pay

## 2022-12-17 ENCOUNTER — Ambulatory Visit: Payer: Medicaid Other | Admitting: Allergy and Immunology

## 2022-12-17 ENCOUNTER — Encounter: Payer: Self-pay | Admitting: Allergy and Immunology

## 2022-12-17 VITALS — BP 106/70 | HR 95 | Temp 98.8°F | Resp 18

## 2022-12-17 DIAGNOSIS — K7460 Unspecified cirrhosis of liver: Secondary | ICD-10-CM | POA: Diagnosis not present

## 2022-12-17 DIAGNOSIS — Q999 Chromosomal abnormality, unspecified: Secondary | ICD-10-CM

## 2022-12-17 DIAGNOSIS — J84112 Idiopathic pulmonary fibrosis: Secondary | ICD-10-CM | POA: Diagnosis not present

## 2022-12-17 DIAGNOSIS — D849 Immunodeficiency, unspecified: Secondary | ICD-10-CM

## 2022-12-17 MED ORDER — BUDESONIDE 0.5 MG/2ML IN SUSP
0.5000 mg | Freq: Four times a day (QID) | RESPIRATORY_TRACT | 5 refills | Status: DC
Start: 2022-12-17 — End: 2022-12-20

## 2022-12-17 MED ORDER — ALBUTEROL SULFATE (2.5 MG/3ML) 0.083% IN NEBU: 2.5000 mg | INHALATION_SOLUTION | RESPIRATORY_TRACT | 1 refills | Status: DC | PRN

## 2022-12-17 MED ORDER — MOMETASONE FUROATE 0.1 % EX OINT: TOPICAL_OINTMENT | CUTANEOUS | 0 refills | Status: DC

## 2022-12-17 NOTE — Patient Instructions (Addendum)
  1. Continue oxygen at dose that is needed to maintain oxygen above 88-90%  2. Start daily nebulization 4 times per day:   A. Budesonide 0.5 mg + albuterol 2.5 mg  3. Treat left ear with mometasone 0.1% ointment 1 time per day  4. Blood - IgA/G/M, LFT panel, ammonia level  5. Return to clinic in 3 weeks or earlier if problem

## 2022-12-17 NOTE — Telephone Encounter (Signed)
Updated lab work has been received - DOB verified - given to provider for review.

## 2022-12-17 NOTE — Progress Notes (Unsigned)
[place] - [place] - [place] - [place] - [place]   Follow-up Note  Referring Provider: Kalman Shan, MD Primary Provider: Kalman Shan, MD Date of Office Visit: 12/17/2022  Subjective:   Richard Davenport (DOB: 03/10/1960) is a 62 y.o. 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  I last saw him in this clinic 11 Jun 2022 for his initial evaluation.  He has been to several different academic medical centers and has seen our [facility] pulmonologist and has been treated with multiple medications and at this point in time, reading through the medical record, it appears as though he is not a candidate for lung transplant given his liver disease, and his antifibrotic therapy is pirfenidone and his immunosuppressive therapy is rituximab.  He has 2 major issues, one of which is actually under pretty good control.  One issue is his chronic cough for which he has started gabapentin and this appears to be under pretty good control.  The other is his extreme dyspnea on exertion and he must use 8 liter of nasal cannula oxygen when exerting himself to any degree.  Allergies as of 12/17/2022       Reactions   Pork-derived Products Other (See Comments)   Patient is Muslim and PREFERS TO NOT TAKE ANY PORK OR MEAT PRODUCTS (only fish)        Medication List    Airsupra 90-80 MCG/ACT Aero Generic drug: Albuterol-Budesonide Inhale 2 puffs into the lungs every 4 (four) hours as needed.   albuterol 108 (90 Base) MCG/ACT inhaler Commonly known as: VENTOLIN HFA Inhale 2 puffs into the lungs every 6 (six) hours as needed for wheezing or shortness of breath.   furosemide 40 MG tablet Commonly known as: LASIX Take 0.5 tablets (20 mg total) by mouth daily.   gabapentin 300 MG capsule Commonly known as: NEURONTIN Take 300 mg  by mouth 3 (three) times daily.   metFORMIN 500 MG tablet Commonly known as: GLUCOPHAGE Take 1 tablet (500 mg total) by mouth 2 (two) times daily.   Pirfenidone 267 MG Tabs Take 3 tablets (801 mg total) by mouth with breakfast, with lunch, and with evening meal. Month 2 and onwards   predniSONE 10 MG tablet Commonly known as: DELTASONE 40 mg daily x 2 days, then 20mg  daily x 2 days, then 10mg  daily x 2 days, then 5mg  daily x 2 days and stop   RITUXAN IV Inject into the vein.   spironolactone 50 MG tablet Commonly known as: ALDACTONE Take 50 mg by mouth daily.    Past Medical History:  Diagnosis Date   Asthma    Diabetes mellitus without complication (HCC)    Headache    Idiopathic pulmonary fibrosis (HCC)    Stroke (HCC)    Subarachnoid hemorrhage (HCC)     Past Surgical History:  Procedure Laterality Date   ANEURYSM COILING     for bleed   LOOP RECORDER INSERTION N/A 04/10/2018   Procedure: LOOP RECORDER INSERTION;  Surgeon: Duke Salvia, MD;  Location: [facility] INVASIVE CV LAB;  Service: Cardiovascular;  Laterality: N/A;   RADIOLOGY WITH ANESTHESIA N/A 10/09/2012   Procedure: RADIOLOGY WITH ANESTHESIA;  Surgeon: Lisbeth Renshaw, MD;  Location: [facility] OR;  Service: Radiology;  Laterality: N/A;   RADIOLOGY WITH ANESTHESIA N/A 05/27/2013   Procedure: RADIOLOGY WITH ANESTHESIA;  Surgeon: Marlane Hatcher  Conchita Paris, MD;  Location: [facility] OR;  Service: Radiology;  Laterality: N/A;    Review of systems negative except as noted in HPI / PMHx or noted below:  Review of Systems  Constitutional: Negative.   HENT: Negative.    Eyes: Negative.   Respiratory: Negative.    Cardiovascular: Negative.   Gastrointestinal: Negative.   Genitourinary: Negative.   Musculoskeletal: Negative.   Skin: Negative.   Neurological: Negative.   Endo/Heme/Allergies: Negative.   Psychiatric/Behavioral: Negative.       Objective:   Vitals:   12/17/22 1344  BP: 106/70  Pulse: 95  Resp: 18  Temp: 98.8 F  (37.1 C)  SpO2: 99%          Physical Exam Constitutional:      Appearance: He is not diaphoretic.  HENT:     Head: Normocephalic.     Right Ear: Tympanic membrane, ear canal and external ear normal.     Left Ear: Tympanic membrane, ear canal and external ear normal.     Nose: Nose normal. No mucosal edema or rhinorrhea.     Mouth/Throat:     Pharynx: Uvula midline. No oropharyngeal exudate.  Eyes:     Conjunctiva/sclera: Conjunctivae normal.  Neck:     Thyroid: No thyromegaly.     Trachea: Trachea normal. No tracheal tenderness or tracheal deviation.  Cardiovascular:     Rate and Rhythm: Normal rate and regular rhythm.     Heart sounds: Normal heart sounds, S1 normal and S2 normal. No murmur heard. Pulmonary:     Effort: No respiratory distress.     Breath sounds: No stridor. Wheezing (Bilateral expiratory wheezes all lung fields) present. No rales.  Lymphadenopathy:     Head:     Right side of head: No tonsillar adenopathy.     Left side of head: No tonsillar adenopathy.     Cervical: No cervical adenopathy.  Skin:    Findings: Rash (erythema, induration, scale left external ear without tenderness.) present. No erythema.     Nails: There is no clubbing.  Neurological:     Mental Status: He is alert.     Diagnostics:    Spirometry was performed and demonstrated an FEV1 of 0.88 at 28 % of predicted.  Following the administration of nebulized albuterol his FEV1 rose 6%.  The patient had an Asthma Control Test with the following results: ACT Total Score: 12.    Assessment and Plan:   1. 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 Idiopathic pulmonary fibrosis (HCC)   3. Cirrhosis of liver without ascites, unspecified hepatic cirrhosis type (HCC)   4. Immunosuppression (HCC)    1. Continue oxygen at dose that is needed to maintain oxygen above 88-90%  2. Start daily nebulization 4 times per day:   A. Budesonide 0.5 mg + albuterol 2.5 mg  3. Treat left  ear with mometasone 0.1% ointment 1 time per day  4. Blood - IgA/G/M, LFT panel, ammonia level  5. Return to clinic in 3 weeks or earlier if problem  [name] has a pretty significant issue giving rise to severe dysfunction of his airway and his liver.  He does like the effect that he gets from albuterol and that is the one medication that gives him some relief and we will allow him to use albuterol nebulization in conjunction with budesonide up to 4 times a day.  He is on rituximab and we will check his immunoglobulin levels and his daughter is  interested in also rechecking his liver function tests and we will do so as well.  He has inflammation of his left ear and we will treat him with some topical anti-inflammatory agents and see what type of response we get over the course of the next several weeks.  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  On one additional note, I did have a talk with he and his daughter today about the fact that he has a progressive chronic disease that is affecting his liver and lungs and is compromising the function of both of these organ systems and his life expectancy is probably going to be shorter as a result of this process and for he and his family to plan accordingly regarding this issue.  And, given the fact that the pulmonary-GI manifestations of short telomere syndrome are usually associated with an autosomal dominant defect we may need to screen the family and I will contact [facility] regarding further management of gene mutation detection and screening other individuals within the family.  Laurette Schimke, MD Allergy / Immunology [facility]

## 2022-12-18 ENCOUNTER — Encounter: Payer: Self-pay | Admitting: Allergy and Immunology

## 2022-12-18 LAB — AMMONIA: Ammonia: 85 ug/dL (ref 40–200)

## 2022-12-18 LAB — HEPATIC FUNCTION PANEL
ALT: 21 [IU]/L (ref 0–44)
AST: 44 [IU]/L — ABNORMAL HIGH (ref 0–40)
Albumin: 2.1 g/dL — ABNORMAL LOW (ref 3.8–4.9)
Alkaline Phosphatase: 316 [IU]/L — ABNORMAL HIGH (ref 44–121)
Bilirubin Total: 0.8 mg/dL (ref 0.0–1.2)
Bilirubin, Direct: 0.37 mg/dL (ref 0.00–0.40)
Total Protein: 8.5 g/dL (ref 6.0–8.5)

## 2022-12-18 LAB — IGG, IGA, IGM
IgA/Immunoglobulin A, Serum: 1522 mg/dL — ABNORMAL HIGH (ref 90–386)
IgG (Immunoglobin G), Serum: 4627 mg/dL — ABNORMAL HIGH (ref 603–1613)
IgM (Immunoglobulin M), Srm: 64 mg/dL (ref 20–172)

## 2022-12-19 ENCOUNTER — Encounter (HOSPITAL_COMMUNITY): Payer: Self-pay | Admitting: Internal Medicine

## 2022-12-19 ENCOUNTER — Other Ambulatory Visit: Payer: Self-pay

## 2022-12-19 ENCOUNTER — Inpatient Hospital Stay (HOSPITAL_COMMUNITY)
Admission: EM | Admit: 2022-12-19 | Discharge: 2022-12-20 | DRG: 189 | Disposition: A | Discharge status: Home / Self Care | Payer: Medicaid Other | Attending: Internal Medicine | Admitting: Internal Medicine

## 2022-12-19 ENCOUNTER — Emergency Department (HOSPITAL_COMMUNITY): Payer: Medicaid Other

## 2022-12-19 DIAGNOSIS — E669 Obesity, unspecified: Secondary | ICD-10-CM | POA: Diagnosis present

## 2022-12-19 DIAGNOSIS — D638 Anemia in other chronic diseases classified elsewhere: Secondary | ICD-10-CM | POA: Diagnosis present

## 2022-12-19 DIAGNOSIS — Z9981 Dependence on supplemental oxygen: Secondary | ICD-10-CM

## 2022-12-19 DIAGNOSIS — E43 Unspecified severe protein-calorie malnutrition: Secondary | ICD-10-CM | POA: Insufficient documentation

## 2022-12-19 DIAGNOSIS — I671 Cerebral aneurysm, nonruptured: Secondary | ICD-10-CM | POA: Diagnosis present

## 2022-12-19 DIAGNOSIS — Z91014 Allergy to mammalian meats: Secondary | ICD-10-CM

## 2022-12-19 DIAGNOSIS — Z1152 Encounter for screening for COVID-19: Secondary | ICD-10-CM | POA: Diagnosis not present

## 2022-12-19 DIAGNOSIS — E785 Hyperlipidemia, unspecified: Secondary | ICD-10-CM | POA: Diagnosis present

## 2022-12-19 DIAGNOSIS — J9621 Acute and chronic respiratory failure with hypoxia: Secondary | ICD-10-CM | POA: Diagnosis present

## 2022-12-19 DIAGNOSIS — E1165 Type 2 diabetes mellitus with hyperglycemia: Secondary | ICD-10-CM | POA: Diagnosis present

## 2022-12-19 DIAGNOSIS — Z7951 Long term (current) use of inhaled steroids: Secondary | ICD-10-CM

## 2022-12-19 DIAGNOSIS — Z6823 Body mass index (BMI) 23.0-23.9, adult: Secondary | ICD-10-CM

## 2022-12-19 DIAGNOSIS — E876 Hypokalemia: Secondary | ICD-10-CM | POA: Diagnosis present

## 2022-12-19 DIAGNOSIS — Z8679 Personal history of other diseases of the circulatory system: Secondary | ICD-10-CM | POA: Diagnosis not present

## 2022-12-19 DIAGNOSIS — Z8673 Personal history of transient ischemic attack (TIA), and cerebral infarction without residual deficits: Secondary | ICD-10-CM | POA: Diagnosis not present

## 2022-12-19 DIAGNOSIS — Z79899 Other long term (current) drug therapy: Secondary | ICD-10-CM

## 2022-12-19 DIAGNOSIS — Z7984 Long term (current) use of oral hypoglycemic drugs: Secondary | ICD-10-CM | POA: Diagnosis not present

## 2022-12-19 DIAGNOSIS — J441 Chronic obstructive pulmonary disease with (acute) exacerbation: Principal | ICD-10-CM | POA: Diagnosis present

## 2022-12-19 DIAGNOSIS — R112 Nausea with vomiting, unspecified: Secondary | ICD-10-CM | POA: Diagnosis present

## 2022-12-19 DIAGNOSIS — J9602 Acute respiratory failure with hypercapnia: Secondary | ICD-10-CM | POA: Diagnosis not present

## 2022-12-19 DIAGNOSIS — J9622 Acute and chronic respiratory failure with hypercapnia: Principal | ICD-10-CM | POA: Diagnosis present

## 2022-12-19 DIAGNOSIS — J84112 Idiopathic pulmonary fibrosis: Secondary | ICD-10-CM | POA: Diagnosis present

## 2022-12-19 DIAGNOSIS — J8410 Pulmonary fibrosis, unspecified: Secondary | ICD-10-CM | POA: Diagnosis present

## 2022-12-19 LAB — URINALYSIS, ROUTINE W REFLEX MICROSCOPIC
Bacteria, UA: NONE SEEN
Bilirubin Urine: NEGATIVE
Glucose, UA: NEGATIVE mg/dL
Ketones, ur: NEGATIVE mg/dL
Leukocytes,Ua: NEGATIVE
Nitrite: NEGATIVE
Protein, ur: NEGATIVE mg/dL
Specific Gravity, Urine: 1.021 (ref 1.005–1.030)
pH: 6 (ref 5.0–8.0)

## 2022-12-19 LAB — CBC WITH DIFFERENTIAL/PLATELET
Abs Immature Granulocytes: 0 10*3/uL (ref 0.00–0.07)
Basophils Absolute: 0 10*3/uL (ref 0.0–0.1)
Basophils Relative: 0 %
Eosinophils Absolute: 0.3 10*3/uL (ref 0.0–0.5)
Eosinophils Relative: 4 %
HCT: 32.2 % — ABNORMAL LOW (ref 39.0–52.0)
Hemoglobin: 10 g/dL — ABNORMAL LOW (ref 13.0–17.0)
Lymphocytes Relative: 1 %
Lymphs Abs: 0.1 10*3/uL — ABNORMAL LOW (ref 0.7–4.0)
MCH: 35.3 pg — ABNORMAL HIGH (ref 26.0–34.0)
MCHC: 31.1 g/dL (ref 30.0–36.0)
MCV: 113.8 fL — ABNORMAL HIGH (ref 80.0–100.0)
Monocytes Absolute: 0 10*3/uL — ABNORMAL LOW (ref 0.1–1.0)
Monocytes Relative: 0 %
Neutro Abs: 6.7 10*3/uL (ref 1.7–7.7)
Neutrophils Relative %: 95 %
Platelets: 172 10*3/uL (ref 150–400)
RBC: 2.83 MIL/uL — ABNORMAL LOW (ref 4.22–5.81)
RDW: 16 % — ABNORMAL HIGH (ref 11.5–15.5)
WBC: 7.1 10*3/uL (ref 4.0–10.5)
nRBC: 0 % (ref 0.0–0.2)
nRBC: 0 /100{WBCs}

## 2022-12-19 LAB — RESP PANEL BY RT-PCR (RSV, FLU A&B, COVID)  RVPGX2
Influenza A by PCR: NEGATIVE
Influenza B by PCR: NEGATIVE
Resp Syncytial Virus by PCR: NEGATIVE
SARS Coronavirus 2 by RT PCR: NEGATIVE

## 2022-12-19 LAB — COMPREHENSIVE METABOLIC PANEL
ALT: 16 U/L (ref 0–44)
ALT: 21 U/L (ref 0–44)
AST: 34 U/L (ref 15–41)
AST: 42 U/L — ABNORMAL HIGH (ref 15–41)
Albumin: <1.5 g/dL — ABNORMAL LOW (ref 3.5–5.0)
Albumin: 1.5 g/dL — ABNORMAL LOW (ref 3.5–5.0)
Alkaline Phosphatase: 159 U/L — ABNORMAL HIGH (ref 38–126)
Alkaline Phosphatase: 191 U/L — ABNORMAL HIGH (ref 38–126)
Anion gap: 7 (ref 5–15)
Anion gap: 11 (ref 5–15)
BUN: 11 mg/dL (ref 6–20)
BUN: 13 mg/dL (ref 6–20)
CO2: 22 mmol/L (ref 22–32)
CO2: 25 mmol/L (ref 22–32)
Calcium: 6.8 mg/dL — ABNORMAL LOW (ref 8.9–10.3)
Calcium: 8.4 mg/dL — ABNORMAL LOW (ref 8.9–10.3)
Chloride: 113 mmol/L — ABNORMAL HIGH (ref 98–111)
Chloride: 104 mmol/L (ref 98–111)
Creatinine, Ser: 0.8 mg/dL (ref 0.61–1.24)
Creatinine, Ser: 1.01 mg/dL (ref 0.61–1.24)
GFR, Estimated: >60 mL/min (ref >60)
GFR, Estimated: >60 mL/min (ref >60)
Glucose, Bld: 134 mg/dL — ABNORMAL HIGH (ref 70–99)
Glucose, Bld: 176 mg/dL — ABNORMAL HIGH (ref 70–99)
Potassium: 3 mmol/L — ABNORMAL LOW (ref 3.5–5.1)
Potassium: 4.2 mmol/L (ref 3.5–5.1)
Sodium: 142 mmol/L (ref 135–145)
Sodium: 140 mmol/L (ref 135–145)
Total Bilirubin: 0.9 mg/dL (ref <1.2)
Total Bilirubin: 1.1 mg/dL (ref <1.2)
Total Protein: 7.2 g/dL (ref 6.5–8.1)
Total Protein: 9.1 g/dL — ABNORMAL HIGH (ref 6.5–8.1)

## 2022-12-19 LAB — I-STAT VENOUS BLOOD GAS, ED
Acid-Base Excess: 1 mmol/L (ref 0.0–2.0)
Bicarbonate: 26.5 mmol/L (ref 20.0–28.0)
Calcium, Ion: 1 mmol/L — ABNORMAL LOW (ref 1.15–1.40)
HCT: 32 % — ABNORMAL LOW (ref 39.0–52.0)
Hemoglobin: 10.9 g/dL — ABNORMAL LOW (ref 13.0–17.0)
O2 Saturation: 95 %
Potassium: 5.3 mmol/L — ABNORMAL HIGH (ref 3.5–5.1)
Sodium: 143 mmol/L (ref 135–145)
TCO2: 28 mmol/L (ref 22–32)
pCO2, Ven: 43.5 mm[Hg] — ABNORMAL LOW (ref 44–60)
pH, Ven: 7.393 (ref 7.25–7.43)
pO2, Ven: 77 mm[Hg] — ABNORMAL HIGH (ref 32–45)

## 2022-12-19 LAB — TROPONIN I (HIGH SENSITIVITY)
Troponin I (High Sensitivity): 6 ng/L (ref <18)
Troponin I (High Sensitivity): 7 ng/L (ref <18)

## 2022-12-19 LAB — GLUCOSE, CAPILLARY: Glucose-Capillary: 204 mg/dL — ABNORMAL HIGH (ref 70–99)

## 2022-12-19 LAB — I-STAT CG4 LACTIC ACID, ED: Lactic Acid, Venous: 2.6 mmol/L (ref 0.5–1.9)

## 2022-12-19 LAB — LIPASE, BLOOD: Lipase: 22 U/L (ref 11–51)

## 2022-12-19 MED ORDER — SODIUM CHLORIDE 0.45 % IV SOLN: INTRAVENOUS | Status: DC

## 2022-12-19 MED ORDER — MAGNESIUM SULFATE 2 GM/50ML IV SOLN
2.0000 g | Freq: Once | INTRAVENOUS | Status: AC
  Administered 2022-12-19: 2 g via INTRAVENOUS
  Filled 2022-12-19: qty 50

## 2022-12-19 MED ORDER — METHYLPREDNISOLONE SODIUM SUCC 125 MG IJ SOLR
125.0000 mg | Freq: Two times a day (BID) | INTRAMUSCULAR | Status: DC
  Administered 2022-12-20: 125 mg via INTRAVENOUS
  Filled 2022-12-19: qty 2

## 2022-12-19 MED ORDER — INSULIN ASPART 100 UNIT/ML IJ SOLN
0.0000 [IU] | Freq: Three times a day (TID) | INTRAMUSCULAR | Status: DC
  Administered 2022-12-20: 3 [IU] via SUBCUTANEOUS

## 2022-12-19 MED ORDER — SODIUM CHLORIDE 0.9 % IV SOLN
1.0000 g | Freq: Once | INTRAVENOUS | Status: AC
  Administered 2022-12-19: 1 g via INTRAVENOUS
  Filled 2022-12-19: qty 10

## 2022-12-19 MED ORDER — LACTATED RINGERS IV BOLUS
1000.0000 mL | Freq: Once | INTRAVENOUS | Status: AC
  Administered 2022-12-19: 1000 mL via INTRAVENOUS

## 2022-12-19 MED ORDER — BUDESONIDE 0.5 MG/2ML IN SUSP
0.5000 mg | Freq: Two times a day (BID) | RESPIRATORY_TRACT | Status: DC
  Administered 2022-12-19 – 2022-12-20 (×2): 0.5 mg via RESPIRATORY_TRACT
  Filled 2022-12-19 (×2): qty 2

## 2022-12-19 MED ORDER — POTASSIUM CHLORIDE CRYS ER 20 MEQ PO TBCR
40.0000 meq | EXTENDED_RELEASE_TABLET | Freq: Once | ORAL | Status: AC
  Administered 2022-12-19: 40 meq via ORAL
  Filled 2022-12-19: qty 2

## 2022-12-19 MED ORDER — METHYLPREDNISOLONE SODIUM SUCC 125 MG IJ SOLR
125.0000 mg | Freq: Once | INTRAMUSCULAR | Status: AC
  Administered 2022-12-19: 125 mg via INTRAVENOUS
  Filled 2022-12-19: qty 2

## 2022-12-19 MED ORDER — SODIUM CHLORIDE 0.9 % IV SOLN
500.0000 mg | Freq: Once | INTRAVENOUS | Status: AC
  Administered 2022-12-19: 500 mg via INTRAVENOUS
  Filled 2022-12-19: qty 5

## 2022-12-19 MED ORDER — SODIUM CHLORIDE 0.9 % IV SOLN
1.0000 g | INTRAVENOUS | Status: DC
  Filled 2022-12-19: qty 10

## 2022-12-19 MED ORDER — PIRFENIDONE 267 MG PO TABS
534.0000 mg | ORAL_TABLET | Freq: Three times a day (TID) | ORAL | Status: DC
  Administered 2022-12-19 – 2022-12-20 (×3): 534 mg via ORAL
  Filled 2022-12-19 (×5): qty 2

## 2022-12-19 MED ORDER — PREDNISONE 20 MG PO TABS: 40.0000 mg | ORAL_TABLET | Freq: Every day | ORAL | Status: DC

## 2022-12-19 MED ORDER — IPRATROPIUM-ALBUTEROL 0.5-2.5 (3) MG/3ML IN SOLN
3.0000 mL | RESPIRATORY_TRACT | Status: AC
  Administered 2022-12-19 (×3): 3 mL via RESPIRATORY_TRACT
  Filled 2022-12-19 (×3): qty 3

## 2022-12-19 MED ORDER — GABAPENTIN 300 MG PO CAPS
300.0000 mg | ORAL_CAPSULE | Freq: Three times a day (TID) | ORAL | Status: DC
  Administered 2022-12-19 – 2022-12-20 (×2): 300 mg via ORAL
  Filled 2022-12-19 (×2): qty 1

## 2022-12-19 MED ORDER — INSULIN ASPART 100 UNIT/ML IJ SOLN
0.0000 [IU] | Freq: Every day | INTRAMUSCULAR | Status: DC
  Administered 2022-12-19: 2 [IU] via SUBCUTANEOUS

## 2022-12-19 MED ORDER — GUAIFENESIN 100 MG/5ML PO LIQD
5.0000 mL | Freq: Four times a day (QID) | ORAL | Status: DC | PRN
  Administered 2022-12-19: 5 mL via ORAL
  Filled 2022-12-19: qty 15

## 2022-12-19 NOTE — ED Notes (Signed)
ED TO INPATIENT HANDOFF REPORT  ED Nurse Name and Phone #:  Molli Hazard [phone]  S Name/Age/Gender Imogene Burn 62 y.o. male Room/Bed: 028C/028C  Code Status   Code Status: Prior  Home/SNF/Other Home Patient oriented to: self, place, time and situation Is this baseline? Yes   Triage Complete: Triage complete  Chief Complaint Acute respiratory failure with hypercapnia (HCC) [J96.02]  Triage Note Patient arrives by EMS from home for c/o vomiting since 10 AM, reports vomited 4 x and has not been able to eat or drink and has not taken his medications today.    Patient also reports feeling malaise and short of breath.   Patient received 1 duo-neb by EMS and patient now reports feeling less short of breath.  Patient is on oxygen 4-5L at home baseline.    Allergies Allergies  Allergen Reactions   Pork-Derived Products Other (See Comments)    Patient is Muslim and PREFERS TO NOT TAKE ANY PORK OR MEAT PRODUCTS (only fish)    Level of Care/Admitting Diagnosis ED Disposition     ED Disposition  Admit   Condition  --   Comment  Hospital Area: [facility] [[id number]]  Level of Care: Telemetry Medical [[id number]]  May admit patient to [facility] or [facility] if equivalent level of care is available:: Yes  Covid Evaluation: Confirmed COVID Negative  Diagnosis: Acute respiratory failure with hypercapnia [facility]) [[id number]]  Admitting Physician: Rometta Emery [[id number]]  Attending Physician: Rometta Emery [[id number]]  Certification:: I certify this patient will need inpatient services for at least 2 midnights  Expected Medical Readiness: 12/21/2022          B Medical/Surgery History Past Medical History:  Diagnosis Date   Asthma    Diabetes mellitus without complication (HCC)    Headache    Idiopathic pulmonary fibrosis (HCC)    Stroke (HCC)    Subarachnoid hemorrhage (HCC)    Past Surgical History:  Procedure Laterality Date   ANEURYSM COILING     for bleed    LOOP RECORDER INSERTION N/A 04/10/2018   Procedure: LOOP RECORDER INSERTION;  Surgeon: Duke Salvia, MD;  Location: [facility] INVASIVE CV LAB;  Service: Cardiovascular;  Laterality: N/A;   RADIOLOGY WITH ANESTHESIA N/A 10/09/2012   Procedure: RADIOLOGY WITH ANESTHESIA;  Surgeon: Lisbeth Renshaw, MD;  Location: [facility] OR;  Service: Radiology;  Laterality: N/A;   RADIOLOGY WITH ANESTHESIA N/A 05/27/2013   Procedure: RADIOLOGY WITH ANESTHESIA;  Surgeon: Lisbeth Renshaw, MD;  Location: [facility] OR;  Service: Radiology;  Laterality: N/A;     A IV Location/Drains/Wounds Patient Lines/Drains/Airways Status     Active Line/Drains/Airways     Name Placement date Placement time Site Days   Peripheral IV 12/19/22 18 G Left Antecubital 12/19/22  --  Antecubital  less than 1   Peripheral IV 12/19/22 18 G Right Antecubital 12/19/22  1722  Antecubital  less than 1            Intake/Output Last 24 hours No intake or output data in the 24 hours ending 12/19/22 1914  Labs/Imaging Results for orders placed or performed during the hospital encounter of 12/19/22 (from the past 48 hour(s))  Troponin I (High Sensitivity)     Status: None   Collection Time: 12/19/22  4:04 PM  Result Value Ref Range   Troponin I (High Sensitivity) 6 <18 ng/L    Comment: (NOTE) Elevated high sensitivity troponin I (hsTnI) values and significant  changes across serial measurements may suggest ACS  but many other  chronic and acute conditions are known to elevate hsTnI results.  Refer to the "Links" section for chest pain algorithms and additional  guidance. Performed at [facility], [address] [place], [place] [address]   CBC with Differential     Status: Abnormal   Collection Time: 12/19/22  4:04 PM  Result Value Ref Range   WBC 7.1 4.0 - 10.5 K/uL   RBC 2.83 (L) 4.22 - 5.81 MIL/uL   Hemoglobin 10.0 (L) 13.0 - 17.0 g/dL   HCT 60.4 (L) 54.0 - 98.1 %   MCV 113.8 (H) 80.0 - 100.0 fL   MCH 35.3 (H) 26.0 - 34.0 pg    MCHC 31.1 30.0 - 36.0 g/dL   RDW 19.1 (H) 47.8 - 29.5 %   Platelets 172 150 - 400 K/uL   nRBC 0.0 0.0 - 0.2 %   Neutrophils Relative % 95 %   Neutro Abs 6.7 1.7 - 7.7 K/uL   Lymphocytes Relative 1 %   Lymphs Abs 0.1 (L) 0.7 - 4.0 K/uL   Monocytes Relative 0 %   Monocytes Absolute 0.0 (L) 0.1 - 1.0 K/uL   Eosinophils Relative 4 %   Eosinophils Absolute 0.3 0.0 - 0.5 K/uL   Basophils Relative 0 %   Basophils Absolute 0.0 0.0 - 0.1 K/uL   nRBC 0 0 /100 WBC   Abs Immature Granulocytes 0.00 0.00 - 0.07 K/uL    Comment: Performed at [facility], [address] [place], [place] [address]  Resp panel by RT-PCR (RSV, Flu A&B, Covid) Anterior Nasal Swab     Status: None   Collection Time: 12/19/22  4:04 PM   Specimen: Anterior Nasal Swab  Result Value Ref Range   SARS Coronavirus 2 by RT PCR NEGATIVE NEGATIVE   Influenza A by PCR NEGATIVE NEGATIVE   Influenza B by PCR NEGATIVE NEGATIVE    Comment: (NOTE) The Xpert Xpress SARS-CoV-2/FLU/RSV plus assay is intended as an aid in the diagnosis of influenza from Nasopharyngeal swab specimens and should not be used as a sole basis for treatment. Nasal washings and aspirates are unacceptable for Xpert Xpress SARS-CoV-2/FLU/RSV testing.  Fact Sheet for Patients: BloggerCourse.com  Fact Sheet for Healthcare Providers: SeriousBroker.it  This test is not yet approved or cleared by the [place] FDA and has been authorized for detection and/or diagnosis of SARS-CoV-2 by FDA under an Emergency Use Authorization (EUA). This EUA will remain in effect (meaning this test can be used) for the duration of the COVID-19 declaration under Section 564(b)(1) of the Act, 21 U.S.C. section 360bbb-3(b)(1), unless the authorization is terminated or revoked.     Resp Syncytial Virus by PCR NEGATIVE NEGATIVE    Comment: (NOTE) Fact Sheet for  Patients: BloggerCourse.com  Fact Sheet for Healthcare Providers: SeriousBroker.it  This test is not yet approved or cleared by the [place] FDA and has been authorized for detection and/or diagnosis of SARS-CoV-2 by FDA under an Emergency Use Authorization (EUA). This EUA will remain in effect (meaning this test can be used) for the duration of the COVID-19 declaration under Section 564(b)(1) of the Act, 21 U.S.C. section 360bbb-3(b)(1), unless the authorization is terminated or revoked.  Performed at [facility], [address] [place], [place] [address]   I-Stat venous blood gas, ED     Status: Abnormal   Collection Time: 12/19/22  4:09 PM  Result Value Ref Range   pH, Ven 7.393 7.25 - 7.43  pCO2, Ven 43.5 (L) 44 - 60 mmHg   pO2, Ven 77 (H) 32 - 45 mmHg   Bicarbonate 26.5 20.0 - 28.0 mmol/L   TCO2 28 22 - 32 mmol/L   O2 Saturation 95 %   Acid-Base Excess 1.0 0.0 - 2.0 mmol/L   Sodium 143 135 - 145 mmol/L   Potassium 5.3 (H) 3.5 - 5.1 mmol/L   Calcium, Ion 1.00 (L) 1.15 - 1.40 mmol/L   HCT 32.0 (L) 39.0 - 52.0 %   Hemoglobin 10.9 (L) 13.0 - 17.0 g/dL   Sample type VENOUS   Comprehensive metabolic panel     Status: Abnormal   Collection Time: 12/19/22  4:32 PM  Result Value Ref Range   Sodium 142 135 - 145 mmol/L   Potassium 3.0 (L) 3.5 - 5.1 mmol/L   Chloride 113 (H) 98 - 111 mmol/L   CO2 22 22 - 32 mmol/L   Glucose, Bld 134 (H) 70 - 99 mg/dL    Comment: Glucose reference range applies only to samples taken after fasting for at least 8 hours.   BUN 11 6 - 20 mg/dL   Creatinine, Ser 6.38 0.61 - 1.24 mg/dL   Calcium 6.8 (L) 8.9 - 10.3 mg/dL   Total Protein 7.2 6.5 - 8.1 g/dL   Albumin <7.5 (L) 3.5 - 5.0 g/dL   AST 34 15 - 41 U/L   ALT 16 0 - 44 U/L   Alkaline Phosphatase 159 (H) 38 - 126 U/L   Total Bilirubin 0.9 <1.2 mg/dL   GFR, Estimated >64 >33 mL/min    Comment: (NOTE) Calculated using the CKD-EPI  Creatinine Equation (2021)    Anion gap 7 5 - 15    Comment: Performed at [facility], [address] [place], [place] [address]  Lipase, blood     Status: None   Collection Time: 12/19/22  4:32 PM  Result Value Ref Range   Lipase 22 11 - 51 U/L    Comment: Performed at [facility], [address] [place], [place] [address]  I-Stat Lactic Acid     Status: Abnormal   Collection Time: 12/19/22  6:39 PM  Result Value Ref Range   Lactic Acid, Venous 2.6 (HH) 0.5 - 1.9 mmol/L   Comment NOTIFIED PHYSICIAN    DG Chest Port 1 View  Result Date: 12/19/2022 CLINICAL DATA:  Shortness of breath. Multiple episodes of vomiting. History of pulmonary fibrosis. EXAM: PORTABLE CHEST 1 VIEW COMPARISON:  Chest x-ray 05/11/2022. FINDINGS: Underinflation. Again areas of interstitial septal thickening identified particularly right upper lung and left lung base. There is more opacity than on prior. This could relate to the poor inflation although superimposed infiltrates not excluded. Recommend close follow-up. No pneumothorax or effusion. Normal cardiopericardial silhouette when adjusting for technique. Overlapping cardiac leads. Air-fluid level along the stomach beneath the left hemidiaphragm. Loop recorder. IMPRESSION: Underinflation. Again areas of interstitial septal thickening identified particularly right upper lung and left lung base. There is more opacity than on prior. This could relate to the poor inflation although superimposed infiltrates not excluded. Recommend close follow-up. Electronically Signed   By: Karen Kays M.D.   On: 12/19/2022 16:31    Pending Labs Unresulted Labs (From admission, onward)     Start     Ordered   12/19/22 1651  Blood culture (routine x 2)  BLOOD CULTURE X 2,   R (with STAT occurrences)      12/19/22 1650   12/19/22 1629  Comprehensive metabolic panel  Once,   STAT        12/19/22 1628   12/19/22 1604  Urinalysis, Routine w reflex microscopic -Urine,  Clean Catch  Once,   URGENT       Question:  Specimen Source  Answer:  Urine, Clean Catch   12/19/22 1603            Vitals/Pain Today's Vitals   12/19/22 1544 12/19/22 1545 12/19/22 1615 12/19/22 1715  BP:   109/81 117/84  Pulse:   (!) 121 (!) 111  Resp:   (!) 38 (!) 21  Temp:      TempSrc:      SpO2: 97%  99% 100%  Weight:  152 lb 1.9 oz (69 kg)    Height:  5\' 8"  (1.727 m)    PainSc:  0-No pain      Isolation Precautions No active isolations  Medications Medications  lactated ringers bolus 1,000 mL (0 mLs Intravenous Stopped 12/19/22 1740)  cefTRIAXone (ROCEPHIN) 1 g in sodium chloride 0.9 % 100 mL IVPB (0 g Intravenous Stopped 12/19/22 1744)  azithromycin (ZITHROMAX) 500 mg in sodium chloride 0.9 % 250 mL IVPB (0 mg Intravenous Stopped 12/19/22 1906)  ipratropium-albuterol (DUONEB) 0.5-2.5 (3) MG/3ML nebulizer solution 3 mL (3 mLs Nebulization Given 12/19/22 1906)  magnesium sulfate IVPB 2 g 50 mL (0 g Intravenous Stopped 12/19/22 1906)  methylPREDNISolone sodium succinate (SOLU-MEDROL) 125 mg/2 mL injection 125 mg (125 mg Intravenous Given 12/19/22 1739)  potassium chloride SA (KLOR-CON M) CR tablet 40 mEq (40 mEq Oral Given 12/19/22 1906)    Mobility walks     Focused Assessments Cardiac Assessment Handoff:  Cardiac Rhythm: Sinus tachycardia Lab Results  Component Value Date   TROPONINI <0.30 10/08/2012   No results found for: "DDIMER" Does the Patient currently have chest pain? No   , Neuro Assessment Handoff:  Swallow screen pass? Yes  Cardiac Rhythm: Sinus tachycardia       Neuro Assessment:   Neuro Checks:      Has TPA been given?  If patient is a Neuro Trauma and patient is going to OR before floor call report to 4N Charge nurse: [phone] or [phone]  , Renal Assessment Handoff:  Hemodialysis Schedule:  Last Hemodialysis date and time:   Restricted appendage:   , Pulmonary Assessment Handoff:  Lung sounds: Bilateral Breath  Sounds: Expiratory wheezes, Inspiratory wheezes L Breath Sounds: Expiratory wheezes, Inspiratory wheezes R Breath Sounds: Expiratory wheezes, Inspiratory wheezes O2 Device: Nasal Cannula O2 Flow Rate (L/min): 5 L/min    R Recommendations: See Admitting Provider Note  Report given to:   Additional Notes:   Came in for SOB and N/V. Initial wheezing in all fields with hx of pulmonary fibrosis. Multiple duo nebs with crackles/diminished, Has received his antibiotics and labs complete. Pt is normally on 5 lpm of O2 at home. Bilateral 18g IV's. GCS 15. Family is with Pt. Has not produced urine in ED.

## 2022-12-19 NOTE — ED Triage Notes (Signed)
Patient arrives by EMS from home for c/o vomiting since 10 AM, reports vomited 4 x and has not been able to eat or drink and has not taken his medications today.    Patient also reports feeling malaise and short of breath.   Patient received 1 duo-neb by EMS and patient now reports feeling less short of breath.  Patient is on oxygen 4-5L at home baseline.

## 2022-12-19 NOTE — ED Provider Notes (Signed)
[place] EMERGENCY DEPARTMENT AT [facility] Provider Note   CSN: [id number] Arrival date & time: 12/19/22  1539     History  Chief Complaint  Patient presents with   Shortness of Breath   Vomiting    Richard Davenport is a 62 y.o. male.   Shortness of Breath 61 year old male history of diabetes, idiopathic pulmonary fibrosis, COPD on baseline 4 to 5 L, prior TIA and stroke, type 2 diabetes presenting for emesis.  He states he woke up this morning his had about 3 episodes of nonbloody and bilious emesis.  He had abdominal about 2 hours ago which was normal.  No chest pain, no shortness of breath.  No abdominal pain.  He received a DuoNeb with EMS and is feeling somewhat better.  He felt slightly more short of breath today.  No productive cough.  No fevers.  No known sick contacts.  No headache or neurologic symptoms.     Home Medications Prior to Admission medications   Medication Sig Start Date End Date Taking? Authorizing Provider  albuterol (PROVENTIL) (2.5 MG/3ML) 0.083% nebulizer solution Take 3 mLs (2.5 mg total) by nebulization every 4 (four) hours as needed for wheezing or shortness of breath. 12/17/22  Yes Kozlow, Alvira Philips, MD  albuterol (VENTOLIN HFA) 108 (90 Base) MCG/ACT inhaler Inhale 2 puffs into the lungs every 6 (six) hours as needed for wheezing or shortness of breath. 02/18/22  Yes Olalere, Adewale A, MD  budesonide (PULMICORT) 0.5 MG/2ML nebulizer solution Take 2 mLs (0.5 mg total) by nebulization in the morning, at noon, in the evening, and at bedtime. 12/17/22  Yes Kozlow, Alvira Philips, MD  dextromethorphan (DELSYM) 30 MG/5ML liquid Take 30 mg by mouth 2 (two) times daily as needed for cough.   Yes [provider]  furosemide (LASIX) 40 MG tablet Take 0.5 tablets (20 mg total) by mouth daily. 05/20/22 05/20/23 Yes Arnetha Courser, MD  gabapentin (NEURONTIN) 300 MG capsule Take 300 mg by mouth 3 (three) times daily. 08/28/22  Yes [provider]   metFORMIN (GLUCOPHAGE) 500 MG tablet Take 1 tablet (500 mg total) by mouth 2 (two) times daily. Patient taking differently: Take 500 mg by mouth daily. 12/14/17  Yes Linwood Dibbles, MD  mometasone (ELOCON) 0.1 % ointment 1 application to left ear once daily. 12/17/22  Yes Kozlow, Alvira Philips, MD  omeprazole (PRILOSEC) 20 MG capsule Take 20 mg by mouth daily.   Yes [provider]  Pirfenidone 267 MG TABS Take 3 tablets (801 mg total) by mouth with breakfast, with lunch, and with evening meal. Month 2 and onwards Patient taking differently: Take 534 mg by mouth with breakfast, with lunch, and with evening meal. 03/13/22  Yes Olalere, Adewale A, MD  predniSONE (DELTASONE) 2.5 MG tablet Take 2.5 mg by mouth See admin instructions. Take 3 tablets once daily for 7 days, then 2 tablets once daily for 14 days, then 1 tablet once daily for 7 days.   Yes [provider]  riTUXimab (RITUXAN IV) Inject 1 Dose into the vein every 6 (six) months. Unknown dosage   Yes [provider]  spironolactone (ALDACTONE) 50 MG tablet Take 50 mg by mouth daily. 05/06/22 05/06/23 Yes [provider]  predniSONE (DELTASONE) 10 MG tablet 40 mg daily x 2 days, then 20mg  daily x 2 days, then 10mg  daily x 2 days, then 5mg  daily x 2 days and stop Patient not taking: Reported on 12/19/2022 11/07/22   Kalman Shan, MD  Allergies    Pork-derived products    Review of Systems   Review of Systems  Respiratory:  Positive for shortness of breath.   Review of systems completed and notable as per HPI.  ROS otherwise negative.   Physical Exam Updated Vital Signs BP 117/84   Pulse (!) 111   Temp 98.1 F (36.7 C) (Oral)   Resp (!) 21   Ht 5\' 8"  (1.727 m)   Wt 69 kg   SpO2 100%   BMI 23.13 kg/m  Physical Exam Vitals and nursing note reviewed.  Constitutional:      General: He is not in acute distress.    Appearance: He is well-developed.  HENT:     Head: Normocephalic and atraumatic.      Nose: Nose normal.     Mouth/Throat:     Mouth: Mucous membranes are dry.     Pharynx: Oropharynx is clear.  Eyes:     Conjunctiva/sclera: Conjunctivae normal.  Cardiovascular:     Rate and Rhythm: Regular rhythm. Tachycardia present.     Pulses: Normal pulses.     Heart sounds: Normal heart sounds. No murmur heard. Pulmonary:     Effort: Pulmonary effort is normal. Tachypnea present. No respiratory distress.     Breath sounds: Examination of the right-lower field reveals rhonchi. Wheezing and rhonchi present.  Abdominal:     General: There is no distension.     Palpations: Abdomen is soft.     Tenderness: There is no abdominal tenderness. There is no guarding or rebound.  Musculoskeletal:        General: No swelling.     Cervical back: Neck supple.     Right lower leg: No edema.     Left lower leg: No edema.  Skin:    General: Skin is warm and dry.     Capillary Refill: Capillary refill takes less than 2 seconds.  Neurological:     General: No focal deficit present.     Mental Status: He is alert and oriented to person, place, and time. Mental status is at baseline.  Psychiatric:        Mood and Affect: Mood normal.     ED Results / Procedures / Treatments   Labs (all labs ordered are listed, but only abnormal results are displayed) Labs Reviewed  CBC WITH DIFFERENTIAL/PLATELET - Abnormal; Notable for the following components:      Result Value   RBC 2.83 (*)    Hemoglobin 10.0 (*)    HCT 32.2 (*)    MCV 113.8 (*)    MCH 35.3 (*)    RDW 16.0 (*)    Lymphs Abs 0.1 (*)    Monocytes Absolute 0.0 (*)    All other components within normal limits  COMPREHENSIVE METABOLIC PANEL - Abnormal; Notable for the following components:   Potassium 3.0 (*)    Chloride 113 (*)    Glucose, Bld 134 (*)    Calcium 6.8 (*)    Albumin <1.5 (*)    Alkaline Phosphatase 159 (*)    All other components within normal limits  I-STAT VENOUS BLOOD GAS, ED - Abnormal; Notable for the  following components:   pCO2, Ven 43.5 (*)    pO2, Ven 77 (*)    Potassium 5.3 (*)    Calcium, Ion 1.00 (*)    HCT 32.0 (*)    Hemoglobin 10.9 (*)    All other components within normal limits  I-STAT CG4 LACTIC ACID, ED -  Abnormal; Notable for the following components:   Lactic Acid, Venous 2.6 (*)    All other components within normal limits  RESP PANEL BY RT-PCR (RSV, FLU A&B, COVID)  [id number]  CULTURE, BLOOD (ROUTINE X 2)  CULTURE, BLOOD (ROUTINE X 2)  LIPASE, BLOOD  URINALYSIS, ROUTINE W REFLEX MICROSCOPIC  COMPREHENSIVE METABOLIC PANEL  TROPONIN I (HIGH SENSITIVITY)  TROPONIN I (HIGH SENSITIVITY)    EKG EKG Interpretation Date/Time:  Thursday December 19 2022 15:40:33 EST Ventricular Rate:  143 PR Interval:  123 QRS Duration:  89 QT Interval:  312 QTC Calculation: 482 R Axis:   -35  Text Interpretation: Sinus tachycardia Left axis deviation Consider anterior infarct Confirmed by Fulton Reek [phone]) on 12/19/2022 3:57:31 PM  Radiology DG Chest Port 1 View  Result Date: 12/19/2022 CLINICAL DATA:  Shortness of breath. Multiple episodes of vomiting. History of pulmonary fibrosis. EXAM: PORTABLE CHEST 1 VIEW COMPARISON:  Chest x-ray 05/11/2022. FINDINGS: Underinflation. Again areas of interstitial septal thickening identified particularly right upper lung and left lung base. There is more opacity than on prior. This could relate to the poor inflation although superimposed infiltrates not excluded. Recommend close follow-up. No pneumothorax or effusion. Normal cardiopericardial silhouette when adjusting for technique. Overlapping cardiac leads. Air-fluid level along the stomach beneath the left hemidiaphragm. Loop recorder. IMPRESSION: Underinflation. Again areas of interstitial septal thickening identified particularly right upper lung and left lung base. There is more opacity than on prior. This could relate to the poor inflation although superimposed infiltrates not excluded.  Recommend close follow-up. Electronically Signed   By: Karen Kays M.D.   On: 12/19/2022 16:31    Procedures Procedures    Medications Ordered in ED Medications  ipratropium-albuterol (DUONEB) 0.5-2.5 (3) MG/3ML nebulizer solution 3 mL (3 mLs Nebulization Given 12/19/22 1817)  potassium chloride SA (KLOR-CON M) CR tablet 40 mEq (has no administration in time range)  lactated ringers bolus 1,000 mL (0 mLs Intravenous Stopped 12/19/22 1740)  cefTRIAXone (ROCEPHIN) 1 g in sodium chloride 0.9 % 100 mL IVPB (0 g Intravenous Stopped 12/19/22 1744)  azithromycin (ZITHROMAX) 500 mg in sodium chloride 0.9 % 250 mL IVPB (500 mg Intravenous New Bag/Given 12/19/22 1723)  magnesium sulfate IVPB 2 g 50 mL (2 g Intravenous New Bag/Given 12/19/22 1750)  methylPREDNISolone sodium succinate (SOLU-MEDROL) 125 mg/2 mL injection 125 mg (125 mg Intravenous Given 12/19/22 1739)    ED Course/ Medical Decision Making/ A&P                                 Medical Decision Making Amount and/or Complexity of Data Reviewed Labs: ordered. Radiology: ordered.  Risk Prescription drug management. Decision regarding hospitalization.   Medical Decision Making:   Richard Davenport is a 62 y.o. male who presented to the ED today with emesis, shortness of breath.  Vital signs reviewed notable for tachycardia, tachypnea.  On exam he is on his baseline ox requirement.  He does have significant wheezing as well as some Rales/rhonchi in the right lower lobe.  No fevers, does not appear septic.  His abdominal exam is completely benign, no pain or tenderness or distention.  He is having regular bowel moods have low suspicion for obstruction.  Given benign exam I do not think he needs abdominal imaging at this time.  No chest pain, EKG has some artifact but no clear ischemia.  He is in sinus tachycardia.  He appears dry.  Will treat  with breathing treatments, fluids and obtain workup.   Patient placed on continuous vitals and  telemetry monitoring while in ED which was reviewed periodically.  Reviewed and confirmed nursing documentation for past medical history, family history, social history.  Reassessment and Plan:   On reassessment breathing improved with steroids, nebs.  His chest x-ray is concerning for pneumonia as is his exam, blood cultures and antibiotics ordered.  He has mild hypokalemia and mild anemia.  Lactic acid slightly elevated.  pH is okay without hypercapnia.  I think he needs admission for treatment of heart for exacerbation and pneumonia.  Discussed with hospitalist and admitted.   Patient's presentation is most consistent with acute complicated illness / injury requiring diagnostic workup.           Final Clinical Impression(s) / ED Diagnoses Final diagnoses:  COPD exacerbation [facility])    Rx / DC Orders ED Discharge Orders     None         Laurence Spates, MD 12/19/22 1851

## 2022-12-19 NOTE — H&P (Signed)
History and Physical    Patient: Joevany Malon ZOX:[id number] DOB: [birth year] DOA: 12/19/2022 DOS: the patient was seen and examined on 12/19/2022 PCP: Kalman Shan, MD  Patient coming from: Home  Chief Complaint:  Chief Complaint  Patient presents with   Shortness of Breath   Vomiting   HPI: Hermilo Kyle is a 62 y.o. male with medical history significant of non-insulin-dependent diabetes, COPD, idiopathic pulmonary fibrosis, history of CVA, history of aneurysm, history of asthma, who presents to the ER from home with vomiting and shortness of breath.  Patient apparently had 4 episode of emesis at home.  He has not been able to eat or drink or take her medicines today.  He also feels some malaise and shortness of breath.  EMS gave him some DuoNeb's prior to coming in.  At baseline he is on 45 L of oxygen and follows up with pulmonary.  In the ER patient was treated with nebulizer and some treatment.  Patient continues to have significant wheezing and shortness of breath.  Oxygen requirement appears to have increased.  At this point he is being admitted to the hospital with acute on chronic hypoxic respiratory failure secondary to COPD exacerbation.  Review of Systems: As mentioned in the history of present illness. All other systems reviewed and are negative. Past Medical History:  Diagnosis Date   Asthma    Diabetes mellitus without complication (HCC)    Headache    Idiopathic pulmonary fibrosis (HCC)    Stroke (HCC)    Subarachnoid hemorrhage (HCC)    Past Surgical History:  Procedure Laterality Date   ANEURYSM COILING     for bleed   LOOP RECORDER INSERTION N/A 04/10/2018   Procedure: LOOP RECORDER INSERTION;  Surgeon: Duke Salvia, MD;  Location: [facility] INVASIVE CV LAB;  Service: Cardiovascular;  Laterality: N/A;   RADIOLOGY WITH ANESTHESIA N/A 10/09/2012   Procedure: RADIOLOGY WITH ANESTHESIA;  Surgeon: Lisbeth Renshaw, MD;  Location: [facility] OR;  Service: Radiology;  Laterality:  N/A;   RADIOLOGY WITH ANESTHESIA N/A 05/27/2013   Procedure: RADIOLOGY WITH ANESTHESIA;  Surgeon: Lisbeth Renshaw, MD;  Location: [facility] OR;  Service: Radiology;  Laterality: N/A;   Social History:  reports that he has never smoked. He has been exposed to tobacco smoke. He has never used smokeless tobacco. He reports that he does not drink alcohol and does not use drugs.  Allergies  Allergen Reactions   Pork-Derived Products Other (See Comments)    Patient is Muslim and PREFERS TO NOT TAKE ANY PORK OR MEAT PRODUCTS (only fish)    Family History  Problem Relation Age of Onset   Hypertension Mother    Hypertension Father     Prior to Admission medications   Medication Sig Start Date End Date Taking? Authorizing Provider  albuterol (PROVENTIL) (2.5 MG/3ML) 0.083% nebulizer solution Take 3 mLs (2.5 mg total) by nebulization every 4 (four) hours as needed for wheezing or shortness of breath. 12/17/22  Yes Kozlow, Alvira Philips, MD  albuterol (VENTOLIN HFA) 108 (90 Base) MCG/ACT inhaler Inhale 2 puffs into the lungs every 6 (six) hours as needed for wheezing or shortness of breath. 02/18/22  Yes Olalere, Adewale A, MD  budesonide (PULMICORT) 0.5 MG/2ML nebulizer solution Take 2 mLs (0.5 mg total) by nebulization in the morning, at noon, in the evening, and at bedtime. 12/17/22  Yes Kozlow, Alvira Philips, MD  dextromethorphan (DELSYM) 30 MG/5ML liquid Take 30 mg by mouth 2 (two) times daily as needed for cough.  Yes [provider]  furosemide (LASIX) 40 MG tablet Take 0.5 tablets (20 mg total) by mouth daily. 05/20/22 05/20/23 Yes Arnetha Courser, MD  gabapentin (NEURONTIN) 300 MG capsule Take 300 mg by mouth 3 (three) times daily. 08/28/22  Yes [provider]  metFORMIN (GLUCOPHAGE) 500 MG tablet Take 1 tablet (500 mg total) by mouth 2 (two) times daily. Patient taking differently: Take 500 mg by mouth daily. 12/14/17  Yes Linwood Dibbles, MD  mometasone (ELOCON) 0.1 % ointment 1 application to left ear  once daily. 12/17/22  Yes Kozlow, Alvira Philips, MD  omeprazole (PRILOSEC) 20 MG capsule Take 20 mg by mouth daily.   Yes [provider]  Pirfenidone 267 MG TABS Take 3 tablets (801 mg total) by mouth with breakfast, with lunch, and with evening meal. Month 2 and onwards Patient taking differently: Take 534 mg by mouth with breakfast, with lunch, and with evening meal. 03/13/22  Yes Olalere, Adewale A, MD  predniSONE (DELTASONE) 2.5 MG tablet Take 2.5 mg by mouth See admin instructions. Take 3 tablets once daily for 7 days, then 2 tablets once daily for 14 days, then 1 tablet once daily for 7 days.   Yes [provider]  riTUXimab (RITUXAN IV) Inject 1 Dose into the vein every 6 (six) months. Unknown dosage   Yes [provider]  spironolactone (ALDACTONE) 50 MG tablet Take 50 mg by mouth daily. 05/06/22 05/06/23 Yes [provider]  predniSONE (DELTASONE) 10 MG tablet 40 mg daily x 2 days, then 20mg  daily x 2 days, then 10mg  daily x 2 days, then 5mg  daily x 2 days and stop Patient not taking: Reported on 12/19/2022 11/07/22   Kalman Shan, MD    Physical Exam: Vitals:   12/19/22 1545 12/19/22 1615 12/19/22 1715 12/19/22 2010  BP:  109/81 117/84 107/72  Pulse:  (!) 121 (!) 111 (!) 108  Resp:  (!) 38 (!) 21 (!) 22  Temp:      TempSrc:      SpO2:  99% 100% 100%  Weight: 69 kg     Height: 5\' 8"  (1.727 m)      Constitutional: NAD, calm, comfortable Eyes: PERRL, lids and conjunctivae normal ENMT: Mucous membranes are moist. Posterior pharynx clear of any exudate or lesions.Normal dentition.  Neck: normal, supple, no masses, no thyromegaly Respiratory: Decreased air entry bilaterally with moderate expiratory wheezing. No accessory muscle use.  Cardiovascular: Sinus tachycardia, no murmurs / rubs / gallops. No extremity edema. 2+ pedal pulses. No carotid bruits.  Abdomen: no tenderness, no masses palpated. No hepatosplenomegaly. Bowel sounds positive.   Musculoskeletal: Good range of motion, no joint swelling or tenderness, Skin: no rashes, lesions, ulcers. No induration Neurologic: CN 2-12 grossly intact. Sensation intact, DTR normal. Strength 5/5 in all 4.  Psychiatric: Normal judgment and insight. Alert and oriented x 3. Normal mood  Data Reviewed:  Sodium 142 potassium 3.0 chloride 113 CO2 22 glucose 134, calcium 6.8.  Alkaline phosphatase 159 albumin less than 1.5.  Lactic acid 2.6 hemoglobin 10.0.  Acute viral screen negative for influenza and COVID-19.  Chest x-ray showed anti-inflammation.  No obvious infiltrates but features of interstitial lung disease.  Assessment and Plan:  #1 acute on chronic respiratory failure with hypoxia: Secondary to COPD exacerbation and ILD.  Patient will be admitted.  Follow the gold protocol for COPD.  Steroids nebulizers and antibiotics.  Continue oxygen.  #2 nausea with vomiting: No obvious cause.  Probably well illness.  Symptomatic management.  Hydrate.  #3 interstitial lung disease: Continue outpatient follow-up with pulmonary.  Continue treatment per pulmonary.  #4 anemia of chronic disease: Hemoglobin at 10 g.  Continue to monitor  #5 severe protein calorie malnutrition: Albumin less than 1.5.  Nutrition consult.  #6 hypokalemia: Replete potassium.  #7 hyperlipidemia: Continue home regimen.  #8 non-insulin-dependent diabetes: Blood sugar likely to get worse.  Initiate sliding scale insulin.    Advance Care Planning:   Code Status: Full Code   Consults: None  Family Communication: No family at bedside  Severity of Illness: The appropriate patient status for this patient is INPATIENT. Inpatient status is judged to be reasonable and necessary in order to provide the required intensity of service to ensure the patient's safety. The patient's presenting symptoms, physical exam findings, and initial radiographic and laboratory data in the context of their chronic comorbidities is felt to  place them at high risk for further clinical deterioration. Furthermore, it is not anticipated that the patient will be medically stable for discharge from the hospital within 2 midnights of admission.   * I certify that at the point of admission it is my clinical judgment that the patient will require inpatient hospital care spanning beyond 2 midnights from the point of admission due to high intensity of service, high risk for further deterioration and high frequency of surveillance required.*  AuthorLonia Blood, MD 12/19/2022 8:41 PM  For on call review [email or web address].

## 2022-12-19 NOTE — ED Notes (Signed)
Labs/lactic sent. Pulled cultures. Waiting on orders

## 2022-12-20 ENCOUNTER — Other Ambulatory Visit (HOSPITAL_COMMUNITY): Payer: Self-pay

## 2022-12-20 DIAGNOSIS — E43 Unspecified severe protein-calorie malnutrition: Secondary | ICD-10-CM | POA: Insufficient documentation

## 2022-12-20 DIAGNOSIS — J9602 Acute respiratory failure with hypercapnia: Secondary | ICD-10-CM | POA: Diagnosis not present

## 2022-12-20 LAB — CBC
HCT: 26.2 % — ABNORMAL LOW (ref 39.0–52.0)
Hemoglobin: 8.1 g/dL — ABNORMAL LOW (ref 13.0–17.0)
MCH: 34.6 pg — ABNORMAL HIGH (ref 26.0–34.0)
MCHC: 30.9 g/dL (ref 30.0–36.0)
MCV: 112 fL — ABNORMAL HIGH (ref 80.0–100.0)
Platelets: 108 10*3/uL — ABNORMAL LOW (ref 150–400)
RBC: 2.34 MIL/uL — ABNORMAL LOW (ref 4.22–5.81)
RDW: 15.8 % — ABNORMAL HIGH (ref 11.5–15.5)
WBC: 4.1 10*3/uL (ref 4.0–10.5)
nRBC: 0 % (ref 0.0–0.2)

## 2022-12-20 LAB — COMPREHENSIVE METABOLIC PANEL
ALT: 18 U/L (ref 0–44)
AST: 32 U/L (ref 15–41)
Albumin: <1.5 g/dL — ABNORMAL LOW (ref 3.5–5.0)
Alkaline Phosphatase: 167 U/L — ABNORMAL HIGH (ref 38–126)
Anion gap: 4 — ABNORMAL LOW (ref 5–15)
BUN: 16 mg/dL (ref 6–20)
CO2: 26 mmol/L (ref 22–32)
Calcium: 8 mg/dL — ABNORMAL LOW (ref 8.9–10.3)
Chloride: 105 mmol/L (ref 98–111)
Creatinine, Ser: 0.87 mg/dL (ref 0.61–1.24)
GFR, Estimated: >60 mL/min (ref >60)
Glucose, Bld: 172 mg/dL — ABNORMAL HIGH (ref 70–99)
Potassium: 4.5 mmol/L (ref 3.5–5.1)
Sodium: 135 mmol/L (ref 135–145)
Total Bilirubin: 0.7 mg/dL (ref <1.2)
Total Protein: 8.6 g/dL — ABNORMAL HIGH (ref 6.5–8.1)

## 2022-12-20 LAB — GLUCOSE, CAPILLARY
Glucose-Capillary: 137 mg/dL — ABNORMAL HIGH (ref 70–99)
Glucose-Capillary: 108 mg/dL — ABNORMAL HIGH (ref 70–99)

## 2022-12-20 LAB — HIV ANTIBODY (ROUTINE TESTING W REFLEX): HIV Screen 4th Generation wRfx: NONREACTIVE

## 2022-12-20 LAB — HEMOGLOBIN A1C
Hgb A1c MFr Bld: 4.9 % (ref 4.8–5.6)
Mean Plasma Glucose: 93.93 mg/dL

## 2022-12-20 MED ORDER — BUDESONIDE 0.5 MG/2ML IN SUSP
0.5000 mg | Freq: Four times a day (QID) | RESPIRATORY_TRACT | 5 refills | Status: DC
  Filled 2022-12-20: qty 120, 15d supply, fill #0

## 2022-12-20 MED ORDER — CEFDINIR 300 MG PO CAPS
300.0000 mg | ORAL_CAPSULE | Freq: Two times a day (BID) | ORAL | 0 refills | Status: AC
Start: 2022-12-20 — End: 2022-12-25
  Filled 2022-12-20: qty 10, 5d supply, fill #0

## 2022-12-20 MED ORDER — LACTATED RINGERS IV BOLUS
1000.0000 mL | Freq: Once | INTRAVENOUS | Status: AC
  Administered 2022-12-20: 1000 mL via INTRAVENOUS

## 2022-12-20 MED ORDER — ENSURE ENLIVE PO LIQD: 237.0000 mL | Freq: Two times a day (BID) | ORAL | Status: DC

## 2022-12-20 MED ORDER — PREDNISONE 10 MG PO TABS
ORAL_TABLET | ORAL | 0 refills | Status: AC
  Filled 2022-12-20: qty 30, 12d supply, fill #0

## 2022-12-20 MED ORDER — LACTATED RINGERS IV SOLN: INTRAVENOUS | Status: DC

## 2022-12-20 MED ORDER — SODIUM CHLORIDE 0.9 % IV SOLN
1.0000 g | INTRAVENOUS | Status: DC
  Administered 2022-12-20: 1 g via INTRAVENOUS

## 2022-12-20 MED ORDER — ALBUTEROL SULFATE (2.5 MG/3ML) 0.083% IN NEBU
2.5000 mg | INHALATION_SOLUTION | RESPIRATORY_TRACT | 0 refills | Status: DC | PRN
  Filled 2022-12-20: qty 180, 10d supply, fill #0
  Filled 2022-12-20: qty 150, 9d supply, fill #0

## 2022-12-20 MED ORDER — ADULT MULTIVITAMIN W/MINERALS CH
1.0000 | ORAL_TABLET | Freq: Every day | ORAL | Status: DC
  Administered 2022-12-20: 1 via ORAL
  Filled 2022-12-20: qty 1

## 2022-12-20 NOTE — Discharge Summary (Signed)
Physician Discharge Summary  Richard Davenport QVZ:[id number] DOB: [birth year] DOA: 12/19/2022  PCP: Kalman Shan, MD  Admit date: 12/19/2022 Discharge date: 12/20/2022  Admitted From: home Disposition:  home  Recommendations for Outpatient Follow-up:  Follow up with PCP in 1-2 weeks  Home Health: none Equipment/Devices: none  Discharge Condition: stable CODE STATUS: Full code Diet Orders (From admission, onward)     Start     Ordered   12/19/22 2213  Diet regular Room service appropriate? Yes; Fluid consistency: Thin  Diet effective now       Question Answer Comment  Room service appropriate? Yes   Fluid consistency: Thin      12/19/22 2212            HPI: Per admitting MD, Richard Davenport is a 62 y.o. male with medical history significant of non-insulin-dependent diabetes, COPD, idiopathic pulmonary fibrosis, history of CVA, history of aneurysm, history of asthma, who presents to the ER from home with vomiting and shortness of breath.  Patient apparently had 4 episode of emesis at home.  He has not been able to eat or drink or take her medicines today.  He also feels some malaise and shortness of breath.  EMS gave him some DuoNeb's prior to coming in.  At baseline he is on 45 L of oxygen and follows up with pulmonary.  In the ER patient was treated with nebulizer and some treatment.  Patient continues to have significant wheezing and shortness of breath.  Oxygen requirement appears to have increased.   Hospital Course / Discharge diagnoses: Principal Problem:   Acute respiratory failure with hypercapnia (HCC) Active Problems:   Type 2 diabetes mellitus with hyperglycemia (HCC)   Idiopathic pulmonary fibrosis (HCC)   Cerebral aneurysm, nonruptured   Hyperlipidemia   Obesity   Nausea & vomiting   Protein-calorie malnutrition, severe   Principal problem Acute on chronic hypoxic respiratory failure in the setting of underlying ILD and possible CAP -patient was admitted to  the hospital, started on nebulizers, steroids, antibiotics.  At baseline he is on 4 to 5 L of oxygen at rest and up to 7 to 8 L depending on activities.  With treatment patient has improved, was able to work with therapy, and feels back to baseline and is able to ambulate.  Will be discharged home in stable condition and will complete a regimen of antibiotics, prednisone taper and his nebulizers will be filled at our pharmacy prior to discharge since he has not started them yet.   Active problems ILD-outpatient follow-up with pulmonary Nausea, vomiting-resolved.  No abdominal pain, no diarrhea.  LFTs are normal Hypokalemia-potassium replaced and normalized Severe protein calorie malnutrition-RD consult, continue nutritional supplements at home Anemia of chronic disease -hemoglobin overall stable, no bleeding Thrombocytopenia-acute on chronic HLD - continue home regimen DM2 -A1c in the 4 range, probably resolved since he is malnourished  Sepsis ruled out   Discharge Instructions   Allergies as of 12/20/2022       Reactions   Pork-derived Products Other (See Comments)   Patient is Muslim and PREFERS TO NOT TAKE ANY PORK OR MEAT PRODUCTS (only fish)        Medication List     TAKE these medications    albuterol 108 (90 Base) MCG/ACT inhaler Commonly known as: VENTOLIN HFA Inhale 2 puffs into the lungs every 6 (six) hours as needed for wheezing or shortness of breath.   albuterol (2.5 MG/3ML) 0.083% nebulizer solution Commonly known as: PROVENTIL Take 3  mLs (2.5 mg total) by nebulization every 4 (four) hours as needed for wheezing or shortness of breath.   budesonide 0.5 MG/2ML nebulizer solution Commonly known as: Pulmicort Take 2 mLs (0.5 mg total) by nebulization in the morning, at noon, in the evening, and at bedtime.   cefdinir 300 MG capsule Commonly known as: OMNICEF Take 1 capsule (300 mg total) by mouth 2 (two) times daily for 5 days.   Delsym 30 MG/5ML  liquid Generic drug: dextromethorphan Take 30 mg by mouth 2 (two) times daily as needed for cough.   furosemide 40 MG tablet Commonly known as: LASIX Take 0.5 tablets (20 mg total) by mouth daily.   gabapentin 300 MG capsule Commonly known as: NEURONTIN Take 300 mg by mouth 3 (three) times daily.   metFORMIN 500 MG tablet Commonly known as: GLUCOPHAGE Take 1 tablet (500 mg total) by mouth 2 (two) times daily. What changed: when to take this   mometasone 0.1 % ointment Commonly known as: ELOCON 1 application to left ear once daily.   omeprazole 20 MG capsule Commonly known as: PRILOSEC Take 20 mg by mouth daily.   Pirfenidone 267 MG Tabs Take 3 tablets (801 mg total) by mouth with breakfast, with lunch, and with evening meal. Month 2 and onwards What changed:  how much to take additional instructions   predniSONE 2.5 MG tablet Commonly known as: DELTASONE Take 2.5 mg by mouth See admin instructions. Take 3 tablets once daily for 7 days, then 2 tablets once daily for 14 days, then 1 tablet once daily for 7 days. What changed: Another medication with the same name was changed. Make sure you understand how and when to take each.   predniSONE 10 MG tablet Commonly known as: DELTASONE Take 4 tablets (40 mg total) by mouth daily for 3 days, THEN 3 tablets (30 mg total) daily for 3 days, THEN 2 tablets (20 mg total) daily for 3 days, THEN 1 tablet (10 mg total) daily for 3 days. Start taking on: December 20, 2022 What changed: See the new instructions.   RITUXAN IV Inject 1 Dose into the vein every 6 (six) months. Unknown dosage   spironolactone 50 MG tablet Commonly known as: ALDACTONE Take 50 mg by mouth daily.        Consultations: none  Procedures/Studies:  DG Chest Port 1 View  Result Date: 12/19/2022 CLINICAL DATA:  Shortness of breath. Multiple episodes of vomiting. History of pulmonary fibrosis. EXAM: PORTABLE CHEST 1 VIEW COMPARISON:  Chest x-ray  05/11/2022. FINDINGS: Underinflation. Again areas of interstitial septal thickening identified particularly right upper lung and left lung base. There is more opacity than on prior. This could relate to the poor inflation although superimposed infiltrates not excluded. Recommend close follow-up. No pneumothorax or effusion. Normal cardiopericardial silhouette when adjusting for technique. Overlapping cardiac leads. Air-fluid level along the stomach beneath the left hemidiaphragm. Loop recorder. IMPRESSION: Underinflation. Again areas of interstitial septal thickening identified particularly right upper lung and left lung base. There is more opacity than on prior. This could relate to the poor inflation although superimposed infiltrates not excluded. Recommend close follow-up. Electronically Signed   By: Karen Kays M.D.   On: 12/19/2022 16:31     Subjective: - no chest pain, shortness of breath, no abdominal pain, nausea or vomiting.   Discharge Exam: BP 106/73 (BP Location: Left Arm)   Pulse 84   Temp 97.6 F (36.4 C) (Oral)   Resp 16   Ht 5\' 8"  (  1.727 m)   Wt 69 kg   SpO2 98%   BMI 23.13 kg/m   General: Pt is alert, awake, not in acute distress Cardiovascular: RRR, S1/S2 +, no rubs, no gallops Respiratory: CTA bilaterally, no wheezing, no rhonchi Abdominal: Soft, NT, ND, bowel sounds + Extremities: no edema, no cyanosis    The results of significant diagnostics from this hospitalization (including imaging, microbiology, ancillary and laboratory) are listed below for reference.     Microbiology: Recent Results (from the past 240 hour(s))  Resp panel by RT-PCR (RSV, Flu A&B, Covid) Anterior Nasal Swab     Status: None   Collection Time: 12/19/22  4:04 PM   Specimen: Anterior Nasal Swab  Result Value Ref Range Status   SARS Coronavirus 2 by RT PCR NEGATIVE NEGATIVE Final   Influenza A by PCR NEGATIVE NEGATIVE Final   Influenza B by PCR NEGATIVE NEGATIVE Final    Comment:  (NOTE) The Xpert Xpress SARS-CoV-2/FLU/RSV plus assay is intended as an aid in the diagnosis of influenza from Nasopharyngeal swab specimens and should not be used as a sole basis for treatment. Nasal washings and aspirates are unacceptable for Xpert Xpress SARS-CoV-2/FLU/RSV testing.  Fact Sheet for Patients: BloggerCourse.com  Fact Sheet for Healthcare Providers: SeriousBroker.it  This test is not yet approved or cleared by the [place] FDA and has been authorized for detection and/or diagnosis of SARS-CoV-2 by FDA under an Emergency Use Authorization (EUA). This EUA will remain in effect (meaning this test can be used) for the duration of the COVID-19 declaration under Section 564(b)(1) of the Act, 21 U.S.C. section 360bbb-3(b)(1), unless the authorization is terminated or revoked.     Resp Syncytial Virus by PCR NEGATIVE NEGATIVE Final    Comment: (NOTE) Fact Sheet for Patients: BloggerCourse.com  Fact Sheet for Healthcare Providers: SeriousBroker.it  This test is not yet approved or cleared by the [place] FDA and has been authorized for detection and/or diagnosis of SARS-CoV-2 by FDA under an Emergency Use Authorization (EUA). This EUA will remain in effect (meaning this test can be used) for the duration of the COVID-19 declaration under Section 564(b)(1) of the Act, 21 U.S.C. section 360bbb-3(b)(1), unless the authorization is terminated or revoked.  Performed at [facility], [address] [place], [place] [address]   Blood culture (routine x 2)     Status: None (Preliminary result)   Collection Time: 12/19/22  4:50 PM   Specimen: BLOOD  Result Value Ref Range Status   Specimen Description BLOOD SITE NOT SPECIFIED  Final   Special Requests   Final    BOTTLES DRAWN AEROBIC AND ANAEROBIC Blood Culture results may not be optimal due to an inadequate  volume of blood received in culture bottles   Culture   Final    NO GROWTH < 24 HOURS Performed at [facility], [address], [place], [place] [address]    Report Status PENDING  Incomplete  Blood culture (routine x 2)     Status: None (Preliminary result)   Collection Time: 12/19/22  4:51 PM   Specimen: BLOOD  Result Value Ref Range Status   Specimen Description BLOOD SITE NOT SPECIFIED  Final   Special Requests   Final    BOTTLES DRAWN AEROBIC AND ANAEROBIC Blood Culture results may not be optimal due to an inadequate volume of blood received in culture bottles   Culture   Final    NO GROWTH < 24 HOURS Performed at [facility], [address], [place], [place] [address]    Report Status PENDING  Incomplete     Labs: Basic Metabolic Panel: Recent Labs  Lab 12/19/22 1609 12/19/22 1632 12/19/22 2038 12/20/22 0543  NA 143 142 140 135  K 5.3* 3.0* 4.2 4.5  CL  --  113* 104 105  CO2  --  22 25 26   GLUCOSE  --  134* 176* 172*  BUN  --  11 13 16   CREATININE  --  0.80 1.01 0.87  CALCIUM  --  6.8* 8.4* 8.0*   Liver Function Tests: Recent Labs  Lab 12/17/22 1446 12/19/22 1632 12/19/22 2038 12/20/22 0543  AST 44* 34 42* 32  ALT 21 16 21 18   ALKPHOS 316* 159* 191* 167*  BILITOT 0.8 0.9 1.1 0.7  PROT 8.5 7.2 9.1* 8.6*  ALBUMIN 2.1* <1.5* 1.5* <1.5*   CBC: Recent Labs  Lab 12/19/22 1604 12/19/22 1609 12/20/22 0543  WBC 7.1  --  4.1  NEUTROABS 6.7  --   --   HGB 10.0* 10.9* 8.1*  HCT 32.2* 32.0* 26.2*  MCV 113.8*  --  112.0*  PLT 172  --  108*   CBG: Recent Labs  Lab 12/19/22 2138 12/20/22 0742 12/20/22 1149  GLUCAP 204* 137* 108*   Hgb A1c Recent Labs    12/20/22 0543  HGBA1C 4.9   Lipid Profile No results for input(s): "CHOL", "HDL", "LDLCALC", "TRIG", "CHOLHDL", "LDLDIRECT" in the last 72 hours. Thyroid function studies No results for input(s): "TSH", "T4TOTAL", "T3FREE", "THYROIDAB" in the last 72 hours.  Invalid input(s):  "FREET3" Urinalysis    Component Value Date/Time   COLORURINE AMBER (A) 12/19/2022 1604   APPEARANCEUR CLEAR 12/19/2022 1604   LABSPEC 1.021 12/19/2022 1604   PHURINE 6.0 12/19/2022 1604   GLUCOSEU NEGATIVE 12/19/2022 1604   HGBUR SMALL (A) 12/19/2022 1604   BILIRUBINUR NEGATIVE 12/19/2022 1604   KETONESUR NEGATIVE 12/19/2022 1604   PROTEINUR NEGATIVE 12/19/2022 1604   UROBILINOGEN 0.2 05/27/2014 1230   NITRITE NEGATIVE 12/19/2022 1604   LEUKOCYTESUR NEGATIVE 12/19/2022 1604    FURTHER DISCHARGE INSTRUCTIONS:   Get Medicines reviewed and adjusted: Please take all your medications with you for your next visit with your Primary MD   Laboratory/radiological data: Please request your Primary MD to go over all hospital tests and procedure/radiological results at the follow up, please ask your Primary MD to get all Hospital records sent to his/her office.   In some cases, they will be blood work, cultures and biopsy results pending at the time of your discharge. Please request that your primary care M.D. goes through all the records of your hospital data and follows up on these results.   Also Note the following: If you experience worsening of your admission symptoms, develop shortness of breath, life threatening emergency, suicidal or homicidal thoughts you must seek medical attention immediately by calling 911 or calling your MD immediately  if symptoms less severe.   You must read complete instructions/literature along with all the possible adverse reactions/side effects for all the Medicines you take and that have been prescribed to you. Take any new Medicines after you have completely understood and accpet all the possible adverse reactions/side effects.    Do not drive when taking Pain medications or sleeping medications (Benzodaizepines)   Do not take more than prescribed Pain, Sleep and Anxiety Medications. It is not advisable to combine anxiety,sleep and pain medications without  talking with your primary care practitioner   Special  Instructions: If you have smoked or chewed Tobacco  in the last 2 yrs please stop smoking, stop any regular Alcohol  and or any Recreational drug use.   Wear Seat belts while driving.   Please note: You were cared for by a hospitalist during your hospital stay. Once you are discharged, your primary care physician will handle any further medical issues. Please note that NO REFILLS for any discharge medications will be authorized once you are discharged, as it is imperative that you return to your primary care physician (or establish a relationship with a primary care physician if you do not have one) for your post hospital discharge needs so that they can reassess your need for medications and monitor your lab values.  Time coordinating discharge: 35 minutes  SIGNED:  Pamella Pert, MD, PhD 12/20/2022, 1:32 PM

## 2022-12-20 NOTE — Evaluation (Signed)
Physical Therapy Evaluation and Discharge Patient Details Name: Richard Davenport MRN: [id number] DOB: 01/25/1960 Today's Date: 12/20/2022  History of Present Illness  62 y.o. male who presented 11/28 from home with vomiting and shortness of breath. With medical history significant of non-insulin-dependent diabetes, COPD, idiopathic pulmonary fibrosis, history of CVA, history of aneurysm, history of asthma.  Clinical Impression  Patient evaluated by Physical Therapy with no further acute PT needs identified. All education has been completed and the patient has no further questions. Ambulates 225 feet without assistive device, supervision for safety mild instability but self-corrects. Increased supplemental O2 to 6L, SpO2 dropped to 78% while ambulating but raises within 3 min to low 90s with seated rest break. On 5L at rest. Pt states he does not typically walk as far as he demonstrated today. Educated on awareness, monitoring O2, pacing, and energy conservation techniques. Family present and supportive. Would benefit from pulmonary rehab program however family reports issue with new insurance coverage. See below for any follow-up Physical Therapy or equipment needs. PT is signing off. Thank you for this referral.         If plan is discharge home, recommend the following: Assistance with cooking/housework   Can travel by private vehicle        Equipment Recommendations Other (comment) (Family requests SCDs)  Recommendations for Other Services       Functional Status Assessment Patient has not had a recent decline in their functional status     Precautions / Restrictions Precautions Precautions: Fall Precaution Comments: Desaturates with activity Restrictions Weight Bearing Restrictions: No      Mobility  Bed Mobility Overal bed mobility: Modified Independent                  Transfers Overall transfer level: Needs assistance Equipment used: None Transfers: Sit to/from  Stand, Bed to chair/wheelchair/BSC Sit to Stand: Supervision           General transfer comment: Supervision for safety, no physical assist, minor sway, able to stablize himself without AD. Declines RW    Ambulation/Gait Ambulation/Gait assistance: Supervision Gait Distance (Feet): 225 Feet Assistive device: None Gait Pattern/deviations: Step-through pattern, Decreased stride length, Drifts right/left Gait velocity: wnl Gait velocity interpretation: >2.62 ft/sec, indicative of community ambulatory   General Gait Details: Demonstrates minor drift towards Rt, Cues for awareness and corrective technique. This occurrs later in distance as he began to fatigue. Pt reports moderate SOB. 3/3 dyspnea towards end of bout with SpO2 78% on 6L supplemental O2. Seated rest increased to low 90s within 3 minutes. Educated on slower pacing, energy conservation, consider obtaining a rollator if desired at somepoint as this could help with mobility when he is out of his home.  Stairs            Wheelchair Mobility     Tilt Bed    Modified Rankin (Stroke Patients Only)       Balance Overall balance assessment: Mild deficits observed, not formally tested                                           Pertinent Vitals/Pain Pain Assessment Pain Assessment: No/denies pain    Home Living Family/patient expects to be discharged to:: Private residence Living Arrangements: Children;Spouse/significant other Available Help at Discharge: Family;Available 24 hours/day Type of Home: House Home Access: Stairs to enter Entrance Stairs-Rails: Left Entrance Stairs-Number of  Steps: 2   Home Layout: One level Home Equipment: None Additional Comments: Home O2    Prior Function Prior Level of Function : Independent/Modified Independent                     Extremity/Trunk Assessment   Upper Extremity Assessment Upper Extremity Assessment: Defer to OT evaluation    Lower  Extremity Assessment Lower Extremity Assessment: Generalized weakness    Cervical / Trunk Assessment Cervical / Trunk Assessment: Kyphotic  Communication   Communication Communication: No apparent difficulties  Cognition Arousal: Alert Behavior During Therapy: WFL for tasks assessed/performed Overall Cognitive Status: Within Functional Limits for tasks assessed                                          General Comments General comments (skin integrity, edema, etc.): Sats mid 90s on 5L at rest. increased O2 to 6L while ambulating, SpO2 dropped to 78 % but rises quickly with seated break. Pt educated on symptom awareness and monitoring with his home pulse ox.    Exercises     Assessment/Plan    PT Assessment All further PT needs can be met in the next venue of care  PT Problem List Decreased strength;Decreased activity tolerance;Decreased mobility;Cardiopulmonary status limiting activity;Decreased knowledge of use of DME       PT Treatment Interventions Gait training;Functional mobility training;Therapeutic activities;Balance training;Patient/family education    PT Goals (Current goals can be found in the Care Plan section)  Acute Rehab PT Goals Patient Stated Goal: go home PT Goal Formulation: All assessment and education complete, DC therapy    Frequency       Co-evaluation               AM-PAC PT "6 Clicks" Mobility  Outcome Measure Help needed turning from your back to your side while in a flat bed without using bedrails?: None Help needed moving from lying on your back to sitting on the side of a flat bed without using bedrails?: None Help needed moving to and from a bed to a chair (including a wheelchair)?: None Help needed standing up from a chair using your arms (e.g., wheelchair or bedside chair)?: None Help needed to walk in hospital room?: A Little Help needed climbing 3-5 steps with a railing? : A Little 6 Click Score: 22    End of  Session Equipment Utilized During Treatment: Oxygen Activity Tolerance: Patient tolerated treatment well Patient left: in bed;with call bell/phone within reach;with bed alarm set;with family/visitor present Nurse Communication: Mobility status PT Visit Diagnosis: Other abnormalities of gait and mobility (R26.89);Muscle weakness (generalized) (M62.81)    Time: 3664-4034 PT Time Calculation (min) (ACUTE ONLY): 19 min   Charges:   PT Evaluation $PT Eval Low Complexity: 1 Low   PT General Charges $$ ACUTE PT VISIT: 1 Visit         Kathlyn [place], PT, DPT [facility]  Rehabilitation Services Physical Therapist Office: [phone] Website: [email or web address]   Berton Mount 12/20/2022, 2:47 PM

## 2022-12-20 NOTE — Evaluation (Signed)
Occupational Therapy Evaluation Patient Details Name: Richard Davenport MRN: [id number] DOB: 07/11/1960 Today's Date: 12/20/2022   History of Present Illness 62 y.o. male with medical history significant of non-insulin-dependent diabetes, COPD, idiopathic pulmonary fibrosis, history of CVA, history of aneurysm, history of asthma, who presented 11/28 from home with vomiting and shortness of breath.   Clinical Impression   Patient admitted for the diagnosis above.  PTA he uses a concentrator for short trips and at night, and pushes a O2 tank for mobility in the home.  Daughter states the concentrator only goes to 5L, and with mobility he needs 7 to 8L.  Patient continues to complete his own ADL with increased time and effort, but is largely sedentary at home.  Patient and daughter endorse he is close to his baseline, and no significant OT needs exist in the acute setting. No post acute OT is anticipated.        If plan is discharge home, recommend the following: Assist for transportation;Assistance with cooking/housework    Functional Status Assessment  Patient has had a recent decline in their functional status and demonstrates the ability to make significant improvements in function in a reasonable and predictable amount of time.  Equipment Recommendations  None recommended by OT    Recommendations for Other Services       Precautions / Restrictions Precautions Precautions: Fall Precaution Comments: Desaturates with activity Restrictions Weight Bearing Restrictions: No      Mobility Bed Mobility Overal bed mobility: Modified Independent               Patient Response: Cooperative  Transfers Overall transfer level: Needs assistance Equipment used: None Transfers: Sit to/from Stand, Bed to chair/wheelchair/BSC Sit to Stand: Modified independent (Device/Increase time)     Step pivot transfers: Supervision     General transfer comment: patient able to push O2 tank       Balance Overall balance assessment: Mild deficits observed, not formally tested                                         ADL either performed or assessed with clinical judgement   ADL Overall ADL's : At baseline                                       General ADL Comments: Increased time and rest breaks     Vision Patient Visual Report: No change from baseline       Perception Perception: Within Functional Limits       Praxis Praxis: WFL       Pertinent Vitals/Pain Pain Assessment Pain Assessment: No/denies pain     Extremity/Trunk Assessment Upper Extremity Assessment Upper Extremity Assessment: Overall WFL for tasks assessed   Lower Extremity Assessment Lower Extremity Assessment: Defer to PT evaluation   Cervical / Trunk Assessment Cervical / Trunk Assessment: Kyphotic   Communication Communication Communication: No apparent difficulties   Cognition Arousal: Alert Behavior During Therapy: WFL for tasks assessed/performed Overall Cognitive Status: Within Functional Limits for tasks assessed  Home Living Family/patient expects to be discharged to:: Private residence Living Arrangements: Children;Spouse/significant other Available Help at Discharge: Family;Available 24 hours/day Type of Home: House Home Access: Stairs to enter [employer] of Steps: 2 Entrance Stairs-Rails: Left Home Layout: One level     Bathroom Shower/Tub: Chief Strategy Officer: Standard Bathroom Accessibility: Yes   Home Equipment: None   Additional Comments: Home O2      Prior Functioning/Environment Prior Level of Function : Independent/Modified Independent                        OT Problem List: Decreased activity tolerance      OT Treatment/Interventions:      OT Goals(Current goals can be found in the care plan section) Acute  Rehab OT Goals Patient Stated Goal: Return home today OT Goal Formulation: With patient Time For Goal Achievement: 01/03/23 Potential to Achieve Goals: Good  OT Frequency:      Co-evaluation              AM-PAC OT "6 Clicks" Daily Activity     Outcome Measure Help from another person eating meals?: None Help from another person taking care of personal grooming?: None Help from another person toileting, which includes using toliet, bedpan, or urinal?: A Little Help from another person bathing (including washing, rinsing, drying)?: A Little Help from another person to put on and taking off regular upper body clothing?: None Help from another person to put on and taking off regular lower body clothing?: A Little 6 Click Score: 21   End of Session Equipment Utilized During Treatment: Gait belt;Oxygen Nurse Communication: Mobility status  Activity Tolerance: Patient limited by fatigue Patient left: in bed;with call bell/phone within reach;with family/visitor present  OT Visit Diagnosis: Unsteadiness on feet (R26.81)                Time: 1610-9604 OT Time Calculation (min): 21 min Charges:  OT General Charges $OT Visit: 1 Visit OT Evaluation $OT Eval Moderate Complexity: 1 Mod  12/20/2022  RP, OTR/L  Acute Rehabilitation Services  Office:  [phone]   Suzanna Obey 12/20/2022, 9:44 AM

## 2022-12-20 NOTE — Progress Notes (Addendum)
Initial Nutrition Assessment  DOCUMENTATION CODES:   Severe malnutrition in context of chronic illness  INTERVENTION:   - Provide Ensure Enlive BID, each supplement provides 350 kcal and 20 grams of protein. -Provide snacks  -Provide MVI with minerals daily   NUTRITION DIAGNOSIS:   Severe Malnutrition related to chronic illness as evidenced by severe muscle depletion, severe fat depletion.   GOAL:   Patient will meet greater than or equal to 90% of their needs   MONITOR:   PO intake, Weight trends, Supplement acceptance, Labs  REASON FOR ASSESSMENT:   Consult Assessment of nutrition requirement/status  ASSESSMENT:  62 Y.O Mal with PMH of  T2DM, COPD, CVA, asthma, idiopathic pulmonary fibrosis. Presented with vomiting and SOB, found to have acute on chronic hypoxic respiratory failure secondary to COPD exacerbation.  Family in room on visit. Family states pt has had little appetite which they contribute is from his medications. They report pt will eat 3 meals/day but it varies how much he will eat during those meals. He does drink 1-2 Ensure/day. Pt watches how many carbohydrate he eats and will avoid foods high in carbohydrates. Family states cold foods make his cough worse. This Morning, pt consumed 80% of his breakfast.   Encouraged good PO intake and eating smaller meals throughout the day to help with appetite. Pt was willing to try Ensure and snacks.   Family states pt's usual bodyweight is unknown. Pt was admitted on 05/18/22 for sepsis and had to be diuresed due to lower extremity edema and abdominal ascites. They suspect his usual body weight to be around 150-160 lbs and endorse pt losing some weight. Pt states having 3 episodes of emesis PTA but since being admitted no noted N/V and pt states bowel movements have been regular.   Admit weight: 69 kg  Current weight: 69 kg   12/19/22 69 kg  11/01/22 69 kg  07/01/22 74.4 kg  06/27/22 77.1 kg  06/13/22 77.1 kg   06/11/22 76.3 kg  06/11/22 76.6 kg  06/10/22 75.9 kg  05/18/22 81.6 kg  05/09/22 81 kg   Average Meal Intake: 11/29: 80% intake x 1 recorded meals  Nutritionally Relevant Medications: Scheduled Meds:  insulin aspart  0-20 Units Subcutaneous TID WC   insulin aspart  0-5 Units Subcutaneous QHS   Followed by   Melene Muller ON 12/21/2022] predniSONE  40 mg Oral Q breakfast   Pirfenidone  534 mg Oral TID with meals   Continuous Infusions:  cefTRIAXone (ROCEPHIN)  IV     Labs Reviewed: Calcium 8 CBG ranges from 137-204 mg/dL over the last 24 hours  NUTRITION - FOCUSED PHYSICAL EXAM:  Flowsheet Row Most Recent Value  Orbital Region Moderate depletion  Upper Arm Region Moderate depletion  Thoracic and Lumbar Region Severe depletion  Buccal Region Moderate depletion  Clavicle Bone Region Severe depletion  Clavicle and Acromion Bone Region Severe depletion  Scapular Bone Region Severe depletion  Dorsal Hand Severe depletion  Patellar Region Severe depletion  Anterior Thigh Region Severe depletion  Posterior Calf Region Severe depletion  Edema (RD Assessment) None  Hair Reviewed  Eyes Reviewed  Mouth Reviewed  Skin Reviewed  Nails Reviewed       Diet Order:   Diet Order             Diet regular Room service appropriate? Yes; Fluid consistency: Thin  Diet effective now                   EDUCATION  NEEDS:   Education needs have been addressed  Skin:  Skin Assessment: Reviewed RN Assessment  Last BM:  PTA  Height:   Ht Readings from Last 1 Encounters:  12/19/22 5\' 8"  (1.727 m)    Weight:   Wt Readings from Last 1 Encounters:  12/19/22 69 kg    Ideal Body Weight:  70 kg  BMI:  Body mass index is 23.13 kg/m.  Estimated Nutritional Needs:   Kcal:  1900-2100  Protein:  90-110 gm  Fluid:  >1.9L  Elliot Dally, RD Registered Dietitian  See Amion for more information

## 2022-12-20 NOTE — Progress Notes (Signed)
TR[facility] night cross cover note:   I was notified by RN of the patient's most recent blood pressure of 78/53 with MAP 63, which is slightly lower than earlier this evening.  No acute symptoms reported to be associated with his interval decrease in blood pressure.  Heart rate in the 70s.  He is here with acute COPD exacerbation, and is maintaining oxygen saturations at 100% on his baseline 5 L continuous nasal cannula.  Afebrile, respiratory rate 18.   He has no documented history of heart failure, and most recent echocardiogram in January 2024 showed LVEF 60 to 65%, normal diastolic parameters, and normal right ventricular systolic function.  He is currently receiving half-normal saline at 40 cc/h.  I subsequently discontinued the half-normal saline and ordered a 1 L LR bolus followed by continuous LR running at 125 cc/h for 8 hours.  It is noted that he had mildly elevated initial lactic acid level, with repeat lactic acid level currently pending.    Richard Pigg, DO Hospitalist

## 2022-12-20 NOTE — Progress Notes (Deleted)
PROGRESS NOTE  Richard Davenport RUE:[id number] DOB: Dec 19, 1960 DOA: 12/19/2022 PCP: Kalman Shan, MD   LOS: 1 day   Brief Narrative / Interim history: 62 year old male with history of DM2, COPD, pulmonary fibrosis, prior CVA who comes into the hospital with nausea, vomiting, as well as shortness of breath.  Patient tells me that he has been having increased cough more so over the last few days.  He has a degree of chronic cough but it has been worse and he felt like he was coming down with pneumonia.  He also reported nausea, vomiting on Thursday morning, self-limited.  He had significant wheezing and dyspnea in the ER and was admitted to the hospital with COPD exacerbation.  Imaging in the ER was concerning for infiltrates  Subjective / 24h Interval events: Feeling better this morning, overall stronger.  Assesement and Plan: Principal Problem:   Acute respiratory failure with hypercapnia (HCC) Active Problems:   Type 2 diabetes mellitus with hyperglycemia (HCC)   Idiopathic pulmonary fibrosis (HCC)   Cerebral aneurysm, nonruptured   Hyperlipidemia   Obesity   Nausea & vomiting  Principal problem Acute on chronic hypoxic respiratory failure due to acute COPD exacerbation due to possible underlying CAP -patient was started on antibiotics, nebulizers, steroids.  Continue -At home he is on 4 to 5 L at rest and up to 7 to 8 L depending on activities.  Appears to be at baseline oxygenation level this morning. -Continue supportive care, I have encouraged patient to ambulate more  Active problems ILD-outpatient follow-up with pulmonary  Nausea, vomiting-resolved.  No abdominal pain, no diarrhea.  LFTs are normal  Hypokalemia-replace potassium as needed, continue to monitor  Severe protein calorie malnutrition-RD consult  Anemia of chronic disease -hemoglobin overall stable, no bleeding  Thrombocytopenia-seems chronic, platelets were normal on admission but I wonder whether he was  hemoconcentrated  HLD - continue home regimen  DM2 -   Lab Results  Component Value Date   HGBA1C 4.9 12/20/2022   CBG (last 3)  Recent Labs    12/19/22 2138 12/20/22 0742  GLUCAP 204* 137*   Scheduled Meds:  budesonide  0.5 mg Nebulization BID   gabapentin  300 mg Oral TID   insulin aspart  0-20 Units Subcutaneous TID WC   insulin aspart  0-5 Units Subcutaneous QHS   methylPREDNISolone (SOLU-MEDROL) injection  125 mg Intravenous Q12H   Followed by   Melene Muller ON 12/21/2022] predniSONE  40 mg Oral Q breakfast   Pirfenidone  534 mg Oral TID with meals   Continuous Infusions:  cefTRIAXone (ROCEPHIN)  IV     PRN Meds:.guaiFENesin  Current Outpatient Medications  Medication Instructions   albuterol (PROVENTIL) 2.5 mg, Nebulization, Every 4 hours PRN   albuterol (VENTOLIN HFA) 108 (90 Base) MCG/ACT inhaler 2 puffs, Inhalation, Every 6 hours PRN   budesonide (PULMICORT) 0.5 mg, Nebulization, 4 times daily   dextromethorphan (DELSYM) 30 mg, Oral, 2 times daily PRN   furosemide (LASIX) 20 mg, Oral, Daily   gabapentin (NEURONTIN) 300 mg, Oral, 3 times daily   metFORMIN (GLUCOPHAGE) 500 mg, Oral, 2 times daily   mometasone (ELOCON) 0.1 % ointment 1 application to left ear once daily.   omeprazole (PRILOSEC) 20 mg, Oral, Daily   Pirfenidone 801 mg, Oral, 3 times daily with meals, Month 2 and onwards   predniSONE (DELTASONE) 10 MG tablet 40 mg daily x 2 days, then 20mg  daily x 2 days, then 10mg  daily x 2 days, then 5mg  daily x 2 days  and stop   predniSONE (DELTASONE) 2.5 mg, Oral, See admin instructions, Take 3 tablets once daily for 7 days, then 2 tablets once daily for 14 days, then 1 tablet once daily for 7 days.    riTUXimab (RITUXAN IV) 1 Dose, Intravenous, Every 6 months, Unknown dosage   spironolactone (ALDACTONE) 50 mg, Oral, Daily    Diet Orders (From admission, onward)     Start     Ordered   12/19/22 2213  Diet regular Room service appropriate? Yes; Fluid consistency:  Thin  Diet effective now       Question Answer Comment  Room service appropriate? Yes   Fluid consistency: Thin      12/19/22 2212            DVT prophylaxis: SCDs Start: 12/19/22 2213   Lab Results  Component Value Date   PLT 108 (L) 12/20/2022      Code Status: Full Code  Family Communication: daughter at bedside   Status is: Inpatient Remains inpatient appropriate because: severity of illness  Level of care: Telemetry Medical  Consultants:  none  Objective: Vitals:   12/20/22 0611 12/20/22 0612 12/20/22 0829 12/20/22 0856  BP: 98/75 98/70 106/73   Pulse: 69 72 84   Resp:   16   Temp:   97.6 F (36.4 C)   TempSrc:   Oral   SpO2: 100% 100% 99% 98%  Weight:      Height:        Intake/Output Summary (Last 24 hours) at 12/20/2022 0932 Last data filed at 12/19/2022 2359 Gross per 24 hour  Intake 120 ml  Output 200 ml  Net -80 ml   Wt Readings from Last 3 Encounters:  12/19/22 69 kg  11/01/22 69 kg  07/01/22 74.4 kg    Examination:  Constitutional: NAD Eyes: no scleral icterus ENMT: Mucous membranes are moist.  Neck: normal, supple Respiratory: Velcro type sounds bilateral lower bases, end expiratory wheezing throughout both lung fields Cardiovascular: Regular rate and rhythm, no murmurs / rubs / gallops. No LE edema.  Abdomen: non distended, no tenderness. Bowel sounds positive.  Musculoskeletal: no clubbing / cyanosis.    Data Reviewed: I have independently reviewed following labs and imaging studies   CBC Recent Labs  Lab 12/19/22 1604 12/19/22 1609 12/20/22 0543  WBC 7.1  --  4.1  HGB 10.0* 10.9* 8.1*  HCT 32.2* 32.0* 26.2*  PLT 172  --  108*  MCV 113.8*  --  112.0*  MCH 35.3*  --  34.6*  MCHC 31.1  --  30.9  RDW 16.0*  --  15.8*  LYMPHSABS 0.1*  --   --   MONOABS 0.0*  --   --   EOSABS 0.3  --   --   BASOSABS 0.0  --   --     Recent Labs  Lab 12/17/22 1446 12/19/22 1609 12/19/22 1632 12/19/22 1839 12/19/22 2038  12/20/22 0543  NA  --  143 142  --  140 135  K  --  5.3* 3.0*  --  4.2 4.5  CL  --   --  113*  --  104 105  CO2  --   --  22  --  25 26  GLUCOSE  --   --  134*  --  176* 172*  BUN  --   --  11  --  13 16  CREATININE  --   --  0.80  --  1.01 0.87  CALCIUM  --   --  6.8*  --  8.4* 8.0*  AST 44*  --  34  --  42* 32  ALT 21  --  16  --  21 18  ALKPHOS 316*  --  159*  --  191* 167*  BILITOT 0.8  --  0.9  --  1.1 0.7  ALBUMIN 2.1*  --  <1.5*  --  1.5* <1.5*  LATICACIDVEN  --   --   --  2.6*  --   --   HGBA1C  --   --   --   --   --  4.9  AMMONIA 85  --   --   --   --   --     ------------------------------------------------------------------------------------------------------------------ No results for input(s): "CHOL", "HDL", "LDLCALC", "TRIG", "CHOLHDL", "LDLDIRECT" in the last 72 hours.  Lab Results  Component Value Date   HGBA1C 4.9 12/20/2022   ------------------------------------------------------------------------------------------------------------------ No results for input(s): "TSH", "T4TOTAL", "T3FREE", "THYROIDAB" in the last 72 hours.  Invalid input(s): "FREET3"  Cardiac Enzymes No results for input(s): "CKMB", "TROPONINI", "MYOGLOBIN" in the last 168 hours.  Invalid input(s): "CK" ------------------------------------------------------------------------------------------------------------------    Component Value Date/Time   BNP 156.8 (H) 01/04/2022 1159    CBG: Recent Labs  Lab 12/19/22 2138 12/20/22 0742  GLUCAP 204* 137*    Recent Results (from the past 240 hour(s))  Resp panel by RT-PCR (RSV, Flu A&B, Covid) Anterior Nasal Swab     Status: None   Collection Time: 12/19/22  4:04 PM   Specimen: Anterior Nasal Swab  Result Value Ref Range Status   SARS Coronavirus 2 by RT PCR NEGATIVE NEGATIVE Final   Influenza A by PCR NEGATIVE NEGATIVE Final   Influenza B by PCR NEGATIVE NEGATIVE Final    Comment: (NOTE) The Xpert Xpress SARS-CoV-2/FLU/RSV plus  assay is intended as an aid in the diagnosis of influenza from Nasopharyngeal swab specimens and should not be used as a sole basis for treatment. Nasal washings and aspirates are unacceptable for Xpert Xpress SARS-CoV-2/FLU/RSV testing.  Fact Sheet for Patients: BloggerCourse.com  Fact Sheet for Healthcare Providers: SeriousBroker.it  This test is not yet approved or cleared by the [place] FDA and has been authorized for detection and/or diagnosis of SARS-CoV-2 by FDA under an Emergency Use Authorization (EUA). This EUA will remain in effect (meaning this test can be used) for the duration of the COVID-19 declaration under Section 564(b)(1) of the Act, 21 U.S.C. section 360bbb-3(b)(1), unless the authorization is terminated or revoked.     Resp Syncytial Virus by PCR NEGATIVE NEGATIVE Final    Comment: (NOTE) Fact Sheet for Patients: BloggerCourse.com  Fact Sheet for Healthcare Providers: SeriousBroker.it  This test is not yet approved or cleared by the [place] FDA and has been authorized for detection and/or diagnosis of SARS-CoV-2 by FDA under an Emergency Use Authorization (EUA). This EUA will remain in effect (meaning this test can be used) for the duration of the COVID-19 declaration under Section 564(b)(1) of the Act, 21 U.S.C. section 360bbb-3(b)(1), unless the authorization is terminated or revoked.  Performed at [facility], [address] [place], [place] [address]      Radiology Studies: DG Chest Port 1 View  Result Date: 12/19/2022 CLINICAL DATA:  Shortness of breath. Multiple episodes of vomiting. History of pulmonary fibrosis. EXAM: PORTABLE CHEST 1 VIEW COMPARISON:  Chest x-ray 05/11/2022. FINDINGS: Underinflation. Again areas of interstitial septal thickening identified particularly right upper lung and left lung base. There is more  opacity than  on prior. This could relate to the poor inflation although superimposed infiltrates not excluded. Recommend close follow-up. No pneumothorax or effusion. Normal cardiopericardial silhouette when adjusting for technique. Overlapping cardiac leads. Air-fluid level along the stomach beneath the left hemidiaphragm. Loop recorder. IMPRESSION: Underinflation. Again areas of interstitial septal thickening identified particularly right upper lung and left lung base. There is more opacity than on prior. This could relate to the poor inflation although superimposed infiltrates not excluded. Recommend close follow-up. Electronically Signed   By: Karen Kays M.D.   On: 12/19/2022 16:31     Pamella Pert, MD, PhD [facility]  Between 7 am - 7 pm I am available, please contact me via Amion (for emergencies) or Securechat (non urgent messages)  Between 7 pm - 7 am I am not available, please contact night coverage MD/APP via Amion

## 2022-12-20 NOTE — Progress Notes (Signed)
0202h 11/29: Pt Pirfenidone Home Medication was counted, walked and handed to Pharmacy in person via sealed bag and paperwork placed on chart.

## 2022-12-20 NOTE — Plan of Care (Signed)

## 2022-12-24 LAB — CULTURE, BLOOD (ROUTINE X 2)
Culture: NO GROWTH
Culture: NO GROWTH

## 2022-12-25 ENCOUNTER — Telehealth: Payer: Self-pay | Admitting: Pharmacy Technician

## 2022-12-25 ENCOUNTER — Other Ambulatory Visit: Payer: Self-pay | Admitting: Pharmacy Technician

## 2022-12-25 DIAGNOSIS — J849 Interstitial pulmonary disease, unspecified: Secondary | ICD-10-CM | POA: Insufficient documentation

## 2022-12-25 NOTE — Telephone Encounter (Signed)
Auth Submission: NO AUTH NEEDED Site of care: Site of care: [facility] Payer: [employer] medicaid Medication & CPT/J Code(s) submitted: Truxima (Rituximab-abbs) [phone] Route of submission (phone, fax, portal): phone Phone #[phone] Fax # Auth type: Buy/Bill PB Units/visits requested: 1000mg  iv q6 months Reference number: [id number] Approval from: 12/25/22 to 01/21/23

## 2023-01-01 ENCOUNTER — Ambulatory Visit: Payer: Medicaid Other

## 2023-01-01 VITALS — BP 129/65 | HR 81 | Temp 98.6°F | Resp 22 | Ht 68.0 in | Wt 152.6 lb

## 2023-01-01 DIAGNOSIS — J849 Interstitial pulmonary disease, unspecified: Secondary | ICD-10-CM

## 2023-01-01 DIAGNOSIS — J84112 Idiopathic pulmonary fibrosis: Secondary | ICD-10-CM | POA: Diagnosis not present

## 2023-01-01 MED ORDER — METHYLPREDNISOLONE SODIUM SUCC 125 MG IJ SOLR
62.5000 mg | Freq: Once | INTRAMUSCULAR | Status: AC
Start: 2023-01-01 — End: 2023-01-01
  Administered 2023-01-01: 62.5 mg via INTRAVENOUS
  Filled 2023-01-01: qty 2

## 2023-01-01 MED ORDER — ACETAMINOPHEN 325 MG PO TABS
650.0000 mg | ORAL_TABLET | Freq: Once | ORAL | Status: AC
  Administered 2023-01-01: 650 mg via ORAL
  Filled 2023-01-01: qty 2

## 2023-01-01 MED ORDER — SODIUM CHLORIDE 0.9 % IV SOLN
1000.0000 mg | Freq: Once | INTRAVENOUS | Status: AC
Start: 2023-01-01 — End: 2023-01-01
  Administered 2023-01-01: 1000 mg via INTRAVENOUS
  Filled 2023-01-01: qty 100

## 2023-01-01 MED ORDER — DIPHENHYDRAMINE HCL 25 MG PO CAPS
50.0000 mg | ORAL_CAPSULE | Freq: Once | ORAL | Status: AC
  Administered 2023-01-01: 50 mg via ORAL
  Filled 2023-01-01: qty 2

## 2023-01-01 NOTE — Progress Notes (Signed)
Diagnosis: Idiopathic Pulmonary Fibrosis (IPF)  Provider:  Chilton Greathouse MD  Procedure: IV Infusion  IV Type: Peripheral, IV Location: L Antecubital  Truxima (Rituximab-abbs), Dose: 1000 mg  Infusion Start Time: 1115  Infusion Stop Time: 1439  Post Infusion IV Care: Patient declined observation and Peripheral IV Discontinued  Discharge: Condition: Good, Destination: Home . AVS Declined  Performed by:  Loney Hering, LPN

## 2023-01-07 ENCOUNTER — Ambulatory Visit: Payer: Medicaid Other | Admitting: Allergy and Immunology

## 2023-01-07 VITALS — BP 116/80 | HR 85 | Temp 98.1°F | Resp 18 | Ht 68.0 in | Wt 153.0 lb

## 2023-01-07 DIAGNOSIS — J84112 Idiopathic pulmonary fibrosis: Secondary | ICD-10-CM | POA: Diagnosis not present

## 2023-01-07 DIAGNOSIS — D849 Immunodeficiency, unspecified: Secondary | ICD-10-CM

## 2023-01-07 DIAGNOSIS — D892 Hypergammaglobulinemia, unspecified: Secondary | ICD-10-CM

## 2023-01-07 DIAGNOSIS — L989 Disorder of the skin and subcutaneous tissue, unspecified: Secondary | ICD-10-CM

## 2023-01-07 DIAGNOSIS — Q999 Chromosomal abnormality, unspecified: Secondary | ICD-10-CM | POA: Diagnosis not present

## 2023-01-07 DIAGNOSIS — K7460 Unspecified cirrhosis of liver: Secondary | ICD-10-CM | POA: Diagnosis not present

## 2023-01-07 MED ORDER — MOMETASONE FUROATE 0.1 % EX OINT: TOPICAL_OINTMENT | Freq: Every day | CUTANEOUS | 1 refills | Status: DC

## 2023-01-07 MED ORDER — ALBUTEROL SULFATE (2.5 MG/3ML) 0.083% IN NEBU: 2.5000 mg | INHALATION_SOLUTION | RESPIRATORY_TRACT | 1 refills | Status: DC | PRN

## 2023-01-07 MED ORDER — ALBUTEROL SULFATE HFA 108 (90 BASE) MCG/ACT IN AERS: 2.0000 | INHALATION_SPRAY | Freq: Four times a day (QID) | RESPIRATORY_TRACT | 2 refills | Status: DC | PRN

## 2023-01-07 MED ORDER — BUDESONIDE 0.5 MG/2ML IN SUSP: 0.5000 mg | Freq: Four times a day (QID) | RESPIRATORY_TRACT | 5 refills | Status: DC

## 2023-01-07 NOTE — Patient Instructions (Addendum)
  1. Continue oxygen at dose that is needed to maintain oxygen above 88-90%  2. Continue  nebulization 4 times per day:   A. Budesonide 0.5 mg + albuterol 2.5 mg  3. Treat left ear with mometasone 0.1% ointment 1 time per day  4. Blood - SPEP w/ immunoelectrophoresis  5. Return to clinic in 12 weeks or earlier if problem

## 2023-01-07 NOTE — Progress Notes (Unsigned)
- [place] [facility] [place] - [place] - [place]   Follow-up Note  Referring Provider: Kalman Shan, MD Primary Provider: Kalman Shan, MD Date of Office Visit: 01/07/2023  Subjective:   Richard Davenport (DOB: 1960-08-12) is a 62 y.o. 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  I last saw him in his clinic 17 December 2022.  During his last visit we started him on nebulized albuterol and budesonide and that has resulted in pretty significant improvement regarding his cough and his shortness of breath.  He still very short of breath if he exerts himself to any extent and he must use oxygen during exertion and is well uses oxygen 24/7.  But he can definitely tell a difference utilizing nebulizer therapy consistently.  For some reason he never did receive the topical steroid for his ear eczema.  Allergies as of 01/07/2023       Reactions   Pork-derived Products Other (See Comments)   Patient is Muslim and PREFERS TO NOT TAKE ANY PORK OR MEAT PRODUCTS (only fish)        Medication List    albuterol (2.5 MG/3ML) 0.083% nebulizer solution Commonly known as: PROVENTIL Take 3 mLs (2.5 mg total) by nebulization every 4 (four) hours as needed for wheezing or shortness of breath.   albuterol 108 (90 Base) MCG/ACT inhaler Commonly known as: VENTOLIN HFA Inhale 2 puffs into the lungs every 6 (six) hours as needed for wheezing or shortness of breath.   budesonide 0.5 MG/2ML nebulizer solution Commonly known as: Pulmicort Take 2 mLs (0.5 mg total) by nebulization in the morning, at noon, in the evening, and at bedtime.   Delsym 30 MG/5ML liquid Generic drug: dextromethorphan Take 30 mg by mouth 2 (two) times daily as needed for cough.   furosemide 40 MG tablet Commonly known as:  LASIX Take 0.5 tablets (20 mg total) by mouth daily.   gabapentin 300 MG capsule Commonly known as: NEURONTIN Take 300 mg by mouth 3 (three) times daily.   metFORMIN 500 MG tablet Commonly known as: GLUCOPHAGE Take 1 tablet (500 mg total) by mouth 2 (two) times daily. What changed: when to take this   mometasone 0.1 % ointment Commonly known as: ELOCON Apply topically daily. 1 application to left ear once daily.   omeprazole 20 MG capsule Commonly known as: PRILOSEC Take 20 mg by mouth daily.   Pirfenidone 267 MG Tabs Take 3 tablets (801 mg total) by mouth with breakfast, with lunch, and with evening meal. Month 2 and onwards   predniSONE 2.5 MG tablet Commonly known as: DELTASONE Take 2.5 mg by mouth See admin instructions. Take 3 tablets once daily for 7 days, then 2 tablets once daily for 14 days, then 1 tablet once daily for 7 days.   RITUXAN IV Inject 1 Dose into the vein every 6 (six) months. Unknown dosage   spironolactone 50 MG tablet Commonly known as: ALDACTONE Take 50 mg by mouth daily.    Past Medical History:  Diagnosis Date   Asthma    Diabetes mellitus without complication (HCC)    Headache    Idiopathic pulmonary fibrosis (HCC)    Stroke (HCC)    Subarachnoid hemorrhage (HCC)     Past Surgical History:  Procedure Laterality Date   ANEURYSM COILING     for  bleed   LOOP RECORDER INSERTION N/A 04/10/2018   Procedure: LOOP RECORDER INSERTION;  Surgeon: Duke Salvia, MD;  Location: [facility]VASIVE CV LAB;  Service: Cardiovascular;  Laterality: N/A;   RADIOLOGY WITH ANESTHESIA N/A 10/09/2012   Procedure: RADIOLOGY WITH ANESTHESIA;  Surgeon: Lisbeth Renshaw, MD;  Location: [facility] OR;  Service: Radiology;  Laterality: N/A;   RADIOLOGY WITH ANESTHESIA N/A 05/27/2013   Procedure: RADIOLOGY WITH ANESTHESIA;  Surgeon: Lisbeth Renshaw, MD;  Location: [facility] OR;  Service: Radiology;  Laterality: N/A;    Review of systems negative except as noted in HPI / PMHx or  noted below:  Review of Systems  Constitutional: Negative.   HENT: Negative.    Eyes: Negative.   Respiratory: Negative.    Cardiovascular: Negative.   Gastrointestinal: Negative.   Genitourinary: Negative.   Musculoskeletal: Negative.   Skin: Negative.   Neurological: Negative.   Endo/Heme/Allergies: Negative.   Psychiatric/Behavioral: Negative.       Objective:   Vitals:   01/07/23 1014  BP: 116/80  Pulse: 85  Resp: 18  Temp: 98.1 F (36.7 C)  SpO2: 100%   Height: 5\' 8"  (172.7 cm)  Weight: 153 lb (69.4 kg)   Physical Exam Constitutional:      Appearance: He is not diaphoretic.  HENT:     Head: Normocephalic.     Right Ear: Tympanic membrane, ear canal and external ear normal.     Left Ear: Tympanic membrane, ear canal and external ear normal.     Nose: Nose normal. No mucosal edema or rhinorrhea.     Mouth/Throat:     Pharynx: Uvula midline. No oropharyngeal exudate.  Eyes:     Conjunctiva/sclera: Conjunctivae normal.  Neck:     Thyroid: No thyromegaly.     Trachea: Trachea normal. No tracheal tenderness or tracheal deviation.  Cardiovascular:     Rate and Rhythm: Normal rate and regular rhythm.     Heart sounds: Normal heart sounds, S1 normal and S2 normal. No murmur heard. Pulmonary:     Effort: No respiratory distress.     Breath sounds: Normal breath sounds. No stridor. No wheezing (widespread inspiratory crackles.  No wheezing) or rales.  Lymphadenopathy:     Head:     Right side of head: No tonsillar adenopathy.     Left side of head: No tonsillar adenopathy.     Cervical: No cervical adenopathy.  Skin:    Findings: Rash (Duration scale involving left external ear) present. No erythema.     Nails: There is no clubbing.  Neurological:     Mental Status: He is alert.     Diagnostics: Results of blood tests obtained 17 December 2022 identified alkaline phosphatase 316 MGs/DL, AST 16X/W, ALT 96E/A, ammonia 85 UG/DL IgG 5409 Mg/DL, IgA 8119 Mg/DL,  IgM 64 Mg/DL  Assessment and Plan:   1. Hypergammaglobulinemia   2. 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 Idiopathic pulmonary fibrosis (HCC)   4. Cirrhosis of liver without ascites, unspecified hepatic cirrhosis type (HCC)   5. Immunosuppression (HCC)   6. Inflammatory dermatosis    1. Continue oxygen at dose that is needed to maintain oxygen above 88-90%  2. Continue  nebulization 4 times per day:   A. Budesonide 0.5 mg + albuterol 2.5 mg  3. Treat left ear with mometasone 0.1% ointment 1 time per day  4. Blood - SPEP w/ immunoelectrophoresis  5. Return to clinic in 12 weeks or earlier if problem  We  will further evaluate Youssef's immune dysfunction with hypergammaglobulinemia with a immunoelectrophoresis to check for a monoclonal protein.  He will continue to use a combination of budesonide and albuterol which appears to have helped him pretty significantly and continue on oxygen and he can use some topical steroid to his ear.  Will see him back in this clinic in 12 weeks or earlier if there is a problem.  Laurette Schimke, MD Allergy / Immunology [facility]

## 2023-01-08 ENCOUNTER — Encounter: Payer: Self-pay | Admitting: Allergy and Immunology

## 2023-01-13 LAB — PE+INTERP(RFX IFE+FLC), S
A/G Ratio: 0.3 — ABNORMAL LOW (ref 0.7–1.7)
Albumin ELP: 1.7 g/dL — ABNORMAL LOW (ref 2.9–4.4)
Alpha 1: 0.4 g/dL (ref 0.0–0.4)
Alpha 2: 0.7 g/dL (ref 0.4–1.0)
Beta: 1.5 g/dL — ABNORMAL HIGH (ref 0.7–1.3)
Gamma Globulin: 2.7 g/dL — ABNORMAL HIGH (ref 0.4–1.8)
Globulin, Total: 5.2 g/dL — ABNORMAL HIGH (ref 2.2–3.9)
Total Protein: 6.9 g/dL (ref 6.0–8.5)

## 2023-01-28 ENCOUNTER — Ambulatory Visit: Payer: Medicaid Other | Admitting: Family

## 2023-02-02 ENCOUNTER — Emergency Department (HOSPITAL_COMMUNITY): Payer: Medicaid Other

## 2023-02-02 ENCOUNTER — Other Ambulatory Visit: Payer: Self-pay

## 2023-02-02 ENCOUNTER — Encounter (HOSPITAL_COMMUNITY): Payer: Self-pay | Admitting: *Deleted

## 2023-02-02 ENCOUNTER — Emergency Department (HOSPITAL_COMMUNITY)
Admission: EM | Admit: 2023-02-02 | Discharge: 2023-02-02 | Disposition: A | Discharge status: Home / Self Care | Payer: Medicaid Other

## 2023-02-02 DIAGNOSIS — R059 Cough, unspecified: Secondary | ICD-10-CM | POA: Diagnosis present

## 2023-02-02 DIAGNOSIS — J441 Chronic obstructive pulmonary disease with (acute) exacerbation: Secondary | ICD-10-CM | POA: Diagnosis not present

## 2023-02-02 DIAGNOSIS — Z8673 Personal history of transient ischemic attack (TIA), and cerebral infarction without residual deficits: Secondary | ICD-10-CM | POA: Diagnosis not present

## 2023-02-02 DIAGNOSIS — Z20822 Contact with and (suspected) exposure to covid-19: Secondary | ICD-10-CM | POA: Diagnosis not present

## 2023-02-02 DIAGNOSIS — E119 Type 2 diabetes mellitus without complications: Secondary | ICD-10-CM | POA: Diagnosis not present

## 2023-02-02 DIAGNOSIS — Z9981 Dependence on supplemental oxygen: Secondary | ICD-10-CM | POA: Diagnosis not present

## 2023-02-02 DIAGNOSIS — Z7984 Long term (current) use of oral hypoglycemic drugs: Secondary | ICD-10-CM | POA: Diagnosis not present

## 2023-02-02 LAB — BASIC METABOLIC PANEL
Anion gap: 8 (ref 5–15)
BUN: 17 mg/dL (ref 6–20)
CO2: 20 mmol/L — ABNORMAL LOW (ref 22–32)
Calcium: 7.5 mg/dL — ABNORMAL LOW (ref 8.9–10.3)
Chloride: 110 mmol/L (ref 98–111)
Creatinine, Ser: 0.73 mg/dL (ref 0.61–1.24)
GFR, Estimated: >60 mL/min (ref >60)
Glucose, Bld: 123 mg/dL — ABNORMAL HIGH (ref 70–99)
Potassium: 4.2 mmol/L (ref 3.5–5.1)
Sodium: 138 mmol/L (ref 135–145)

## 2023-02-02 LAB — CBC
HCT: 29.4 % — ABNORMAL LOW (ref 39.0–52.0)
Hemoglobin: 9.2 g/dL — ABNORMAL LOW (ref 13.0–17.0)
MCH: 36.9 pg — ABNORMAL HIGH (ref 26.0–34.0)
MCHC: 31.3 g/dL (ref 30.0–36.0)
MCV: 118.1 fL — ABNORMAL HIGH (ref 80.0–100.0)
Platelets: 131 10*3/uL — ABNORMAL LOW (ref 150–400)
RBC: 2.49 MIL/uL — ABNORMAL LOW (ref 4.22–5.81)
RDW: 15.6 % — ABNORMAL HIGH (ref 11.5–15.5)
WBC: 5.6 10*3/uL (ref 4.0–10.5)
nRBC: 0 % (ref 0.0–0.2)

## 2023-02-02 LAB — RESP PANEL BY RT-PCR (RSV, FLU A&B, COVID)  RVPGX2
Influenza A by PCR: NEGATIVE
Influenza B by PCR: NEGATIVE
Resp Syncytial Virus by PCR: NEGATIVE
SARS Coronavirus 2 by RT PCR: NEGATIVE

## 2023-02-02 MED ORDER — IPRATROPIUM-ALBUTEROL 0.5-2.5 (3) MG/3ML IN SOLN
3.0000 mL | Freq: Once | RESPIRATORY_TRACT | Status: AC
  Administered 2023-02-02: 3 mL via RESPIRATORY_TRACT
  Filled 2023-02-02: qty 3

## 2023-02-02 MED ORDER — PREDNISONE 20 MG PO TABS: 20.0000 mg | ORAL_TABLET | Freq: Every day | ORAL | 0 refills | Status: AC

## 2023-02-02 NOTE — Discharge Instructions (Signed)
 As discussed take your albuterol  inhaler this evening before bed.  Then every 6 hours tomorrow.  Then as needed thereafter.  We are prescribing you steroids.  Take them starting tomorrow for the full course.  Return immediately if your shortness of breath returns, develop chest pain, lightheadedness, passout or he develop any new or worsening symptoms that are concerning to you.

## 2023-02-02 NOTE — ED Provider Notes (Signed)
 [place] EMERGENCY DEPARTMENT AT [facility] Provider Note   CSN: [id number] Arrival date & time: 02/02/23  1357     History  Chief Complaint  Patient presents with   Cough    Richard Davenport is a 63 y.o. male.  This is a 63 year old male pulmonary fibrosis/COPD on chronic oxygen 5 L nasal cannula.  Began feeling unwell yesterday with generalized malaise, subjective fevers and cough overnight into today.  Productive.  No change in sputum color.  Slightly worsening shortness of breath.  No chest pain no nausea vomiting diarrhea.   Cough      Home Medications Prior to Admission medications   Medication Sig Start Date End Date Taking? Authorizing Provider  predniSONE  (DELTASONE ) 20 MG tablet Take 1 tablet (20 mg total) by mouth daily with breakfast for 3 days. 02/03/23 02/06/23 Yes Miley Lindon, Caron PARAS, DO  albuterol  (PROVENTIL ) (2.5 MG/3ML) 0.083% nebulizer solution Take 3 mLs (2.5 mg total) by nebulization every 4 (four) hours as needed for wheezing or shortness of breath. 01/07/23   Kozlow, Camellia PARAS, MD  albuterol  (VENTOLIN  HFA) 108 (90 Base) MCG/ACT inhaler Inhale 2 puffs into the lungs every 6 (six) hours as needed for wheezing or shortness of breath. 01/07/23   Kozlow, Camellia PARAS, MD  budesonide  (PULMICORT ) 0.5 MG/2ML nebulizer solution Take 2 mLs (0.5 mg total) by nebulization in the morning, at noon, in the evening, and at bedtime. 01/07/23   Kozlow, Camellia PARAS, MD  dextromethorphan  (DELSYM ) 30 MG/5ML liquid Take 30 mg by mouth 2 (two) times daily as needed for cough.    [provider]  furosemide  (LASIX ) 40 MG tablet Take 0.5 tablets (20 mg total) by mouth daily. 05/20/22 05/20/23  Amin, Sumayya, MD  gabapentin  (NEURONTIN ) 300 MG capsule Take 300 mg by mouth 3 (three) times daily. 08/28/22   [provider]  metFORMIN  (GLUCOPHAGE ) 500 MG tablet Take 1 tablet (500 mg total) by mouth 2 (two) times daily. Patient taking differently: Take 500 mg by mouth daily.  12/14/17   Randol Simmonds, MD  mometasone  (ELOCON ) 0.1 % ointment Apply topically daily. 1 application to left ear once daily. 01/07/23   Kozlow, Camellia PARAS, MD  omeprazole (PRILOSEC) 20 MG capsule Take 20 mg by mouth daily.    [provider]  Pirfenidone  267 MG TABS Take 3 tablets (801 mg total) by mouth with breakfast, with lunch, and with evening meal. Month 2 and onwards Patient taking differently: Take 534 mg by mouth with breakfast, with lunch, and with evening meal. 03/13/22   Olalere, Adewale A, MD  riTUXimab  (RITUXAN  IV) Inject 1 Dose into the vein every 6 (six) months. Unknown dosage    [provider]  spironolactone  (ALDACTONE ) 50 MG tablet Take 50 mg by mouth daily. 05/06/22 05/06/23  [provider]      Allergies    Pork-derived products    Review of Systems   Review of Systems  Respiratory:  Positive for cough.     Physical Exam Updated Vital Signs BP 122/88 (BP Location: Right Arm)   Pulse (!) 103   Temp 98.3 F (36.8 C) (Oral)   Resp (!) 32   Ht 5' 8 (1.727 m)   Wt 69.4 kg   SpO2 99%   BMI 23.26 kg/m  Physical Exam Vitals and nursing note reviewed.  Constitutional:      General: He is not in acute distress.    Appearance: He is not toxic-appearing.  HENT:  Head: Normocephalic.     Nose: Nose normal.     Mouth/Throat:     Mouth: Mucous membranes are moist.  Cardiovascular:     Rate and Rhythm: Normal rate and regular rhythm.  Pulmonary:     Effort: Pulmonary effort is normal.  Abdominal:     General: Abdomen is flat. There is no distension.     Tenderness: There is no abdominal tenderness. There is no guarding or rebound.  Musculoskeletal:        General: Normal range of motion.     Right lower leg: No edema.     Left lower leg: No edema.  Skin:    General: Skin is warm.     Capillary Refill: Capillary refill takes less than 2 seconds.  Neurological:     Mental Status: He is alert and oriented to person, place, and time.   Psychiatric:        Mood and Affect: Mood normal.     ED Results / Procedures / Treatments   Labs (all labs ordered are listed, but only abnormal results are displayed) Labs Reviewed  CBC - Abnormal; Notable for the following components:      Result Value   RBC 2.49 (*)    Hemoglobin 9.2 (*)    HCT 29.4 (*)    MCV 118.1 (*)    MCH 36.9 (*)    RDW 15.6 (*)    Platelets 131 (*)    All other components within normal limits  BASIC METABOLIC PANEL - Abnormal; Notable for the following components:   CO2 20 (*)    Glucose, Bld 123 (*)    Calcium  7.5 (*)    All other components within normal limits  RESP PANEL BY RT-PCR (RSV, FLU A&B, COVID)  RVPGX2    EKG None  Radiology DG Chest Port 1 View Result Date: 02/02/2023 CLINICAL DATA:  Cough.  Pulmonary fibrosis. EXAM: PORTABLE CHEST 1 VIEW COMPARISON:  12/19/2022 and 05/19/2022 FINDINGS: The heart size and mediastinal contours are within normal limits. Stable low lung volumes and chronic interstitial lung disease. No acute or superimposed pulmonary opacity. No pleural effusion. IMPRESSION: Stable chronic interstitial lung disease. No acute findings. Electronically Signed   By: Norleen DELENA Kil M.D.   On: 02/02/2023 15:39    Procedures Procedures    Medications Ordered in ED Medications  ipratropium-albuterol  (DUONEB) 0.5-2.5 (3) MG/3ML nebulizer solution 3 mL (3 mLs Nebulization Given 02/02/23 1532)    ED Course/ Medical Decision Making/ A&P Clinical Course as of 02/02/23 2019  Sun Feb 02, 2023  1431 Discharged 11/29 for COPD exacerbation per chart review.  [TY]  1551 CBC(!) No leukocytosis to suggest systemic infection.  Stable chronic anemia as compared to prior. [TY]  1551 DG Chest Port 1 View IMPRESSION: Stable chronic interstitial lung disease. No acute findings.   [TY]  1637 Patient feeling improved after breathing treatment.  No pneumonia on chest x-ray.  Workup largely reassuring.  Will discharge with short course of  steroids. [TY]    Clinical Course User Index [TY] Neysa Caron PARAS, DO                                 Medical Decision Making Is a 63 year old male with history of prior stroke, diabetes, idiopathic pulmonary fibrosis on chronic oxygen use at 5 L presenting to the emergency department for generalized malaise cough and shortness of shortness of breath.  He  is afebrile mildly elevated heart rate, hemodynamically stable.  Maintaining his oxygen saturation on his home oxygen.  Does not appear to be in respiratory distress.  Some decreased air movement on exam, but no overt wheezing.  Speaking in full sentences.  Will get screening labs and chest x-ray.  Will give DuoNeb and reevaluate.  Amount and/or Complexity of Data Reviewed Labs: ordered. Decision-making details documented in ED Course. Radiology: ordered. Decision-making details documented in ED Course. ECG/medicine tests: ordered.  Risk Prescription drug management.         Final Clinical Impression(s) / ED Diagnoses Final diagnoses:  COPD exacerbation (HCC)    Rx / DC Orders ED Discharge Orders          Ordered    predniSONE  (DELTASONE ) 20 MG tablet  Daily with breakfast        02/02/23 1639              Neysa Caron PARAS, DO 02/02/23 2019

## 2023-02-02 NOTE — ED Triage Notes (Signed)
 Pt here via  [facility] from home.  He is worried he has pneumonia.  Dx with pulmonary fibrosis 8 months ago and lives on 5L [place].  No need to increase his O2 needs.  Just states general malaise and cough.  Took 1 tylenol  around 10 am.   VS 103 hr Bp 118/84 Sats 96% 5L Co2 30 Temp 98.1  Given 1 duo nebx and 125 solumedrol

## 2023-02-10 ENCOUNTER — Encounter: Payer: Self-pay | Admitting: Internal Medicine

## 2023-02-11 ENCOUNTER — Encounter (HOSPITAL_COMMUNITY): Payer: Self-pay

## 2023-02-11 ENCOUNTER — Encounter: Payer: Self-pay | Admitting: Allergy and Immunology

## 2023-02-11 ENCOUNTER — Inpatient Hospital Stay (HOSPITAL_COMMUNITY)
Admission: EM | Admit: 2023-02-11 | Discharge: 2023-02-15 | DRG: 196 | Disposition: A | Discharge status: Home Health | Payer: Medicaid Other | Attending: Internal Medicine | Admitting: Internal Medicine

## 2023-02-11 ENCOUNTER — Telehealth: Payer: Self-pay

## 2023-02-11 ENCOUNTER — Emergency Department (HOSPITAL_COMMUNITY): Payer: Medicaid Other

## 2023-02-11 ENCOUNTER — Other Ambulatory Visit: Payer: Self-pay

## 2023-02-11 DIAGNOSIS — Z8249 Family history of ischemic heart disease and other diseases of the circulatory system: Secondary | ICD-10-CM

## 2023-02-11 DIAGNOSIS — Z9981 Dependence on supplemental oxygen: Secondary | ICD-10-CM

## 2023-02-11 DIAGNOSIS — L989 Disorder of the skin and subcutaneous tissue, unspecified: Secondary | ICD-10-CM | POA: Diagnosis present

## 2023-02-11 DIAGNOSIS — E1142 Type 2 diabetes mellitus with diabetic polyneuropathy: Secondary | ICD-10-CM | POA: Diagnosis present

## 2023-02-11 DIAGNOSIS — Z7984 Long term (current) use of oral hypoglycemic drugs: Secondary | ICD-10-CM

## 2023-02-11 DIAGNOSIS — D539 Nutritional anemia, unspecified: Secondary | ICD-10-CM | POA: Diagnosis present

## 2023-02-11 DIAGNOSIS — E8809 Other disorders of plasma-protein metabolism, not elsewhere classified: Secondary | ICD-10-CM | POA: Diagnosis present

## 2023-02-11 DIAGNOSIS — E785 Hyperlipidemia, unspecified: Secondary | ICD-10-CM | POA: Diagnosis present

## 2023-02-11 DIAGNOSIS — J9621 Acute and chronic respiratory failure with hypoxia: Secondary | ICD-10-CM | POA: Diagnosis not present

## 2023-02-11 DIAGNOSIS — D696 Thrombocytopenia, unspecified: Secondary | ICD-10-CM | POA: Diagnosis present

## 2023-02-11 DIAGNOSIS — J84112 Idiopathic pulmonary fibrosis: Principal | ICD-10-CM | POA: Diagnosis present

## 2023-02-11 DIAGNOSIS — D6959 Other secondary thrombocytopenia: Secondary | ICD-10-CM | POA: Diagnosis present

## 2023-02-11 DIAGNOSIS — T380X5A Adverse effect of glucocorticoids and synthetic analogues, initial encounter: Secondary | ICD-10-CM | POA: Diagnosis not present

## 2023-02-11 DIAGNOSIS — R12 Heartburn: Secondary | ICD-10-CM | POA: Diagnosis present

## 2023-02-11 DIAGNOSIS — E1165 Type 2 diabetes mellitus with hyperglycemia: Secondary | ICD-10-CM | POA: Diagnosis not present

## 2023-02-11 DIAGNOSIS — J189 Pneumonia, unspecified organism: Secondary | ICD-10-CM | POA: Diagnosis present

## 2023-02-11 DIAGNOSIS — D638 Anemia in other chronic diseases classified elsewhere: Secondary | ICD-10-CM | POA: Diagnosis present

## 2023-02-11 DIAGNOSIS — Z7952 Long term (current) use of systemic steroids: Secondary | ICD-10-CM

## 2023-02-11 DIAGNOSIS — R0602 Shortness of breath: Principal | ICD-10-CM

## 2023-02-11 DIAGNOSIS — Z91014 Allergy to mammalian meats: Secondary | ICD-10-CM

## 2023-02-11 DIAGNOSIS — Z1152 Encounter for screening for COVID-19: Secondary | ICD-10-CM

## 2023-02-11 DIAGNOSIS — D801 Nonfamilial hypogammaglobulinemia: Secondary | ICD-10-CM | POA: Diagnosis present

## 2023-02-11 DIAGNOSIS — J8489 Other specified interstitial pulmonary diseases: Secondary | ICD-10-CM | POA: Diagnosis present

## 2023-02-11 DIAGNOSIS — K7460 Unspecified cirrhosis of liver: Secondary | ICD-10-CM | POA: Diagnosis present

## 2023-02-11 DIAGNOSIS — J849 Interstitial pulmonary disease, unspecified: Secondary | ICD-10-CM | POA: Diagnosis present

## 2023-02-11 DIAGNOSIS — K219 Gastro-esophageal reflux disease without esophagitis: Secondary | ICD-10-CM | POA: Diagnosis present

## 2023-02-11 DIAGNOSIS — Z79899 Other long term (current) drug therapy: Secondary | ICD-10-CM

## 2023-02-11 DIAGNOSIS — R188 Other ascites: Secondary | ICD-10-CM | POA: Diagnosis present

## 2023-02-11 DIAGNOSIS — E119 Type 2 diabetes mellitus without complications: Secondary | ICD-10-CM

## 2023-02-11 DIAGNOSIS — D892 Hypergammaglobulinemia, unspecified: Secondary | ICD-10-CM | POA: Diagnosis present

## 2023-02-11 DIAGNOSIS — Z8679 Personal history of other diseases of the circulatory system: Secondary | ICD-10-CM

## 2023-02-11 DIAGNOSIS — Z7951 Long term (current) use of inhaled steroids: Secondary | ICD-10-CM

## 2023-02-11 DIAGNOSIS — J440 Chronic obstructive pulmonary disease with acute lower respiratory infection: Secondary | ICD-10-CM | POA: Diagnosis present

## 2023-02-11 DIAGNOSIS — Z8673 Personal history of transient ischemic attack (TIA), and cerebral infarction without residual deficits: Secondary | ICD-10-CM

## 2023-02-11 LAB — CBC
HCT: 28.7 % — ABNORMAL LOW (ref 39.0–52.0)
Hemoglobin: 9.3 g/dL — ABNORMAL LOW (ref 13.0–17.0)
MCH: 37.1 pg — ABNORMAL HIGH (ref 26.0–34.0)
MCHC: 32.4 g/dL (ref 30.0–36.0)
MCV: 114.3 fL — ABNORMAL HIGH (ref 80.0–100.0)
Platelets: 128 10*3/uL — ABNORMAL LOW (ref 150–400)
RBC: 2.51 MIL/uL — ABNORMAL LOW (ref 4.22–5.81)
RDW: 15.6 % — ABNORMAL HIGH (ref 11.5–15.5)
WBC: 9.7 10*3/uL (ref 4.0–10.5)
nRBC: 0 % (ref 0.0–0.2)

## 2023-02-11 LAB — COMPREHENSIVE METABOLIC PANEL
ALT: 37 U/L (ref 0–44)
AST: 67 U/L — ABNORMAL HIGH (ref 15–41)
Albumin: <1.5 g/dL — ABNORMAL LOW (ref 3.5–5.0)
Alkaline Phosphatase: 245 U/L — ABNORMAL HIGH (ref 38–126)
Anion gap: 10 (ref 5–15)
BUN: 28 mg/dL — ABNORMAL HIGH (ref 6–20)
CO2: 22 mmol/L (ref 22–32)
Calcium: 8.3 mg/dL — ABNORMAL LOW (ref 8.9–10.3)
Chloride: 108 mmol/L (ref 98–111)
Creatinine, Ser: 0.93 mg/dL (ref 0.61–1.24)
GFR, Estimated: >60 mL/min (ref >60)
Glucose, Bld: 129 mg/dL — ABNORMAL HIGH (ref 70–99)
Potassium: 3.5 mmol/L (ref 3.5–5.1)
Sodium: 140 mmol/L (ref 135–145)
Total Bilirubin: 1.4 mg/dL — ABNORMAL HIGH (ref 0.0–1.2)
Total Protein: 7.8 g/dL (ref 6.5–8.1)

## 2023-02-11 LAB — RESP PANEL BY RT-PCR (RSV, FLU A&B, COVID)  RVPGX2
Influenza A by PCR: NEGATIVE
Influenza B by PCR: NEGATIVE
Resp Syncytial Virus by PCR: NEGATIVE
SARS Coronavirus 2 by RT PCR: NEGATIVE

## 2023-02-11 LAB — TROPONIN I (HIGH SENSITIVITY): Troponin I (High Sensitivity): 11 ng/L (ref <18)

## 2023-02-11 LAB — BRAIN NATRIURETIC PEPTIDE: B Natriuretic Peptide: 101.4 pg/mL — ABNORMAL HIGH (ref 0.0–100.0)

## 2023-02-11 MED ORDER — MAGNESIUM SULFATE 2 GM/50ML IV SOLN
2.0000 g | Freq: Once | INTRAVENOUS | Status: AC
  Administered 2023-02-11: 2 g via INTRAVENOUS
  Filled 2023-02-11: qty 50

## 2023-02-11 MED ORDER — IPRATROPIUM-ALBUTEROL 0.5-2.5 (3) MG/3ML IN SOLN
3.0000 mL | Freq: Once | RESPIRATORY_TRACT | Status: AC
  Administered 2023-02-11: 3 mL via RESPIRATORY_TRACT
  Filled 2023-02-11: qty 3

## 2023-02-11 MED ORDER — IOHEXOL 350 MG/ML SOLN
75.0000 mL | Freq: Once | INTRAVENOUS | Status: AC | PRN
  Administered 2023-02-11: 75 mL via INTRAVENOUS

## 2023-02-11 MED ORDER — METHYLPREDNISOLONE SODIUM SUCC 125 MG IJ SOLR: 125.0000 mg | Freq: Once | INTRAMUSCULAR | Status: DC
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
## 2023-02-11 NOTE — ED Notes (Signed)
Pt to CT

## 2023-02-11 NOTE — Telephone Encounter (Signed)
Auth Submission: NO AUTH NEEDED Site of care: Site of care: [facility] Payer: [employer] medicaid Medication & CPT/J Code(s) submitted: Truxima (Rituximab-abbs) [phone] Route of submission (phone, fax, portal): phone Phone #[phone] Fax # Auth type: Buy/Bill PB Units/visits requested: 1000mg  iv q6 months Reference number: [id number] Approval from: 12/25/22 to 01/21/24

## 2023-02-11 NOTE — Telephone Encounter (Signed)
I called and spoke with the pt's daughter and scheduled pt for acute visit with Dr Celine Mans for tomorrow am  Nothing further needed

## 2023-02-11 NOTE — Telephone Encounter (Signed)
Called patient - spoke to his daughter, Geanie Logan - DOB/NEED Updated DPR verified - stated the following:  Patient seen @ [facility] ER on 02/02/23 - Dx: COPD Exacerbation cc: cough  Rx: Prednisone (Short Burst).  COVID Test: Negative Influenza A/B: Negative RSV: Negative  Daughter stated since 02/08/23 patient has declined significantly:  VERY malaise  More short of breath than usual - even at rest.  Daughter stated Pulmonology called  - schedule for patient to be seen in the morning - 02/12/23 but she is very worried and concerned about his health.  Daughter was advised to take patient back to nearest ER to be reassessed due to his symptoms and decline since being seen at ER on 02/02/23.  Daughter advised message would be forwarded to provider as update.  Daughter verbalized understanding to all - stated her father was very stubborn - advised to call 911 if needed - they would assessed him and transport hi to nearest ER if needed.

## 2023-02-11 NOTE — H&P (Incomplete)
History and Physical    [name] UXL:[id number] DOB: [birth year] DOA: 02/11/2023  PCP: Kalman Shan, MD  Patient coming from: Home  I have personally briefly reviewed patient's old medical records in [facility]  Chief Complaint: Shortness of breath  HPI: Richard Davenport is a 63 y.o. male with medical history significant for ILD/pulmonary fibrosis, chronic hypoxic respiratory failure on 5 L O2 [place], Hx of SAH s/p aneurysm coiling, hepatic cirrhosis, HLD, anemia of chronic disease, chronic thrombocytopenia who presents to the ED for evaluation of shortness of breath.  Patient has known ILD/pulmonary fibrosis follows with pulmonology Dr. Marchelle Gearing locally and Dr. Josph Macho with [facility].  He is on 5 L of home O2 via [place] at baseline.  Patient and family states that he was doing well 1 week ago, going to the grocery store and ambulating fine on his home O2.  Over the last few days he has had progressively increasing shortness of breath and tachypnea.  He has a chronic cough at baseline but has had increased frequency his last few days.  He states that he has a lot of chest congestion but no sputum production with cough.  He reports coughing after he eats but denies any choking sensation or feeling like he has aspirated.  He reported subjective fevers at home day prior to admission.  Patient states that he is only using albuterol rescue inhaler infrequently when needed.  He says he is not using any maintenance nebulizers.  EMS were called and per ED triage documentation he was given Solu-Medrol 125 mg, albuterol, DuoNeb, and Atrovent treatments prior to arrival.  ED Course  Labs/Imaging on admission: I have personally reviewed following labs and imaging studies.  Initial vitals showed BP 104/65, pulse 131, RR 40, temp 98.7 F, SpO2 96% on 5 L O2 via [place].  Labs showed sodium 140, potassium 3.5, bicarb 22, BUN 28, creatinine 0.93, serum glucose 129, albumin <1.5, AST 67, ALT 37,  alk phos 245, total bilirubin 1.4, WBC 9.7, hemoglobin 9.3, platelets 128,000, BNP 101.4.  Troponin 11.  SARS-CoV-2, influenza, RSV PCR negative.  CTA chest negative for evidence of PE.  Low lung volumes with changes of severe fibrosis throughout.  No definite acute process.  Borderline cardiomegaly.  Cirrhosis with upper abdominal ascites.  Patient was given DuoNebs, IV magnesium 2 g.  EDP spoke with PCCM who recommended medical admission.  The hospitalist service was consulted to admit for further evaluation management.  Review of Systems: All systems reviewed and are negative except as documented in history of present illness above.   Past Medical History:  Diagnosis Date   Asthma    Diabetes mellitus without complication (HCC)    Headache    Idiopathic pulmonary fibrosis (HCC)    Stroke (HCC)    Subarachnoid hemorrhage (HCC)     Past Surgical History:  Procedure Laterality Date   ANEURYSM COILING     for bleed   LOOP RECORDER INSERTION N/A 04/10/2018   Procedure: LOOP RECORDER INSERTION;  Surgeon: [facility] Salvia, MD;  Location: [facility] INVASIVE CV LAB;  Service: Cardiovascular;  Laterality: N/A;   RADIOLOGY WITH ANESTHESIA N/A 10/09/2012   Procedure: RADIOLOGY WITH ANESTHESIA;  Surgeon: Lisbeth Renshaw, MD;  Location: [facility] OR;  Service: Radiology;  Laterality: N/A;   RADIOLOGY WITH ANESTHESIA N/A 05/27/2013   Procedure: RADIOLOGY WITH ANESTHESIA;  Surgeon: Lisbeth Renshaw, MD;  Location: [facility] OR;  Service: Radiology;  Laterality: N/A;    Social History:  reports that he  has never smoked. He has been exposed to tobacco smoke. He has never used smokeless tobacco. He reports that he does not drink alcohol and does not use drugs.  Allergies  Allergen Reactions   Pork-Derived Products Other (See Comments)    Patient is Muslim and PREFERS TO NOT TAKE ANY PORK OR MEAT PRODUCTS (only fish)    Family History  Problem Relation Age of Onset   Hypertension Mother    Hypertension Father       Prior to Admission medications   Medication Sig Start Date End Date Taking? Authorizing Provider  albuterol (PROVENTIL) (2.5 MG/3ML) 0.083% nebulizer solution Take 3 mLs (2.5 mg total) by nebulization every 4 (four) hours as needed for wheezing or shortness of breath. 01/07/23   Kozlow, Alvira Philips, MD  albuterol (VENTOLIN HFA) 108 (90 Base) MCG/ACT inhaler Inhale 2 puffs into the lungs every 6 (six) hours as needed for wheezing or shortness of breath. 01/07/23   Kozlow, Alvira Philips, MD  budesonide (PULMICORT) 0.5 MG/2ML nebulizer solution Take 2 mLs (0.5 mg total) by nebulization in the morning, at noon, in the evening, and at bedtime. 01/07/23   Kozlow, Alvira Philips, MD  dextromethorphan (DELSYM) 30 MG/5ML liquid Take 30 mg by mouth 2 (two) times daily as needed for cough.    [provider]  furosemide (LASIX) 40 MG tablet Take 0.5 tablets (20 mg total) by mouth daily. 05/20/22 05/20/23  Arnetha Courser, MD  gabapentin (NEURONTIN) 300 MG capsule Take 300 mg by mouth 3 (three) times daily. 08/28/22   [provider]  metFORMIN (GLUCOPHAGE) 500 MG tablet Take 1 tablet (500 mg total) by mouth 2 (two) times daily. Patient taking differently: Take 500 mg by mouth daily. 12/14/17   Linwood Dibbles, MD  mometasone (ELOCON) 0.1 % ointment Apply topically daily. 1 application to left ear once daily. 01/07/23   Kozlow, Alvira Philips, MD  omeprazole (PRILOSEC) 20 MG capsule Take 20 mg by mouth daily.    [provider]  Pirfenidone 267 MG TABS Take 3 tablets (801 mg total) by mouth with breakfast, with lunch, and with evening meal. Month 2 and onwards Patient taking differently: Take 534 mg by mouth with breakfast, with lunch, and with evening meal. 03/13/22   Olalere, Adewale A, MD  riTUXimab (RITUXAN IV) Inject 1 Dose into the vein every 6 (six) months. Unknown dosage    [provider]  spironolactone (ALDACTONE) 50 MG tablet Take 50 mg by mouth daily. 05/06/22 05/06/23  [provider]     Physical Exam: Vitals:   02/11/23 2130 02/11/23 2230 02/11/23 2300 02/12/23 0000  BP: (!) 102/59 107/73 105/80 118/76  Pulse: (!) 128 (!) 119 (!) 122 (!) 119  Resp: (!) 43 (!) 21 (!) 42 (!) 38  Temp:      TempSrc:      SpO2: 100% 97% (!) 88% 96%  Weight:      Height:       Constitutional: Chronically ill-appearing thin man resting in bed with head elevated Eyes: EOMI, lids and conjunctivae normal ENMT: Mucous membranes are moist. Posterior pharynx clear of any exudate or lesions.Normal dentition.  Neck: normal, supple, no masses. Respiratory: Fine inspiratory crackles throughout with faint end expiratory wheezing.  Increased respiratory effort while on 5 L O2 via [place].  Cardiovascular: Tachycardic, no murmurs / rubs / gallops. No extremity edema. 2+ pedal pulses. Abdomen: no tenderness, no masses palpated.  Musculoskeletal: no clubbing / cyanosis. No joint deformity upper and lower extremities.  Good ROM, no contractures. Normal muscle tone.  Thin extremities. Skin: no rashes, lesions, ulcers. No induration Neurologic: Sensation intact. Strength 5/5 in all 4.  Psychiatric:  Alert and oriented x 3. Normal mood.   EKG: Personally reviewed. Sinus tachycardia, rate 124, late R wave transition.  LVH.  Rate is slower when compared to prior.  Assessment/Plan Principal Problem:   Acute on chronic respiratory failure with hypoxia (HCC) Active Problems:   Thrombocytopenia (HCC)   Cirrhosis (HCC)   Lung disease, interstitial (HCC)   Type 2 diabetes mellitus (HCC)   Richard Davenport is a 63 y.o. male with medical history significant for ILD/pulmonary fibrosis, chronic hypoxic respiratory failure on 5 L O2 [place], Hx of SAH s/p aneurysm coiling, hepatic cirrhosis, HLD, anemia of chronic disease, chronic thrombocytopenia who is admitted for acute on chronic hypoxic respiratory failure in setting of ILD/pulmonary fibrosis.  Assessment and Plan: Acute on chronic hypoxic respiratory failure due  to ILD/pulmonary fibrosis: On 5 L O2 via [place] at baseline, SpO2 was 83% at home with EMS while on his home O2.  Remains tachypneic but saturating better now on 5 L [place].  CTA negative for PE, severe fibrosis changes noted.  Patient states he is not using maintenance inhalers at home.  On pirfenidone and rituximab through [facility]. -IV Solu-Medrol 40 mg twice daily -Brovana/Pulmicort BID, DuoNebs as needed -Start azithromycin -Continue supplemental oxygen at baseline 5 L [place] -BiPAP if needed -Check respiratory panel  Hepatic cirrhosis: Stable, mentating well on admission.  Continue home Lasix/spironolactone.  Anemia of chronic disease: Hemoglobin stable at 9.3.  Chronic thrombocytopenia: Mild without obvious bleeding in setting of cirrhosis.  Continue to monitor.  Type 2 diabetes: Well-controlled with last hemoglobin A1c 4.9% 12/20/2022.  Holding metformin.  Placed on SSI while on steroids.   DVT prophylaxis: SCDs Start: 02/12/23 0006 Code Status: Full code, discussed with patient on admission Family Communication: 2 daughters at bedside Disposition Plan: From home, dispo pending clinical progress Consults called: None Severity of Illness: The appropriate patient status for this patient is INPATIENT. Inpatient status is judged to be reasonable and necessary in order to provide the required intensity of service to ensure the patient's safety. The patient's presenting symptoms, physical exam findings, and initial radiographic and laboratory data in the context of their chronic comorbidities is felt to place them at high risk for further clinical deterioration. Furthermore, it is not anticipated that the patient will be medically stable for discharge from the hospital within 2 midnights of admission.   * I certify that at the point of admission it is my clinical judgment that the patient will require inpatient hospital care spanning beyond 2 midnights from the point of admission due to high intensity  of service, high risk for further deterioration and high frequency of surveillance required.Darreld Mclean MD [facility]  If 7PM-7AM, please contact night-coverage [email or web address]  02/12/2023, 12:36 AM

## 2023-02-11 NOTE — ED Provider Notes (Signed)
[facility] AT [facility] Provider Note   CSN: [id number] Arrival date & time: 02/11/23  1819     History  Chief Complaint  Patient presents with   Shortness of Breath   HPI Richard Davenport is a 63 y.o. 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  Started 4 days ago.  It is worse with exertion.  Denies associated chest pain.  Denies any lower extremity edema, calf swelling or tenderness and recent mobilization.  Per EMS patient had expiratory wheezing in the upper lobes and crackles in the lower lobes.  He was given albuterol, Solu-Medrol, DuoNeb and Atrovent and route.  States he is also had a cough which has been nonproductive.  He usually has a cough with his ILD but states it has been more persistent.  Also endorses subjective fever at home.   Shortness of Breath      Home Medications Prior to Admission medications   Medication Sig Start Date End Date Taking? Authorizing Provider  albuterol (PROVENTIL) (2.5 MG/3ML) 0.083% nebulizer solution Take 3 mLs (2.5 mg total) by nebulization every 4 (four) hours as needed for wheezing or shortness of breath. 01/07/23   Kozlow, Alvira Philips, MD  albuterol (VENTOLIN HFA) 108 (90 Base) MCG/ACT inhaler Inhale 2 puffs into the lungs every 6 (six) hours as needed for wheezing or shortness of breath. 01/07/23   Kozlow, Alvira Philips, MD  budesonide (PULMICORT) 0.5 MG/2ML nebulizer solution Take 2 mLs (0.5 mg total) by nebulization in the morning, at noon, in the evening, and at bedtime. 01/07/23   Kozlow, Alvira Philips, MD  dextromethorphan (DELSYM) 30 MG/5ML liquid Take 30 mg by mouth 2 (two) times daily as needed for cough.    [provider]  furosemide (LASIX) 40 MG tablet Take 0.5 tablets (20 mg total) by mouth daily. 05/20/22 05/20/23  Arnetha Courser, MD  gabapentin (NEURONTIN) 300 MG capsule Take 300 mg by mouth 3 (three) times daily. 08/28/22    [provider]  metFORMIN (GLUCOPHAGE) 500 MG tablet Take 1 tablet (500 mg total) by mouth 2 (two) times daily. Patient taking differently: Take 500 mg by mouth daily. 12/14/17   Linwood Dibbles, MD  mometasone (ELOCON) 0.1 % ointment Apply topically daily. 1 application to left ear once daily. 01/07/23   Kozlow, Alvira Philips, MD  omeprazole (PRILOSEC) 20 MG capsule Take 20 mg by mouth daily.    [provider]  Pirfenidone 267 MG TABS Take 3 tablets (801 mg total) by mouth with breakfast, with lunch, and with evening meal. Month 2 and onwards Patient taking differently: Take 534 mg by mouth with breakfast, with lunch, and with evening meal. 03/13/22   Olalere, Adewale A, MD  riTUXimab (RITUXAN IV) Inject 1 Dose into the vein every 6 (six) months. Unknown dosage    [provider]  spironolactone (ALDACTONE) 50 MG tablet Take 50 mg by mouth daily. 05/06/22 05/06/23  [provider]      Allergies    Pork-derived products    Review of Systems   Review of Systems  Respiratory:  Positive for shortness of breath.     Physical Exam   Vitals:   02/11/23 2230 02/11/23 2300  BP: 107/73 105/80  Pulse: (!) 119 (!) 122  Resp: (!) 21 (!) 42  Temp:    SpO2: 97% (!) 88%    CONSTITUTIONAL:  well-appearing, NAD NEURO:  Alert and oriented  x 3, CN 3-12 grossly intact EYES:  eyes equal and reactive ENT/NECK:  Supple, no stridor  CARDIO: Tachycardic and regular  rhythm, appears well-perfused  PULM: Tachypneic, sounds are diminished diffusely, expiratory wheezing noted upper lung fields greater than lower, on 5 L nasal cannula GI/GU:  non-distended, soft MSK/SPINE:  No gross deformities, no edema, moves all extremities  SKIN:  no rash, atraumatic  *Additional and/or pertinent findings included in MDM below  ED Results / Procedures / Treatments   Labs (all labs ordered are listed, but only abnormal results are displayed) Labs Reviewed  COMPREHENSIVE METABOLIC PANEL -  Abnormal; Notable for the following components:      Result Value   Glucose, Bld 129 (*)    BUN 28 (*)    Calcium 8.3 (*)    Albumin <1.5 (*)    AST 67 (*)    Alkaline Phosphatase 245 (*)    Total Bilirubin 1.4 (*)    All other components within normal limits  CBC - Abnormal; Notable for the following components:   RBC 2.51 (*)    Hemoglobin 9.3 (*)    HCT 28.7 (*)    MCV 114.3 (*)    MCH 37.1 (*)    RDW 15.6 (*)    Platelets 128 (*)    All other components within normal limits  BRAIN NATRIURETIC PEPTIDE - Abnormal; Notable for the following components:   B Natriuretic Peptide 101.4 (*)    All other components within normal limits  RESP PANEL BY RT-PCR (RSV, FLU A&B, COVID)  RVPGX2  TROPONIN I (HIGH SENSITIVITY)  TROPONIN I (HIGH SENSITIVITY)    EKG None  Radiology CT ANGIO CHEST PE W OR WO CONTRAST Result Date: 02/11/2023 CLINICAL DATA:  Pulmonary embolism (PE) suspected, high prob. Shortness of breath. EXAM: CT ANGIOGRAPHY CHEST WITH CONTRAST TECHNIQUE: Multidetector CT imaging of the chest was performed using the standard protocol during bolus administration of intravenous contrast. Multiplanar CT image reconstructions and MIPs were obtained to evaluate the vascular anatomy. RADIATION DOSE REDUCTION: This exam was performed according to the departmental dose-optimization program which includes automated exposure control, adjustment of the mA and/or kV according to patient size and/or use of iterative reconstruction technique. CONTRAST:  75mL OMNIPAQUE IOHEXOL 350 MG/ML SOLN COMPARISON:  05/19/2022 FINDINGS: Cardiovascular: No filling defects in the pulmonary arteries to suggest pulmonary emboli. Heart borderline in size. Aorta normal caliber. Mediastinum/Nodes: No mediastinal, hilar, or axillary adenopathy. Trachea and esophagus are unremarkable. Thyroid unremarkable. Lungs/Pleura: Changes of fibrosis throughout the lungs, similar to prior study. No definite acute process. No  effusions. Upper Abdomen: Cirrhosis.  Large volume upper abdominal ascites. Musculoskeletal: Bilateral gynecomastia.  No acute bony abnormality. Review of the MIP images confirms the above findings. IMPRESSION: No evidence of pulmonary embolus. Very low lung volumes with changes of severe fibrosis. No definite acute process. Borderline cardiomegaly. Cirrhosis, large volume upper abdominal ascites. Electronically Signed   By: Charlett Nose M.D.   On: 02/11/2023 22:06   DG Chest Port 1 View Result Date: 02/11/2023 CLINICAL DATA:  Shortness of breath EXAM: PORTABLE CHEST 1 VIEW COMPARISON:  02/02/2023 FINDINGS: Heart and mediastinal contours are stable. Very low lung volumes with chronic fibrotic changes throughout the lungs. No definite acute process or effusions. No acute bony abnormality. IMPRESSION: Very low lung volumes with stable chronic fibrosis. No definite acute process. Electronically Signed   By: Charlett Nose M.D.   On: 02/11/2023 18:50    Procedures Procedures    Medications Ordered in  ED Medications  ipratropium-albuterol (DUONEB) 0.5-2.5 (3) MG/3ML nebulizer solution 3 mL (3 mLs Nebulization Given 02/11/23 2024)  magnesium sulfate IVPB 2 g 50 mL (2 g Intravenous New Bag/Given 02/11/23 2034)  ipratropium-albuterol (DUONEB) 0.5-2.5 (3) MG/3ML nebulizer solution 3 mL (3 mLs Nebulization Given 02/11/23 2025)  iohexol (OMNIPAQUE) 350 MG/ML injection 75 mL (75 mLs Intravenous Contrast Given 02/11/23 2151)    ED Course/ Medical Decision Making/ A&P Clinical Course as of 02/11/23 2323  Tue Feb 11, 2023  2242 For pulm, steroids? [JR]  2251 Treat like copd, steroids q6 [JR]  2309 RBC(!): 2.51 [JR]    Clinical Course User Index [JR] Gareth Eagle, PA-C                                 Medical Decision Making Amount and/or Complexity of Data Reviewed Labs: ordered. Radiology: ordered.  Risk Prescription drug management.   Initial Impression and Ddx 63 year old well appearing male  presenting for shortness of breath. Exam notable for tachypnea and tachycardia on 5 L nasal cannula (his home regiment).  DDx includes PE, COPD exacerbation, pneumonia, CHF exacerbation, other. Patient PMH that increases complexity of ED encounter:  ILD on 5 L at baseline, hyperlipidemia, diabetes, TIA, SAH, and cirrhosis  Interpretation of Diagnostics - I independent reviewed and interpreted the labs as followed: anemia (slightly improved from baseline), elevated AP and total bili, thrombocytopenia  - I independently visualized the following imaging with scope of interpretation limited to determining acute life threatening conditions related to emergency care: CT angio PE, which revealed negative  - I personally reviewed and interpreted EKG which revealed sinus tachycardia  Patient Reassessment and Ultimate Disposition/Management On reassessment, patient was maintaining sats on 5 L but was still notably tachypneic into the 40s and stated that his shortness of breath was still unchanged.  Discussed patient with Dr. Lonzo Candy who advised continue treatment for COPD exacerbation and admission to hospital service.  Advised that he will be available in consultation.  Workup does not suggest PE or CHF at this time.  Admitted to hospital service with Dr. Allena Katz.  Patient still tachypneic but otherwise stable on 5 L.  Patient management required discussion with the following services or consulting groups:  Hospitalist Service, Pulmonolgy  Complexity of Problems Addressed Acute complicated illness or Injury  Additional Data Reviewed and Analyzed Further history obtained from: Further history from spouse/family member, Past medical history and medications listed in the EMR, and Prior ED visit notes  Patient Encounter Risk Assessment Consideration of hospitalization         Final Clinical Impression(s) / ED Diagnoses Final diagnoses:  SOB (shortness of breath)    Rx / DC Orders ED  Discharge Orders     None         Gareth Eagle, PA-C 02/11/23 2323    Glyn Ade, MD 02/15/23 [phone]

## 2023-02-12 ENCOUNTER — Ambulatory Visit: Payer: Medicaid Other | Admitting: Internal Medicine

## 2023-02-12 DIAGNOSIS — J440 Chronic obstructive pulmonary disease with acute lower respiratory infection: Secondary | ICD-10-CM | POA: Diagnosis present

## 2023-02-12 DIAGNOSIS — D801 Nonfamilial hypogammaglobulinemia: Secondary | ICD-10-CM | POA: Diagnosis present

## 2023-02-12 DIAGNOSIS — Z515 Encounter for palliative care: Secondary | ICD-10-CM | POA: Diagnosis not present

## 2023-02-12 DIAGNOSIS — D892 Hypergammaglobulinemia, unspecified: Secondary | ICD-10-CM | POA: Diagnosis present

## 2023-02-12 DIAGNOSIS — Z8249 Family history of ischemic heart disease and other diseases of the circulatory system: Secondary | ICD-10-CM | POA: Diagnosis not present

## 2023-02-12 DIAGNOSIS — J8489 Other specified interstitial pulmonary diseases: Secondary | ICD-10-CM | POA: Diagnosis present

## 2023-02-12 DIAGNOSIS — D539 Nutritional anemia, unspecified: Secondary | ICD-10-CM | POA: Diagnosis present

## 2023-02-12 DIAGNOSIS — J189 Pneumonia, unspecified organism: Secondary | ICD-10-CM | POA: Diagnosis present

## 2023-02-12 DIAGNOSIS — Z79899 Other long term (current) drug therapy: Secondary | ICD-10-CM | POA: Diagnosis not present

## 2023-02-12 DIAGNOSIS — Z7984 Long term (current) use of oral hypoglycemic drugs: Secondary | ICD-10-CM | POA: Diagnosis not present

## 2023-02-12 DIAGNOSIS — J849 Interstitial pulmonary disease, unspecified: Secondary | ICD-10-CM

## 2023-02-12 DIAGNOSIS — Z8673 Personal history of transient ischemic attack (TIA), and cerebral infarction without residual deficits: Secondary | ICD-10-CM | POA: Diagnosis not present

## 2023-02-12 DIAGNOSIS — E8809 Other disorders of plasma-protein metabolism, not elsewhere classified: Secondary | ICD-10-CM | POA: Diagnosis present

## 2023-02-12 DIAGNOSIS — E785 Hyperlipidemia, unspecified: Secondary | ICD-10-CM | POA: Diagnosis present

## 2023-02-12 DIAGNOSIS — E1142 Type 2 diabetes mellitus with diabetic polyneuropathy: Secondary | ICD-10-CM | POA: Diagnosis present

## 2023-02-12 DIAGNOSIS — J9621 Acute and chronic respiratory failure with hypoxia: Secondary | ICD-10-CM | POA: Diagnosis present

## 2023-02-12 DIAGNOSIS — J84112 Idiopathic pulmonary fibrosis: Secondary | ICD-10-CM | POA: Diagnosis present

## 2023-02-12 DIAGNOSIS — Z1152 Encounter for screening for COVID-19: Secondary | ICD-10-CM | POA: Diagnosis not present

## 2023-02-12 DIAGNOSIS — E1165 Type 2 diabetes mellitus with hyperglycemia: Secondary | ICD-10-CM | POA: Diagnosis not present

## 2023-02-12 DIAGNOSIS — Z7189 Other specified counseling: Secondary | ICD-10-CM | POA: Diagnosis not present

## 2023-02-12 DIAGNOSIS — D6959 Other secondary thrombocytopenia: Secondary | ICD-10-CM | POA: Diagnosis present

## 2023-02-12 DIAGNOSIS — T380X5A Adverse effect of glucocorticoids and synthetic analogues, initial encounter: Secondary | ICD-10-CM | POA: Diagnosis not present

## 2023-02-12 DIAGNOSIS — E119 Type 2 diabetes mellitus without complications: Secondary | ICD-10-CM

## 2023-02-12 DIAGNOSIS — R188 Other ascites: Secondary | ICD-10-CM | POA: Diagnosis present

## 2023-02-12 DIAGNOSIS — K7460 Unspecified cirrhosis of liver: Secondary | ICD-10-CM | POA: Diagnosis present

## 2023-02-12 DIAGNOSIS — D696 Thrombocytopenia, unspecified: Secondary | ICD-10-CM | POA: Diagnosis not present

## 2023-02-12 DIAGNOSIS — D638 Anemia in other chronic diseases classified elsewhere: Secondary | ICD-10-CM | POA: Diagnosis present

## 2023-02-12 DIAGNOSIS — Z9981 Dependence on supplemental oxygen: Secondary | ICD-10-CM | POA: Diagnosis not present

## 2023-02-12 LAB — RESPIRATORY PANEL BY PCR

## 2023-02-12 LAB — CBC
HCT: 27.2 % — ABNORMAL LOW (ref 39.0–52.0)
Hemoglobin: 8.5 g/dL — ABNORMAL LOW (ref 13.0–17.0)
MCH: 36.2 pg — ABNORMAL HIGH (ref 26.0–34.0)
MCHC: 31.3 g/dL (ref 30.0–36.0)
MCV: 115.7 fL — ABNORMAL HIGH (ref 80.0–100.0)
Platelets: 109 10*3/uL — ABNORMAL LOW (ref 150–400)
RBC: 2.35 MIL/uL — ABNORMAL LOW (ref 4.22–5.81)
RDW: 15.7 % — ABNORMAL HIGH (ref 11.5–15.5)
WBC: 6.3 10*3/uL (ref 4.0–10.5)
nRBC: 0 % (ref 0.0–0.2)

## 2023-02-12 LAB — COMPREHENSIVE METABOLIC PANEL
ALT: 40 U/L (ref 0–44)
AST: 74 U/L — ABNORMAL HIGH (ref 15–41)
Albumin: <1.5 g/dL — ABNORMAL LOW (ref 3.5–5.0)
Alkaline Phosphatase: 242 U/L — ABNORMAL HIGH (ref 38–126)
Anion gap: 14 (ref 5–15)
BUN: 32 mg/dL — ABNORMAL HIGH (ref 6–20)
CO2: 17 mmol/L — ABNORMAL LOW (ref 22–32)
Calcium: 8.7 mg/dL — ABNORMAL LOW (ref 8.9–10.3)
Chloride: 108 mmol/L (ref 98–111)
Creatinine, Ser: 1.18 mg/dL (ref 0.61–1.24)
GFR, Estimated: >60 mL/min (ref >60)
Glucose, Bld: 244 mg/dL — ABNORMAL HIGH (ref 70–99)
Potassium: 4.1 mmol/L (ref 3.5–5.1)
Sodium: 139 mmol/L (ref 135–145)
Total Bilirubin: 1.3 mg/dL — ABNORMAL HIGH (ref 0.0–1.2)
Total Protein: 7.8 g/dL (ref 6.5–8.1)

## 2023-02-12 LAB — TROPONIN I (HIGH SENSITIVITY): Troponin I (High Sensitivity): 13 ng/L (ref <18)

## 2023-02-12 LAB — CBG MONITORING, ED
Glucose-Capillary: 206 mg/dL — ABNORMAL HIGH (ref 70–99)
Glucose-Capillary: 202 mg/dL — ABNORMAL HIGH (ref 70–99)
Glucose-Capillary: 172 mg/dL — ABNORMAL HIGH (ref 70–99)

## 2023-02-12 LAB — GLUCOSE, CAPILLARY: Glucose-Capillary: 185 mg/dL — ABNORMAL HIGH (ref 70–99)

## 2023-02-12 MED ORDER — PIRFENIDONE 267 MG PO TABS
534.0000 mg | ORAL_TABLET | Freq: Three times a day (TID) | ORAL | Status: DC
  Administered 2023-02-13 – 2023-02-15 (×8): 534 mg via ORAL
  Filled 2023-02-12 (×10): qty 2

## 2023-02-12 MED ORDER — ACETAMINOPHEN 325 MG PO TABS: 650.0000 mg | ORAL_TABLET | Freq: Four times a day (QID) | ORAL | Status: DC | PRN

## 2023-02-12 MED ORDER — AZITHROMYCIN 250 MG PO TABS
500.0000 mg | ORAL_TABLET | Freq: Every day | ORAL | Status: DC
  Administered 2023-02-13 – 2023-02-15 (×3): 500 mg via ORAL
  Filled 2023-02-12 (×3): qty 2

## 2023-02-12 MED ORDER — PANTOPRAZOLE SODIUM 40 MG PO TBEC
40.0000 mg | DELAYED_RELEASE_TABLET | Freq: Every day | ORAL | Status: DC
Start: 2023-02-13 — End: 2023-02-15
  Administered 2023-02-13 – 2023-02-15 (×3): 40 mg via ORAL
  Filled 2023-02-12 (×3): qty 1

## 2023-02-12 MED ORDER — INSULIN ASPART 100 UNIT/ML IJ SOLN
0.0000 [IU] | Freq: Three times a day (TID) | INTRAMUSCULAR | Status: DC
  Administered 2023-02-12: 2 [IU] via SUBCUTANEOUS
  Administered 2023-02-12 (×2): 3 [IU] via SUBCUTANEOUS
  Administered 2023-02-13 (×2): 1 [IU] via SUBCUTANEOUS
  Administered 2023-02-13: 3 [IU] via SUBCUTANEOUS
  Administered 2023-02-14: 2 [IU] via SUBCUTANEOUS
  Administered 2023-02-14: 3 [IU] via SUBCUTANEOUS
  Administered 2023-02-14 – 2023-02-15 (×3): 2 [IU] via SUBCUTANEOUS

## 2023-02-12 MED ORDER — FUROSEMIDE 20 MG PO TABS
20.0000 mg | ORAL_TABLET | Freq: Every day | ORAL | Status: DC
  Administered 2023-02-12 – 2023-02-15 (×4): 20 mg via ORAL
  Filled 2023-02-12 (×4): qty 1

## 2023-02-12 MED ORDER — BUDESONIDE 0.25 MG/2ML IN SUSP
0.2500 mg | Freq: Two times a day (BID) | RESPIRATORY_TRACT | Status: DC
  Administered 2023-02-12 – 2023-02-15 (×7): 0.25 mg via RESPIRATORY_TRACT
  Filled 2023-02-12 (×7): qty 2

## 2023-02-12 MED ORDER — SENNOSIDES-DOCUSATE SODIUM 8.6-50 MG PO TABS: 1.0000 | ORAL_TABLET | Freq: Every evening | ORAL | Status: DC | PRN

## 2023-02-12 MED ORDER — METHYLPREDNISOLONE SODIUM SUCC 125 MG IJ SOLR
60.0000 mg | Freq: Two times a day (BID) | INTRAMUSCULAR | Status: DC
  Administered 2023-02-12 – 2023-02-15 (×7): 60 mg via INTRAVENOUS
  Filled 2023-02-12 (×7): qty 2

## 2023-02-12 MED ORDER — ACETAMINOPHEN 650 MG RE SUPP: 650.0000 mg | Freq: Four times a day (QID) | RECTAL | Status: DC | PRN

## 2023-02-12 MED ORDER — IPRATROPIUM-ALBUTEROL 0.5-2.5 (3) MG/3ML IN SOLN: 3.0000 mL | Freq: Four times a day (QID) | RESPIRATORY_TRACT | Status: DC | PRN

## 2023-02-12 MED ORDER — ADULT MULTIVITAMIN W/MINERALS CH
1.0000 | ORAL_TABLET | Freq: Every day | ORAL | Status: DC
  Administered 2023-02-13 – 2023-02-15 (×3): 1 via ORAL
  Filled 2023-02-12 (×3): qty 1

## 2023-02-12 MED ORDER — ENSURE ENLIVE PO LIQD
237.0000 mL | Freq: Two times a day (BID) | ORAL | Status: DC
  Administered 2023-02-13 – 2023-02-15 (×4): 237 mL via ORAL

## 2023-02-12 MED ORDER — ARFORMOTEROL TARTRATE 15 MCG/2ML IN NEBU
15.0000 ug | INHALATION_SOLUTION | Freq: Two times a day (BID) | RESPIRATORY_TRACT | Status: DC
  Administered 2023-02-12 – 2023-02-15 (×7): 15 ug via RESPIRATORY_TRACT
  Filled 2023-02-12 (×7): qty 2

## 2023-02-12 MED ORDER — SODIUM CHLORIDE 0.9% FLUSH
3.0000 mL | Freq: Two times a day (BID) | INTRAVENOUS | Status: DC
  Administered 2023-02-12 – 2023-02-15 (×7): 3 mL via INTRAVENOUS

## 2023-02-12 MED ORDER — SODIUM CHLORIDE 0.9 % IV SOLN
500.0000 mg | INTRAVENOUS | Status: AC
  Administered 2023-02-12: 500 mg via INTRAVENOUS
  Filled 2023-02-12: qty 5

## 2023-02-12 MED ORDER — ONDANSETRON HCL 4 MG PO TABS: 4.0000 mg | ORAL_TABLET | Freq: Four times a day (QID) | ORAL | Status: DC | PRN

## 2023-02-12 MED ORDER — SPIRONOLACTONE 25 MG PO TABS
50.0000 mg | ORAL_TABLET | Freq: Every day | ORAL | Status: DC
  Administered 2023-02-12 – 2023-02-15 (×4): 50 mg via ORAL
  Filled 2023-02-12 (×2): qty 2
  Filled 2023-02-12: qty 4
  Filled 2023-02-12: qty 2

## 2023-02-12 MED ORDER — ONDANSETRON HCL 4 MG/2ML IJ SOLN: 4.0000 mg | Freq: Four times a day (QID) | INTRAMUSCULAR | Status: DC | PRN

## 2023-02-12 MED ORDER — GUAIFENESIN ER 600 MG PO TB12
600.0000 mg | ORAL_TABLET | Freq: Two times a day (BID) | ORAL | Status: DC
  Administered 2023-02-12 – 2023-02-14 (×5): 600 mg via ORAL
  Filled 2023-02-12 (×5): qty 1

## 2023-02-12 MED ORDER — METHYLPREDNISOLONE SODIUM SUCC 125 MG IJ SOLR: 60.0000 mg | Freq: Two times a day (BID) | INTRAMUSCULAR | Status: DC

## 2023-02-12 MED ORDER — GABAPENTIN 300 MG PO CAPS
300.0000 mg | ORAL_CAPSULE | Freq: Three times a day (TID) | ORAL | Status: DC
  Administered 2023-02-12 – 2023-02-15 (×8): 300 mg via ORAL
  Filled 2023-02-12 (×8): qty 1

## 2023-02-12 NOTE — ED Notes (Signed)
Family called out reporting pt needed help using the urinal, RN entered and found pt trying to stand.  He was incredabaly SOB but refused to get back in bed.  His work of breathing was much worse than before.  RN helped pt w/ the urinal and got him back in bed.  RN discussed using the bipap and pt agreed.  RN contacted RT to place pt on bipap.

## 2023-02-12 NOTE — Hospital Course (Addendum)
Richard Davenport is a 63 y.o. male with medical history significant for ILD/pulmonary fibrosis, chronic hypoxic respiratory failure on 5 L O2 [place], Hx of SAH s/p aneurysm coiling, hepatic cirrhosis, HLD, anemia of chronic disease, chronic thrombocytopenia who is admitted for acute on chronic hypoxic respiratory failure in setting of ILD/pulmonary fibrosis.  He was consulted and medication recommendations have been made.  Unfortunately he continues to require quite a bit of oxygen and will need to ensure that his oxygen concentrator goes up to at least 10 L at home.  Will need to see his progress and continue to monitor his respiratory status carefully.  Home oxygen concentrator was delivered and he is on 8 L and okay for discharge at this time will follow-up with PCP and pulmonary in outpatient setting.  He will need to continue palliative care discussion outpatient setting given his poor prognosis.  Assessment and Plan:  Acute on Chronic Hypoxemic Respiratory Failure secondary to pulmonary fibrosis exacerbation and ILD flar: -Extensively investigated and followed by [facility] and pulmonology at [facility].   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  Also has some evidence of cirrhosis. -Continue bronchodilator therapy, high-dose IV steroids sith Solumedrol 60 mg q12g, inhalational steroids with Pulmicort and continue with Arformoterol 15 mcg Neb BID -C/w Duoneb 3 mL Neb q6h -Do breathing exercises and incentive spirometry.  Mucolytic's. -Antibiotics due to severity of symptoms, will continue azithromycin for 5 days and Add Ceftriaxone and changed to cefdinir for discharge SpO2: 97 % O2 Flow Rate (L/min): 8 L/min FiO2 (%): 40 % -Supplemental oxygen to keep saturations more than 88%. -Due to advanced fibrosis and high short-term mortality, will need goal of care discussion.  Will consult palliative care team to initiate discussion.  Family agrees. -Will start mobilizing with PT OT and they are  recommending Home Health PT/OT -PCT was 0.52 -WBC Trend: Recent Labs  Lab 02/02/23 1430 02/11/23 1835 02/12/23 0030 02/14/23 1039 02/15/23 0400  WBC 5.6 9.7 6.3 10.1 8.5  -From outpatient patient is on rituximab and pirfenidone. Will continue pirfenidone -RSV panel negative for COVID, influenza and RSV. -Repeat CXR done and showed "No significant change.Marland Kitchen RIGHT upper lobe airspace pneumonia. Very low lung volumes" -CXR prior to D/C dhowed "Markedly low volume film with diffuse bilateral interstitial and airspace disease suggesting edema or asymmetric infection. No substantial change." -Will need to ensure that his home oxygen 100 goes up to 10 L and have TOC to help with this. -At discharge he was transitioned to 40 mg of p.o. prednisone and taper over 2 weeks down to baseline dose of 10 mg.   -Patient is to follow-up with Dr. Marchelle Gearing in outpatient setting   Hypophosphatemia -Phos Level Trend: Recent Labs  Lab 02/14/23 1039 02/15/23 0400  PHOS 2.3* 2.4*  -Replete with po K Phos Neutral 500 mg x1 again prior to discharge -Repeat Phos within 1 week  Hepatic Cirrhosis with mild ascites Abnormal LFTs -Stable.  On Lasix 20 mg po Daily and Spironlactone 50 mg po daily which are being continued -Give a dose of IV Lasix prior to discharge -AST/ALT and Bilirubin Trend: Recent Labs  Lab 02/11/23 1835 02/12/23 0030 02/14/23 1039 02/15/23 0400  AST 67* 74* 69* 97*  ALT 37 40 45* 56*  BILITOT 1.4* 1.3* 0.7 0.7  -Continue to Monitor and Trend and repeat CMP within 1 week  Macrocytic Anemia/Anemia of Chronic Disease -Hemoglobin is stable. -Hgb/Hct Trend: Recent Labs  Lab 02/02/23 1430 02/11/23 1835 02/12/23 0030 02/14/23 1039  02/15/23 0400  HGB 9.2* 9.3* 8.5* 8.4* 8.2*  HCT 29.4* 28.7* 27.2* 25.9* 25.4*  MCV 118.1* 114.3* 115.7* 111.6* 111.4*  -Check Anemia Panel in the outpatient setting -Continue to Monitor for S/Sx of Bleeding; No overt bleeding noted -Repeat CBC  within 1 week  Hypergammaglobulinemia -They were unable to send an immunodeficiency panel in the hospital and will now send him from the clinic -Follow-up with Dr. 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 -CBG Trend: Recent Labs  Lab 02/13/23 2107 02/14/23 0809 02/14/23 1215 02/14/23 1608 02/14/23 2102 02/15/23 0808 02/15/23 1207  GLUCAP 170* 159* 154* 242* 136* 173* 173*   Hypoalbuminemia -Patient's Albumin Trend: Recent Labs  Lab 02/11/23 1835 02/12/23 0030 02/14/23 1039 02/15/23 0400  ALBUMIN <1.5* <1.5* <1.5* <1.5*  -Continue to Monitor and Trend and repeat CMP within 1 week  GERD/GI Prophylaxis -C/w Pantoprazole 40 mg po Daily   Goal of care: As above.  -Patient will need goal of care discussions.  Palliative care consulted. He was explained the severe nature of his lung disease.  Progressively worsening condition and advised to consider DO NOT INTUBATE his status because if he has to go on a ventilator, more than likely he will not be able to be liberated from the ventilator. Also discussed that in this stage of his lung disease, he may benefit with palliation or hospice at home.  They have agreed to meet with palliative care team and will need continued palliative care discussion in the outpatient setting

## 2023-02-12 NOTE — ED Notes (Signed)
RN took pt off bipap and placed pt on his regular 5L of O2. Pt in no apparent distress at this time

## 2023-02-12 NOTE — Progress Notes (Signed)
   02/12/23 0050  Therapy Vitals  Patient Position (if appropriate) Lying  Respiratory Assessment  Assessment Type Assess only  Respiratory Pattern Tachypnea;Unlabored  Chest Assessment Chest expansion symmetrical  Cough Non-productive;Dry  Bilateral Breath Sounds Fine crackles  Oxygen Therapy/Pulse Ox  O2 Device Nasal Cannula  O2 Flow Rate (L/min) 5 L/min   Pt states his breathing is fine and he does not feel SOB at the moment. Pt is agreeable to wearing a BiPAP if needed, but he doesn't feel like he needs it right now. Pt tolerating on 5LNC. RT will assist as needed.

## 2023-02-12 NOTE — Progress Notes (Signed)
PROGRESS NOTE    Richard Davenport  ZOX:[id number] DOB: 09-11-1960 DOA: 02/11/2023 PCP: Kalman Shan, MD    Brief Narrative:  63 year old gentleman with history of interstitial lung disease/pulmonary fibrosis, chronic hypoxemic respiratory failure on 5 L oxygen at home, history of subarachnoid hemorrhage status post aneurysm coiling, hepatic cirrhosis, hyperlipidemia, anemia of chronic disease and chronic thrombocytopenia presented to the ER with worsening shortness of breath for about 1 week.  Last few days it has been worse.  He does have chronic cough at baseline but now with increased frequency.  Patient on rescue albuterol at home. In the emergency room, initially with respiratory distress and treated with BiPAP but currently on nasal cannula oxygen.  Subjective: Patient seen and examined.  2 of his daughters at the bedside.  Patient is currently on nasal cannula oxygen and feels fairly better at rest.  Denies any discomfort.  Afebrile overnight. Called and discussed case with pulmonary for follow-up and seen in consultation. Waiting for inpatient bed. Patient's daughter at the bedside.  They are very well versed about his medical condition.   Assessment & Plan:   Acute on chronic hypoxemic respiratory failure secondary to pulmonary fibrosis exacerbation: Extensively investigated and followed by [facility] and pulmonology at [facility].  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  Also has some evidence of cirrhosis. Continue bronchodilator therapy, high-dose IV steroids, inhalational steroids with Pulmicort. Do breathing exercises and incentive spirometry.  Mucolytic's. Antibiotics due to severity of symptoms, will continue azithromycin for 5 days. Supplemental oxygen to keep saturations more than 90%. Due to advanced fibrosis and high short-term mortality, will need goal of care discussion.  Will consult palliative care team to initiate discussion. Will start mobilizing with PT  OT. From outpatient patient is on rituximab and pirfenidone.  Will continue for prednisone. RSV panel negative for COVID, influenza and RSV.  Chronic medical issues including Hepatic cirrhosis with mild ascites, stable.  On Lasix and aspirin lactone. Anemia of chronic disease, hemoglobin is stable. 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  Goal of care: As above.  Patient will need goal of care discussions.  Palliative care consulted.   DVT prophylaxis: SCDs Start: 02/12/23 0006   Code Status: Full code Family Communication: Daughter at the bedside. Disposition Plan: Status is: Inpatient Remains inpatient appropriate because: Significant shortness of breath, inpatient treatment needed     Consultants:  Pulmonary Palliative care  Procedures:  None  Antimicrobials:  Azithromycin 1/21---     Objective: Vitals:   02/12/23 0830 02/12/23 0930 02/12/23 1030 02/12/23 1225  BP: 109/74 115/77 108/73   Pulse: (!) 103 (!) 104 (!) 102   Resp: 18 (!) 25 (!) 31   Temp:    98.1 F (36.7 C)  TempSrc:    Axillary  SpO2: 96% 96% 100%   Weight:      Height:       No intake or output data in the 24 hours ending 02/12/23 1324 Filed Weights   02/11/23 1823  Weight: 69.4 kg    Examination:  General exam: Chronically sick looking.  In mild distress.  Able to talk in complete sentences. Respiratory system: Mostly clear.  Poor inspiratory force. Cardiovascular system: S1 & S2 heard, RRR. No pedal edema. Gastrointestinal system: Soft.  Nontender.  Bowel sound present. Central nervous system: Alert and oriented. No focal neurological deficits. Extremities: Symmetric 5 x 5 power.    Data Reviewed: I have  personally reviewed following labs and imaging studies  CBC: Recent Labs  Lab 02/11/23 1835 02/12/23 0030  WBC 9.7 6.3  HGB 9.3* 8.5*  HCT 28.7* 27.2*  MCV 114.3* 115.7*  PLT 128* 109*   Basic  Metabolic Panel: Recent Labs  Lab 02/11/23 1835 02/12/23 0030  NA 140 139  K 3.5 4.1  CL 108 108  CO2 22 17*  GLUCOSE 129* 244*  BUN 28* 32*  CREATININE 0.93 1.18  CALCIUM 8.3* 8.7*   GFR: Estimated Creatinine Clearance: 64.4 mL/min (by C-G formula based on SCr of 1.18 mg/dL). Liver Function Tests: Recent Labs  Lab 02/11/23 1835 02/12/23 0030  AST 67* 74*  ALT 37 40  ALKPHOS 245* 242*  BILITOT 1.4* 1.3*  PROT 7.8 7.8  ALBUMIN <1.5* <1.5*   No results for input(s): "LIPASE", "AMYLASE" in the last 168 hours. No results for input(s): "AMMONIA" in the last 168 hours. Coagulation Profile: No results for input(s): "INR", "PROTIME" in the last 168 hours. Cardiac Enzymes: No results for input(s): "CKTOTAL", "CKMB", "CKMBINDEX", "TROPONINI" in the last 168 hours. BNP (last 3 results) Recent Labs    11/05/22 1434  PROBNP 111.0*   HbA1C: No results for input(s): "HGBA1C" in the last 72 hours. CBG: Recent Labs  Lab 02/12/23 0747 02/12/23 1240  GLUCAP 206* 202*   Lipid Profile: No results for input(s): "CHOL", "HDL", "LDLCALC", "TRIG", "CHOLHDL", "LDLDIRECT" in the last 72 hours. Thyroid Function Tests: No results for input(s): "TSH", "T4TOTAL", "FREET4", "T3FREE", "THYROIDAB" in the last 72 hours. Anemia Panel: No results for input(s): "VITAMINB12", "FOLATE", "FERRITIN", "TIBC", "IRON", "RETICCTPCT" in the last 72 hours. Sepsis Labs: No results for input(s): "PROCALCITON", "LATICACIDVEN" in the last 168 hours.  Recent Results (from the past 240 hours)  Resp panel by RT-PCR (RSV, Flu A&B, Covid) Anterior Nasal Swab     Status: None   Collection Time: 02/02/23  2:31 PM   Specimen: Anterior Nasal Swab  Result Value Ref Range Status   SARS Coronavirus 2 by RT PCR NEGATIVE NEGATIVE Final   Influenza A by PCR NEGATIVE NEGATIVE Final   Influenza B by PCR NEGATIVE NEGATIVE Final    Comment: (NOTE) The Xpert Xpress SARS-CoV-2/FLU/RSV plus assay is intended as an aid in  the diagnosis of influenza from Nasopharyngeal swab specimens and should not be used as a sole basis for treatment. Nasal washings and aspirates are unacceptable for Xpert Xpress SARS-CoV-2/FLU/RSV testing.  Fact Sheet for Patients: BloggerCourse.com  Fact Sheet for Healthcare Providers: SeriousBroker.it  This test is not yet approved or cleared by the [place] FDA and has been authorized for detection and/or diagnosis of SARS-CoV-2 by FDA under an Emergency Use Authorization (EUA). This EUA will remain in effect (meaning this test can be used) for the duration of the COVID-19 declaration under Section 564(b)(1) of the Act, 21 U.S.C. section 360bbb-3(b)(1), unless the authorization is terminated or revoked.     Resp Syncytial Virus by PCR NEGATIVE NEGATIVE Final    Comment: (NOTE) Fact Sheet for Patients: BloggerCourse.com  Fact Sheet for Healthcare Providers: SeriousBroker.it  This test is not yet approved or cleared by the [place] FDA and has been authorized for detection and/or diagnosis of SARS-CoV-2 by FDA under an Emergency Use Authorization (EUA). This EUA will remain in effect (meaning this test can be used) for the duration of the COVID-19 declaration under Section 564(b)(1) of the Act, 21 U.S.C. section 360bbb-3(b)(1), unless the authorization is terminated or revoked.  Performed at [facility],  [address] [place], [place] [address]   Resp panel by RT-PCR (RSV, Flu A&B, Covid) Anterior Nasal Swab     Status: None   Collection Time: 02/11/23  6:29 PM   Specimen: Anterior Nasal Swab  Result Value Ref Range Status   SARS Coronavirus 2 by RT PCR NEGATIVE NEGATIVE Final   Influenza A by PCR NEGATIVE NEGATIVE Final   Influenza B by PCR NEGATIVE NEGATIVE Final    Comment: (NOTE) The Xpert Xpress SARS-CoV-2/FLU/RSV plus assay is intended as an  aid in the diagnosis of influenza from Nasopharyngeal swab specimens and should not be used as a sole basis for treatment. Nasal washings and aspirates are unacceptable for Xpert Xpress SARS-CoV-2/FLU/RSV testing.  Fact Sheet for Patients: BloggerCourse.com  Fact Sheet for Healthcare Providers: SeriousBroker.it  This test is not yet approved or cleared by the [place] FDA and has been authorized for detection and/or diagnosis of SARS-CoV-2 by FDA under an Emergency Use Authorization (EUA). This EUA will remain in effect (meaning this test can be used) for the duration of the COVID-19 declaration under Section 564(b)(1) of the Act, 21 U.S.C. section 360bbb-3(b)(1), unless the authorization is terminated or revoked.     Resp Syncytial Virus by PCR NEGATIVE NEGATIVE Final    Comment: (NOTE) Fact Sheet for Patients: BloggerCourse.com  Fact Sheet for Healthcare Providers: SeriousBroker.it  This test is not yet approved or cleared by the [place] FDA and has been authorized for detection and/or diagnosis of SARS-CoV-2 by FDA under an Emergency Use Authorization (EUA). This EUA will remain in effect (meaning this test can be used) for the duration of the COVID-19 declaration under Section 564(b)(1) of the Act, 21 U.S.C. section 360bbb-3(b)(1), unless the authorization is terminated or revoked.  Performed at [facility], [address] [place], [place] [address]          Radiology Studies: CT ANGIO CHEST PE W OR WO CONTRAST Result Date: 02/11/2023 CLINICAL DATA:  Pulmonary embolism (PE) suspected, high prob. Shortness of breath. EXAM: CT ANGIOGRAPHY CHEST WITH CONTRAST TECHNIQUE: Multidetector CT imaging of the chest was performed using the standard protocol during bolus administration of intravenous contrast. Multiplanar CT image reconstructions and MIPs were  obtained to evaluate the vascular anatomy. RADIATION DOSE REDUCTION: This exam was performed according to the departmental dose-optimization program which includes automated exposure control, adjustment of the mA and/or kV according to patient size and/or use of iterative reconstruction technique. CONTRAST:  75mL OMNIPAQUE IOHEXOL 350 MG/ML SOLN COMPARISON:  05/19/2022 FINDINGS: Cardiovascular: No filling defects in the pulmonary arteries to suggest pulmonary emboli. Heart borderline in size. Aorta normal caliber. Mediastinum/Nodes: No mediastinal, hilar, or axillary adenopathy. Trachea and esophagus are unremarkable. Thyroid unremarkable. Lungs/Pleura: Changes of fibrosis throughout the lungs, similar to prior study. No definite acute process. No effusions. Upper Abdomen: Cirrhosis.  Large volume upper abdominal ascites. Musculoskeletal: Bilateral gynecomastia.  No acute bony abnormality. Review of the MIP images confirms the above findings. IMPRESSION: No evidence of pulmonary embolus. Very low lung volumes with changes of severe fibrosis. No definite acute process. Borderline cardiomegaly. Cirrhosis, large volume upper abdominal ascites. Electronically Signed   By: Charlett Nose M.D.   On: 02/11/2023 22:06   DG Chest Port 1 View Result Date: 02/11/2023 CLINICAL DATA:  Shortness of breath EXAM: PORTABLE CHEST 1 VIEW COMPARISON:  02/02/2023 FINDINGS: Heart and mediastinal contours are stable. Very low lung volumes with chronic fibrotic changes throughout the lungs. No definite acute process or effusions. No  acute bony abnormality. IMPRESSION: Very low lung volumes with stable chronic fibrosis. No definite acute process. Electronically Signed   By: Charlett Nose M.D.   On: 02/11/2023 18:50        Scheduled Meds:  arformoterol  15 mcg Nebulization BID   [START ON 02/13/2023] azithromycin  500 mg Oral Daily   budesonide (PULMICORT) nebulizer solution  0.25 mg Nebulization BID   furosemide  20 mg Oral Daily    guaiFENesin  600 mg Oral BID   insulin aspart  0-9 Units Subcutaneous TID WC   methylPREDNISolone (SOLU-MEDROL) injection  60 mg Intravenous Q12H   sodium chloride flush  3 mL Intravenous Q12H   spironolactone  50 mg Oral Daily   Continuous Infusions:   LOS: 0 days    Time spent: 52 minutes     Dorcas Carrow, MD [facility]

## 2023-02-12 NOTE — H&P (Incomplete)
History and Physical    Richard Davenport UXL:[id number] DOB: [birth year] DOA: 02/11/2023  PCP: Kalman Shan, MD  Patient coming from: Home  I have personally briefly reviewed patient's old medical records in [facility]  Chief Complaint: Shortness of breath  HPI: Richard Davenport is a 63 y.o. male with medical history significant for ILD/pulmonary fibrosis, chronic hypoxic respiratory failure on 5 L O2 [place], Hx of SAH s/p aneurysm coiling, hepatic cirrhosis, HLD, anemia of chronic disease, chronic thrombocytopenia who presents to the ED for evaluation of shortness of breath.  Patient has known ILD/pulmonary fibrosis follows with pulmonology Dr. Marchelle Gearing locally and Dr. Josph Macho with [facility].  He is on 5 L of home O2 via [place] at baseline.  Patient and family states that he was doing well 1 week ago, going to the grocery store and ambulating fine on his home O2.  Over the last few days he has had progressively increasing shortness of breath and tachypnea.  He has a chronic cough at baseline but has had increased frequency his last few days.  He states that he has a lot of chest congestion but no sputum production with cough.  He reports coughing after he eats but denies any choking sensation or feeling like he has aspirated.  He reported subjective fevers at home day prior to admission.  Patient states that he is only using albuterol rescue inhaler infrequently when needed.  He says he is not using any maintenance nebulizers.  EMS were called and per ED triage documentation he was given Solu-Medrol 125 mg, albuterol, DuoNeb, and Atrovent treatments prior to arrival.  ED Course  Labs/Imaging on admission: I have personally reviewed following labs and imaging studies.  Initial vitals showed BP 104/65, pulse 131, RR 40, temp 98.7 F, SpO2 96% on 5 L O2 via [place].  Labs showed sodium 140, potassium 3.5, bicarb 22, BUN 28, creatinine 0.93, serum glucose 129, albumin <1.5, AST 67, ALT 37,  alk phos 245, total bilirubin 1.4, WBC 9.7, hemoglobin 9.3, platelets 128,000, BNP 101.4.  Troponin 11.  SARS-CoV-2, influenza, RSV PCR negative.  CTA chest negative for evidence of PE.  Low lung volumes with changes of severe fibrosis throughout.  No definite acute process.  Borderline cardiomegaly.  Cirrhosis with upper abdominal ascites.  Patient was given DuoNebs, IV magnesium 2 g.  EDP spoke with PCCM who recommended medical admission.  The hospitalist service was consulted to admit for further evaluation management.  Review of Systems: All systems reviewed and are negative except as documented in history of present illness above.   Past Medical History:  Diagnosis Date   Asthma    Diabetes mellitus without complication (HCC)    Headache    Idiopathic pulmonary fibrosis (HCC)    Stroke (HCC)    Subarachnoid hemorrhage (HCC)     Past Surgical History:  Procedure Laterality Date   ANEURYSM COILING     for bleed   LOOP RECORDER INSERTION N/A 04/10/2018   Procedure: LOOP RECORDER INSERTION;  Surgeon: [facility] Salvia, MD;  Location: [facility] INVASIVE CV LAB;  Service: Cardiovascular;  Laterality: N/A;   RADIOLOGY WITH ANESTHESIA N/A 10/09/2012   Procedure: RADIOLOGY WITH ANESTHESIA;  Surgeon: Lisbeth Renshaw, MD;  Location: [facility] OR;  Service: Radiology;  Laterality: N/A;   RADIOLOGY WITH ANESTHESIA N/A 05/27/2013   Procedure: RADIOLOGY WITH ANESTHESIA;  Surgeon: Lisbeth Renshaw, MD;  Location: [facility] OR;  Service: Radiology;  Laterality: N/A;    Social History:  reports that he  has never smoked. He has been exposed to tobacco smoke. He has never used smokeless tobacco. He reports that he does not drink alcohol and does not use drugs.  Allergies  Allergen Reactions   Pork-Derived Products Other (See Comments)    Patient is Muslim and PREFERS TO NOT TAKE ANY PORK OR MEAT PRODUCTS (only fish)    Family History  Problem Relation Age of Onset   Hypertension Mother    Hypertension Father       Prior to Admission medications   Medication Sig Start Date End Date Taking? Authorizing Provider  albuterol (PROVENTIL) (2.5 MG/3ML) 0.083% nebulizer solution Take 3 mLs (2.5 mg total) by nebulization every 4 (four) hours as needed for wheezing or shortness of breath. 01/07/23   Kozlow, Alvira Philips, MD  albuterol (VENTOLIN HFA) 108 (90 Base) MCG/ACT inhaler Inhale 2 puffs into the lungs every 6 (six) hours as needed for wheezing or shortness of breath. 01/07/23   Kozlow, Alvira Philips, MD  budesonide (PULMICORT) 0.5 MG/2ML nebulizer solution Take 2 mLs (0.5 mg total) by nebulization in the morning, at noon, in the evening, and at bedtime. 01/07/23   Kozlow, Alvira Philips, MD  dextromethorphan (DELSYM) 30 MG/5ML liquid Take 30 mg by mouth 2 (two) times daily as needed for cough.    [provider]  furosemide (LASIX) 40 MG tablet Take 0.5 tablets (20 mg total) by mouth daily. 05/20/22 05/20/23  Arnetha Courser, MD  gabapentin (NEURONTIN) 300 MG capsule Take 300 mg by mouth 3 (three) times daily. 08/28/22   [provider]  metFORMIN (GLUCOPHAGE) 500 MG tablet Take 1 tablet (500 mg total) by mouth 2 (two) times daily. Patient taking differently: Take 500 mg by mouth daily. 12/14/17   Linwood Dibbles, MD  mometasone (ELOCON) 0.1 % ointment Apply topically daily. 1 application to left ear once daily. 01/07/23   Kozlow, Alvira Philips, MD  omeprazole (PRILOSEC) 20 MG capsule Take 20 mg by mouth daily.    [provider]  Pirfenidone 267 MG TABS Take 3 tablets (801 mg total) by mouth with breakfast, with lunch, and with evening meal. Month 2 and onwards Patient taking differently: Take 534 mg by mouth with breakfast, with lunch, and with evening meal. 03/13/22   Olalere, Adewale A, MD  riTUXimab (RITUXAN IV) Inject 1 Dose into the vein every 6 (six) months. Unknown dosage    [provider]  spironolactone (ALDACTONE) 50 MG tablet Take 50 mg by mouth daily. 05/06/22 05/06/23  [provider]     Physical Exam: Vitals:   02/11/23 2130 02/11/23 2230 02/11/23 2300 02/12/23 0000  BP: (!) 102/59 107/73 105/80 118/76  Pulse: (!) 128 (!) 119 (!) 122 (!) 119  Resp: (!) 43 (!) 21 (!) 42 (!) 38  Temp:      TempSrc:      SpO2: 100% 97% (!) 88% 96%  Weight:      Height:       Constitutional: Chronically ill-appearing thin man resting in bed with head elevated Eyes: EOMI, lids and conjunctivae normal ENMT: Mucous membranes are moist. Posterior pharynx clear of any exudate or lesions.Normal dentition.  Neck: normal, supple, no masses. Respiratory: Fine inspiratory crackles throughout with faint end expiratory wheezing.  Increased respiratory effort while on 5 L O2 via [place].  Cardiovascular: Tachycardic, no murmurs / rubs / gallops. No extremity edema. 2+ pedal pulses. Abdomen: no tenderness, no masses palpated.  Musculoskeletal: no clubbing / cyanosis. No joint deformity upper and lower extremities.  Good ROM, no contractures. Normal muscle tone.  Thin extremities. Skin: no rashes, lesions, ulcers. No induration Neurologic: Sensation intact. Strength 5/5 in all 4.  Psychiatric:  Alert and oriented x 3. Normal mood.   EKG: Personally reviewed. Sinus tachycardia, rate 124, late R wave transition.  LVH.  Rate is slower when compared to prior.  Assessment/Plan Principal Problem:   Acute on chronic respiratory failure with hypoxia (HCC) Active Problems:   Thrombocytopenia (HCC)   Cirrhosis (HCC)   Lung disease, interstitial (HCC)   Type 2 diabetes mellitus (HCC)   Richard Davenport is a 63 y.o. male with medical history significant for ILD/pulmonary fibrosis, chronic hypoxic respiratory failure on 5 L O2 [place], Hx of SAH s/p aneurysm coiling, hepatic cirrhosis, HLD, anemia of chronic disease, chronic thrombocytopenia who is admitted for acute on chronic hypoxic respiratory failure in setting of ILD/pulmonary fibrosis.  Assessment and Plan: Acute on chronic hypoxic respiratory failure due  to ILD/pulmonary fibrosis: On 5 L O2 via [place] at baseline, SpO2 was 83% at home with EMS while on his home O2.  Remains tachypneic but saturating better now on 5 L [place].  CTA negative for PE, severe fibrosis changes noted.  Patient states he is not using maintenance inhalers at home.  On pirfenidone and rituximab through [facility]. -IV Solu-Medrol 40 mg twice daily -[place]/Pulmicort BID, DuoNebs as needed -Start azithromycin -Continue supplemental oxygen at baseline 5 L [place] -BiPAP if needed -Check respiratory panel  Hepatic cirrhosis: Stable, mentating well on admission.  Continue home Lasix/spironolactone.  Anemia of chronic disease: Hemoglobin stable at 9.3.  Chronic thrombocytopenia: Mild without obvious bleeding in setting of cirrhosis.  Continue to monitor.  Type 2 diabetes: Well-controlled with last hemoglobin A1c 4.9% 12/20/2022.  Holding metformin.  Placed on SSI while on steroids.   DVT prophylaxis: SCDs Start: 02/12/23 0006 Code Status: Full code, discussed with patient on admission Family Communication: 2 daughters at bedside Disposition Plan: From home, dispo pending clinical progress Consults called: None Severity of Illness: The appropriate patient status for this patient is INPATIENT. Inpatient status is judged to be reasonable and necessary in order to provide the required intensity of service to ensure the patient's safety. The patient's presenting symptoms, physical exam findings, and initial radiographic and laboratory data in the context of their chronic comorbidities is felt to place them at high risk for further clinical deterioration. Furthermore, it is not anticipated that the patient will be medically stable for discharge from the hospital within 2 midnights of admission.   * I certify that at the point of admission it is my clinical judgment that the patient will require inpatient hospital care spanning beyond 2 midnights from the point of admission due to high intensity  of service, high risk for further deterioration and high frequency of surveillance required.Darreld Mclean MD [facility]  If 7PM-7AM, please contact night-coverage [email or web address]  02/12/2023, 12:36 AM

## 2023-02-12 NOTE — Consult Note (Signed)
NAME:  Richard Davenport, MRN:  [id number], DOB:  Oct 14, 1960, LOS: 0 ADMISSION DATE:  02/11/2023, CONSULTATION DATE: 02/12/2023 REFERRING MD: [facility], CHIEF COMPLAINT: Shortness of breath in the setting of ILD  History of Present Illness:  63 year old male with a significant lung disease with ILD and O2 dependent at 5 L nasal cannula daily.  He did work in a Biomedical scientist for short-term.  He is followed for ILD at [facility] and by Dr. Marchelle Gearing.  He presents with increasing shortness of breath without fever chills sweats but does note increased coughing.  His past medical history is well-documented below.  Pulmonary critical care will continue to follow  Pertinent  Medical History   Past Medical History:  Diagnosis Date   Asthma    Diabetes mellitus without complication (HCC)    Headache    Idiopathic pulmonary fibrosis (HCC)    Stroke (HCC)    Subarachnoid hemorrhage (HCC)      Significant Hospital Events: Including procedures, antibiotic start and stop dates in addition to other pertinent events     Interim History / Subjective:  More short of breath than usual  Objective   Blood pressure 108/73, pulse (!) 102, temperature 97.9 F (36.6 C), temperature source Axillary, resp. rate (!) 31, height 5\' 8"  (1.727 m), weight 69.4 kg, SpO2 100%.    FiO2 (%):  [40 %] 40 %  No intake or output data in the 24 hours ending 02/12/23 1119 Filed Weights   02/11/23 1823  Weight: 69.4 kg    Examination: General: Frail 63 year old who looks older than stated age HENT: No JVD or lymphadenopathy is appreciated Lungs: Decreased air movement throughout Cardiovascular: Heart sounds are regular Abdomen: Soft nontender positive bowel sounds Extremities: Remedies with mild edema Neuro: Mild tremor for which she has had for approximately 6 months GU: Voids  Resolved Hospital Problem list     Assessment & Plan:  63 year old male with a history of IPF ILD he is followed by Dr. Marchelle Gearing and by  [facility].  His normal is 5 L nasal cannula which he can ambulate slowly without difficulty.  He notes he has had a cough for the last several weeks that worsens when he eats.  He denies hemoptysis or other purulent sputum.  He was more short of breath today 1 20-25 and was admitted to the emergency department to the [facility].  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 Diabetes mellitus Cirrhosis  Best Practice (right click and "Reselect all SmartList Selections" daily)   Diet/type: Regular consistency (see orders) DVT prophylaxis not indicated Pressure ulcer(s): N/A GI prophylaxis: PPI Lines: N/A Foley:  N/A Code Status:  full code Last date of multidisciplinary goals of care discussion [tbd]  Labs   CBC: Recent Labs  Lab 02/11/23 1835 02/12/23 0030  WBC 9.7 6.3  HGB 9.3* 8.5*  HCT 28.7* 27.2*  MCV 114.3* 115.7*  PLT 128* 109*    Basic Metabolic Panel: Recent Labs  Lab 02/11/23 1835 02/12/23 0030  NA 140 139  K 3.5 4.1  CL 108 108  CO2 22 17*  GLUCOSE 129* 244*  BUN 28* 32*  CREATININE 0.93 1.18  CALCIUM 8.3* 8.7*   GFR: Estimated Creatinine Clearance: 64.4 mL/min (by C-G formula based on SCr of 1.18 mg/dL). Recent Labs  Lab 02/11/23 1835 02/12/23 0030  WBC 9.7 6.3    Liver Function Tests: Recent Labs   Lab 02/11/23 1835 02/12/23 0030  AST 67* 74*  ALT 37 40  ALKPHOS 245* 242*  BILITOT 1.4* 1.3*  PROT 7.8 7.8  ALBUMIN <1.5* <1.5*   No results for input(s): "LIPASE", "AMYLASE" in the last 168 hours. No results for input(s): "AMMONIA" in the last 168 hours.  ABG    Component Value Date/Time   PHART 7.386 10/09/2012 2256   PCO2ART 37.6 10/09/2012 2256   PO2ART 98.0 10/09/2012 2256   HCO3 26.5 12/19/2022 1609   TCO2 28 12/19/2022 1609   ACIDBASEDEF 2.0 10/09/2012 2256   O2SAT 95 12/19/2022 1609     Coagulation Profile: No results for input(s): "INR", "PROTIME" in the last 168 hours.  Cardiac Enzymes: No results for input(s): "CKTOTAL", "CKMB", "CKMBINDEX", "TROPONINI" in the last 168 hours.  HbA1C: Hgb A1c MFr Bld  Date/Time Value Ref Range Status  12/20/2022 05:43 AM 4.9 4.8 - 5.6 % Final    Comment:    (NOTE) Pre diabetes:          5.7%-6.4%  Diabetes:              >6.4%  Glycemic control for   <7.0% adults with diabetes   05/20/2022 04:15 AM 5.6 4.8 - 5.6 % Final    Comment:    (NOTE) Pre diabetes:          5.7%-6.4%  Diabetes:              >6.4%  Glycemic control for   <7.0% adults with diabetes     CBG: Recent Labs  Lab 02/12/23 0747  GLUCAP 206*    Review of Systems:   10 point review of system taken, please see HPI for positives and negatives.   Past Medical History:  He,  has a past medical history of Asthma, Diabetes mellitus without complication (HCC), Headache, Idiopathic pulmonary fibrosis (HCC), Stroke (HCC), and Subarachnoid hemorrhage (HCC).   Surgical History:   Past Surgical History:  Procedure Laterality Date   ANEURYSM COILING     for bleed   LOOP RECORDER INSERTION N/A 04/10/2018   Procedure: LOOP RECORDER INSERTION;  Surgeon: Duke Salvia, MD;  Location: [facility] INVASIVE CV LAB;  Service: Cardiovascular;  Laterality: N/A;   RADIOLOGY WITH ANESTHESIA N/A 10/09/2012   Procedure: RADIOLOGY WITH ANESTHESIA;  Surgeon: Lisbeth Renshaw, MD;  Location: [facility] OR;  Service: Radiology;  Laterality: N/A;   RADIOLOGY WITH ANESTHESIA N/A 05/27/2013   Procedure: RADIOLOGY WITH ANESTHESIA;  Surgeon: Lisbeth Renshaw, MD;  Location: [facility] OR;  Service: Radiology;  Laterality: N/A;     Social History:   reports that he has never smoked. He has been exposed to tobacco smoke. He has never used smokeless tobacco. He reports that he does not drink alcohol and does not use drugs.   Family History:  His family history includes Hypertension in his father and mother.   Allergies Allergies  Allergen Reactions   Pork-Derived Products Other (See Comments)  Patient is Muslim and PREFERS TO NOT TAKE ANY PORK OR MEAT PRODUCTS (only fish)     Home Medications  Prior to Admission medications   Medication Sig Start Date End Date Taking? Authorizing Provider  albuterol (PROVENTIL) (2.5 MG/3ML) 0.083% nebulizer solution Take 3 mLs (2.5 mg total) by nebulization every 4 (four) hours as needed for wheezing or shortness of breath. 01/07/23  Yes Kozlow, Alvira Philips, MD  albuterol (VENTOLIN HFA) 108 (90 Base) MCG/ACT inhaler Inhale 2 puffs into the lungs every 6 (six) hours as needed for wheezing or shortness of breath. 01/07/23  Yes Kozlow, Alvira Philips, MD  budesonide (PULMICORT) 0.5 MG/2ML nebulizer solution Take 2 mLs (0.5 mg total) by nebulization in the morning, at noon, in the evening, and at bedtime. 01/07/23  Yes Kozlow, Alvira Philips, MD  furosemide (LASIX) 40 MG tablet Take 0.5 tablets (20 mg total) by mouth daily. Patient taking differently: Take 20 mg by mouth daily as needed for fluid. 05/20/22 05/20/23 Yes Arnetha Courser, MD  gabapentin (NEURONTIN) 300 MG capsule Take 300 mg by mouth 3 (three) times daily. 08/28/22  Yes [provider]  guaiFENesin-dextromethorphan (ROBITUSSIN DM) 100-10 MG/5ML syrup Take 15 mLs by mouth every 8 (eight) hours as needed for cough.   Yes [provider]  metFORMIN (GLUCOPHAGE) 500 MG tablet Take 1 tablet  (500 mg total) by mouth 2 (two) times daily. Patient taking differently: Take 500 mg by mouth daily. 12/14/17  Yes Linwood Dibbles, MD  mometasone (ELOCON) 0.1 % ointment Apply topically daily. 1 application to left ear once daily. 01/07/23  Yes Kozlow, Alvira Philips, MD  Multiple Vitamin (MULTIVITAMIN WITH MINERALS) TABS tablet Take 1 tablet by mouth daily.   Yes [provider]  omeprazole (PRILOSEC) 20 MG capsule Take 20 mg by mouth daily as needed (for heartburn).   Yes [provider]  Pirfenidone 267 MG TABS Take 3 tablets (801 mg total) by mouth with breakfast, with lunch, and with evening meal. Month 2 and onwards Patient taking differently: Take 534 mg by mouth with breakfast, with lunch, and with evening meal. 03/13/22  Yes Olalere, Adewale A, MD  predniSONE (DELTASONE) 10 MG tablet Take 10 mg by mouth daily with breakfast.   Yes [provider]  riTUXimab (RITUXAN) 500 MG/50ML injection Inject 1,000 mg into the vein every 6 (six) months.   Yes [provider]  spironolactone (ALDACTONE) 50 MG tablet Take 50 mg by mouth daily. 05/06/22 05/06/23 Yes [provider]     Critical care time: Elizebeth Brooking Harshal Sirmon ACNP Acute Care Nurse Practitioner [facility] Pulmonary/Critical Care Please consult Amion 02/12/2023, 11:19 AM

## 2023-02-13 ENCOUNTER — Telehealth: Payer: Self-pay | Admitting: Pulmonary Disease

## 2023-02-13 DIAGNOSIS — Z7189 Other specified counseling: Secondary | ICD-10-CM | POA: Diagnosis not present

## 2023-02-13 DIAGNOSIS — J849 Interstitial pulmonary disease, unspecified: Secondary | ICD-10-CM

## 2023-02-13 DIAGNOSIS — J9611 Chronic respiratory failure with hypoxia: Secondary | ICD-10-CM

## 2023-02-13 DIAGNOSIS — Z515 Encounter for palliative care: Secondary | ICD-10-CM

## 2023-02-13 DIAGNOSIS — K7460 Unspecified cirrhosis of liver: Secondary | ICD-10-CM | POA: Diagnosis not present

## 2023-02-13 DIAGNOSIS — J9621 Acute and chronic respiratory failure with hypoxia: Secondary | ICD-10-CM | POA: Diagnosis not present

## 2023-02-13 LAB — GLUCOSE, CAPILLARY
Glucose-Capillary: 142 mg/dL — ABNORMAL HIGH (ref 70–99)
Glucose-Capillary: 205 mg/dL — ABNORMAL HIGH (ref 70–99)
Glucose-Capillary: 122 mg/dL — ABNORMAL HIGH (ref 70–99)
Glucose-Capillary: 170 mg/dL — ABNORMAL HIGH (ref 70–99)

## 2023-02-13 NOTE — Progress Notes (Signed)
Patient got up to the bsc, sats dropped to low 70s on 6 L. I increased the o2 to 7L with little changed, then increased to 10 L he came up to 88%, I then did another increase to 12L he came up to 92%. before I left the room I decreased O2 back to 10L.

## 2023-02-13 NOTE — Progress Notes (Signed)
PT Cancellation Note  Patient Details Name: Richard Davenport MRN: [id number] DOB: Mar 15, 1960   Cancelled Treatment:    Reason Eval/Treat Not Completed: Fatigue/lethargy limiting ability to participate. Pt desaturated during OT's session and is requesting a rest break. Will re-attempt as time allows.   Cheri Guppy, PT, DPT Acute Rehabilitation Services Office: [phone] Secure Chat Preferred   [name] 02/13/2023, 3:09 PM

## 2023-02-13 NOTE — Progress Notes (Signed)
NAME:  Richard Davenport, MRN:  [id number], DOB:  10-23-1960, LOS: 1 ADMISSION DATE:  02/11/2023, CONSULTATION DATE: 02/12/2023 REFERRING MD: [facility], CHIEF COMPLAINT: Shortness of breath in the setting of ILD  History of Present Illness:  63 year old male with a significant lung disease with ILD and O2 dependent at 5 L nasal cannula daily.  He did work in a Biomedical scientist for short-term.  He is followed for ILD at [facility] and by Dr. Marchelle Gearing.  He presents with increasing shortness of breath without fever chills sweats but does note increased coughing.  His past medical history is well-documented below.  Pulmonary critical care will continue to follow  Pertinent  Medical History   Past Medical History:  Diagnosis Date   Asthma    Diabetes mellitus without complication (HCC)    Headache    Idiopathic pulmonary fibrosis (HCC)    Stroke (HCC)    Subarachnoid hemorrhage (HCC)      Significant Hospital Events: Including procedures, antibiotic start and stop dates in addition to other pertinent events     Interim History / Subjective:  Reports breathing better  Objective   Blood pressure (!) 100/54, pulse 84, temperature 98.7 F (37.1 C), temperature source Oral, resp. rate 18, height 5\' 8"  (1.727 m), weight 65.9 kg, SpO2 93%.        Intake/Output Summary (Last 24 hours) at 02/13/2023 3295 Last data filed at 02/13/2023 1884 Gross per 24 hour  Intake 180 ml  Output 400 ml  Net -220 ml   Filed Weights   02/11/23 1823 02/13/23 0351  Weight: 69.4 kg 65.9 kg    Examination: 63 year old male in no acute distress at rest remains on 5 L nasal cannula No JVD lymphadenopathy is appreciated Diminished breath sounds throughout currently on 5 L nasal cannula sats are 95% Heart sounds are distant Abdomen soft nontender Voids amber urine 1+ lower extremity edema  Resolved Hospital Problem list     Assessment & Plan:  63 year old male with a history of IPF ILD he is followed by Dr.  Marchelle Gearing and by [facility].  His normal is 5 L nasal cannula which he can ambulate slowly without difficulty.  He notes he has had a cough for the last several weeks that worsens when he eats.  He denies hemoptysis or other purulent sputum.  He was more short of breath today 1 20-25 and was admitted to the emergency department to the [facility] hospitalist service.  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 Recent Labs  Lab 02/11/23 1835 02/12/23 0030  WBC 9.7 6.3    Liver Function Tests: Recent Labs  Lab 02/11/23 1835 02/12/23 0030  AST 67* 74*  ALT 37 40  ALKPHOS 245* 242*  BILITOT 1.4* 1.3*  PROT 7.8 7.8  ALBUMIN <1.5* <1.5*   No results for input(s): "LIPASE", "AMYLASE" in the last 168 hours. No results  for input(s): "AMMONIA" in the last 168 hours.  ABG    Component Value Date/Time   PHART 7.386 10/09/2012 2256   PCO2ART 37.6 10/09/2012 2256   PO2ART 98.0 10/09/2012 2256   HCO3 26.5 12/19/2022 1609   TCO2 28 12/19/2022 1609   ACIDBASEDEF 2.0 10/09/2012 2256   O2SAT 95 12/19/2022 1609     Coagulation Profile: No results for input(s): "INR", "PROTIME" in the last 168 hours.  Cardiac Enzymes: No results for input(s): "CKTOTAL", "CKMB", "CKMBINDEX", "TROPONINI" in the last 168 hours.  HbA1C: Hgb A1c MFr Bld  Date/Time Value Ref Range Status  12/20/2022 05:43 AM 4.9 4.8 - 5.6 % Final    Comment:    (NOTE) Pre diabetes:          5.7%-6.4%  Diabetes:              >6.4%  Glycemic control for   <7.0% adults with diabetes   05/20/2022 04:15 AM 5.6 4.8 - 5.6 % Final    Comment:    (NOTE) Pre diabetes:          5.7%-6.4%  Diabetes:              >6.4%  Glycemic control for   <7.0% adults with diabetes     CBG: Recent Labs  Lab 02/12/23 0747 02/12/23 1240 02/12/23 1730 02/12/23 2237 02/13/23 0815  GLUCAP 206* 202* 172* 185* 142*     Steve Irianna Gilday ACNP Acute Care Nurse Practitioner [facility] Pulmonary/Critical Care Please consult Amion 02/13/2023, 9:18 AM

## 2023-02-13 NOTE — Consult Note (Signed)
Palliative Care Consult Note                                  Date: 02/13/2023   Patient Name: Richard Davenport  DOB: 01/05/1961  MRN: [id number]  Age / Sex: 63 y.o., male  PCP: Kalman Shan, MD Referring Physician: Dorcas Carrow, MD  Reason for Consultation: Establishing goals of care  HPI/Patient Profile: 63 y.o. male  with past medical history of interstitial lung disease/pulmonary fibrosis, chronic hypoxemic respiratory failure on 5 L oxygen at home, history of subarachnoid hemorrhage status post aneurysm coiling, hepatic cirrhosis, hyperlipidemia, anemia of chronic disease and chronic thrombocytopenia who presented with progressive shortness of breath and coughing.  He was admitted on 02/11/2023 with acute on chronic hypoxic respiratory failure secondary to pulmonary fibrosis exacerbation, and others.   Palliative medicine was consulted for GOC conversations.  Past Medical History:  Diagnosis Date   Asthma    Diabetes mellitus without complication (HCC)    Headache    Idiopathic pulmonary fibrosis (HCC)    Stroke (HCC)    Subarachnoid hemorrhage (HCC)     Subjective:   This NP Wynne Dust reviewed medical records, received report from team, assessed the patient and then meet at the patient's bedside to discuss diagnosis, prognosis, GOC, EOL wishes disposition and options.  I met with the patient at the bedside.  His daughter Richard Davenport is also present.   We meet to discuss diagnosis prognosis, GOC, EOL wishes, disposition and options. Concept of Palliative Care was introduced as specialized medical care for people and their families living with serious illness.  If focuses on providing relief from the symptoms and stress of a serious illness.  The goal is to improve quality of life for both the patient and the family. Values and goals of care important to patient and family were attempted to be elicited.  Created space and  opportunity for patient  and family to explore thoughts and feelings regarding current medical situation   Natural trajectory and current clinical status were discussed. Questions and concerns addressed. Patient  encouraged to call with questions or concerns.    Patient/Family Understanding of Illness: He understands he has pulmonary fibrosis and a figure that out last March.  He does have progressive shortness of breath and sees [facility] as well as  pulmonary.  They state that they have not given him a timeframe but they told him that he is on medications to stop the progression.  He is under the impression that his disease is not advancing and he is trying to maintain stability.  We spent some time discussing clinical details related to his current health.  Life Review: He states that he has had a good life, worked up until he was referred to [employer].  He previously worked in [employer] work.  He has been married for 17 years and has 3 daughters, 2 daughters from a previous marriage and 1 daughter from his current wife.  He also has a son.  He enjoys traveling to [place] where most of his family lives.  He also enjoys watching TV and enjoys show good burger as well as the movie with robotics.  He is a religious man and practices and he is on tradition.  I offered chaplaincy support but he states that he has not an Imam who is supporting him.  Patient Values: Family  Goals: To get better, discharge from the  hospital, continue chronic care with [facility] and [place] [facility].  He wants to continue living his joyful life.  Today's Discussion: In addition to discussion described above we had extensive discussion of various topics.  He states that he does feel better than he did yesterday and much better from when he was admitted.  We talked about the possible future progression of his disease but he tells me clearly that he is on medications to prevent progression and he thinks that this is doing  well.  We discussed that there is possibility for future progression, although we will continue to hope that his disease remains stable.  I did share that there will be periods of likely exacerbation, such as this.  I encouraged him to think about the kind of care he would want if he gets into an exacerbation situation that cannot be reversed.  We discussed CODE STATUS and he is very clear that he wants to be full code and full scope.  He states if he gets to a point where they cannot fix him and his disease is progressive then he mentions something about lung transplant.  I shared that any such discussions would be at the purview of his providers at [facility].  At this time goals appear to be clear for full code and full scope of care.  I shared the palliative medicine would follow-up in a couple days to see how he is doing.  I shared my hope that he continues to feel better.  I provided emotional and general support through therapeutic listening, empathy, sharing of stories, and other techniques. I answered all questions and addressed all concerns to the best of my ability.  Review of Systems  Respiratory:  Positive for cough (Improved) and shortness of breath (Improved).   Cardiovascular:  Negative for chest pain.  Gastrointestinal:  Negative for abdominal pain, nausea and vomiting.    Objective:   Primary Diagnoses: Present on Admission:  Acute on chronic respiratory failure with hypoxia (HCC)  Thrombocytopenia (HCC)  Cirrhosis (HCC)  Lung disease, interstitial (HCC)   Physical Exam Vitals and nursing note reviewed.  Constitutional:      General: He is not in acute distress.    Appearance: He is ill-appearing.  HENT:     Head: Normocephalic and atraumatic.  Cardiovascular:     Rate and Rhythm: Normal rate.  Pulmonary:     Effort: Pulmonary effort is normal. No respiratory distress.     Comments: Noted cough during my visit Abdominal:     General: Abdomen is flat.  Skin:     General: Skin is warm and dry.  Neurological:     General: No focal deficit present.     Mental Status: He is alert.  Psychiatric:        Mood and Affect: Mood normal.        Behavior: Behavior normal.     Vital Signs:  BP 103/70 (BP Location: Left Arm)   Pulse 95   Temp 98.5 F (36.9 C) (Oral)   Resp 20   Ht 5\' 8"  (1.727 m)   Wt 65.9 kg   SpO2 99%   BMI 22.09 kg/m   Palliative Assessment/Data: 40-50% currently    Advanced Care Planning:   Existing Vynca/ACP Documentation: None  Primary Decision Maker: PATIENT  Code Status/Advance Care Planning: Full code  A discussion was had today regarding advanced directives. Concepts specific to code status, artifical feeding and hydration, continued IV antibiotics and rehospitalization was had.  The  difference between a aggressive medical intervention path and a palliative comfort care path for this patient at this time was had.   Decisions/Changes to ACP: None today  Assessment & Plan:   Impression: 63 year old male with acute presentation chronic comorbidities as described above.  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  He is currently followed by [place] [facility] and [facility].  He seems to have a little bit of an unrealistic understanding of his disease.  He seems quite confident that the medications will prevent any progression of his disease.  I shared that he could get to an exacerbation that is nonreversible at that point he seems to think that he might be a candidate for lung transplant?  He has a strong faith and practices in the Muslim tradition.  He is quite clear he wants to remain full code and full scope of care.  Overall prognosis guarded to poor.  SUMMARY OF RECOMMENDATIONS   Full code Full scope of care Hopeful for improvement in discharge Time for outcomes Palliative medicine will follow-up in a  couple days  Symptom Management:  Per primary team PMT is available to assist as needed  Prognosis:  Unable to determine  Discharge Planning:  To Be Determined   Discussed with: Patient, family, medical team, nursing team    Thank you for allowing [place] to participate in the care of Richard Davenport PMT will continue to support holistically.  Time Total: 75 min  Detailed review of medical records (labs, imaging, vital signs), medically appropriate exam, discussed with treatment team, counseling and education to patient, family, & staff, documenting clinical information, medication management, coordination of care  Signed by: Wynne Dust, NP Palliative Medicine Team  Team Phone # [phone] (Nights/Weekends)  02/13/2023, 2:27 PM

## 2023-02-13 NOTE — Evaluation (Signed)
Occupational Therapy Evaluation Patient Details Name: Richard Davenport MRN: [id number] DOB: 16-Mar-1960 Today's Date: 02/13/2023   History of Present Illness Pt is a 63 y/o male presenting with SOB in setting of chronic hypoxic respiratory failure and ILD/pulmonary fibrosis. PMH: ILD, pulmonary fibrosis, chronic hypoxic respiratory failure on 5 L O2 [place], Hx of SAH s/p aneurysm coiling, hepatic cirrhosis, HLD, anemia of chronic disease, chronic thrombocytopenia   Clinical Impression   PTA, pt lives with family, typically ambulatory without AD and Independent with ADLs though limited by baseline DOE w/ baseline use of 5 L O2. Pt presents now with deficits in cardiopulmonary endurance and strength. Pt able to sit EOB without assistance but despite increase in O2 to 8 L O2, pt with quick desat to 82% and had to return to supine to recover to > 88%. Pt unable to progress OOB at this time. Pt requires Min A for UB ADL and up to Mod A for LB ADL primarily due to DOE. Initiated discussion re: energy conservation at home, ADL modifications and potential helpful DME. Pt reports family members available to provide assistance at home and hopeful to return home at DC. Noted pending GOC discussions at this time. Will continue to monitor acutely.        If plan is discharge home, recommend the following: A little help with walking and/or transfers;A lot of help with bathing/dressing/bathroom;Assistance with cooking/housework;Assist for transportation    Functional Status Assessment  Patient has had a recent decline in their functional status and demonstrates the ability to make significant improvements in function in a reasonable and predictable amount of time.  Equipment Recommendations  BSC/3in1;Wheelchair (measurements OT);Wheelchair cushion (measurements OT);Other (comment) (TBD)    Recommendations for Other Services       Precautions / Restrictions Precautions Precautions: Fall;Other  (comment) Precaution Comments: monitor O2 (5 L at baseline) Restrictions Weight Bearing Restrictions Per Provider Order: No      Mobility Bed Mobility Overal bed mobility: Needs Assistance Bed Mobility: Supine to Sit, Sit to Supine     Supine to sit: Supervision, HOB elevated Sit to supine: Supervision        Transfers                   General transfer comment: unable due to SOB      Balance Overall balance assessment: Needs assistance Sitting-balance support: No upper extremity supported, Feet supported Sitting balance-Leahy Scale: Good                                     ADL either performed or assessed with clinical judgement   ADL Overall ADL's : Needs assistance/impaired Eating/Feeding: Independent;Sitting   Grooming: Set up;Bed level   Upper Body Bathing: Minimal assistance;Bed level   Lower Body Bathing: Moderate assistance;Bed level   Upper Body Dressing : Minimal assistance;Bed level   Lower Body Dressing: Moderate assistance;Bed level     Toilet Transfer Details (indicate cue type and reason): per NT, pt has transferred to [facility] with limited physical assistance today but very SOB with this task Toileting- Clothing Manipulation and Hygiene: Moderate assistance;Sitting/lateral lean;Sit to/from stand         General ADL Comments: Initiated discussion of ADL modifications, helpful DME to conserve energy     Vision Baseline Vision/History: 1 Wears glasses Ability to See in Adequate Light: 0 Adequate Patient Visual Report: No change from baseline Vision Assessment?: No  apparent visual deficits     Perception         Praxis         Pertinent Vitals/Pain Pain Assessment Pain Assessment: No/denies pain     Extremity/Trunk Assessment Upper Extremity Assessment Upper Extremity Assessment: Generalized weakness;Right hand dominant   Lower Extremity Assessment Lower Extremity Assessment: Defer to PT evaluation    Cervical / Trunk Assessment Cervical / Trunk Assessment: Normal   Communication Communication Communication: No apparent difficulties   Cognition Arousal: Alert Behavior During Therapy: WFL for tasks assessed/performed Overall Cognitive Status: Within Functional Limits for tasks assessed                                       General Comments  daughter at bedside    Exercises     Shoulder Instructions      Home Living Family/patient expects to be discharged to:: Private residence Living Arrangements: Spouse/significant other;Children (1 son and 1 daughter at home) Available Help at Discharge: Family;Available 24 hours/day Type of Home: House Home Access: Stairs to enter [employer] of Steps: 2 Entrance Stairs-Rails: Left Home Layout: One level     Bathroom Shower/Tub: Chief Strategy Officer: Standard Bathroom Accessibility: Yes   Home Equipment: None          Prior Functioning/Environment Prior Level of Function : Independent/Modified Independent;Driving             Mobility Comments: no AD for mobility, very SOB with mobility just to bathroom ADLs Comments: Reports independent with ADLs, standing for showers, managing toileting tasks. pt reports driving children to store but they go in and shop d/t pt inability to walk a long distance        OT Problem List: Decreased strength;Decreased activity tolerance;Impaired balance (sitting and/or standing);Cardiopulmonary status limiting activity;Decreased knowledge of use of DME or AE      OT Treatment/Interventions: Self-care/ADL training;Therapeutic exercise;Energy conservation;DME and/or AE instruction;Therapeutic activities;Patient/family education    OT Goals(Current goals can be found in the care plan section) Acute Rehab OT Goals Patient Stated Goal: improve breathing OT Goal Formulation: With patient/family Time For Goal Achievement: 02/27/23 Potential to Achieve  Goals: Good  OT Frequency: Min 1X/week    Co-evaluation              AM-PAC OT "6 Clicks" Daily Activity     Outcome Measure Help from another person eating meals?: None Help from another person taking care of personal grooming?: A Little Help from another person toileting, which includes using toliet, bedpan, or urinal?: A Lot Help from another person bathing (including washing, rinsing, drying)?: A Lot Help from another person to put on and taking off regular upper body clothing?: A Little Help from another person to put on and taking off regular lower body clothing?: A Lot 6 Click Score: 16   End of Session Equipment Utilized During Treatment: Oxygen Nurse Communication: Mobility status  Activity Tolerance: Treatment limited secondary to medical complications (Comment) Patient left: in bed;with call bell/phone within reach;with family/visitor present  OT Visit Diagnosis: Muscle weakness (generalized) (M62.81);Other (comment) (decreased cardiopulmonary tolerance)                Time: 2130-8657 OT Time Calculation (min): 28 min Charges:  OT General Charges $OT Visit: 1 Visit OT Evaluation $OT Eval Moderate Complexity: 1 Mod OT Treatments $Self Care/Home Management : 8-22 mins  Raynelle Fanning B, OTR/L  Acute Rehab Services Office: [phone]   Lorre Munroe 02/13/2023, 2:39 PM

## 2023-02-13 NOTE — Telephone Encounter (Signed)
Hospital admission for ILD flare, responded well to steroids Please make follow-up appointment with MR in 2 -4 weeks Please ask DME [employer] to provide oxygen concentrator that goes up to 10 L Dr. Lucie Leather, I was unable to send immunodeficiency panel to [facility].  This is difficult to arrange from the hospital.  Best to do it during office visit.  Also better not to send it during IPF flare and when he is on high-dose steroids

## 2023-02-13 NOTE — Progress Notes (Signed)
PROGRESS NOTE    Richard Davenport  QIO:[id number] DOB: 1960/12/24 DOA: 02/11/2023 PCP: Kalman Shan, MD    Brief Narrative:  63 year old gentleman with history of interstitial lung disease/pulmonary fibrosis, chronic hypoxemic respiratory failure on 5 L oxygen at home, history of subarachnoid hemorrhage status post aneurysm coiling, hepatic cirrhosis, hyperlipidemia, anemia of chronic disease and chronic thrombocytopenia presented to the ER with worsening shortness of breath for about 1 week.  Last few days it has been worse.  He does have chronic cough at baseline but now with increased frequency.  Patient on rescue albuterol at home. In the emergency room, initially with respiratory distress and treated with BiPAP but currently on nasal cannula oxygen.  Subjective:  Patient seen and examined.  At rest he thinks he is doing well.  Daughters at the bedside. Patient had a hypoxemic event, 70% on 6 L of oxygen while attempting to get out of the bed to the bedside commode.  Required 12 L oxygen to recover. Discussed about mobilizing with PT OT today.  May need more oxygen at home. Patient also needs palliative care discussions and patient family agreed.   Assessment & Plan:   Acute on chronic hypoxemic respiratory failure secondary to pulmonary fibrosis exacerbation: Extensively investigated and followed by [facility] and pulmonology at [facility].  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  Also has some evidence of cirrhosis. Continue bronchodilator therapy, high-dose IV steroids, inhalational steroids with Pulmicort. Do breathing exercises and incentive spirometry.  Mucolytic's. Antibiotics due to severity of symptoms, will continue azithromycin for 5 days. Supplemental oxygen to keep saturations more than 88%. Due to advanced fibrosis and high short-term mortality, will need goal of care discussion.  Will consult palliative care team to initiate discussion.  Family agrees. Will  start mobilizing with PT OT. From outpatient patient is on rituximab and pirfenidone. Will continue pirfenidone RSV panel negative for COVID, influenza and RSV.  Chronic medical issues including Hepatic cirrhosis with mild ascites, stable.  On Lasix and Aldactone. Anemia of chronic disease, hemoglobin is stable. 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  Goal of care: As above.  Patient will need goal of care discussions.  Palliative care consulted. I explained the severe nature of his lung disease.  Progressively worsening condition and advised to consider DO NOT INTUBATE his status because if he has to go on a ventilator, more than likely he will not be able to be liberated from the ventilator. Also discussed that in this stage of his lung disease, he may benefit with palliation or hospice at home.  They have agreed to meet with palliative care team.   DVT prophylaxis: SCDs Start: 02/12/23 0006   Code Status: Full code Family Communication: Daughter at the bedside. Disposition Plan: Status is: Inpatient Remains inpatient appropriate because: Significant shortness of breath, inpatient treatment needed     Consultants:  Pulmonary Palliative care  Procedures:  None  Antimicrobials:  Azithromycin 1/21---     Objective: Vitals:   02/13/23 0351 02/13/23 0751 02/13/23 0757 02/13/23 1103  BP: 100/68 (!) 100/54  111/73  Pulse: 89 84  (!) 108  Resp: (!) 22 18  20   Temp: 98.4 F (36.9 C) 98.7 F (37.1 C)  98.7 F (37.1 C)  TempSrc: Oral Oral  Oral  SpO2: 99% 93% 98% 92%  Weight: 65.9 kg     Height:        Intake/Output Summary (Last 24  hours) at 02/13/2023 1152 Last data filed at 02/13/2023 1106 Gross per 24 hour  Intake 420 ml  Output 400 ml  Net 20 ml   Filed Weights   02/11/23 1823 02/13/23 0351  Weight: 69.4 kg 65.9 kg    Examination:  General exam: Chronically sick looking.  Mild  distress at rest.  Severe respiratory distress on mobility. Respiratory system: Mostly clear.  Poor inspiratory force. Has expiratory wheezes more on left side than right.  Cardiovascular system: S1 & S2 heard, RRR. No pedal edema. Gastrointestinal system: Soft.  Nontender.  Bowel sound present. Central nervous system: Alert and oriented. No focal neurological deficits. Extremities: Symmetric 5 x 5 power.    Data Reviewed: I have personally reviewed following labs and imaging studies  CBC: Recent Labs  Lab 02/11/23 1835 02/12/23 0030  WBC 9.7 6.3  HGB 9.3* 8.5*  HCT 28.7* 27.2*  MCV 114.3* 115.7*  PLT 128* 109*   Basic Metabolic Panel: Recent Labs  Lab 02/11/23 1835 02/12/23 0030  NA 140 139  K 3.5 4.1  CL 108 108  CO2 22 17*  GLUCOSE 129* 244*  BUN 28* 32*  CREATININE 0.93 1.18  CALCIUM 8.3* 8.7*   GFR: Estimated Creatinine Clearance: 62.1 mL/min (by C-G formula based on SCr of 1.18 mg/dL). Liver Function Tests: Recent Labs  Lab 02/11/23 1835 02/12/23 0030  AST 67* 74*  ALT 37 40  ALKPHOS 245* 242*  BILITOT 1.4* 1.3*  PROT 7.8 7.8  ALBUMIN <1.5* <1.5*   No results for input(s): "LIPASE", "AMYLASE" in the last 168 hours. No results for input(s): "AMMONIA" in the last 168 hours. Coagulation Profile: No results for input(s): "INR", "PROTIME" in the last 168 hours. Cardiac Enzymes: No results for input(s): "CKTOTAL", "CKMB", "CKMBINDEX", "TROPONINI" in the last 168 hours. BNP (last 3 results) Recent Labs    11/05/22 1434  PROBNP 111.0*   HbA1C: No results for input(s): "HGBA1C" in the last 72 hours. CBG: Recent Labs  Lab 02/12/23 0747 02/12/23 1240 02/12/23 1730 02/12/23 2237 02/13/23 0815  GLUCAP 206* 202* 172* 185* 142*   Lipid Profile: No results for input(s): "CHOL", "HDL", "LDLCALC", "TRIG", "CHOLHDL", "LDLDIRECT" in the last 72 hours. Thyroid Function Tests: No results for input(s): "TSH", "T4TOTAL", "FREET4", "T3FREE", "THYROIDAB" in  the last 72 hours. Anemia Panel: No results for input(s): "VITAMINB12", "FOLATE", "FERRITIN", "TIBC", "IRON", "RETICCTPCT" in the last 72 hours. Sepsis Labs: No results for input(s): "PROCALCITON", "LATICACIDVEN" in the last 168 hours.  Recent Results (from the past 240 hours)  Resp panel by RT-PCR (RSV, Flu A&B, Covid) Anterior Nasal Swab     Status: None   Collection Time: 02/11/23  6:29 PM   Specimen: Anterior Nasal Swab  Result Value Ref Range Status   SARS Coronavirus 2 by RT PCR NEGATIVE NEGATIVE Final   Influenza A by PCR NEGATIVE NEGATIVE Final   Influenza B by PCR NEGATIVE NEGATIVE Final    Comment: (NOTE) The Xpert Xpress SARS-CoV-2/FLU/RSV plus assay is intended as an aid in the diagnosis of influenza from Nasopharyngeal swab specimens and should not be used as a sole basis for treatment. Nasal washings and aspirates are unacceptable for Xpert Xpress SARS-CoV-2/FLU/RSV testing.  Fact Sheet for Patients: BloggerCourse.com  Fact Sheet for Healthcare Providers: SeriousBroker.it  This test is not yet approved or cleared by the [place] FDA and has been authorized for detection and/or diagnosis of SARS-CoV-2 by FDA under an Emergency Use Authorization (EUA). This EUA will remain in effect (meaning this  test can be used) for the duration of the COVID-19 declaration under Section 564(b)(1) of the Act, 21 U.S.C. section 360bbb-3(b)(1), unless the authorization is terminated or revoked.     Resp Syncytial Virus by PCR NEGATIVE NEGATIVE Final    Comment: (NOTE) Fact Sheet for Patients: BloggerCourse.com  Fact Sheet for Healthcare Providers: SeriousBroker.it  This test is not yet approved or cleared by the [place] FDA and has been authorized for detection and/or diagnosis of SARS-CoV-2 by FDA under an Emergency Use Authorization (EUA). This EUA will remain in  effect (meaning this test can be used) for the duration of the COVID-19 declaration under Section 564(b)(1) of the Act, 21 U.S.C. section 360bbb-3(b)(1), unless the authorization is terminated or revoked.  Performed at [facility], [address] [place], [place] [address]   Respiratory (~20 pathogens) panel by PCR     Status: None   Collection Time: 02/12/23 12:07 AM   Specimen: Nasopharyngeal Swab; Respiratory  Result Value Ref Range Status   Adenovirus NOT DETECTED NOT DETECTED Final   Coronavirus 229E NOT DETECTED NOT DETECTED Final    Comment: (NOTE) The Coronavirus on the Respiratory Panel, DOES NOT test for the novel  Coronavirus (2019 nCoV)    Coronavirus HKU1 NOT DETECTED NOT DETECTED Final   Coronavirus NL63 NOT DETECTED NOT DETECTED Final   Coronavirus OC43 NOT DETECTED NOT DETECTED Final   Metapneumovirus NOT DETECTED NOT DETECTED Final   Rhinovirus / Enterovirus NOT DETECTED NOT DETECTED Final   Influenza A NOT DETECTED NOT DETECTED Final   Influenza B NOT DETECTED NOT DETECTED Final   Parainfluenza Virus 1 NOT DETECTED NOT DETECTED Final   Parainfluenza Virus 2 NOT DETECTED NOT DETECTED Final   Parainfluenza Virus 3 NOT DETECTED NOT DETECTED Final   Parainfluenza Virus 4 NOT DETECTED NOT DETECTED Final   Respiratory Syncytial Virus NOT DETECTED NOT DETECTED Final   Bordetella pertussis NOT DETECTED NOT DETECTED Final   Bordetella Parapertussis NOT DETECTED NOT DETECTED Final   Chlamydophila pneumoniae NOT DETECTED NOT DETECTED Final   Mycoplasma pneumoniae NOT DETECTED NOT DETECTED Final    Comment: Performed at [facility], [address] [place], [place] [address]         Radiology Studies: CT ANGIO CHEST PE W OR WO CONTRAST Result Date: 02/11/2023 CLINICAL DATA:  Pulmonary embolism (PE) suspected, high prob. Shortness of breath. EXAM: CT ANGIOGRAPHY CHEST WITH CONTRAST TECHNIQUE: Multidetector CT imaging of the chest was performed using the  standard protocol during bolus administration of intravenous contrast. Multiplanar CT image reconstructions and MIPs were obtained to evaluate the vascular anatomy. RADIATION DOSE REDUCTION: This exam was performed according to the departmental dose-optimization program which includes automated exposure control, adjustment of the mA and/or kV according to patient size and/or use of iterative reconstruction technique. CONTRAST:  75mL OMNIPAQUE IOHEXOL 350 MG/ML SOLN COMPARISON:  05/19/2022 FINDINGS: Cardiovascular: No filling defects in the pulmonary arteries to suggest pulmonary emboli. Heart borderline in size. Aorta normal caliber. Mediastinum/Nodes: No mediastinal, hilar, or axillary adenopathy. Trachea and esophagus are unremarkable. Thyroid unremarkable. Lungs/Pleura: Changes of fibrosis throughout the lungs, similar to prior study. No definite acute process. No effusions. Upper Abdomen: Cirrhosis.  Large volume upper abdominal ascites. Musculoskeletal: Bilateral gynecomastia.  No acute bony abnormality. Review of the MIP images confirms the above findings. IMPRESSION: No evidence of pulmonary embolus. Very low lung volumes with changes of severe fibrosis. No definite acute process. Borderline cardiomegaly. Cirrhosis, large volume upper abdominal ascites. Electronically  Signed   By: Charlett Nose M.D.   On: 02/11/2023 22:06   DG Chest Port 1 View Result Date: 02/11/2023 CLINICAL DATA:  Shortness of breath EXAM: PORTABLE CHEST 1 VIEW COMPARISON:  02/02/2023 FINDINGS: Heart and mediastinal contours are stable. Very low lung volumes with chronic fibrotic changes throughout the lungs. No definite acute process or effusions. No acute bony abnormality. IMPRESSION: Very low lung volumes with stable chronic fibrosis. No definite acute process. Electronically Signed   By: Charlett Nose M.D.   On: 02/11/2023 18:50        Scheduled Meds:  arformoterol  15 mcg Nebulization BID   azithromycin  500 mg Oral Daily    budesonide (PULMICORT) nebulizer solution  0.25 mg Nebulization BID   feeding supplement  237 mL Oral BID BM   furosemide  20 mg Oral Daily   gabapentin  300 mg Oral TID   guaiFENesin  600 mg Oral BID   insulin aspart  0-9 Units Subcutaneous TID WC   methylPREDNISolone (SOLU-MEDROL) injection  60 mg Intravenous Q12H   multivitamin with minerals  1 tablet Oral Daily   pantoprazole  40 mg Oral Daily   Pirfenidone  534 mg Oral TID with meals   sodium chloride flush  3 mL Intravenous Q12H   spironolactone  50 mg Oral Daily   Continuous Infusions:   LOS: 1 day    Time spent: 40 minutes     Dorcas Carrow, MD [facility]

## 2023-02-14 ENCOUNTER — Inpatient Hospital Stay (HOSPITAL_COMMUNITY): Payer: Medicaid Other

## 2023-02-14 DIAGNOSIS — D696 Thrombocytopenia, unspecified: Secondary | ICD-10-CM | POA: Diagnosis not present

## 2023-02-14 DIAGNOSIS — E1165 Type 2 diabetes mellitus with hyperglycemia: Secondary | ICD-10-CM

## 2023-02-14 DIAGNOSIS — J9621 Acute and chronic respiratory failure with hypoxia: Secondary | ICD-10-CM | POA: Diagnosis not present

## 2023-02-14 DIAGNOSIS — J849 Interstitial pulmonary disease, unspecified: Secondary | ICD-10-CM | POA: Diagnosis not present

## 2023-02-14 DIAGNOSIS — K7460 Unspecified cirrhosis of liver: Secondary | ICD-10-CM

## 2023-02-14 LAB — GLUCOSE, CAPILLARY
Glucose-Capillary: 159 mg/dL — ABNORMAL HIGH (ref 70–99)
Glucose-Capillary: 154 mg/dL — ABNORMAL HIGH (ref 70–99)
Glucose-Capillary: 242 mg/dL — ABNORMAL HIGH (ref 70–99)
Glucose-Capillary: 136 mg/dL — ABNORMAL HIGH (ref 70–99)

## 2023-02-14 LAB — CBC WITH DIFFERENTIAL/PLATELET
Abs Immature Granulocytes: 0 10*3/uL (ref 0.00–0.07)
Basophils Absolute: 0 10*3/uL (ref 0.0–0.1)
Basophils Relative: 0 %
Eosinophils Absolute: 0 10*3/uL (ref 0.0–0.5)
Eosinophils Relative: 0 %
HCT: 25.9 % — ABNORMAL LOW (ref 39.0–52.0)
Hemoglobin: 8.4 g/dL — ABNORMAL LOW (ref 13.0–17.0)
Lymphocytes Relative: 0 %
Lymphs Abs: 0 10*3/uL — ABNORMAL LOW (ref 0.7–4.0)
MCH: 36.2 pg — ABNORMAL HIGH (ref 26.0–34.0)
MCHC: 32.4 g/dL (ref 30.0–36.0)
MCV: 111.6 fL — ABNORMAL HIGH (ref 80.0–100.0)
Monocytes Absolute: 0 10*3/uL — ABNORMAL LOW (ref 0.1–1.0)
Monocytes Relative: 0 %
Neutro Abs: 10.1 10*3/uL — ABNORMAL HIGH (ref 1.7–7.7)
Neutrophils Relative %: 100 %
Platelets: 114 10*3/uL — ABNORMAL LOW (ref 150–400)
RBC: 2.32 MIL/uL — ABNORMAL LOW (ref 4.22–5.81)
RDW: 15.5 % (ref 11.5–15.5)
WBC: 10.1 10*3/uL (ref 4.0–10.5)
nRBC: 0 % (ref 0.0–0.2)
nRBC: 0 /100{WBCs}

## 2023-02-14 LAB — COMPREHENSIVE METABOLIC PANEL
ALT: 45 U/L — ABNORMAL HIGH (ref 0–44)
AST: 69 U/L — ABNORMAL HIGH (ref 15–41)
Albumin: <1.5 g/dL — ABNORMAL LOW (ref 3.5–5.0)
Alkaline Phosphatase: 203 U/L — ABNORMAL HIGH (ref 38–126)
Anion gap: 7 (ref 5–15)
BUN: 34 mg/dL — ABNORMAL HIGH (ref 6–20)
CO2: 24 mmol/L (ref 22–32)
Calcium: 8.3 mg/dL — ABNORMAL LOW (ref 8.9–10.3)
Chloride: 107 mmol/L (ref 98–111)
Creatinine, Ser: 0.94 mg/dL (ref 0.61–1.24)
GFR, Estimated: >60 mL/min (ref >60)
Glucose, Bld: 169 mg/dL — ABNORMAL HIGH (ref 70–99)
Potassium: 3.7 mmol/L (ref 3.5–5.1)
Sodium: 138 mmol/L (ref 135–145)
Total Bilirubin: 0.7 mg/dL (ref 0.0–1.2)
Total Protein: 7.4 g/dL (ref 6.5–8.1)

## 2023-02-14 LAB — MAGNESIUM: Magnesium: 1.8 mg/dL (ref 1.7–2.4)

## 2023-02-14 LAB — PHOSPHORUS: Phosphorus: 2.3 mg/dL — ABNORMAL LOW (ref 2.5–4.6)

## 2023-02-14 MED ORDER — K PHOS MONO-SOD PHOS DI & MONO 155-852-130 MG PO TABS
500.0000 mg | ORAL_TABLET | Freq: Once | ORAL | Status: AC
  Administered 2023-02-14: 500 mg via ORAL
  Filled 2023-02-14: qty 2

## 2023-02-14 MED ORDER — SODIUM CHLORIDE 0.9 % IV SOLN
1.0000 g | INTRAVENOUS | Status: DC
  Administered 2023-02-14: 1 g via INTRAVENOUS
  Filled 2023-02-14: qty 10

## 2023-02-14 MED ORDER — CALCIUM CARBONATE ANTACID 500 MG PO CHEW
1.0000 | CHEWABLE_TABLET | Freq: Two times a day (BID) | ORAL | Status: DC | PRN
  Administered 2023-02-14: 200 mg via ORAL
  Filled 2023-02-14: qty 1

## 2023-02-14 MED ORDER — MAGNESIUM SULFATE 2 GM/50ML IV SOLN
2.0000 g | Freq: Once | INTRAVENOUS | Status: AC
  Administered 2023-02-14: 2 g via INTRAVENOUS
  Filled 2023-02-14: qty 50

## 2023-02-14 MED ORDER — GUAIFENESIN ER 600 MG PO TB12
1200.0000 mg | ORAL_TABLET | Freq: Two times a day (BID) | ORAL | Status: DC
  Administered 2023-02-14 – 2023-02-15 (×2): 1200 mg via ORAL
  Filled 2023-02-14 (×2): qty 2

## 2023-02-14 NOTE — Progress Notes (Signed)
[facility] night cross cover note:   I was notified by RN of the patient's request for medication for heartburn.  I subsequently ordered prn Tums for heartburn.   Newton Pigg, DO Hospitalist

## 2023-02-14 NOTE — Evaluation (Signed)
Physical Therapy Evaluation Patient Details Name: Richard Davenport MRN: [id number] DOB: Feb 27, 1960 Today's Date: 02/14/2023  History of Present Illness  Pt is a 63 y.o. male admitted 02/13/23 with acute on chronic hypoxic respiratory failure secondary to pulmonary fibrosis exacerbation, and others. Pt presented with progressive shortness of breath and coughing. PMH significant for ILD/PF, chronic hypoxemic respiratory failure on 5 L oxygen at home, history of subarachnoid hemorrhage s/p aneurysm coiling, hepatic cirrhosis, hyperlipidemia, anemia of chronic disease and chronic thrombocytopenia.   Clinical Impression  Pt admitted with above diagnosis. PTA, pt lived at home with family and was independent with all ADLs, received assistance from family for IADLs, and ambulated without an AD. Pt utilized supplemental O2 at baseline, 5L at rest and up to 8L with activity. Pt currently with functional limitations due to the deficits listed below (see PT Problem List). Pt completed bed mobility with Sup, sat EOB with Sup, and required CGA for STS. Evaluation limited secondary to pt desaturation with standing. Unable to further mobility at this time, DME recommendation TBD pending pt's progress.  Pt will benefit from acute skilled PT to increase his independence and safety with mobility to allow discharge. Recommend Home with HHPT.        Vitals Pt on 9L HFNC at start of session Supine: BP 144/72 (84) Sitting EOB: BP 133/93 (105) HR: 92-98 bpm SpO2: pt desaturated to mid-80s with each standing attempt, required prolonged seated rest breaks and O2 to be titrated up to 11L for recovery >90%.  Pt denied SOB, DOE, dizziness, and lightheadedness.        If plan is discharge home, recommend the following: A lot of help with walking and/or transfers;Assistance with cooking/housework;Direct supervision/assist for medications management;Direct supervision/assist for financial management;Assist for  transportation;Help with stairs or ramp for entrance   Can travel by private vehicle        Equipment Recommendations  (TBD pending pt progress)  Recommendations for Other Services       Functional Status Assessment Patient has had a recent decline in their functional status and demonstrates the ability to make significant improvements in function in a reasonable and predictable amount of time.     Precautions / Restrictions Precautions Precautions: Fall;Other (comment) Precaution Comments: monitor O2 (Pt wears O2 at baseline, 5L in bed and 8L with mobility) Restrictions Weight Bearing Restrictions Per Provider Order: No      Mobility  Bed Mobility Overal bed mobility: Needs Assistance Bed Mobility: Supine to Sit, Sit to Supine     Supine to sit: Used rails, HOB elevated, Supervision Sit to supine: Supervision        Transfers Overall transfer level: Needs assistance Equipment used: None Transfers: Sit to/from Stand Sit to Stand: Contact guard assist           General transfer comment: Pt performed STS from EOB x2 with prolonged seated rest break between attempts. Initially pt on 9L HFNC and desat into mid-80s. Increased O2 to 10L for pt to recover into the 90s. Tried standing again with repeated desat into mid-80s and O2 increased to 11L to recover to >90%.    Ambulation/Gait               General Gait Details: Unable to attempt secondary to SpO2  Stairs            Wheelchair Mobility     Tilt Bed    Modified Rankin (Stroke Patients Only)       Balance Overall balance  assessment: Needs assistance Sitting-balance support: No upper extremity supported, Feet supported Sitting balance-Leahy Scale: Good Sitting balance - Comments: Pt sat EOB ~63mins       Standing balance comment: Pt stood without an AD, intermittent UE support on PT. Unable to assess standing balance, d/t limited duration secondary to desat                              Pertinent Vitals/Pain Pain Assessment Pain Assessment: No/denies pain    Home Living Family/patient expects to be discharged to:: Private residence Living Arrangements: Children (1 son and 1 daughter) Available Help at Discharge: Family;Available 24 hours/day Type of Home: House Home Access: Stairs to enter Entrance Stairs-Rails: None Entrance Stairs-Number of Steps: 2   Home Layout: One level Home Equipment: None      Prior Function Prior Level of Function : Independent/Modified Independent;Driving             Mobility Comments: Pt ambulated without an AD in his home. Per daughter, pt has a pedal machine that he will use when seated in a chair. Pt requires increase O2 with activity, titrating up to 8L. ADLs Comments: Pt states he is independent with all ADLs. He will drive sometimes, but his family will do the shopping. Pt states his daughter manages medications and finances for him.     Extremity/Trunk Assessment   Upper Extremity Assessment Upper Extremity Assessment: Defer to OT evaluation    Lower Extremity Assessment Lower Extremity Assessment: Generalized weakness    Cervical / Trunk Assessment Cervical / Trunk Assessment: Normal  Communication   Communication Communication: No apparent difficulties Cueing Techniques: Verbal cues;Tactile cues  Cognition Arousal: Alert Behavior During Therapy: WFL for tasks assessed/performed Overall Cognitive Status: Within Functional Limits for tasks assessed                                          General Comments General comments (skin integrity, edema, etc.): [name] son and daughter at bedside. Pt greeted on 9L HFNC. Pt desat with activity requiring increased O2 to 11-12L for recover. Pt denied SOB, DOE, dizziness, and lightheadedness. Supine BP 144/72 (84). Seated BP 133/93 (98).    Exercises     Assessment/Plan    PT Assessment Patient needs continued PT services  PT Problem List  Decreased strength;Decreased activity tolerance;Decreased balance;Decreased mobility;Cardiopulmonary status limiting activity       PT Treatment Interventions DME instruction;Gait training;Stair training;Functional mobility training;Therapeutic activities;Therapeutic exercise;Balance training;Neuromuscular re-education;Patient/family education    PT Goals (Current goals can be found in the Care Plan section)  Acute Rehab PT Goals Patient Stated Goal: Return home PT Goal Formulation: With patient/family Time For Goal Achievement: 02/28/23 Potential to Achieve Goals: Good    Frequency Min 1X/week     Co-evaluation               AM-PAC PT "6 Clicks" Mobility  Outcome Measure Help needed turning from your back to your side while in a flat bed without using bedrails?: A Little Help needed moving from lying on your back to sitting on the side of a flat bed without using bedrails?: A Little Help needed moving to and from a bed to a chair (including a wheelchair)?: A Little Help needed standing up from a chair using your arms (e.g., wheelchair or bedside chair)?: A Little Help needed  to walk in hospital room?: A Lot Help needed climbing 3-5 steps with a railing? : A Lot 6 Click Score: 16    End of Session Equipment Utilized During Treatment: Gait belt;Oxygen Activity Tolerance: Treatment limited secondary to medical complications (Comment) (Pt desat with activity.) Patient left: in bed;with bed alarm set;with call bell/phone within reach;with family/visitor present Nurse Communication: Mobility status;Other (comment) (RN notified of pt's VS and increased O2 requirement to maintain SpO2 >90%.) PT Visit Diagnosis: Muscle weakness (generalized) (M62.81);Unsteadiness on feet (R26.81)    Time: 0981-1914 PT Time Calculation (min) (ACUTE ONLY): 18 min   Charges:   PT Evaluation $PT Eval Moderate Complexity: 1 Mod   PT General Charges $$ ACUTE PT VISIT: 1 Visit         Cheri Guppy, PT, DPT Acute Rehabilitation Services Office: [phone] Secure Chat Preferred   [name] 02/14/2023, 11:00 AM

## 2023-02-14 NOTE — TOC Initial Note (Addendum)
Transition of Care [facility]) - Initial/Assessment Note    Patient Details  Name: Richard Davenport MRN: [id number] Date of Birth: May 16, 1960  Transition of Care [facility]) CM/SW Contact:    Gala Lewandowsky, RN Phone Number: 02/14/2023, 12:12 PM  Clinical Narrative: Patient presented for shortness of breath-has oxygen via [facility] at 3 liters. PTA patient was from home with spouse and children. Per daughter that was in the room during visit, patient is on 5 liters lying and 8 liters ambulatory. Patient has a 5 liter concentrator in the home. [employer] made aware that the patient is currently on 10 L and they will follow for increased oxygen needs. Staff RN to complete documentation for sitting sat for increased oxygen qualifications. Patient is agreeable to home health PT-[facility] agreeable to service the patient. Start of care to begin within 24-48 hours post transition home. No further needs identified at this time.                1258 02-14-23 Family declines bedside commode and wheel chair at this time.  Expected Discharge Plan: Home w Home Health Services Barriers to Discharge: Continued Medical Work up (oxygen 10 Liters)   Patient Goals and CMS Choice Patient states their goals for this hospitalization and ongoing recovery are:: plan for home once stable.   Expected Discharge Plan and Services   Discharge Planning Services: CM Consult Post Acute Care Choice: Home Health, Durable Medical Equipment Living arrangements for the past 2 months: Single Family Home                 DME Arranged: Oxygen DME Agency: Patsy Lager Date DME Agency Contacted: 02/14/23 Time DME Agency Contacted: 1211 Representative spoke with at DME Agency: Morrie Sheldon HH Arranged: PT HH Agency: [facility] Date [facility] Agency Contacted: 02/14/23 Time HH Agency Contacted: 1212 Representative spoke with at [facility] Agency: Cindie  Prior Living Arrangements/Services Living arrangements for the past 2 months: Single  Family Home Lives with:: Spouse, Adult Children Patient language and need for interpreter reviewed:: Yes Do you feel safe going back to the place where you live?: Yes      Need for Family Participation in Patient Care: Yes (Comment) Care giver support system in place?: Yes (comment) Current home services: DME (Oxygen with [employer]) Criminal Activity/Legal Involvement Pertinent to Current Situation/Hospitalization: No - Comment as needed  Activities of Daily Living   ADL Screening (condition at time of admission) Independently performs ADLs?: Yes (appropriate for developmental age) Is the patient deaf or have difficulty hearing?: No Does the patient have difficulty seeing, even when wearing glasses/contacts?: No Does the patient have difficulty concentrating, remembering, or making decisions?: No  Permission Sought/Granted Permission sought to share information with : Facility Medical sales representative, Case Manager, Family Supports Permission granted to share information with : Yes, Verbal Permission Granted     Permission granted to share info w AGENCY: [facility]        Emotional Assessment Appearance:: Appears stated age Attitude/Demeanor/Rapport: Engaged Affect (typically observed): Appropriate Orientation: : Oriented to  Time, Oriented to Place, Oriented to Self Alcohol / Substance Use: Not Applicable Psych Involvement: No (comment)  Admission diagnosis:  SOB (shortness of breath) [R06.02] Acute on chronic respiratory failure with hypoxia (HCC) [J96.21] Patient Active Problem List   Diagnosis Date Noted   Acute on chronic respiratory failure with hypoxia (HCC) 02/12/2023   Type 2 diabetes mellitus (HCC) 02/12/2023   Lung disease, interstitial (HCC) 12/25/2022   Protein-calorie malnutrition, severe 12/20/2022  Acute respiratory failure with hypercapnia (HCC) 12/19/2022   Nausea & vomiting 12/19/2022   Elevated LFTs 05/19/2022   Cirrhosis (HCC) 05/19/2022   Elevated  IgE level 05/09/2022   Evidence of airways hyperreactivity without diagnosis of asthma 05/09/2022   Abnormal liver enzymes 05/09/2022   Idiopathic pulmonary fibrosis (HCC) 02/18/2022   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 04/10/2018   Type 2 diabetes mellitus with hyperglycemia (HCC) 04/10/2018   Hyperlipidemia 04/10/2018   Obesity 04/10/2018   Hx of basliar aneurysm coil and stent 04/10/2018   Thrombocytopenia (HCC) 04/10/2018   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   AKI (acute kidney injury) (HCC) 08/27/2013   Dizziness and giddiness 08/27/2013   TIA (transient ischemic attack) 08/26/2013   Cerebral aneurysm, nonruptured 05/27/2013   Chronic respiratory failure with hypoxia (HCC) - 3L/min 10/10/2012   hx SAH (subarachnoid hemorrhage) (HCC) 10/09/2012   Altered mental status 10/09/2012   PCP:  Kalman Shan, MD Pharmacy:   [id number] - [place], [place] - [address] [place] [address] Phone: [phone] Fax: [phone]  [facility] - [place], [place] - [address] [place] [address] Phone: [phone] Fax: [phone]  [facility] - [place], [place] - [address] [place] [address] Phone: [phone] Fax: [phone]  [facility] [address] [place] [address] Phone: [phone] Fax: [phone]  Social Drivers of Health (SDOH) Social History: SDOH Screenings   Food Insecurity: No Food Insecurity (02/12/2023)  Housing: Low Risk  (02/12/2023)  Transportation Needs: No Transportation Needs (02/12/2023)  Utilities: Not At Risk (02/12/2023)  Depression (PHQ2-9): Low Risk  (01/01/2023)  Financial Resource Strain: Low Risk  (05/24/2022)   [facility] from [facility]  Physical Activity: Not on File (05/10/2021)   Received from [place], [place]   Social Connections: Not on File (10/05/2022)   Received from [facility]  Stress: Not on File (05/10/2021)   Received from [place], [place]  Tobacco Use: Medium Risk (02/11/2023)   Readmission Risk Interventions     No data to display

## 2023-02-14 NOTE — Progress Notes (Signed)
PROGRESS NOTE    Richard Davenport  ZOX:[id number] DOB: 22-Feb-1960 DOA: 02/11/2023 PCP: Kalman Shan, MD   Brief Narrative:  Richard Davenport is a 63 y.o. male with medical history significant for ILD/pulmonary fibrosis, chronic hypoxic respiratory failure on 5 L O2 [place], Hx of SAH s/p aneurysm coiling, hepatic cirrhosis, HLD, anemia of chronic disease, chronic thrombocytopenia who is admitted for acute on chronic hypoxic respiratory failure in setting of ILD/pulmonary fibrosis.  He was consulted and medication recommendations have been made.  Unfortunately he continues to require quite a bit of oxygen and will need to ensure that his oxygen concentrator goes up to at least 10 L at home.  Will need to see his progress and continue to monitor his respiratory status carefully  Assessment and Plan:  Acute on Chronic Hypoxemic Respiratory Failure secondary to pulmonary fibrosis exacerbation and ILD flare: -Extensively investigated and followed by [facility] and pulmonology at [facility].   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  Also has some evidence of cirrhosis. -Continue bronchodilator therapy, high-dose IV steroids sith Solumedrol 60 mg q12g, inhalational steroids with Pulmicort and continue with Arformoterol 15 mcg Neb BID -C/w Duoneb 3 mL Neb q6h -Do breathing exercises and incentive spirometry.  Mucolytic's. -Antibiotics due to severity of symptoms, will continue azithromycin for 5 days and Add Ceftriaxone SpO2: 95 % O2 Flow Rate (L/min): 9 L/min FiO2 (%): 40 % -Supplemental oxygen to keep saturations more than 88%. -Due to advanced fibrosis and high short-term mortality, will need goal of care discussion.  Will consult palliative care team to initiate discussion.  Family agrees. -Will start mobilizing with PT OT and they are recommending Home Health PT/OT -From outpatient patient is on rituximab and pirfenidone. Will continue pirfenidone -RSV panel negative for COVID, influenza  and RSV. -Repeat CXR done and showed "No significant change.Marland Kitchen RIGHT upper lobe airspace pneumonia. Very low lung volumes" -Will need to ensure that his home oxygen 100 goes up to 10 L and have TOC to help with this. -At discharge he was transitioned to 40 mg of p.o. prednisone and taper over 2 weeks down to baseline dose of 10 mg.  For now we will continue high-dose steroids   Hypophosphatemia -Phos Level Trend: Recent Labs  Lab 02/14/23 1039  PHOS 2.3*  -Replete with po K Phos Neutral 500 mg x1 -Repeat Phos in the AM  Hepatic cirrhosis with mild ascites Abnormal LFTs -Stable.  On Lasix 20 mg po Daily and Spironlactone 50 mg po daily which are being continued -AST/ALT and Bilirubin Trend: Recent Labs  Lab 02/11/23 1835 02/12/23 0030 02/14/23 1039  AST 67* 74* 69*  ALT 37 40 45*  BILITOT 1.4* 1.3* 0.7  -Continue to Monitor and Trend and repeat CMP in the AM  Macrocytic Anemia/Anemia of Chronic Disease -Hemoglobin is stable. -Hgb/Hct Trend: Recent Labs  Lab 02/02/23 1430 02/11/23 1835 02/12/23 0030 02/14/23 1039  HGB 9.2* 9.3* 8.5* 8.4*  HCT 29.4* 28.7* 27.2* 25.9*  MCV 118.1* 114.3* 115.7* 111.6*  -Check Anemia Panel in the AM -Continue to Monitor for S/Sx of Bleeding; No overt bleeding noted -Repeat CBC in the AM  Hypergammaglobulinemia -They were unable to send an immunodeficiency panel in the hospital and will not reduce from office  Chronic thrombocytopenia due to liver disease, stable. 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 -CBG Trend: Recent Labs  Lab 02/13/23 0815 02/13/23 1225 02/13/23 1647 02/13/23 2107 02/14/23 0809 02/14/23 1215 02/14/23 1608  GLUCAP 142* 205* 122* 170* 159* 154* 242*   Hypoalbuminemia -Patient's Albumin  Trend: Recent Labs  Lab 02/11/23 1835 02/12/23 0030 02/14/23 1039  ALBUMIN <1.5* <1.5* <1.5*  -Continue to Monitor and Trend and repeat CMP in the AM  GERD/GI Prophylaxis -C/w Pantoprazole 40 mg po Daily   Goal of care: As above.  -Patient will need goal of care discussions.  Palliative care consulted. I explained the severe nature of his lung disease.  Progressively worsening condition and advised to consider DO NOT INTUBATE his status because if he has to go on a ventilator, more than likely he will not be able to be liberated from the ventilator. Also discussed that in this stage of his lung disease, he may benefit with palliation or hospice at home.  They have agreed to meet with palliative care team.    DVT prophylaxis: SCDs Start: 02/12/23 0006    Code Status: Full Code Family Communication: Gust with the patient's son at bedside  Disposition Plan:  Level of care: Progressive Status is: Inpatient Remains inpatient appropriate because: Needs further clinical improvement   Consultants:  Palliative care medicine Pulmonary  Procedures:  As delineated as above  Antimicrobials:  Anti-infectives (From admission, onward)    Start     Dose/Rate Route Frequency Ordered Stop   02/14/23 1945  cefTRIAXone (ROCEPHIN) 1 g in sodium chloride 0.9 % 100 mL IVPB        1 g 200 mL/hr over 30 Minutes Intravenous Every 24 hours 02/14/23 1851     02/13/23 0100  azithromycin (ZITHROMAX) tablet 500 mg       Placed in "Followed by" Linked Group   500 mg Oral Daily 02/12/23 0008 02/17/23 0959   02/12/23 0100  azithromycin (ZITHROMAX) 500 mg in sodium chloride 0.9 % 250 mL IVPB       Placed in "Followed by" Linked Group   500 mg 250 mL/hr over 60 Minutes Intravenous Every 24 hours 02/12/23 0008 02/12/23 0249       Subjective: Seen and examined at bedside thinks he is doing better however he still continues to require quite a bit of oxygen.  We are attempting to have the TOC confirmed  that his oxygen concentrator can go up to 10 L at home.  Continues to be on his steroids.  Chest x-ray continues to show right upper lobe infiltrate and we have now added IV ceftriaxone.  Will continue to monitor and see how he does and if he improves can likely be discharged home with home health services.  Objective: Vitals:   02/14/23 1242 02/14/23 1243 02/14/23 1244 02/14/23 1606  BP:    111/80  Pulse: (!) 101 99 99   Resp:      Temp:    98.1 F (36.7 C)  TempSrc:    Oral  SpO2: 91% 94% 95%   Weight:      Height:        Intake/Output Summary (Last 24 hours) at 02/14/2023 1854 Last data filed at 02/14/2023 1200 Gross per 24 hour  Intake --  Output 425 ml  Net -425 ml   Filed Weights   02/11/23 1823 02/13/23 0351 02/14/23 0536  Weight: 69.4 kg 65.9 kg 66.5 kg   Examination: Physical Exam:  Constitutional: Thin chronically ill-appearing male in no  acute distress Respiratory: Diminished to auscultation bilaterally with some coarse breath sounds, no wheezing, rales, rhonchi or crackles. Normal respiratory effort and patient is not tachypenic. No accessory muscle use.  Wearing supplemental oxygen via nasal cannula Cardiovascular: RRR, no murmurs / rubs / gallops. S1 and S2 auscultated. No extremity edema.  Abdomen: Soft, non-tender, non-distended. Bowel sounds positive.  GU: Deferred. Musculoskeletal: No clubbing / cyanosis of digits/nails. No joint deformity upper and lower extremities.  Skin: No rashes, lesions, ulcers on limited skin evaluation. No induration; Warm and dry.  Neurologic: CN 2-12 grossly intact with no focal deficits.  Romberg sign and cerebellar reflexes not assessed.  Psychiatric: Normal judgment and insight. Alert and oriented x 3. Normal mood and appropriate affect.   Data Reviewed: I have personally reviewed following labs and imaging studies  CBC: Recent Labs  Lab 02/11/23 1835 02/12/23 0030 02/14/23 1039  WBC 9.7 6.3 10.1  NEUTROABS  --   --   10.1*  HGB 9.3* 8.5* 8.4*  HCT 28.7* 27.2* 25.9*  MCV 114.3* 115.7* 111.6*  PLT 128* 109* 114*   Basic Metabolic Panel: Recent Labs  Lab 02/11/23 1835 02/12/23 0030 02/14/23 1039  NA 140 139 138  K 3.5 4.1 3.7  CL 108 108 107  CO2 22 17* 24  GLUCOSE 129* 244* 169*  BUN 28* 32* 34*  CREATININE 0.93 1.18 0.94  CALCIUM 8.3* 8.7* 8.3*  MG  --   --  1.8  PHOS  --   --  2.3*   GFR: Estimated Creatinine Clearance: 78.6 mL/min (by C-G formula based on SCr of 0.94 mg/dL). Liver Function Tests: Recent Labs  Lab 02/11/23 1835 02/12/23 0030 02/14/23 1039  AST 67* 74* 69*  ALT 37 40 45*  ALKPHOS 245* 242* 203*  BILITOT 1.4* 1.3* 0.7  PROT 7.8 7.8 7.4  ALBUMIN <1.5* <1.5* <1.5*   No results for input(s): "LIPASE", "AMYLASE" in the last 168 hours. No results for input(s): "AMMONIA" in the last 168 hours. Coagulation Profile: No results for input(s): "INR", "PROTIME" in the last 168 hours. Cardiac Enzymes: No results for input(s): "CKTOTAL", "CKMB", "CKMBINDEX", "TROPONINI" in the last 168 hours. BNP (last 3 results) Recent Labs    11/05/22 1434  PROBNP 111.0*   HbA1C: No results for input(s): "HGBA1C" in the last 72 hours. CBG: Recent Labs  Lab 02/13/23 1647 02/13/23 2107 02/14/23 0809 02/14/23 1215 02/14/23 1608  GLUCAP 122* 170* 159* 154* 242*   Lipid Profile: No results for input(s): "CHOL", "HDL", "LDLCALC", "TRIG", "CHOLHDL", "LDLDIRECT" in the last 72 hours. Thyroid Function Tests: No results for input(s): "TSH", "T4TOTAL", "FREET4", "T3FREE", "THYROIDAB" in the last 72 hours. Anemia Panel: No results for input(s): "VITAMINB12", "FOLATE", "FERRITIN", "TIBC", "IRON", "RETICCTPCT" in the last 72 hours. Sepsis Labs: No results for input(s): "PROCALCITON", "LATICACIDVEN" in the last 168 hours.  Recent Results (from the past 240 hours)  Resp panel by RT-PCR (RSV, Flu A&B, Covid) Anterior Nasal Swab     Status: None   Collection Time: 02/11/23  6:29 PM    Specimen: Anterior Nasal Swab  Result Value Ref Range Status   SARS Coronavirus 2 by RT PCR NEGATIVE NEGATIVE Final   Influenza A by PCR NEGATIVE NEGATIVE Final   Influenza B by PCR NEGATIVE NEGATIVE Final    Comment: (NOTE) The Xpert Xpress SARS-CoV-2/FLU/RSV plus assay is intended as an aid in the diagnosis of influenza from Nasopharyngeal swab specimens and should not be used as a sole basis for treatment. Nasal washings and  aspirates are unacceptable for Xpert Xpress SARS-CoV-2/FLU/RSV testing.  Fact Sheet for Patients: BloggerCourse.com  Fact Sheet for Healthcare Providers: SeriousBroker.it  This test is not yet approved or cleared by the [place] FDA and has been authorized for detection and/or diagnosis of SARS-CoV-2 by FDA under an Emergency Use Authorization (EUA). This EUA will remain in effect (meaning this test can be used) for the duration of the COVID-19 declaration under Section 564(b)(1) of the Act, 21 U.S.C. section 360bbb-3(b)(1), unless the authorization is terminated or revoked.     Resp Syncytial Virus by PCR NEGATIVE NEGATIVE Final    Comment: (NOTE) Fact Sheet for Patients: BloggerCourse.com  Fact Sheet for Healthcare Providers: SeriousBroker.it  This test is not yet approved or cleared by the [place] FDA and has been authorized for detection and/or diagnosis of SARS-CoV-2 by FDA under an Emergency Use Authorization (EUA). This EUA will remain in effect (meaning this test can be used) for the duration of the COVID-19 declaration under Section 564(b)(1) of the Act, 21 U.S.C. section 360bbb-3(b)(1), unless the authorization is terminated or revoked.  Performed at [facility], [address], [place], [place] [address]   Respiratory (~20 pathogens) panel by PCR     Status: None   Collection Time: 02/12/23 12:07 AM   Specimen:  Nasopharyngeal Swab; Respiratory  Result Value Ref Range Status   Adenovirus NOT DETECTED NOT DETECTED Final   Coronavirus 229E NOT DETECTED NOT DETECTED Final    Comment: (NOTE) The Coronavirus on the Respiratory Panel, DOES NOT test for the novel  Coronavirus (2019 nCoV)    Coronavirus HKU1 NOT DETECTED NOT DETECTED Final   Coronavirus NL63 NOT DETECTED NOT DETECTED Final   Coronavirus OC43 NOT DETECTED NOT DETECTED Final   Metapneumovirus NOT DETECTED NOT DETECTED Final   Rhinovirus / Enterovirus NOT DETECTED NOT DETECTED Final   Influenza A NOT DETECTED NOT DETECTED Final   Influenza B NOT DETECTED NOT DETECTED Final   Parainfluenza Virus 1 NOT DETECTED NOT DETECTED Final   Parainfluenza Virus 2 NOT DETECTED NOT DETECTED Final   Parainfluenza Virus 3 NOT DETECTED NOT DETECTED Final   Parainfluenza Virus 4 NOT DETECTED NOT DETECTED Final   Respiratory Syncytial Virus NOT DETECTED NOT DETECTED Final   Bordetella pertussis NOT DETECTED NOT DETECTED Final   Bordetella Parapertussis NOT DETECTED NOT DETECTED Final   Chlamydophila pneumoniae NOT DETECTED NOT DETECTED Final   Mycoplasma pneumoniae NOT DETECTED NOT DETECTED Final    Comment: Performed at [facility], [address] [place], [place] [address]    Radiology Studies: DG CHEST PORT 1 VIEW Result Date: 02/14/2023 CLINICAL DATA:  [id number] SOB (shortness of breath) [id number] EXAM: PORTABLE CHEST 1 VIEW COMPARISON:  02/11/2023 FINDINGS: Stable cardiac silhouette. Stream U low lung volumes. Central venous congestion noted. There is upper lobe airspace disease on the RIGHT similar comparison exam. No interval change. IMPRESSION: 1. No significant change. 2. RIGHT upper lobe airspace pneumonia. 3. Very low lung volumes Electronically Signed   By: Genevive Bi M.D.   On: 02/14/2023 13:50   Scheduled Meds:  arformoterol  15 mcg Nebulization BID   azithromycin  500 mg Oral Daily   budesonide (PULMICORT) nebulizer solution   0.25 mg Nebulization BID   feeding supplement  237 mL Oral BID BM   furosemide  20 mg Oral Daily   gabapentin  300 mg Oral TID   guaiFENesin  1,200 mg Oral BID   insulin aspart  0-9 Units Subcutaneous  TID WC   methylPREDNISolone (SOLU-MEDROL) injection  60 mg Intravenous Q12H   multivitamin with minerals  1 tablet Oral Daily   pantoprazole  40 mg Oral Daily   phosphorus  500 mg Oral Once   Pirfenidone  534 mg Oral TID with meals   sodium chloride flush  3 mL Intravenous Q12H   spironolactone  50 mg Oral Daily   Continuous Infusions:  cefTRIAXone (ROCEPHIN)  IV     magnesium sulfate bolus IVPB      LOS: 2 days   Marguerita Merles, DO [facility] Available via Epic secure chat 7am-7pm After these hours, please refer to coverage provider listed on [email or web address] 02/14/2023, 6:54 PM

## 2023-02-14 NOTE — Progress Notes (Addendum)
SATURATION QUALIFICATIONS: (This note is used to comply with regulatory documentation for home oxygen)  Patient Saturations on Room Air at Rest = 79%  Patient Saturations on Room Air while standing = 82%  Patient Saturations on 13 Liters of oxygen while sitting after standing to recover= 90-92%  Please briefly explain why patient needs home oxygen:

## 2023-02-14 NOTE — Telephone Encounter (Signed)
Order for concentrator placed.

## 2023-02-15 ENCOUNTER — Inpatient Hospital Stay (HOSPITAL_COMMUNITY): Payer: Medicaid Other

## 2023-02-15 DIAGNOSIS — J9621 Acute and chronic respiratory failure with hypoxia: Secondary | ICD-10-CM | POA: Diagnosis not present

## 2023-02-15 DIAGNOSIS — K7460 Unspecified cirrhosis of liver: Secondary | ICD-10-CM | POA: Diagnosis not present

## 2023-02-15 DIAGNOSIS — D696 Thrombocytopenia, unspecified: Secondary | ICD-10-CM | POA: Diagnosis not present

## 2023-02-15 DIAGNOSIS — J849 Interstitial pulmonary disease, unspecified: Secondary | ICD-10-CM | POA: Diagnosis not present

## 2023-02-15 LAB — PROCALCITONIN: Procalcitonin: 0.52 ng/mL

## 2023-02-15 LAB — CBC WITH DIFFERENTIAL/PLATELET
Abs Immature Granulocytes: 0.07 10*3/uL (ref 0.00–0.07)
Basophils Absolute: 0 10*3/uL (ref 0.0–0.1)
Basophils Relative: 0 %
Eosinophils Absolute: 0 10*3/uL (ref 0.0–0.5)
Eosinophils Relative: 0 %
HCT: 25.4 % — ABNORMAL LOW (ref 39.0–52.0)
Hemoglobin: 8.2 g/dL — ABNORMAL LOW (ref 13.0–17.0)
Immature Granulocytes: 1 %
Lymphocytes Relative: 3 %
Lymphs Abs: 0.2 10*3/uL — ABNORMAL LOW (ref 0.7–4.0)
MCH: 36 pg — ABNORMAL HIGH (ref 26.0–34.0)
MCHC: 32.3 g/dL (ref 30.0–36.0)
MCV: 111.4 fL — ABNORMAL HIGH (ref 80.0–100.0)
Monocytes Absolute: 0.4 10*3/uL (ref 0.1–1.0)
Monocytes Relative: 5 %
Neutro Abs: 7.8 10*3/uL — ABNORMAL HIGH (ref 1.7–7.7)
Neutrophils Relative %: 91 %
Platelets: 111 10*3/uL — ABNORMAL LOW (ref 150–400)
RBC: 2.28 MIL/uL — ABNORMAL LOW (ref 4.22–5.81)
RDW: 15.6 % — ABNORMAL HIGH (ref 11.5–15.5)
Smear Review: NORMAL
WBC: 8.5 10*3/uL (ref 4.0–10.5)
nRBC: 0 % (ref 0.0–0.2)

## 2023-02-15 LAB — MAGNESIUM: Magnesium: 2.1 mg/dL (ref 1.7–2.4)

## 2023-02-15 LAB — COMPREHENSIVE METABOLIC PANEL
ALT: 56 U/L — ABNORMAL HIGH (ref 0–44)
AST: 97 U/L — ABNORMAL HIGH (ref 15–41)
Albumin: <1.5 g/dL — ABNORMAL LOW (ref 3.5–5.0)
Alkaline Phosphatase: 239 U/L — ABNORMAL HIGH (ref 38–126)
Anion gap: 4 — ABNORMAL LOW (ref 5–15)
BUN: 29 mg/dL — ABNORMAL HIGH (ref 6–20)
CO2: 25 mmol/L (ref 22–32)
Calcium: 7.7 mg/dL — ABNORMAL LOW (ref 8.9–10.3)
Chloride: 109 mmol/L (ref 98–111)
Creatinine, Ser: 0.93 mg/dL (ref 0.61–1.24)
GFR, Estimated: >60 mL/min (ref >60)
Glucose, Bld: 254 mg/dL — ABNORMAL HIGH (ref 70–99)
Potassium: 4.3 mmol/L (ref 3.5–5.1)
Sodium: 138 mmol/L (ref 135–145)
Total Bilirubin: 0.7 mg/dL (ref 0.0–1.2)
Total Protein: 6.9 g/dL (ref 6.5–8.1)

## 2023-02-15 LAB — GLUCOSE, CAPILLARY
Glucose-Capillary: 173 mg/dL — ABNORMAL HIGH (ref 70–99)
Glucose-Capillary: 173 mg/dL — ABNORMAL HIGH (ref 70–99)

## 2023-02-15 LAB — PHOSPHORUS: Phosphorus: 2.4 mg/dL — ABNORMAL LOW (ref 2.5–4.6)

## 2023-02-15 MED ORDER — GUAIFENESIN ER 600 MG PO TB12: 600.0000 mg | ORAL_TABLET | Freq: Two times a day (BID) | ORAL | 0 refills | Status: AC

## 2023-02-15 MED ORDER — ENSURE ENLIVE PO LIQD: 237.0000 mL | Freq: Two times a day (BID) | ORAL | 12 refills | Status: DC

## 2023-02-15 MED ORDER — AZITHROMYCIN 500 MG PO TABS: 500.0000 mg | ORAL_TABLET | Freq: Every day | ORAL | 0 refills | Status: AC

## 2023-02-15 MED ORDER — FUROSEMIDE 10 MG/ML IJ SOLN
40.0000 mg | Freq: Once | INTRAMUSCULAR | Status: AC
  Administered 2023-02-15: 40 mg via INTRAVENOUS
  Filled 2023-02-15: qty 4

## 2023-02-15 MED ORDER — PREDNISONE 10 MG PO TABS: ORAL_TABLET | ORAL | 0 refills | Status: DC

## 2023-02-15 MED ORDER — ONDANSETRON HCL 4 MG PO TABS: 4.0000 mg | ORAL_TABLET | Freq: Four times a day (QID) | ORAL | 0 refills | Status: DC | PRN

## 2023-02-15 MED ORDER — SENNOSIDES-DOCUSATE SODIUM 8.6-50 MG PO TABS: 1.0000 | ORAL_TABLET | Freq: Every evening | ORAL | 0 refills | Status: DC | PRN

## 2023-02-15 MED ORDER — CEFDINIR 300 MG PO CAPS: 300.0000 mg | ORAL_CAPSULE | Freq: Two times a day (BID) | ORAL | 0 refills | Status: AC

## 2023-02-15 MED ORDER — K PHOS MONO-SOD PHOS DI & MONO 155-852-130 MG PO TABS
500.0000 mg | ORAL_TABLET | Freq: Once | ORAL | Status: AC
  Administered 2023-02-15: 500 mg via ORAL
  Filled 2023-02-15: qty 2

## 2023-02-15 NOTE — Discharge Summary (Signed)
Physician Discharge Summary   Patient: Richard Davenport MRN: [id number] DOB: 1960/09/24  Admit date:     02/11/2023  Discharge date: 02/15/23  Discharge Physician: Marguerita Merles, DO   PCP: Kalman Shan, MD   Recommendations at discharge:  {Tip this will not be part of the note when signed- Example include specific recommendations for outpatient follow-up, pending tests to follow-up on. (Optional):26781}  ***  Discharge Diagnoses: Principal Problem:   Acute on chronic respiratory failure with hypoxia (HCC) Active Problems:   Thrombocytopenia (HCC)   Cirrhosis (HCC)   Lung disease, interstitial (HCC)   Type 2 diabetes mellitus (HCC)  Resolved Problems:   * No resolved hospital problems. [facility] Course: Richard Davenport is a 63 y.o. male with medical history significant for ILD/pulmonary fibrosis, chronic hypoxic respiratory failure on 5 L O2 [place], Hx of SAH s/p aneurysm coiling, hepatic cirrhosis, HLD, anemia of chronic disease, chronic thrombocytopenia who is admitted for acute on chronic hypoxic respiratory failure in setting of ILD/pulmonary fibrosis.  He was consulted and medication recommendations have been made.  Unfortunately he continues to require quite a bit of oxygen and will need to ensure that his oxygen concentrator goes up to at least 10 L at home.  Will need to see his progress and continue to monitor his respiratory status carefully  Assessment and Plan:  Acute on Chronic Hypoxemic Respiratory Failure secondary to pulmonary fibrosis exacerbation and ILD flare: -Extensively investigated and followed by [facility] and pulmonology at [facility].   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  Also has some evidence of cirrhosis. -Continue bronchodilator therapy, high-dose IV steroids sith Solumedrol 60 mg q12g, inhalational steroids with Pulmicort and continue with Arformoterol 15 mcg Neb BID -C/w Duoneb 3 mL Neb q6h -Do breathing exercises and incentive  spirometry.  Mucolytic's. -Antibiotics due to severity of symptoms, will continue azithromycin for 5 days and Add Ceftriaxone SpO2: 95 % O2 Flow Rate (L/min): 9 L/min FiO2 (%): 40 % -Supplemental oxygen to keep saturations more than 88%. -Due to advanced fibrosis and high short-term mortality, will need goal of care discussion.  Will consult palliative care team to initiate discussion.  Family agrees. -Will start mobilizing with PT OT and they are recommending Home Health PT/OT -From outpatient patient is on rituximab and pirfenidone. Will continue pirfenidone -RSV panel negative for COVID, influenza and RSV. -Repeat CXR done and showed "No significant change.Marland Kitchen RIGHT upper lobe airspace pneumonia. Very low lung volumes" -Will need to ensure that his home oxygen 100 goes up to 10 L and have TOC to help with this. -At discharge he was transitioned to 40 mg of p.o. prednisone and taper over 2 weeks down to baseline dose of 10 mg.  For now we will continue high-dose steroids   Hypophosphatemia -Phos Level Trend: Recent Labs  Lab 02/14/23 1039  PHOS 2.3*  -Replete with po K Phos Neutral 500 mg x1 -Repeat Phos in the AM  Hepatic cirrhosis with mild ascites Abnormal LFTs -Stable.  On Lasix 20 mg po Daily and Spironlactone 50 mg po daily which are being continued -AST/ALT and Bilirubin Trend: Recent Labs  Lab 02/11/23 1835 02/12/23 0030 02/14/23 1039  AST 67* 74* 69*  ALT 37 40 45*  BILITOT 1.4* 1.3* 0.7  -Continue to Monitor and Trend and repeat CMP in the AM  Macrocytic Anemia/Anemia of Chronic Disease -Hemoglobin is stable. -Hgb/Hct Trend: Recent Labs  Lab 02/02/23 1430 02/11/23 1835 02/12/23 0030 02/14/23 1039  HGB 9.2* 9.3*  8.5* 8.4*  HCT 29.4* 28.7* 27.2* 25.9*  MCV 118.1* 114.3* 115.7* 111.6*  -Check Anemia Panel in the AM -Continue to Monitor for S/Sx of Bleeding; No overt bleeding noted -Repeat CBC in the AM  Hypergammaglobulinemia -They were unable to send an  immunodeficiency panel in the hospital and will not reduce from office  Chronic thrombocytopenia due to liver disease, stable. 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 -CBG Trend: Recent Labs  Lab 02/13/23 0815 02/13/23 1225 02/13/23 1647 02/13/23 2107 02/14/23 0809 02/14/23 1215 02/14/23 1608  GLUCAP 142* 205* 122* 170* 159* 154* 242*   Hypoalbuminemia -Patient's Albumin Trend: Recent Labs  Lab 02/11/23 1835 02/12/23 0030 02/14/23 1039  ALBUMIN <1.5* <1.5* <1.5*  -Continue to Monitor and Trend and repeat CMP in the AM  GERD/GI Prophylaxis -C/w Pantoprazole 40 mg po Daily   Goal of care: As above.  -Patient will need goal of care discussions.  Palliative care consulted. I explained the severe nature of his lung disease.  Progressively worsening condition and advised to consider DO NOT INTUBATE his status because if he has to go on a ventilator, more than likely he will not be able to be liberated from the ventilator. Also discussed that in this stage of his lung disease, he may benefit with palliation or hospice at home.  They have agreed to meet with palliative care team.    Assessment and Plan: No notes have been filed under this hospital service. Service: Hospitalist     {Tip this will not be part of the note when signed Body mass index is 22.2 kg/m. , ,  (Optional):26781}  {(NOTE) Pain control PDMP Statment (Optional):26782} Consultants: *** Procedures performed: ***  Disposition: {Plan; Disposition:26390} Diet recommendation:  Discharge Diet Orders (From admission, onward)     Start     Ordered   02/15/23 0000  Diet - low sodium heart healthy        02/15/23 1240           {Diet_Plan:26776} DISCHARGE MEDICATION: Allergies as of  02/15/2023       Reactions   Pork-derived Products Other (See Comments)   Patient is Muslim and PREFERS TO NOT TAKE ANY PORK OR MEAT PRODUCTS (only fish)        Medication List     TAKE these medications    albuterol (2.5 MG/3ML) 0.083% nebulizer solution Commonly known as: PROVENTIL Take 3 mLs (2.5 mg total) by nebulization every 4 (four) hours as needed for wheezing or shortness of breath.   albuterol 108 (90 Base) MCG/ACT inhaler Commonly known as: VENTOLIN HFA Inhale 2 puffs into the lungs every 6 (six) hours as needed for wheezing or shortness of breath.   azithromycin 500 MG tablet Commonly known as: ZITHROMAX Take 1 tablet (500 mg total) by mouth daily for 3 days.   budesonide 0.5 MG/2ML nebulizer solution Commonly known as: Pulmicort Take 2 mLs (0.5 mg total) by nebulization in the morning, at noon, in the evening, and at bedtime.   cefdinir 300 MG capsule Commonly known as: OMNICEF Take 1 capsule (300 mg total) by mouth 2 (two) times daily for 4 days.   feeding supplement Liqd Take 237 mLs by mouth 2 (two) times daily between meals.   furosemide 40 MG  tablet Commonly known as: LASIX Take 0.5 tablets (20 mg total) by mouth daily. What changed:  when to take this reasons to take this   gabapentin 300 MG capsule Commonly known as: NEURONTIN Take 300 mg by mouth 3 (three) times daily.   guaiFENesin 600 MG 12 hr tablet Commonly known as: MUCINEX Take 1 tablet (600 mg total) by mouth 2 (two) times daily for 5 days.   guaiFENesin-dextromethorphan 100-10 MG/5ML syrup Commonly known as: ROBITUSSIN DM Take 15 mLs by mouth every 8 (eight) hours as needed for cough.   metFORMIN 500 MG tablet Commonly known as: GLUCOPHAGE Take 1 tablet (500 mg total) by mouth 2 (two) times daily. What changed: when to take this   mometasone 0.1 % ointment Commonly known as: ELOCON Apply topically daily. 1 application to left ear once daily.   multivitamin with minerals Tabs  tablet Take 1 tablet by mouth daily.   omeprazole 20 MG capsule Commonly known as: PRILOSEC Take 20 mg by mouth daily as needed (for heartburn).   ondansetron 4 MG tablet Commonly known as: ZOFRAN Take 1 tablet (4 mg total) by mouth every 6 (six) hours as needed for nausea.   Pirfenidone 267 MG Tabs Take 3 tablets (801 mg total) by mouth with breakfast, with lunch, and with evening meal. Month 2 and onwards What changed:  how much to take additional instructions   predniSONE 10 MG tablet Commonly known as: DELTASONE Take 4 tablets (40 mg total) by mouth daily with breakfast for 5 days, THEN 3 tablets (30 mg total) daily with breakfast for 5 days, THEN 2 tablets (20 mg total) daily with breakfast for 5 days, THEN 1 tablet (10 mg total) daily with breakfast. Start taking on: February 15, 2023 What changed: See the new instructions.   riTUXimab 500 MG/50ML injection Commonly known as: RITUXAN Inject 1,000 mg into the vein every 6 (six) months.   senna-docusate 8.6-50 MG tablet Commonly known as: Senokot-S Take 1 tablet by mouth at bedtime as needed for mild constipation.   spironolactone 50 MG tablet Commonly known as: ALDACTONE Take 50 mg by mouth daily.        Follow-up Information     Care, [facility] Follow up.   Specialty: Home Health Services Why: Physical Therapy-office to call with visit times. Contact information: [address] [place] [address] [phone]                Discharge Exam: Filed Weights   02/13/23 0351 02/14/23 0536 02/15/23 0531  Weight: 65.9 kg 66.5 kg 66.2 kg   ***  Condition at discharge: {DC Condition:26389}  The results of significant diagnostics from this hospitalization (including imaging, microbiology, ancillary and laboratory) are listed below for reference.   Imaging Studies: DG CHEST PORT 1 VIEW Result Date: 02/15/2023 CLINICAL DATA:  Shortness of breath. EXAM: PORTABLE CHEST 1 VIEW  COMPARISON:  02/14/2023 FINDINGS: Markedly low volume film. The cardio pericardial silhouette is enlarged. Diffuse bilateral interstitial and airspace disease suggests edema or asymmetric infection. Small right pleural effusion. Bones are diffusely demineralized. Telemetry leads overlie the chest. IMPRESSION: Markedly low volume film with diffuse bilateral interstitial and airspace disease suggesting edema or asymmetric infection. No substantial change. Electronically Signed   By: Kennith Center M.D.   On: 02/15/2023 11:24   DG CHEST PORT 1 VIEW Result Date: 02/14/2023 CLINICAL DATA:  [id number] SOB (shortness of breath) [id number] EXAM: PORTABLE CHEST 1 VIEW COMPARISON:  02/11/2023 FINDINGS: Stable cardiac silhouette.  Stream U low lung volumes. Central venous congestion noted. There is upper lobe airspace disease on the RIGHT similar comparison exam. No interval change. IMPRESSION: 1. No significant change. 2. RIGHT upper lobe airspace pneumonia. 3. Very low lung volumes Electronically Signed   By: Genevive Bi M.D.   On: 02/14/2023 13:50   CT ANGIO CHEST PE W OR WO CONTRAST Result Date: 02/11/2023 CLINICAL DATA:  Pulmonary embolism (PE) suspected, high prob. Shortness of breath. EXAM: CT ANGIOGRAPHY CHEST WITH CONTRAST TECHNIQUE: Multidetector CT imaging of the chest was performed using the standard protocol during bolus administration of intravenous contrast. Multiplanar CT image reconstructions and MIPs were obtained to evaluate the vascular anatomy. RADIATION DOSE REDUCTION: This exam was performed according to the departmental dose-optimization program which includes automated exposure control, adjustment of the mA and/or kV according to patient size and/or use of iterative reconstruction technique. CONTRAST:  75mL OMNIPAQUE IOHEXOL 350 MG/ML SOLN COMPARISON:  05/19/2022 FINDINGS: Cardiovascular: No filling defects in the pulmonary arteries to suggest pulmonary emboli. Heart borderline in size. Aorta  normal caliber. Mediastinum/Nodes: No mediastinal, hilar, or axillary adenopathy. Trachea and esophagus are unremarkable. Thyroid unremarkable. Lungs/Pleura: Changes of fibrosis throughout the lungs, similar to prior study. No definite acute process. No effusions. Upper Abdomen: Cirrhosis.  Large volume upper abdominal ascites. Musculoskeletal: Bilateral gynecomastia.  No acute bony abnormality. Review of the MIP images confirms the above findings. IMPRESSION: No evidence of pulmonary embolus. Very low lung volumes with changes of severe fibrosis. No definite acute process. Borderline cardiomegaly. Cirrhosis, large volume upper abdominal ascites. Electronically Signed   By: Charlett Nose M.D.   On: 02/11/2023 22:06   DG Chest Port 1 View Result Date: 02/11/2023 CLINICAL DATA:  Shortness of breath EXAM: PORTABLE CHEST 1 VIEW COMPARISON:  02/02/2023 FINDINGS: Heart and mediastinal contours are stable. Very low lung volumes with chronic fibrotic changes throughout the lungs. No definite acute process or effusions. No acute bony abnormality. IMPRESSION: Very low lung volumes with stable chronic fibrosis. No definite acute process. Electronically Signed   By: Charlett Nose M.D.   On: 02/11/2023 18:50   DG Chest Port 1 View Result Date: 02/02/2023 CLINICAL DATA:  Cough.  Pulmonary fibrosis. EXAM: PORTABLE CHEST 1 VIEW COMPARISON:  12/19/2022 and 05/19/2022 FINDINGS: The heart size and mediastinal contours are within normal limits. Stable low lung volumes and chronic interstitial lung disease. No acute or superimposed pulmonary opacity. No pleural effusion. IMPRESSION: Stable chronic interstitial lung disease. No acute findings. Electronically Signed   By: Danae Orleans M.D.   On: 02/02/2023 15:39    Microbiology: Results for orders placed or performed during the hospital encounter of 02/11/23  Resp panel by RT-PCR (RSV, Flu A&B, Covid) Anterior Nasal Swab     Status: None   Collection Time: 02/11/23  6:29 PM    Specimen: Anterior Nasal Swab  Result Value Ref Range Status   SARS Coronavirus 2 by RT PCR NEGATIVE NEGATIVE Final   Influenza A by PCR NEGATIVE NEGATIVE Final   Influenza B by PCR NEGATIVE NEGATIVE Final    Comment: (NOTE) The Xpert Xpress SARS-CoV-2/FLU/RSV plus assay is intended as an aid in the diagnosis of influenza from Nasopharyngeal swab specimens and should not be used as a sole basis for treatment. Nasal washings and aspirates are unacceptable for Xpert Xpress SARS-CoV-2/FLU/RSV testing.  Fact Sheet for Patients: BloggerCourse.com  Fact Sheet for Healthcare Providers: SeriousBroker.it  This test is not yet approved or cleared by the [place] FDA and has been  authorized for detection and/or diagnosis of SARS-CoV-2 by FDA under an Emergency Use Authorization (EUA). This EUA will remain in effect (meaning this test can be used) for the duration of the COVID-19 declaration under Section 564(b)(1) of the Act, 21 U.S.C. section 360bbb-3(b)(1), unless the authorization is terminated or revoked.     Resp Syncytial Virus by PCR NEGATIVE NEGATIVE Final    Comment: (NOTE) Fact Sheet for Patients: BloggerCourse.com  Fact Sheet for Healthcare Providers: SeriousBroker.it  This test is not yet approved or cleared by the [place] FDA and has been authorized for detection and/or diagnosis of SARS-CoV-2 by FDA under an Emergency Use Authorization (EUA). This EUA will remain in effect (meaning this test can be used) for the duration of the COVID-19 declaration under Section 564(b)(1) of the Act, 21 U.S.C. section 360bbb-3(b)(1), unless the authorization is terminated or revoked.  Performed at [facility], [address], [place], [place] [address]   Respiratory (~20 pathogens) panel by PCR     Status: None   Collection Time: 02/12/23 12:07 AM   Specimen:  Nasopharyngeal Swab; Respiratory  Result Value Ref Range Status   Adenovirus NOT DETECTED NOT DETECTED Final   Coronavirus 229E NOT DETECTED NOT DETECTED Final    Comment: (NOTE) The Coronavirus on the Respiratory Panel, DOES NOT test for the novel  Coronavirus (2019 nCoV)    Coronavirus HKU1 NOT DETECTED NOT DETECTED Final   Coronavirus NL63 NOT DETECTED NOT DETECTED Final   Coronavirus OC43 NOT DETECTED NOT DETECTED Final   Metapneumovirus NOT DETECTED NOT DETECTED Final   Rhinovirus / Enterovirus NOT DETECTED NOT DETECTED Final   Influenza A NOT DETECTED NOT DETECTED Final   Influenza B NOT DETECTED NOT DETECTED Final   Parainfluenza Virus 1 NOT DETECTED NOT DETECTED Final   Parainfluenza Virus 2 NOT DETECTED NOT DETECTED Final   Parainfluenza Virus 3 NOT DETECTED NOT DETECTED Final   Parainfluenza Virus 4 NOT DETECTED NOT DETECTED Final   Respiratory Syncytial Virus NOT DETECTED NOT DETECTED Final   Bordetella pertussis NOT DETECTED NOT DETECTED Final   Bordetella Parapertussis NOT DETECTED NOT DETECTED Final   Chlamydophila pneumoniae NOT DETECTED NOT DETECTED Final   Mycoplasma pneumoniae NOT DETECTED NOT DETECTED Final    Comment: Performed at [facility], [address], [place], [place] [address]    Labs: CBC: Recent Labs  Lab 02/11/23 1835 02/12/23 0030 02/14/23 1039 02/15/23 0400  WBC 9.7 6.3 10.1 8.5  NEUTROABS  --   --  10.1* 7.8*  HGB 9.3* 8.5* 8.4* 8.2*  HCT 28.7* 27.2* 25.9* 25.4*  MCV 114.3* 115.7* 111.6* 111.4*  PLT 128* 109* 114* 111*   Basic Metabolic Panel: Recent Labs  Lab 02/11/23 1835 02/12/23 0030 02/14/23 1039 02/15/23 0400  NA 140 139 138 138  K 3.5 4.1 3.7 4.3  CL 108 108 107 109  CO2 22 17* 24 25  GLUCOSE 129* 244* 169* 254*  BUN 28* 32* 34* 29*  CREATININE 0.93 1.18 0.94 0.93  CALCIUM 8.3* 8.7* 8.3* 7.7*  MG  --   --  1.8 2.1  PHOS  --   --  2.3* 2.4*   Liver Function Tests: Recent Labs  Lab 02/11/23 1835  02/12/23 0030 02/14/23 1039 02/15/23 0400  AST 67* 74* 69* 97*  ALT 37 40 45* 56*  ALKPHOS 245* 242* 203* 239*  BILITOT 1.4* 1.3* 0.7 0.7  PROT 7.8 7.8 7.4 6.9  ALBUMIN <1.5* <1.5* <1.5* <1.5*   CBG:  Recent Labs  Lab 02/14/23 1215 02/14/23 1608 02/14/23 2102 02/15/23 0808 02/15/23 1207  GLUCAP 154* 242* 136* 173* 173*    Discharge time spent: {LESS THAN/GREATER THAN:26388} 30 minutes.  Signed: Merlene Laughter, DO [facility] 02/15/2023

## 2023-02-15 NOTE — TOC Transition Note (Signed)
Transition of Care [facility]) - Discharge Note   Patient Details  Name: Richard Davenport MRN: [id number] Date of Birth: November 05, 1960  Transition of Care [facility]) CM/SW Contact:  Ronny Bacon, RN Phone Number: 02/15/2023, 2:01 PM   Clinical Narrative:   Patient is being discharged today. Cory with [facility] aware. Morrie Sheldon with [employer] aware, 10 L oxygen concentrator arranged yesterday.    Final next level of care: Home w Home Health Services Barriers to Discharge: No Barriers Identified   Patient Goals and CMS Choice Patient states their goals for this hospitalization and ongoing recovery are:: plan for home once stable.          Discharge Placement                       Discharge Plan and Services Additional resources added to the After Visit Summary for     Discharge Planning Services: CM Consult Post Acute Care Choice: Home Health, Durable Medical Equipment          DME Arranged: Oxygen DME Agency: Patsy Lager Date DME Agency Contacted: 02/14/23 Time DME Agency Contacted: 1211 Representative spoke with at DME Agency: Morrie Sheldon HH Arranged: PT HH Agency: [facility] Date [facility] Agency Contacted: 02/14/23 Time HH Agency Contacted: 1212 Representative spoke with at [facility] Agency: Cindie  Social Drivers of Health (SDOH) Interventions SDOH Screenings   Food Insecurity: No Food Insecurity (02/12/2023)  Housing: Low Risk  (02/12/2023)  Transportation Needs: No Transportation Needs (02/12/2023)  Utilities: Not At Risk (02/12/2023)  Depression (PHQ2-9): Low Risk  (01/01/2023)  Financial Resource Strain: Low Risk  (05/24/2022)   Received from [facility]  Physical Activity: Not on File (05/10/2021)   Received from [place], [place]  Social Connections: Not on File (10/05/2022)   Received from [facility]  Stress: Not on File (05/10/2021)   Received from [place], [place]  Tobacco Use: Medium Risk (02/11/2023)     Readmission Risk  Interventions     No data to display

## 2023-02-18 ENCOUNTER — Telehealth: Payer: Self-pay | Admitting: Pulmonary Disease

## 2023-02-18 DIAGNOSIS — I10 Essential (primary) hypertension: Secondary | ICD-10-CM | POA: Diagnosis present

## 2023-02-18 NOTE — Telephone Encounter (Signed)
[place], Tereasa Coop, [place]; [place], [place]; Mechele Dawley, [place], [place] We exchanged his equipment out on Friday. We do still need an order to be written with the exact liter flow.

## 2023-02-20 LAB — PROTEIN ELECTROPHORESIS, SERUM
A/G Ratio: 0.3 — ABNORMAL LOW (ref 0.7–1.7)
Albumin ELP: 1.6 g/dL — ABNORMAL LOW (ref 2.9–4.4)
Alpha-1-Globulin: 0.4 g/dL (ref 0.0–0.4)
Alpha-2-Globulin: 0.8 g/dL (ref 0.4–1.0)
Beta Globulin: 0.9 g/dL (ref 0.7–1.3)
Gamma Globulin: 3.6 g/dL — ABNORMAL HIGH (ref 0.4–1.8)
Globulin, Total: 5.8 g/dL — ABNORMAL HIGH (ref 2.2–3.9)
Total Protein ELP: 7.4 g/dL (ref 6.0–8.5)

## 2023-02-25 ENCOUNTER — Telehealth: Payer: Self-pay | Admitting: Internal Medicine

## 2023-02-25 NOTE — Telephone Encounter (Signed)
 Physical therapist did an assessment today and it will be twice a week for one week and then once a week for three weeks. Oxygen level was around 84% . Patient uses Symbicort  and albuterol  and laid back in bed. His oxygen then went up to 90%. Patient is currently on 8 liters of oxygen.

## 2023-03-04 NOTE — Telephone Encounter (Signed)
Ok to call in. Please ensure followup

## 2023-03-05 ENCOUNTER — Emergency Department (HOSPITAL_COMMUNITY): Payer: Medicaid Other

## 2023-03-05 ENCOUNTER — Inpatient Hospital Stay (HOSPITAL_COMMUNITY)
Admission: EM | Admit: 2023-03-05 | Discharge: 2023-03-10 | DRG: 196 | Disposition: A | Discharge status: Home / Self Care | Payer: Medicaid Other | Attending: Internal Medicine | Admitting: Internal Medicine

## 2023-03-05 ENCOUNTER — Other Ambulatory Visit: Payer: Self-pay

## 2023-03-05 ENCOUNTER — Encounter (HOSPITAL_COMMUNITY): Payer: Self-pay | Admitting: Emergency Medicine

## 2023-03-05 ENCOUNTER — Ambulatory Visit: Payer: Medicaid Other | Admitting: Internal Medicine

## 2023-03-05 ENCOUNTER — Inpatient Hospital Stay (HOSPITAL_COMMUNITY): Payer: Medicaid Other

## 2023-03-05 ENCOUNTER — Encounter: Payer: Self-pay | Admitting: Internal Medicine

## 2023-03-05 VITALS — BP 126/86 | HR 122 | Ht 68.0 in | Wt 160.0 lb

## 2023-03-05 DIAGNOSIS — I119 Hypertensive heart disease without heart failure: Secondary | ICD-10-CM | POA: Diagnosis present

## 2023-03-05 DIAGNOSIS — D892 Hypergammaglobulinemia, unspecified: Secondary | ICD-10-CM | POA: Diagnosis present

## 2023-03-05 DIAGNOSIS — J8410 Pulmonary fibrosis, unspecified: Secondary | ICD-10-CM | POA: Diagnosis present

## 2023-03-05 DIAGNOSIS — R7989 Other specified abnormal findings of blood chemistry: Secondary | ICD-10-CM | POA: Diagnosis present

## 2023-03-05 DIAGNOSIS — Z09 Encounter for follow-up examination after completed treatment for conditions other than malignant neoplasm: Secondary | ICD-10-CM

## 2023-03-05 DIAGNOSIS — K7460 Unspecified cirrhosis of liver: Secondary | ICD-10-CM | POA: Diagnosis present

## 2023-03-05 DIAGNOSIS — Z8249 Family history of ischemic heart disease and other diseases of the circulatory system: Secondary | ICD-10-CM

## 2023-03-05 DIAGNOSIS — Z7951 Long term (current) use of inhaled steroids: Secondary | ICD-10-CM

## 2023-03-05 DIAGNOSIS — D84821 Immunodeficiency due to drugs: Secondary | ICD-10-CM | POA: Diagnosis present

## 2023-03-05 DIAGNOSIS — R768 Other specified abnormal immunological findings in serum: Secondary | ICD-10-CM

## 2023-03-05 DIAGNOSIS — Z7902 Long term (current) use of antithrombotics/antiplatelets: Secondary | ICD-10-CM

## 2023-03-05 DIAGNOSIS — Z7189 Other specified counseling: Secondary | ICD-10-CM | POA: Diagnosis not present

## 2023-03-05 DIAGNOSIS — Z515 Encounter for palliative care: Secondary | ICD-10-CM

## 2023-03-05 DIAGNOSIS — Z7984 Long term (current) use of oral hypoglycemic drugs: Secondary | ICD-10-CM | POA: Diagnosis not present

## 2023-03-05 DIAGNOSIS — Z8679 Personal history of other diseases of the circulatory system: Secondary | ICD-10-CM | POA: Diagnosis not present

## 2023-03-05 DIAGNOSIS — J849 Interstitial pulmonary disease, unspecified: Secondary | ICD-10-CM | POA: Diagnosis not present

## 2023-03-05 DIAGNOSIS — I671 Cerebral aneurysm, nonruptured: Secondary | ICD-10-CM | POA: Diagnosis present

## 2023-03-05 DIAGNOSIS — Z79899 Other long term (current) drug therapy: Secondary | ICD-10-CM | POA: Diagnosis not present

## 2023-03-05 DIAGNOSIS — L989 Disorder of the skin and subcutaneous tissue, unspecified: Secondary | ICD-10-CM | POA: Diagnosis present

## 2023-03-05 DIAGNOSIS — J9621 Acute and chronic respiratory failure with hypoxia: Secondary | ICD-10-CM

## 2023-03-05 DIAGNOSIS — Z7960 Long term (current) use of unspecified immunomodulators and immunosuppressants: Secondary | ICD-10-CM

## 2023-03-05 DIAGNOSIS — D6959 Other secondary thrombocytopenia: Secondary | ICD-10-CM | POA: Diagnosis present

## 2023-03-05 DIAGNOSIS — Z66 Do not resuscitate: Secondary | ICD-10-CM | POA: Diagnosis present

## 2023-03-05 DIAGNOSIS — I10 Essential (primary) hypertension: Secondary | ICD-10-CM | POA: Diagnosis present

## 2023-03-05 DIAGNOSIS — J84112 Idiopathic pulmonary fibrosis: Secondary | ICD-10-CM

## 2023-03-05 DIAGNOSIS — E785 Hyperlipidemia, unspecified: Secondary | ICD-10-CM | POA: Diagnosis present

## 2023-03-05 DIAGNOSIS — Z8673 Personal history of transient ischemic attack (TIA), and cerebral infarction without residual deficits: Secondary | ICD-10-CM

## 2023-03-05 DIAGNOSIS — D638 Anemia in other chronic diseases classified elsewhere: Secondary | ICD-10-CM | POA: Diagnosis present

## 2023-03-05 DIAGNOSIS — E8770 Fluid overload, unspecified: Secondary | ICD-10-CM | POA: Diagnosis present

## 2023-03-05 DIAGNOSIS — R748 Abnormal levels of other serum enzymes: Secondary | ICD-10-CM | POA: Diagnosis present

## 2023-03-05 DIAGNOSIS — Z9981 Dependence on supplemental oxygen: Secondary | ICD-10-CM

## 2023-03-05 DIAGNOSIS — Z9889 Other specified postprocedural states: Secondary | ICD-10-CM | POA: Diagnosis not present

## 2023-03-05 DIAGNOSIS — R64 Cachexia: Secondary | ICD-10-CM | POA: Diagnosis present

## 2023-03-05 DIAGNOSIS — T380X5A Adverse effect of glucocorticoids and synthetic analogues, initial encounter: Secondary | ICD-10-CM | POA: Diagnosis present

## 2023-03-05 DIAGNOSIS — E1165 Type 2 diabetes mellitus with hyperglycemia: Secondary | ICD-10-CM | POA: Diagnosis present

## 2023-03-05 DIAGNOSIS — R140 Abdominal distension (gaseous): Secondary | ICD-10-CM | POA: Diagnosis present

## 2023-03-05 DIAGNOSIS — R188 Other ascites: Secondary | ICD-10-CM | POA: Diagnosis present

## 2023-03-05 DIAGNOSIS — K7469 Other cirrhosis of liver: Secondary | ICD-10-CM | POA: Diagnosis not present

## 2023-03-05 LAB — COMPREHENSIVE METABOLIC PANEL
ALT: 81 U/L — ABNORMAL HIGH (ref 0–44)
AST: 91 U/L — ABNORMAL HIGH (ref 15–41)
Albumin: <1.5 g/dL — ABNORMAL LOW (ref 3.5–5.0)
Alkaline Phosphatase: 477 U/L — ABNORMAL HIGH (ref 38–126)
Anion gap: 6 (ref 5–15)
BUN: 24 mg/dL — ABNORMAL HIGH (ref 6–20)
CO2: 30 mmol/L (ref 22–32)
Calcium: 8 mg/dL — ABNORMAL LOW (ref 8.9–10.3)
Chloride: 102 mmol/L (ref 98–111)
Creatinine, Ser: 0.74 mg/dL (ref 0.61–1.24)
GFR, Estimated: >60 mL/min (ref >60)
Glucose, Bld: 140 mg/dL — ABNORMAL HIGH (ref 70–99)
Potassium: 4.2 mmol/L (ref 3.5–5.1)
Sodium: 138 mmol/L (ref 135–145)
Total Bilirubin: 1.7 mg/dL — ABNORMAL HIGH (ref 0.0–1.2)
Total Protein: 8.1 g/dL (ref 6.5–8.1)

## 2023-03-05 LAB — I-STAT VENOUS BLOOD GAS, ED
Acid-Base Excess: 10 mmol/L — ABNORMAL HIGH (ref 0.0–2.0)
Bicarbonate: 35.2 mmol/L — ABNORMAL HIGH (ref 20.0–28.0)
Calcium, Ion: 1.15 mmol/L (ref 1.15–1.40)
HCT: 32 % — ABNORMAL LOW (ref 39.0–52.0)
Hemoglobin: 10.9 g/dL — ABNORMAL LOW (ref 13.0–17.0)
O2 Saturation: 70 %
Potassium: 4.3 mmol/L (ref 3.5–5.1)
Sodium: 144 mmol/L (ref 135–145)
TCO2: 37 mmol/L — ABNORMAL HIGH (ref 22–32)
pCO2, Ven: 51.6 mm[Hg] (ref 44–60)
pH, Ven: 7.442 — ABNORMAL HIGH (ref 7.25–7.43)
pO2, Ven: 36 mm[Hg] (ref 32–45)

## 2023-03-05 LAB — STREP PNEUMONIAE URINARY ANTIGEN: Strep Pneumo Urinary Antigen: NEGATIVE

## 2023-03-05 LAB — CBG MONITORING, ED
Glucose-Capillary: 172 mg/dL — ABNORMAL HIGH (ref 70–99)
Glucose-Capillary: 287 mg/dL — ABNORMAL HIGH (ref 70–99)

## 2023-03-05 LAB — CBC WITH DIFFERENTIAL/PLATELET
Abs Immature Granulocytes: 0 10*3/uL (ref 0.00–0.07)
Basophils Absolute: 0.1 10*3/uL (ref 0.0–0.1)
Basophils Relative: 1 %
Eosinophils Absolute: 0.1 10*3/uL (ref 0.0–0.5)
Eosinophils Relative: 2 %
HCT: 31.4 % — ABNORMAL LOW (ref 39.0–52.0)
Hemoglobin: 9.8 g/dL — ABNORMAL LOW (ref 13.0–17.0)
Lymphocytes Relative: 0 %
Lymphs Abs: 0 10*3/uL — ABNORMAL LOW (ref 0.7–4.0)
MCH: 36.4 pg — ABNORMAL HIGH (ref 26.0–34.0)
MCHC: 31.2 g/dL (ref 30.0–36.0)
MCV: 116.7 fL — ABNORMAL HIGH (ref 80.0–100.0)
Monocytes Absolute: 0.3 10*3/uL (ref 0.1–1.0)
Monocytes Relative: 4 %
Neutro Abs: 6.9 10*3/uL (ref 1.7–7.7)
Neutrophils Relative %: 93 %
Platelets: 115 10*3/uL — ABNORMAL LOW (ref 150–400)
RBC: 2.69 MIL/uL — ABNORMAL LOW (ref 4.22–5.81)
RDW: 18.2 % — ABNORMAL HIGH (ref 11.5–15.5)
WBC: 7.4 10*3/uL (ref 4.0–10.5)
nRBC: 0 % (ref 0.0–0.2)
nRBC: 0 /100{WBCs}

## 2023-03-05 LAB — RESP PANEL BY RT-PCR (RSV, FLU A&B, COVID)  RVPGX2
Influenza A by PCR: NEGATIVE
Influenza B by PCR: NEGATIVE
Resp Syncytial Virus by PCR: NEGATIVE
SARS Coronavirus 2 by RT PCR: NEGATIVE

## 2023-03-05 LAB — PROCALCITONIN: Procalcitonin: 0.35 ng/mL

## 2023-03-05 MED ORDER — IOHEXOL 350 MG/ML SOLN
75.0000 mL | Freq: Once | INTRAVENOUS | Status: AC | PRN
  Administered 2023-03-05: 75 mL via INTRAVENOUS

## 2023-03-05 MED ORDER — INSULIN ASPART 100 UNIT/ML IJ SOLN
0.0000 [IU] | INTRAMUSCULAR | Status: DC
  Administered 2023-03-05: 4 [IU] via SUBCUTANEOUS
  Administered 2023-03-06: 15 [IU] via SUBCUTANEOUS
  Administered 2023-03-06: 7 [IU] via SUBCUTANEOUS
  Administered 2023-03-06: 15 [IU] via SUBCUTANEOUS
  Administered 2023-03-06 – 2023-03-07 (×3): 7 [IU] via SUBCUTANEOUS
  Administered 2023-03-07: 4 [IU] via SUBCUTANEOUS
  Administered 2023-03-07: 7 [IU] via SUBCUTANEOUS
  Administered 2023-03-07: 4 [IU] via SUBCUTANEOUS
  Administered 2023-03-08: 7 [IU] via SUBCUTANEOUS
  Administered 2023-03-08: 3 [IU] via SUBCUTANEOUS
  Administered 2023-03-08 (×2): 4 [IU] via SUBCUTANEOUS
  Administered 2023-03-08 – 2023-03-09 (×2): 3 [IU] via SUBCUTANEOUS
  Administered 2023-03-09: 4 [IU] via SUBCUTANEOUS
  Administered 2023-03-09: 3 [IU] via SUBCUTANEOUS
  Administered 2023-03-09: 4 [IU] via SUBCUTANEOUS
  Administered 2023-03-09: 7 [IU] via SUBCUTANEOUS
  Administered 2023-03-09: 3 [IU] via SUBCUTANEOUS
  Administered 2023-03-10: 11 [IU] via SUBCUTANEOUS
  Administered 2023-03-10: 3 [IU] via SUBCUTANEOUS

## 2023-03-05 MED ORDER — INSULIN ASPART 100 UNIT/ML IJ SOLN: 0.0000 [IU] | Freq: Three times a day (TID) | INTRAMUSCULAR | Status: DC

## 2023-03-05 MED ORDER — METHYLPREDNISOLONE SODIUM SUCC 125 MG IJ SOLR: 125.0000 mg | Freq: Once | INTRAMUSCULAR | Status: DC

## 2023-03-05 MED ORDER — FUROSEMIDE 10 MG/ML IJ SOLN
40.0000 mg | Freq: Once | INTRAMUSCULAR | Status: AC
  Administered 2023-03-05: 40 mg via INTRAVENOUS
  Filled 2023-03-05: qty 4

## 2023-03-05 MED ORDER — GABAPENTIN 300 MG PO CAPS
300.0000 mg | ORAL_CAPSULE | Freq: Three times a day (TID) | ORAL | Status: DC
  Administered 2023-03-05 – 2023-03-10 (×15): 300 mg via ORAL
  Filled 2023-03-05 (×9): qty 1
  Filled 2023-03-05: qty 3
  Filled 2023-03-05 (×6): qty 1

## 2023-03-05 MED ORDER — VANCOMYCIN HCL 1500 MG/300ML IV SOLN
1500.0000 mg | Freq: Once | INTRAVENOUS | Status: AC
  Administered 2023-03-05: 1500 mg via INTRAVENOUS
  Filled 2023-03-05: qty 300

## 2023-03-05 MED ORDER — PIRFENIDONE 267 MG PO TABS: 534.0000 mg | ORAL_TABLET | Freq: Three times a day (TID) | ORAL | Status: DC

## 2023-03-05 MED ORDER — VANCOMYCIN HCL IN DEXTROSE 1-5 GM/200ML-% IV SOLN
1000.0000 mg | Freq: Two times a day (BID) | INTRAVENOUS | Status: DC
  Administered 2023-03-06 – 2023-03-08 (×5): 1000 mg via INTRAVENOUS
  Filled 2023-03-05 (×5): qty 200

## 2023-03-05 MED ORDER — ENSURE ENLIVE PO LIQD: 237.0000 mL | Freq: Two times a day (BID) | ORAL | Status: DC

## 2023-03-05 MED ORDER — ACETAMINOPHEN 325 MG PO TABS
650.0000 mg | ORAL_TABLET | Freq: Four times a day (QID) | ORAL | Status: DC | PRN
  Administered 2023-03-05 – 2023-03-08 (×2): 650 mg via ORAL
  Filled 2023-03-05 (×2): qty 2

## 2023-03-05 MED ORDER — SODIUM CHLORIDE 0.9% FLUSH
3.0000 mL | Freq: Two times a day (BID) | INTRAVENOUS | Status: DC
  Administered 2023-03-05 – 2023-03-10 (×9): 3 mL via INTRAVENOUS

## 2023-03-05 MED ORDER — ACETAMINOPHEN 650 MG RE SUPP: 650.0000 mg | Freq: Four times a day (QID) | RECTAL | Status: DC | PRN

## 2023-03-05 MED ORDER — ALBUTEROL SULFATE HFA 108 (90 BASE) MCG/ACT IN AERS: 2.0000 | INHALATION_SPRAY | RESPIRATORY_TRACT | Status: DC | PRN

## 2023-03-05 MED ORDER — INSULIN ASPART 100 UNIT/ML IJ SOLN: 0.0000 [IU] | INTRAMUSCULAR | Status: DC

## 2023-03-05 MED ORDER — SODIUM CHLORIDE 0.9 % IV SOLN: 500.0000 mg | Freq: Two times a day (BID) | INTRAVENOUS | Status: DC

## 2023-03-05 MED ORDER — BUDESONIDE 0.5 MG/2ML IN SUSP
0.5000 mg | Freq: Four times a day (QID) | RESPIRATORY_TRACT | Status: DC
  Administered 2023-03-05 – 2023-03-06 (×2): 0.5 mg via RESPIRATORY_TRACT
  Filled 2023-03-05 (×2): qty 2

## 2023-03-05 MED ORDER — ALBUTEROL SULFATE (2.5 MG/3ML) 0.083% IN NEBU
2.5000 mg | INHALATION_SOLUTION | RESPIRATORY_TRACT | Status: DC | PRN
  Administered 2023-03-05 – 2023-03-09 (×3): 2.5 mg via RESPIRATORY_TRACT
  Filled 2023-03-05 (×2): qty 3

## 2023-03-05 MED ORDER — POLYETHYLENE GLYCOL 3350 17 G PO PACK: 17.0000 g | PACK | Freq: Every day | ORAL | Status: DC | PRN

## 2023-03-05 MED ORDER — SODIUM CHLORIDE 0.9 % IV SOLN
2.0000 g | Freq: Three times a day (TID) | INTRAVENOUS | Status: AC
  Administered 2023-03-05 – 2023-03-09 (×13): 2 g via INTRAVENOUS
  Filled 2023-03-05 (×13): qty 12.5

## 2023-03-05 MED ORDER — METHYLPREDNISOLONE SODIUM SUCC 40 MG IJ SOLR: 40.0000 mg | Freq: Two times a day (BID) | INTRAMUSCULAR | Status: DC

## 2023-03-05 MED ORDER — SPIRONOLACTONE 25 MG PO TABS
50.0000 mg | ORAL_TABLET | Freq: Every day | ORAL | Status: DC
  Administered 2023-03-06 – 2023-03-10 (×5): 50 mg via ORAL
  Filled 2023-03-05 (×5): qty 2

## 2023-03-05 MED ORDER — IPRATROPIUM-ALBUTEROL 0.5-2.5 (3) MG/3ML IN SOLN
3.0000 mL | Freq: Four times a day (QID) | RESPIRATORY_TRACT | Status: DC
  Administered 2023-03-05 – 2023-03-06 (×3): 3 mL via RESPIRATORY_TRACT
  Filled 2023-03-05 (×5): qty 3

## 2023-03-05 MED ORDER — SODIUM CHLORIDE 0.9 % IV SOLN
500.0000 mg | Freq: Two times a day (BID) | INTRAVENOUS | Status: AC
  Administered 2023-03-05 – 2023-03-08 (×6): 500 mg via INTRAVENOUS
  Filled 2023-03-05 (×6): qty 500

## 2023-03-05 NOTE — H&P (Addendum)
History and Physical   Richard Davenport JXB:[id number] DOB: Apr 21, 1960 DOA: 03/05/2023  PCP: Kalman Shan, MD   Patient coming from: Pulmonology clinic  Chief Complaint: Acute on chronic respiratory failure  HPI: Richard Davenport is a 63 y.o. male with medical history significant of hypertension, hyperlipidemia, diabetes, TIA/CVA, cerebral aneurysm status post coil, cirrhosis, interstitial lung disease, chronic respiratory failure with hypoxia presenting with worsening shortness of breath.  Patient has known history of progressive pulmonary fibrosis/ILD with recent admission last month for this.  At that time oxygen requirement at baseline was 5 L and he had this exacerbation treated with steroids and breathing treatments as well as home pirfenidone.  Ultimately discharged on steroid taper and some antibiotics and home oxygen requirement had been increased to 8-10 L.    Pulmonary follow-up today and on his home 8 to 10 L he was desaturating into the 40s.  This improved on 15 L nonrebreather.  Sent to the ED for further evaluation.  Patient has had some cough for about a day but otherwise has not noticed any significant shortness of breath.  There has been concern during recent admission that he has progressed to end-stage interstitial lung disease and is not a candidate for transplant due to his concomitant cirrhosis.  Was seen by palliative last admission but decided remain full scope and full code at that time.  Denies fevers, chills, chest pain, abdominal pain, constipation, diarrhea, nausea, vomiting.  ED Course: Vital signs in the ED notable for blood pressure in the 120s 130s systolic, heart rate 100s to 120s, respiratory in the 20s, room requiring 14 L to maintain saturations.  Lab workup included CMP with BUN 24, glucose 140, calcium 8.0, albumin less than 1.5, AST stable at 91, ALT stable at 81, alk phos 477, T. bili 1.7.  CBC with hemoglobin stable at 9.8, platelets stable at  115.  Respiratory panel for flu COVID and RSV negative.  VBG with pH 7.4 4, pCO2 normal.  Chest x-ray with low lung volumes and diffuse bilateral interstitial changes consistent with fibrosis.  Questionable superimposed edema versus other on the left.  No initial interventions in the ED.  I have requested pulmonary consultation to assist in management and explanation of patient's disease progression and further conversations along with palliative who was already consulted by the ED provider.  Review of Systems: As per HPI otherwise all other systems reviewed and are negative.  Past Medical History:  Diagnosis Date   AKI (acute kidney injury) (HCC) 08/27/2013   Altered mental status 10/09/2012   Asthma    Diabetes mellitus without complication (HCC)    Headache    Idiopathic pulmonary fibrosis (HCC)    Right thalamic infarction [facility]) s/p tPA 04/10/2018   Stroke (HCC)    Subarachnoid hemorrhage (HCC)    TIA (transient ischemic attack) 08/26/2013    Past Surgical History:  Procedure Laterality Date   ANEURYSM COILING     for bleed   LOOP RECORDER INSERTION N/A 04/10/2018   Procedure: LOOP RECORDER INSERTION;  Surgeon: Duke Salvia, MD;  Location: [facility] INVASIVE CV LAB;  Service: Cardiovascular;  Laterality: N/A;   RADIOLOGY WITH ANESTHESIA N/A 10/09/2012   Procedure: RADIOLOGY WITH ANESTHESIA;  Surgeon: Lisbeth Renshaw, MD;  Location: [facility] OR;  Service: Radiology;  Laterality: N/A;   RADIOLOGY WITH ANESTHESIA N/A 05/27/2013   Procedure: RADIOLOGY WITH ANESTHESIA;  Surgeon: Lisbeth Renshaw, MD;  Location: [facility] OR;  Service: Radiology;  Laterality: N/A;    Social History  reports that he has never smoked. He has been exposed to tobacco smoke. He has never used smokeless tobacco. He reports that he does not drink alcohol and does not use drugs.  Allergies  Allergen Reactions   Pork-Derived Products Other (See Comments)    Patient is Muslim and PREFERS TO NOT TAKE ANY PORK OR MEAT PRODUCTS  (only fish)    Family History  Problem Relation Age of Onset   Hypertension Mother    Hypertension Father   Reviewed on admission  Prior to Admission medications   Medication Sig Start Date End Date Taking? Authorizing Provider  albuterol (PROVENTIL) (2.5 MG/3ML) 0.083% nebulizer solution Take 3 mLs (2.5 mg total) by nebulization every 4 (four) hours as needed for wheezing or shortness of breath. 01/07/23   Kozlow, Alvira Philips, MD  albuterol (VENTOLIN HFA) 108 (90 Base) MCG/ACT inhaler Inhale 2 puffs into the lungs every 6 (six) hours as needed for wheezing or shortness of breath. 01/07/23   Kozlow, Alvira Philips, MD  budesonide (PULMICORT) 0.5 MG/2ML nebulizer solution Take 2 mLs (0.5 mg total) by nebulization in the morning, at noon, in the evening, and at bedtime. 01/07/23   Kozlow, Alvira Philips, MD  feeding supplement (ENSURE ENLIVE / ENSURE PLUS) LIQD Take 237 mLs by mouth 2 (two) times daily between meals. 02/15/23   Marguerita Merles Latif, DO  furosemide (LASIX) 40 MG tablet Take 0.5 tablets (20 mg total) by mouth daily. Patient taking differently: Take 20 mg by mouth daily as needed for fluid. 05/20/22 05/20/23  Arnetha Courser, MD  gabapentin (NEURONTIN) 300 MG capsule Take 300 mg by mouth 3 (three) times daily. 08/28/22   [provider]  guaiFENesin-dextromethorphan (ROBITUSSIN DM) 100-10 MG/5ML syrup Take 15 mLs by mouth every 8 (eight) hours as needed for cough.    [provider]  metFORMIN (GLUCOPHAGE) 500 MG tablet Take 1 tablet (500 mg total) by mouth 2 (two) times daily. Patient taking differently: Take 500 mg by mouth daily. 12/14/17   Linwood Dibbles, MD  mometasone (ELOCON) 0.1 % ointment Apply topically daily. 1 application to left ear once daily. 01/07/23   Kozlow, Alvira Philips, MD  Multiple Vitamin (MULTIVITAMIN WITH MINERALS) TABS tablet Take 1 tablet by mouth daily.    [provider]  omeprazole (PRILOSEC) 20 MG capsule Take 20 mg by mouth daily as needed (for heartburn).     [provider]  ondansetron (ZOFRAN) 4 MG tablet Take 1 tablet (4 mg total) by mouth every 6 (six) hours as needed for nausea. 02/15/23   Marguerita Merles Latif, DO  Pirfenidone 267 MG TABS Take 3 tablets (801 mg total) by mouth with breakfast, with lunch, and with evening meal. Month 2 and onwards Patient taking differently: Take 534 mg by mouth with breakfast, with lunch, and with evening meal. 03/13/22   Olalere, Adewale A, MD  predniSONE (DELTASONE) 10 MG tablet Take 4 tablets (40 mg total) by mouth daily with breakfast for 5 days, THEN 3 tablets (30 mg total) daily with breakfast for 5 days, THEN 2 tablets (20 mg total) daily with breakfast for 5 days, THEN 1 tablet (10 mg total) daily with breakfast. 02/15/23 04/01/23  Marguerita Merles Latif, DO  riTUXimab (RITUXAN) 500 MG/50ML injection Inject 1,000 mg into the vein every 6 (six) months.    [provider]  senna-docusate (SENOKOT-S) 8.6-50 MG tablet Take 1 tablet by mouth at bedtime as needed for mild constipation. 02/15/23   Marguerita Merles Latif, DO  spironolactone (ALDACTONE)  50 MG tablet Take 50 mg by mouth daily. 05/06/22 05/06/23  [provider]    Physical Exam: Vitals:   03/05/23 1128 03/05/23 1245 03/05/23 1300 03/05/23 1330  BP:  128/87 131/86 124/89  Pulse:  (!) 122 (!) 112 (!) 101  Resp:  (!) 38 (!) 23 (!) 35  Temp:      TempSrc:      SpO2:  99% 100% 100%  Weight: 72.6 kg     Height: 5\' 8"  (1.727 m)       Physical Exam Constitutional:      General: He is not in acute distress.    Appearance: Normal appearance.  HENT:     Head: Normocephalic and atraumatic.     Mouth/Throat:     Mouth: Mucous membranes are moist.     Pharynx: Oropharynx is clear.  Eyes:     Extraocular Movements: Extraocular movements intact.     Pupils: Pupils are equal, round, and reactive to light.  Cardiovascular:     Rate and Rhythm: Normal rate and regular rhythm.     Pulses: Normal pulses.     Heart sounds: Normal heart  sounds.  Pulmonary:     Effort: Pulmonary effort is normal. No respiratory distress.     Breath sounds: Rales present.  Abdominal:     General: Bowel sounds are normal. There is no distension.     Palpations: Abdomen is soft.     Tenderness: There is no abdominal tenderness.  Musculoskeletal:        General: No swelling or deformity.  Skin:    General: Skin is warm and dry.  Neurological:     General: No focal deficit present.     Mental Status: Mental status is at baseline.    Labs on Admission: I have personally reviewed following labs and imaging studies  CBC: Recent Labs  Lab 03/05/23 1207 03/05/23 1217  WBC 7.4  --   NEUTROABS 6.9  --   HGB 9.8* 10.9*  HCT 31.4* 32.0*  MCV 116.7*  --   PLT 115*  --     Basic Metabolic Panel: Recent Labs  Lab 03/05/23 1207 03/05/23 1217  NA 138 144  K 4.2 4.3  CL 102  --   CO2 30  --   GLUCOSE 140*  --   BUN 24*  --   CREATININE 0.74  --   CALCIUM 8.0*  --     GFR: Estimated Creatinine Clearance: 95 mL/min (by C-G formula based on SCr of 0.74 mg/dL).  Liver Function Tests: Recent Labs  Lab 03/05/23 1207  AST 91*  ALT 81*  ALKPHOS 477*  BILITOT 1.7*  PROT 8.1  ALBUMIN <1.5*    Urine analysis:    Component Value Date/Time   COLORURINE AMBER (A) 12/19/2022 1604   APPEARANCEUR CLEAR 12/19/2022 1604   LABSPEC 1.021 12/19/2022 1604   PHURINE 6.0 12/19/2022 1604   GLUCOSEU NEGATIVE 12/19/2022 1604   HGBUR SMALL (A) 12/19/2022 1604   BILIRUBINUR NEGATIVE 12/19/2022 1604   KETONESUR NEGATIVE 12/19/2022 1604   PROTEINUR NEGATIVE 12/19/2022 1604   UROBILINOGEN 0.2 05/27/2014 1230   NITRITE NEGATIVE 12/19/2022 1604   LEUKOCYTESUR NEGATIVE 12/19/2022 1604    Radiological Exams on Admission: DG Chest Port 1 View Result Date: 03/05/2023 CLINICAL DATA:  Shortness of breath.  History of pulmonary fibrosis. EXAM: PORTABLE CHEST 1 VIEW COMPARISON:  Chest radiograph dated 02/15/2023. FINDINGS: Low lung volumes. Stable  enlargement of the cardiac silhouette. Diffuse bilateral interstitial changes  are again noted compatible with history of pulmonary fibrosis. There is asymmetric increased interstitial density within the left lung. Similar suspected small right pleural effusion. No acute osseous abnormality. IMPRESSION: 1. Low lung volumes. Diffuse bilateral interstitial changes compatible with history of pulmonary fibrosis. Increased interstitial opacification within the left lung is concerning for superimposed asymmetric edema or an infectious/inflammatory etiology. 2. Similar suspected small right pleural effusion. Electronically Signed   By: Hart Robinsons M.D.   On: 03/05/2023 14:08   EKG: Independently reviewed.  Sinus tachycardia at 109 bpm.  Nonspecific T wave flattening.  Assessment/Plan Active Problems:   Type 2 diabetes mellitus with hyperglycemia (HCC)   Elevated LFTs   Cerebral aneurysm, nonruptured   Hyperlipidemia   Hx of basliar aneurysm coil and stent   Cirrhosis (HCC)   Lung disease, interstitial (HCC)   Essential hypertension   Acute on chronic respiratory failure with hypoxia (HCC)   Acute on chronic respite failure hypoxia ILD exacerbation > Patient presenting with exacerbation of ILD versus progressive worsening. > Severe known disease has been followed by [facility] [place].  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 > Not a candidate for transplant, in part due to cirrhosis. > Baseline oxygen requirement was 5 L and after recent admission was 8-10 L, now hypoxic on 10 L and requiring 14-15 L. > Introduced to palliative care during last admission in January but decided to remain full scope and full code at that time.  Palliative reconsulted by EDP. > Pulmonology also consulted to further guide management and discuss disease progression with patient. - Monitor on progressive unit - Continue supple oxygen, wean as able - Appreciate pulmonology recommendations and assistance - Appreciate  palliative medicine recommendations and assistance - Viral panel, Procalcitonin, CTA, Abx recommended by Pulm - 125 mg Solu-Medrol, followed by 40 mg twice daily - Scheduled DuoNebs while awake - Continue home Pulmicort - Continue.  Albuterol - Continue home pirfenidone - On Rituxan outpatient  - Supportive care  Cirrhosis > Known history of cirrhosis which was discovered incidentally on imaging per chart review.  Family history of Elita Boone. > History of ascites. - AST and ALT stable at 91 and 81 respectively.  T. bili 1.7.  Platelets stable at 115.  Albumin less than 1.5. - Currently on Lasix as needed - Continue spironolactone  Hypertension - Continue home spironolactone  Diabetes - SSI  History of TIA/CVA - Noted  History of cerebral aneurysm - Status post coiling  Hypergammaglobulinemia Inflammatory dermatosis - Followed by allergy and immunology with workup ongoing.  There has been attempts made to get a send out panel done.  Does not appear to have been done yet, and likely difficult to do inpatient.  Unlike any acute on outpatient and pending results of further conversation/palliative evaluation.   DVT prophylaxis: SCDs for now, declines pork derived products Code Status:   DNR, following discussion with Pulm, Palliative consulted Family Communication:  Updated at bedside  Disposition Plan:   Patient is from:  Home  Anticipated DC to:  Pending clinical course  Anticipated DC date:  2 to 7 days  Anticipated DC barriers: None  Consults called:  Pulmonology, palliative medicine Admission status:  Inpatient, progressive  Severity of Illness: The appropriate patient status for this patient is INPATIENT. Inpatient status is judged to be reasonable and necessary in order to provide the required intensity of service to ensure the patient's safety. The patient's presenting symptoms, physical exam findings, and initial radiographic and laboratory data in the context  of their  chronic comorbidities is felt to place them at high risk for further clinical deterioration. Furthermore, it is not anticipated that the patient will be medically stable for discharge from the hospital within 2 midnights of admission.   * I certify that at the point of admission it is my clinical judgment that the patient will require inpatient hospital care spanning beyond 2 midnights from the point of admission due to high intensity of service, high risk for further deterioration and high frequency of surveillance required.Synetta Fail MD [facility]  How to contact the [facility] Attending or Consulting provider 7A - 7P or covering provider during after hours 7P -7A, for this patient?   Check the care team in [facility] and look for a) attending/consulting [facility] provider listed and b) the [facility] team listed Log into [email or web address] and use [place]'s universal password to access. If you do not have the password, please contact the hospital operator. Locate the [facility] provider you are looking for under [facility] and page to a number that you can be directly reached. If you still have difficulty reaching the provider, please page the [facility] (Director on Call) for the Hospitalists listed on amion for assistance.  03/05/2023, 2:31 PM

## 2023-03-05 NOTE — ED Notes (Signed)
Patient having runs of SVT

## 2023-03-05 NOTE — Patient Instructions (Addendum)
ICD-10-CM   1. Acute on chronic respiratory failure with hypoxia (HCC)  J96.21     2. Idiopathic pulmonary fibrosis (HCC)  J84.112     3. Hospital discharge follow-up  Z09     4. Elevated IgE level  R76.8       Acute on chronic respiratory failure with hypoxia (HCC) Idiopathic pulmonary fibrosis [facility]) Hospital discharge follow-up  -Currently worst pulse ox 45% on 10 L oxygen and corrected on 90% on 15 L oxygen.  Class IV dyspnea.  I am concerned that you are having progressive worsening in pulmonary fibrosis and a life expectancy is diminished.  Plan  - Call emergency services to take you to the emergency department.  Elevated IgE level  -Dr. 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 Francesco Sor  Goals of care   -Your life expectancy is very diminished and you have an incurable problem.  I recommend hospitalization immediately.  In addition I recommend conversation with your providers in the hospital about your diminished life expectancy and getting to an alignment of focusing on your quality of life for the remainder of your natural life.  Plan  - Requires inpatient palliative care goals of care -Recommend hospice  Abnormal liver CT and cirrhosis with ascites and MELD score  - -Noted [employer] said you not a candidate for lung transplantation in May/June 2024  Plan  - This makes your prognosis worse   Followup -   -1 months ; 30 min visit with Dr Marchelle Gearing or APP  -Cancel this if you are under hospice care

## 2023-03-05 NOTE — Progress Notes (Signed)
OV 03/26/2022 -transferred to the ILD center with Dr. Marchelle Gearing by Dr. Val Eagle.  Patient will "be comanaged with [facility] Dr. Raynelle Fanning fried  Subjective:  Patient ID: Richard Davenport, male , DOB: 11-04-1960 , age 63 y.o. , MRN: [id number] , ADDRESS: [address] [facility] [place] [address] PCP Pcp, No Patient Care Team: Pcp, No as PCP - General  This Provider for this visit: Treatment Team:  Attending Provider: Kalman Shan, MD    03/26/2022 -   Chief Complaint  Patient presents with   Consult    ILD     HPI Richard Davenport 63 y.o. -I am meeting him for the first time.  His daughter Kirt Boys is with him and she is the main independent historian.  He is giving some of the history.  He is originally from [place].  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  He initially saw Dr. Val Eagle in our office and was diagnosed with ILD and subsequently referred to the [facility] ILD center.  He that he saw Dr. Raynelle Fanning fried.  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  He has been started on low-dose Esbriet protocol and also CellCept.  He started pirfenidone/Esbriet in January 2024 and CellCept around mid February 2024.  Sophia is tolerating these medicines well.  He has been attending pulm rehabilitation but he is desaturating the significantly and the need portable oxygen.  The daughter also states that extremely fatigued after rehab despite oxygen use at rehab.  She wants to pause rehabilitation for the moment.  As part of his ILD workup incidental diagnose of cirrhosis been made in the CT scan.  His mother had NASH.  He does not have any jaundice apparently hepatitis panel is negative.  He has liver clinic appointment at [facility].  He has not done the ILD questionnaire with [place].  His current symptom score is as below  They are going to see [facility] only every 6 months they want more frequent follow-up.  I offered him every 6 months with [place] such that he is  seeing 1 of [place] every 3 months.  He and his daughter are happy with that plan.  He is getting his labs safety monitoring supervised by [place] daughter wants me to place an order to get it done here every 2 weeks and Dr. Raynelle Fanning freed will then monitor his labs.  He has been considered too early for lung transplantation.     Lab test - At [facility] 01/24/2022 anti-PL-total antibody weakly positive   CT Chest data - HRCT 01/25/22  Narrative & Impression  CLINICAL DATA:  Idiopathic pulmonary fibrosis   EXAM: CT CHEST WITHOUT CONTRAST   TECHNIQUE: Multidetector CT imaging of the chest was performed following the standard protocol without intravenous contrast. High resolution imaging of the lungs, as well as inspiratory and expiratory imaging, was performed.   RADIATION DOSE REDUCTION: This exam was performed according to the departmental dose-optimization program which includes automated exposure control, adjustment of the mA and/or kV according to patient size and/or use of iterative reconstruction technique.   COMPARISON:  CT abdomen pelvis, 07/09/2015   FINDINGS: Cardiovascular: No significant vascular findings. Normal heart size. No pericardial effusion.   Mediastinum/Nodes: Prominent subcentimeter mediastinal and hilar lymph nodes. Thyroid gland, trachea, and esophagus demonstrate no significant findings.   Lungs/Pleura: Moderate pulmonary fibrosis in a pattern with apical to basal gradient, featuring irregular peripheral interstitial opacity,  septal thickening, traction bronchiectasis, subpleural bronchiolectasis, and areas of honeycombing at the lung bases. Fibrotic findings are markedly worsened in comparison to prior imaging of the abdomen and pelvis dated 07/09/2015. No significant air trapping on expiratory phase imaging. No pleural effusion or pneumothorax.   Upper Abdomen: Coarse, nodular contour of the liver. Partially imaged splenomegaly. Small  volume perihepatic and perisplenic ascites.   Musculoskeletal: No chest wall abnormality. No acute osseous findings.   IMPRESSION: 1. Moderate pulmonary fibrosis in a pattern with apical to basal gradient, featuring irregular peripheral interstitial opacity, septal thickening, traction bronchiectasis, subpleural bronchiolectasis, and areas of honeycombing at the lung bases. Fibrotic findings are markedly worsened in comparison to prior imaging of the abdomen and pelvis dated 07/09/2015. Findings are consistent with UIP per consensus guidelines: Diagnosis of Idiopathic Pulmonary Fibrosis: An Official ATS/ERS/JRS/ALAT Clinical Practice Guideline. Am Rosezetta Schlatter Crit Care Med Vol 198, Iss 5, [phone], Sep 21 2016. 2. Prominent subcentimeter mediastinal and hilar lymph nodes, likely reactive to fibrosis. 3. Cirrhosis, partially imaged splenomegaly, and ascites in the included upper abdomen.     Electronically Signed   By: Jearld Lesch M.D.   On: 01/28/2022 13:06     No results found.  ECHO 01/24/22  IMPRESSIONS     1. Left ventricular ejection fraction, by estimation, is 60 to 65%. Left  ventricular ejection fraction by 3D volume is 60 %. The left ventricle has  normal function. The left ventricle has no regional wall motion  abnormalities. Left ventricular diastolic   parameters were normal. The average left ventricular global longitudinal  strain is -25.9 %. The global longitudinal strain is normal.   2. Right ventricular systolic function is normal. The right ventricular  size is normal. There is normal pulmonary artery systolic pressure. The  estimated right ventricular systolic pressure is 24.2 mmHg.   3. The mitral valve is normal in structure. Trivial mitral valve  regurgitation. No evidence of mitral stenosis.   4. The aortic valve is tricuspid. Aortic valve regurgitation is not  visualized. Aortic valve sclerosis/calcification is present, without any  evidence of  aortic stenosis.   5. There is mild dilatation of the ascending aorta, measuring 39 mm.   6. The inferior vena cava is normal in size with greater than 50%  respiratory variability, suggesting right atrial pressure of 3 mmHg.    05/09/2022 Patient presents today for 6-8 week follow-up. He has interstitial lung disease and is followed by Dr. Marchelle Gearing. He is on Esbriet and CellCept. Established with Dr. Josph Macho with [facility] and Dr. Mauri Reading with [facility] for cirrhosis work up.  He is accompanied by his daughter.  Shortness of breath has been worse over the last month and a half with activity.  Feels this is related to fluid containing in abdomen and legs.  He saw a GI specialist at [facility] and was started on furosemide and spironolactone.  Esbriet currently on hold per Dr. Foy Guadalajara due to elevated LFTs.  He recently was prescribed prednisone taper which has helped with fatigue.  He has a chronic dry cough that is mainly present in the morning. He is wanting to return to pulmonary rehab. Seeing Dr. Foy Guadalajara May 2-3 and will re-assess antifibrotics /along with transplant consideration.   IgE 1,589 (2,027); eosinophils 300 Allergies to [place] grass, [place], roach, dog dander, dust mites Patient will be seen Dr. Lucie Leather with allergy and asthma on May 21       OV 07/01/2022  Subjective:  Patient ID:  Richard Davenport, male , DOB: [birth year] , age 44 y.o. , MRN: [id number] , ADDRESS: [address] [facility] [place] [address] PCP No primary care provider on file. No care team member to display  This Provider for this visit: Treatment Team:  Attending Provider: Kalman Shan, MD    07/01/2022 -   Chief Complaint  Patient presents with   Follow-up    F/up on IPF      OV 11/01/2022  Subjective:  Patient ID: Richard Davenport, male , DOB: March 30, 1960 , age 68 y.o. , MRN: [id number] , ADDRESS: [address] [facility] [place] [address] PCP Kalman Shan, MD Patient Care  Team: Kalman Shan, MD as PCP - General (Pulmonary Disease)  This Provider for this visit: Treatment Team:  Attending Provider: Kalman Shan, MD   HPI Richard Davenport 63 y.o. -returns for follow-up.  Daughter Kirt Boys is not here.  Daughter Madlyn Frankel is here today.  Daughter is an independent historian.  She does tell that overall he is stable.  Patient also affirms the same.  Daughter admitted that patient is no longer on CellCept.  Between the 2 of them and the chart that I learned that he has now gotten Rituxan at our office but ordered through [facility].  Review of the medical records indicate that mid May 2024 Dr. Foy Guadalajara took him off CellCept and put him on Rituxan with high-dose steroids.  She got this all within the last few weeks.  His dyspnea itself is stable.  He uses oxygen with exertion.  He is now quit working.  His symptom scores are below and he is stable.  Other issues - Cirrhosis details captured above on chart review -Transplant evaluation captured above in chart review   Liver function test  -His bilirubin is normal.  AST 64 and ALT 65.  He continues to be on pirfenidone.  Most recent is 06/27/2022  Past  - elevated IgE/Eos with seasonal allergies: saw Dr Lucie Leather in May 2024. Alpha 1 and Short telomoeres ordered  11/01/2022 -   Chief Complaint  Patient presents with   Follow-up    Pt is coughing, sob pt states it is worse than his normal. Pt states he is using his inhaler.      HPI Richard Davenport 63 y.o. -returns for follow-up.  Presents with his son.  His main complaint today is coughing but him and review of the medical records on 08/28/2022 he saw Raynelle Fanning freed at [facility].  He has been started on Rituxan since May 2024.  He gets 2 doses every 6 months.  He is also on pirfenidone and tolerating it well.  Appears he is off CellCept.  He is currently on full dose pirfenidone and tolerating it well.  He is using supplemental oxygen.  When she saw her on 08/28/2023  complained of significant cough.  She put him on gabapentin but he still says he is cough.  He does have elevated IgE.  He has an upcoming appointment around Thanksgiving 2024 with Dr. Lucie Leather.  I encouraged him to keep up with that.  But I will give him a short course of steroids.  Symptom wise overall he is stable.   In July 2024 he saw gastroenterology at [facility] and his MELD score was 21 and remained stable.  Will check ammonia level today just to get a baseline.  He does have some asterixis.     OV 03/05/2023  Subjective:  Patient ID: Richard Davenport, male , DOB: [birth year] ,  age 57 y.o. 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  -Primary ILD provider is Dr. Josph Macho at [facility]  -Support of ILD providers Dr. Marchelle Gearing at [facility]  -Considered for lung transplant candidate 05/24/2022 by [facility] Mol[facility]  -Last visit at [facility] May 2024: CellCept stopped Esbriet restarted and recommended Solu-Medrol and Rituxan.  -Telomere testing sent from [facility] to at [facility] SYNDROME  #Cirrhosis of the liver  -Care under [facility] initial consult 05/06/2022  - MELD 3.0 score is 17  - April 2024   -MELD sodium score 21 on 05/23/2022 and 08/05/2022.  -Start her on spironolactone and Lasix and low-salt diet mid April 2024  -Noticed to have moderate ascites on ultrasound June 2024  -Normal alpha-1 antitrypsin  # 6 hypoxemia.  03/05/2023 -   Chief Complaint  Patient  presents with   Follow-up    Sats upon arrival 46% 8lpm---increased to 99% 15lpm NRB.      HPI Richard Davenport 63 y.o. - since las visit admitted for 4 days and dischated 02/15/23 for ILD flare .  Presents with his son.  As soon as he sat his pulse ox was 45% on 10 L oxygen which is his baseline at home.  The son and he said that he has been managing.  The son states that when he exerts himself he desaturates but the patient states that his pulse ox goes up when he exerts.  They feel it the wife is able to help and they are able to manage.  They have no recollection of having goals of care conversation in the hospital although Dr. [name] told me that this was done and the family insisted on full code.  He is denying any fever or edema.  At this point in time I indicated to him and his son that we need to call emergency medical services and sent him to the emergency department because he is life-threatening hypoxemic respiratory failure acute on chronic.  I told him that he has advanced pulmonary fibrosis and his life expectancy is diminished and definitely less than 1 year and could be few weeks to few months to several months.  Explained to him that he needs to focus on quality of life for the remainder of natural life.  Explained that to him and his son.  They appear a little bit surprised.  Of note Dr. Lucie Leather  requires special genetic test sent to [facility] but because of his acute illness I had to defer this at this point I have communicated this with his allergist.  We will see if the hospitalist can send this from the hospital if possible.  Is a special send out with special form requirement.  The family needs to be aligned with this as well.   SYMPTOM SCALE - ILD 03/26/2022 05/09/2022  07/01/2022  11/08/2022   Current weight   178lbs    O2 use ra RA ra ra  Shortness of Breath 0 -> 5 scale with 5 being worst (score 6 If unable to do)  At rest 1.5 0 1 0  Simple tasks -  showers, clothes change, eating, shaving 3 5 3 4   Household (dishes, doing bed, laundry) 4 5 4 4   Shopping 3 3 4 4   Walking level at own pace 3 3 3 4   Walking up Stairs 4 5 5 5   Total (30-36) Dyspnea Score 18.5 21 20 21   How bad is your cough? 3 2 3 4   How bad is your fatigue Tires esp after rehab 3 3 3   How bad is nausea 0 0 1 0  How bad is vomiting?   0 0 0 0  How bad is diarrhea? 0 0 0 00  How bad is anxiety? x 0 1 0  How bad is depression x 0 1 0  Any chronic pain - if so where and how bad x 0 x       Simple office walk 185 feet x  3 laps goal with forehead probe 03/26/2022    05/09/2022  11/08/2022   O2 used ra  RA ra  Number laps completed 1-2 laps out of 3  3 laps  Sit stand x 10  Comments about pace x  x   Resting Pulse Ox/HR 98% and 86/min  92% and 90/min 95%a nd HR 100  Final Pulse Ox/HR 86% and 104/min  87% and 96/min 78% ad HR 115  Desaturated </= 88% yes  yes   Desaturated <= 3% points yes  yes   Got Tachycardic >/= 90/min 12 pints  yes    Symptoms at end of test x  x   Miscellaneous comments Needed 3L to correct   Needs 3-4L to correct        PFT     Latest Ref Rng & Units 01/04/2022    9:31 AM  PFT Results  FVC-Pre L 2.96   FVC-Predicted Pre % 65   FVC-Post L 2.95   FVC-Predicted Post % 64   Pre FEV1/FVC % % 83   Post FEV1/FCV % % 86   FEV1-Pre L 2.47   FEV1-Predicted Pre % 71   FEV1-Post L 2.54   DLCO uncorrected ml/min/mmHg 10.60   DLCO UNC% % 39   DLCO corrected ml/min/mmHg 10.60   DLCO COR %Predicted % 39   DLVA Predicted % 64        LAB RESULTS last 96 hours No results found.       has a past medical history of Asthma, Diabetes mellitus without complication (HCC), Headache, Idiopathic pulmonary fibrosis (HCC), Stroke (HCC), and Subarachnoid hemorrhage (HCC).   reports that he has never smoked. He has been exposed to tobacco smoke. He has never used smokeless tobacco.  Past Surgical History:  Procedure Laterality Date   ANEURYSM  COILING     for bleed   LOOP RECORDER INSERTION N/A 04/10/2018   Procedure: LOOP RECORDER INSERTION;  Surgeon: Duke Salvia, MD;  [facility] INVASIVE CV LAB;  Service: Cardiovascular;  Laterality: N/A;   RADIOLOGY WITH ANESTHESIA N/A 10/09/2012   Procedure: RADIOLOGY WITH ANESTHESIA;  Surgeon: Lisbeth Renshaw, MD;  Location: [facility] OR;  Service: Radiology;  Laterality: N/A;   RADIOLOGY WITH ANESTHESIA N/A 05/27/2013   Procedure: RADIOLOGY WITH ANESTHESIA;  Surgeon: Lisbeth Renshaw, MD;  Location: [facility] OR;  Service: Radiology;  Laterality: N/A;    Allergies  Allergen Reactions   Pork-Derived Products Other (See Comments)    Patient is Muslim and PREFERS TO NOT TAKE ANY PORK OR MEAT PRODUCTS (only fish)  Immunization History  Administered Date(s) Administered   Influenza Whole 11/05/2021   Influenza,inj,Quad PF,6+ Mos 10/13/2012   Moderna Sars-Covid-2 Vaccination 04/25/2019, 05/23/2019, 01/24/2020   PNEUMOCOCCAL CONJUGATE-20 01/24/2022   Tdap 05/25/2020    Family History  Problem Relation Age of Onset   Hypertension Mother    Hypertension Father      Current Outpatient Medications:    albuterol (PROVENTIL) (2.5 MG/3ML) 0.083% nebulizer solution, Take 3 mLs (2.5 mg total) by nebulization every 4 (four) hours as needed for wheezing or shortness of breath., Disp: 200 mL, Rfl: 1   albuterol (VENTOLIN HFA) 108 (90 Base) MCG/ACT inhaler, Inhale 2 puffs into the lungs every 6 (six) hours as needed for wheezing or shortness of breath., Disp: 18 g, Rfl: 2   budesonide (PULMICORT) 0.5 MG/2ML nebulizer solution, Take 2 mLs (0.5 mg total) by nebulization in the morning, at noon, in the evening, and at bedtime., Disp: 240 mL, Rfl: 5   feeding supplement (ENSURE ENLIVE / ENSURE PLUS) LIQD, Take 237 mLs by mouth 2 (two) times daily between meals., Disp: 237 mL, Rfl: 12   furosemide (LASIX) 40 MG tablet, Take 0.5 tablets (20 mg total) by mouth daily. (Patient taking differently: Take 20 mg by  mouth daily as needed for fluid.), Disp: 30 tablet, Rfl: 0   gabapentin (NEURONTIN) 300 MG capsule, Take 300 mg by mouth 3 (three) times daily., Disp: , Rfl:    guaiFENesin-dextromethorphan (ROBITUSSIN DM) 100-10 MG/5ML syrup, Take 15 mLs by mouth every 8 (eight) hours as needed for cough., Disp: , Rfl:    metFORMIN (GLUCOPHAGE) 500 MG tablet, Take 1 tablet (500 mg total) by mouth 2 (two) times daily. (Patient taking differently: Take 500 mg by mouth daily.), Disp: 60 tablet, Rfl: 1   mometasone (ELOCON) 0.1 % ointment, Apply topically daily. 1 application to left ear once daily., Disp: 135 g, Rfl: 1   Multiple Vitamin (MULTIVITAMIN WITH MINERALS) TABS tablet, Take 1 tablet by mouth daily., Disp: , Rfl:    omeprazole (PRILOSEC) 20 MG capsule, Take 20 mg by mouth daily as needed (for heartburn)., Disp: , Rfl:    ondansetron (ZOFRAN) 4 MG tablet, Take 1 tablet (4 mg total) by mouth every 6 (six) hours as needed for nausea., Disp: 20 tablet, Rfl: 0   Pirfenidone 267 MG TABS, Take 3 tablets (801 mg total) by mouth with breakfast, with lunch, and with evening meal. Month 2 and onwards (Patient taking differently: Take 534 mg by mouth with breakfast, with lunch, and with evening meal.), Disp: 270 tablet, Rfl: 5   predniSONE (DELTASONE) 10 MG tablet, Take 4 tablets (40 mg total) by mouth daily with breakfast for 5 days, THEN 3 tablets (30 mg total) daily with breakfast for 5 days, THEN 2 tablets (20 mg total) daily with breakfast for 5 days, THEN 1 tablet (10 mg total) daily with breakfast., Disp: 75 tablet, Rfl: 0   riTUXimab (RITUXAN) 500 MG/50ML injection, Inject 1,000 mg into the vein every 6 (six) months., Disp: , Rfl:    senna-docusate (SENOKOT-S) 8.6-50 MG tablet, Take 1 tablet by mouth at bedtime as needed for mild constipation., Disp: 30 tablet, Rfl: 0   spironolactone (ALDACTONE) 50 MG tablet, Take 50 mg by mouth daily., Disp: , Rfl:       Objective:   Vitals:   03/05/23 1016  Pulse: (!) 122   SpO2: 96%    Estimated body mass index is 22.2 kg/m as calculated from the following:   Height as of  02/11/23: 5\' 8"  (1.727 m).   Weight as of 02/15/23: 146 lb (66.2 kg).  @WEIGHTCHANGE @  There were no vitals filed for this visit.   Physical Exam   General: No distress. Class 4 dyspne.  O2 at rest: YES 15L [place] - 90% FACE MASSK Cane present: no Sitting in wheel chair: .  Frail: YES Obese: no Neuro: Alert and Oriented x 3. GCS 15. Speech normal Psych: Pleasant Resp:  Barrel Chest - no.  Wheeze - no, Crackles - yes , No overt respiratory distress CVS: Normal heart sounds. Murmurs - no Ext: Stigmata of Connective Tissue Disease - no HEENT: Normal upper airway. PEERL +. No post nasal drip        Assessment:       ICD-10-CM   1. Acute on chronic respiratory failure with hypoxia (HCC)  J96.21     2. Idiopathic pulmonary fibrosis (HCC)  J84.112     3. Hospital discharge follow-up  Z09     4. Elevated IgE level  R76.8     5. Goals of care, counseling/discussion  Z71.89          Plan:     Patient Instructions     ICD-10-CM   1. Acute on chronic respiratory failure with hypoxia (HCC)  J96.21     2. Idiopathic pulmonary fibrosis (HCC)  J84.112     3. Hospital discharge follow-up  Z09     4. Elevated IgE level  R76.8       Acute on chronic respiratory failure with hypoxia (HCC) Idiopathic pulmonary fibrosis [facility]) Hospital discharge follow-up  -Currently worst pulse ox 45% on 10 L oxygen and corrected on 90% on 15 L oxygen.  Class IV dyspnea.  I am concerned that you are having progressive worsening in pulmonary fibrosis and a life expectancy is diminished.  Plan  - Call emergency services to take you to the emergency department.  Elevated IgE level  -Dr. 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 Francesco Sor  Goals of care   -Your life expectancy is very diminished and you have an incurable problem.  I recommend hospitalization immediately.  In addition I recommend conversation with your providers in the hospital about your diminished life expectancy and getting to an alignment of focusing on your quality of life for the remainder of your natural life.  Plan  - Requires inpatient palliative care goals of care -Recommend hospice  Abnormal liver CT and cirrhosis with ascites and MELD score  - -Noted [facility] said you not a candidate for lung transplantation in May/June 2024  Plan  - This makes your prognosis worse   Followup -   -1 months ; 30 min visit with Dr Marchelle Gearing or APP  -Cancel this if you are under hospice care     FOLLOWUP Return in about 4 weeks (around 04/02/2023) for with Dr Marchelle Gearing, with any of the APPS.    SIGNATURE    Dr. Kalman Shan, M.D., F.C.C.P,  Pulmonary and Critical Care Medicine Staff Physician, [facility] Center Director - Interstitial Lung Disease  Program  Pulmonary Fibrosis [facility] at [facility] [place], [place], [address]  Pager: [phone], If no answer or between  15:00h - 7:00h: call [phone] Telephone: [phone]  10:17 AM 03/05/2023

## 2023-03-05 NOTE — ED Provider Notes (Addendum)
Richard Davenport EMERGENCY DEPARTMENT AT [facility] Provider Note   CSN: [id number] Arrival date & time: 03/05/23  1117     History  Chief Complaint  Patient presents with   Shortness of Breath    Richard Davenport is a 63 y.o. male.  Pt is a 63 y.o. male with medical history significant for ILD/pulmonary fibrosis, chronic hypoxic respiratory failure on 8-10 L O2 , Hx of SAH s/p aneurysm coiling, hepatic cirrhosis, HLD, anemia of chronic disease, chronic thrombocytopenia who is presenting today from Dr. Eulas Post office due to severe desaturation with ambulating despite being on 10L of O2.  Stated 45% in the office that improved to 95% with NRB.  Patient reports since last night he has had a worsening cough but had not felt significantly short of breath.  Usually he uses 8 L all the time and increases it to 10 when he walks.  Patient denies any fever, congestion.  He denies any chest pain abdominal pain, nausea or vomiting.  He has not noticed any swelling.  He has been compliant with his medications.  The history is provided by the patient and a relative.  Shortness of Breath      Home Medications Prior to Admission medications   Medication Sig Start Date End Date Taking? Authorizing Provider  albuterol (PROVENTIL) (2.5 MG/3ML) 0.083% nebulizer solution Take 3 mLs (2.5 mg total) by nebulization every 4 (four) hours as needed for wheezing or shortness of breath. 01/07/23   Kozlow, Alvira Philips, MD  albuterol (VENTOLIN HFA) 108 (90 Base) MCG/ACT inhaler Inhale 2 puffs into the lungs every 6 (six) hours as needed for wheezing or shortness of breath. 01/07/23   Kozlow, Alvira Philips, MD  budesonide (PULMICORT) 0.5 MG/2ML nebulizer solution Take 2 mLs (0.5 mg total) by nebulization in the morning, at noon, in the evening, and at bedtime. 01/07/23   Kozlow, Alvira Philips, MD  feeding supplement (ENSURE ENLIVE / ENSURE PLUS) LIQD Take 237 mLs by mouth 2 (two) times daily between meals. 02/15/23    Marguerita Merles Latif, DO  furosemide (LASIX) 40 MG tablet Take 0.5 tablets (20 mg total) by mouth daily. Patient taking differently: Take 20 mg by mouth daily as needed for fluid. 05/20/22 05/20/23  Arnetha Courser, MD  gabapentin (NEURONTIN) 300 MG capsule Take 300 mg by mouth 3 (three) times daily. 08/28/22   [provider]  guaiFENesin-dextromethorphan (ROBITUSSIN DM) 100-10 MG/5ML syrup Take 15 mLs by mouth every 8 (eight) hours as needed for cough.    [provider]  metFORMIN (GLUCOPHAGE) 500 MG tablet Take 1 tablet (500 mg total) by mouth 2 (two) times daily. Patient taking differently: Take 500 mg by mouth daily. 12/14/17   Linwood Dibbles, MD  mometasone (ELOCON) 0.1 % ointment Apply topically daily. 1 application to left ear once daily. 01/07/23   Kozlow, Alvira Philips, MD  Multiple Vitamin (MULTIVITAMIN WITH MINERALS) TABS tablet Take 1 tablet by mouth daily.    [provider]  omeprazole (PRILOSEC) 20 MG capsule Take 20 mg by mouth daily as needed (for heartburn).    [provider]  ondansetron (ZOFRAN) 4 MG tablet Take 1 tablet (4 mg total) by mouth every 6 (six) hours as needed for nausea. 02/15/23   Marguerita Merles Latif, DO  Pirfenidone 267 MG TABS Take 3 tablets (801 mg total) by mouth with breakfast, with lunch, and with evening meal. Month 2 and onwards Patient taking differently: Take 534 mg by mouth with breakfast, with lunch,  and with evening meal. 03/13/22   Olalere, Adewale A, MD  predniSONE (DELTASONE) 10 MG tablet Take 4 tablets (40 mg total) by mouth daily with breakfast for 5 days, THEN 3 tablets (30 mg total) daily with breakfast for 5 days, THEN 2 tablets (20 mg total) daily with breakfast for 5 days, THEN 1 tablet (10 mg total) daily with breakfast. 02/15/23 04/01/23  Marguerita Merles Latif, DO  riTUXimab (RITUXAN) 500 MG/50ML injection Inject 1,000 mg into the vein every 6 (six) months.    [provider]  senna-docusate (SENOKOT-S) 8.6-50 MG tablet  Take 1 tablet by mouth at bedtime as needed for mild constipation. 02/15/23   Marguerita Merles Latif, DO  spironolactone (ALDACTONE) 50 MG tablet Take 50 mg by mouth daily. 05/06/22 05/06/23  [provider]      Allergies    Pork-derived products    Review of Systems   Review of Systems  Respiratory:  Positive for shortness of breath.     Physical Exam Updated Vital Signs BP 124/89   Pulse (!) 101   Temp 97.6 F (36.4 C) (Axillary)   Resp (!) 35   Ht 5\' 8"  (1.727 m)   Wt 72.6 kg   SpO2 100%   BMI 24.33 kg/m  Physical Exam Vitals and nursing note reviewed.  Constitutional:      General: He is not in acute distress.    Appearance: He is well-developed.  HENT:     Head: Normocephalic and atraumatic.  Eyes:     Conjunctiva/sclera: Conjunctivae normal.     Pupils: Pupils are equal, round, and reactive to light.  Cardiovascular:     Rate and Rhythm: Normal rate and regular rhythm.     Heart sounds: No murmur heard. Pulmonary:     Effort: Pulmonary effort is normal. Tachypnea present. No respiratory distress.     Breath sounds: Rales present. No wheezing.     Comments: Fine crackles noted throughout all lung fields Abdominal:     General: There is no distension.     Palpations: Abdomen is soft.     Tenderness: There is no abdominal tenderness. There is no guarding or rebound.  Musculoskeletal:        General: No tenderness. Normal range of motion.     Cervical back: Normal range of motion and neck supple.     Right lower leg: No edema.     Left lower leg: No edema.  Skin:    General: Skin is warm and dry.     Findings: No erythema or rash.  Neurological:     Mental Status: He is alert and oriented to person, place, and time.  Psychiatric:        Behavior: Behavior normal.     ED Results / Procedures / Treatments   Labs (all labs ordered are listed, but only abnormal results are displayed) Labs Reviewed  CBC WITH DIFFERENTIAL/PLATELET - Abnormal; Notable  for the following components:      Result Value   RBC 2.69 (*)    Hemoglobin 9.8 (*)    HCT 31.4 (*)    MCV 116.7 (*)    MCH 36.4 (*)    RDW 18.2 (*)    Platelets 115 (*)    Lymphs Abs 0.0 (*)    All other components within normal limits  COMPREHENSIVE METABOLIC PANEL - Abnormal; Notable for the following components:   Glucose, Bld 140 (*)    BUN 24 (*)    Calcium 8.0 (*)  Albumin <1.5 (*)    AST 91 (*)    ALT 81 (*)    Alkaline Phosphatase 477 (*)    Total Bilirubin 1.7 (*)    All other components within normal limits  I-STAT VENOUS BLOOD GAS, ED - Abnormal; Notable for the following components:   pH, Ven 7.442 (*)    Bicarbonate 35.2 (*)    TCO2 37 (*)    Acid-Base Excess 10.0 (*)    HCT 32.0 (*)    Hemoglobin 10.9 (*)    All other components within normal limits  RESP PANEL BY RT-PCR (RSV, FLU A&B, COVID)  RVPGX2    EKG EKG Interpretation Date/Time:  Wednesday March 05 2023 11:27:36 EST Ventricular Rate:  109 PR Interval:  129 QRS Duration:  90 QT Interval:  311 QTC Calculation: 419 R Axis:   -30  Text Interpretation: Sinus tachycardia Left axis deviation Consider anterior infarct No significant change since last tracing Confirmed by Gwyneth Sprout ([id number]) on 03/05/2023 12:28:44 PM  Radiology DG Chest Port 1 View Result Date: 03/05/2023 CLINICAL DATA:  Shortness of breath.  History of pulmonary fibrosis. EXAM: PORTABLE CHEST 1 VIEW COMPARISON:  Chest radiograph dated 02/15/2023. FINDINGS: Low lung volumes. Stable enlargement of the cardiac silhouette. Diffuse bilateral interstitial changes are again noted compatible with history of pulmonary fibrosis. There is asymmetric increased interstitial density within the left lung. Similar suspected small right pleural effusion. No acute osseous abnormality. IMPRESSION: 1. Low lung volumes. Diffuse bilateral interstitial changes compatible with history of pulmonary fibrosis. Increased interstitial opacification within  the left lung is concerning for superimposed asymmetric edema or an infectious/inflammatory etiology. 2. Similar suspected small right pleural effusion. Electronically Signed   By: Hart Robinsons M.D.   On: 03/05/2023 14:08    Procedures Procedures    Medications Ordered in ED Medications - No data to display   ED Course/ Medical Decision Making/ A&P                                 Medical Decision Making Amount and/or Complexity of Data Reviewed Labs: ordered. Decision-making details documented in ED Course. Radiology: ordered and independent interpretation performed. Decision-making details documented in ED Course. ECG/medicine tests: ordered and independent interpretation performed. Decision-making details documented in ED Course.  Risk Prescription drug management. Decision regarding hospitalization.   Pt with multiple medical problems and comorbidities and presenting today with a complaint that caries a high risk for morbidity and mortality.  Here today due to significant desaturation with ambulation.  Patient also reports increased cough since last night.  He has been compliant with his medications including his steroids.  He currently denies any complaints while he is on the nonrebreather.  He was in pulmonologist office this morning and was noted to be satting 45% after walking in from the car.  Dr. Marchelle Gearing sent the patient here from the office  reporting Dr. Lucie Leather once a special send out test to the [facility] over concern of primary immunodeficiency however he also felt the patient needed to be admitted to be evaluated by inpatient palliative care and aligned goals of care and he recommended hospice.  Recently [facility] had noted that patient is not a candidate for a lung transplant in June of last year.  I independently interpreted patient's labs and EKG.  EKG showed sinus tachycardia with no acute changes, VBG showed a mild metabolic alkalosis with a CO2 of 51, CBC  with stable hemoglobin and white count, CMP with no acute changes and viral panel is negative.  I have independently visualized and interpreted pt's images today.  Chest x-ray with findings of chronic disease which is not significantly changed per my review however radiology reports increased interstitial opacification within the left lung concerning for possible edema or infectious inflammatory etiology..  On repeat evaluation.  She is still on nonrebreather but sats are 100% even though patient is still having some tachypnea.  Discussed with him admission and palliative care consult.  Patient and his family are comfortable with this plan.  Hospitalist was admitted for admission.         Final Clinical Impression(s) / ED Diagnoses Final diagnoses:  Acute on chronic respiratory failure with hypoxia (HCC)  Pulmonary fibrosis [facility])    Rx / DC Orders ED Discharge Orders     None         Gwyneth Sprout, MD 03/05/23 1411    Gwyneth Sprout, MD 03/05/23 1414

## 2023-03-05 NOTE — Consult Note (Signed)
   NAME:  Richard Davenport, MRN:  [id number], DOB:  [birth year], LOS: 0 ADMISSION DATE:  03/05/2023, CONSULTATION DATE: 03/05/2023 REFERRING MD: Leslye Peer, MD, CHIEF COMPLAINT: Interstitial lung disease with exacerbation  History of Present Illness:   63yo M never smoker from [place] with history of GERD, TIA, SAH s/p aneurysm, cirrhosis.  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  Currently on immunosuppression with prednisone 10 mg, rituximab and antifibrotic therapy with pirfenidone.  Was initially on CellCept Has progressive lung disease with recent hospitalization end of January 2025 for acute on chronic hypoxic respiratory failure was treated with IV Solu-Medrol, nebs, antibiotics.  Seen in pulmonary clinic today with sats in the 40s and sent to the ED for admission and further management.  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   Significant Hospital Events: Including procedures, antibiotic start and stop dates in addition to other pertinent events     Interim History / Subjective:    Objective   Blood pressure 126/85, pulse 100, temperature 97.6 F (36.4 C), temperature source Axillary, resp. rate (!) 30, height 5\' 8"  (1.727 m), weight 72.6 kg, SpO2 100%.       No intake or output data in the 24 hours ending 03/05/23 1515 Filed Weights   03/05/23 1128  Weight: 72.6 kg    Examination: Gen:      Moderate distress HEENT:  EOMI, sclera anicteric Neck:     No masses; no  thyromegaly Lungs:    Bilateral crackles CV:         Regular rate and rhythm; no murmurs Abd:      Distended abdomen Ext:    No edema; adequate peripheral perfusion Skin:      Warm and dry; no rash Neuro: alert and oriented x 3  Lab/imaging reviewed Significant for BUN/creatinine 24/0.74, alk phos 477 AST 91, ALT 81 WBC 7.4, hemoglobin 9.8, platelets 115 Negative flu, RSV, COVID X-ray with worsening interstitial airspace disease, opacities  Resolved Hospital Problem list     Assessment & Plan:  Interstitial lung disease, diffuse progressive pulmonary fibrosis with recurrent exacerbation On treatment with pirfenidone, rituximab, prednisone as outpatient CT angiogram Start antibiotic coverage with Vanco, cefepime given recent hospitalization Check complete respiratory virus panel, procalcitonin Pulse dose steroids Lasix 40 mg x 1 for volume overload Discussed case with Dr. Marchelle Gearing, primary pulmonologist  Goals of care Discussed goals of care with patient and family.  They want him to be DNR with no CPR or intubation Will ask palliative care to see him again as prognosis for recovery is poor.  Best Practice (right click and "Reselect all SmartList Selections" daily)   Diet/type: NPO DVT prophylaxis prophylactic heparin  Pressure ulcer(s): N/A GI prophylaxis: PPI Lines: N/A Foley:  N/A Code Status:  DNR Last date of multidisciplinary goals of care discussion []   Signature:   Chilton Greathouse MD [facility] See Amion for pager  If no response to pager , please call [phone] until 7pm After 7:00 pm call Elink  [phone] 03/05/2023, 4:09 PM

## 2023-03-05 NOTE — ED Notes (Signed)
02 sat 83% on 6L via [place], patient tachypnic. RT called to assess. Patient placed on high flow

## 2023-03-05 NOTE — ED Triage Notes (Signed)
Pt via [facility] from dr office. Hx pulmonary fibrosis with baseline O2 requirement, went to see dr and was found to be hypoxic. Per EMS, pt O2 saturation was 46% on 8-10L, improved to 100% on 15L NRB.   BP 130/90 RR 28 HR 110 EtCO2 24 O2 sat 100% on NRB  Pt is a/o x 4 and responding well at time of arrival to ED. Denies pain.

## 2023-03-05 NOTE — Progress Notes (Signed)
Pharmacy Antibiotic Note  Richard Davenport is a 63 y.o. male admitted on 03/05/2023 with sepsis.  Pharmacy has been consulted for vancomycin and cefepime dosing.  Recently admitted in 02/15/23 for shortness of breath. Received 5 days of azithromycin and ceftriaxone x1 with previous admission. WBC 7.4, temp 97.69F, Scr 0.74 (CrCl ~95 mL/min)   Plan: Give vancomycin 1500 mg load x1 Vancomycin 1000 mg IV q12h (Scr 0.8, Vd 0.72, eAUC 459)) Cefepime 2g IV q8h Monitor clinical status, renal function, and culture data   Height: 5\' 8"  (172.7 cm) Weight: 72.6 kg (160 lb) IBW/kg (Calculated) : 68.4  Temp (24hrs), Avg:97.6 F (36.4 C), Min:97.6 F (36.4 C), Max:97.6 F (36.4 C)  Recent Labs  Lab 03/05/23 1207  WBC 7.4  CREATININE 0.74    Estimated Creatinine Clearance: 95 mL/min (by C-G formula based on SCr of 0.74 mg/dL).    Allergies  Allergen Reactions   Pork-Derived Products Other (See Comments)    Patient is Muslim and PREFERS TO NOT TAKE ANY PORK OR MEAT PRODUCTS (only fish)    Antimicrobials this admission: Vancomycin 2/12 >>  Cefepime 2/12 >>   Dose adjustments this admission: N/a  Microbiology results: 2/12 BCx: sent   Thank you for allowing pharmacy to be a part of this patient's care.  Ernestene Kiel, PharmD PGY1 Pharmacy Resident  Please check AMION for all [facility] Pharmacy phone numbers After 10:00 PM, call Main Pharmacy [phone] 03/05/2023 4:15 PM

## 2023-03-06 DIAGNOSIS — Z515 Encounter for palliative care: Secondary | ICD-10-CM

## 2023-03-06 DIAGNOSIS — J849 Interstitial pulmonary disease, unspecified: Secondary | ICD-10-CM | POA: Diagnosis not present

## 2023-03-06 DIAGNOSIS — I671 Cerebral aneurysm, nonruptured: Secondary | ICD-10-CM

## 2023-03-06 DIAGNOSIS — J8410 Pulmonary fibrosis, unspecified: Secondary | ICD-10-CM | POA: Diagnosis not present

## 2023-03-06 DIAGNOSIS — J9621 Acute and chronic respiratory failure with hypoxia: Secondary | ICD-10-CM | POA: Diagnosis not present

## 2023-03-06 DIAGNOSIS — Z8679 Personal history of other diseases of the circulatory system: Secondary | ICD-10-CM

## 2023-03-06 DIAGNOSIS — Z7189 Other specified counseling: Secondary | ICD-10-CM

## 2023-03-06 DIAGNOSIS — J84112 Idiopathic pulmonary fibrosis: Secondary | ICD-10-CM | POA: Diagnosis not present

## 2023-03-06 DIAGNOSIS — R7989 Other specified abnormal findings of blood chemistry: Secondary | ICD-10-CM | POA: Diagnosis not present

## 2023-03-06 DIAGNOSIS — Z9889 Other specified postprocedural states: Secondary | ICD-10-CM

## 2023-03-06 DIAGNOSIS — E1165 Type 2 diabetes mellitus with hyperglycemia: Secondary | ICD-10-CM | POA: Diagnosis not present

## 2023-03-06 LAB — COMPREHENSIVE METABOLIC PANEL
ALT: 77 U/L — ABNORMAL HIGH (ref 0–44)
AST: 94 U/L — ABNORMAL HIGH (ref 15–41)
Albumin: <1.5 g/dL — ABNORMAL LOW (ref 3.5–5.0)
Alkaline Phosphatase: 435 U/L — ABNORMAL HIGH (ref 38–126)
Anion gap: 8 (ref 5–15)
BUN: 26 mg/dL — ABNORMAL HIGH (ref 6–20)
CO2: 27 mmol/L (ref 22–32)
Calcium: 7.8 mg/dL — ABNORMAL LOW (ref 8.9–10.3)
Chloride: 103 mmol/L (ref 98–111)
Creatinine, Ser: 0.9 mg/dL (ref 0.61–1.24)
GFR, Estimated: >60 mL/min (ref >60)
Glucose, Bld: 264 mg/dL — ABNORMAL HIGH (ref 70–99)
Potassium: 4.2 mmol/L (ref 3.5–5.1)
Sodium: 138 mmol/L (ref 135–145)
Total Bilirubin: 1.2 mg/dL (ref 0.0–1.2)
Total Protein: 7.3 g/dL (ref 6.5–8.1)

## 2023-03-06 LAB — CBG MONITORING, ED
Glucose-Capillary: 225 mg/dL — ABNORMAL HIGH (ref 70–99)
Glucose-Capillary: 242 mg/dL — ABNORMAL HIGH (ref 70–99)
Glucose-Capillary: 319 mg/dL — ABNORMAL HIGH (ref 70–99)

## 2023-03-06 LAB — RESPIRATORY PANEL BY PCR

## 2023-03-06 LAB — CBC
HCT: 25.6 % — ABNORMAL LOW (ref 39.0–52.0)
Hemoglobin: 7.8 g/dL — ABNORMAL LOW (ref 13.0–17.0)
MCH: 35.8 pg — ABNORMAL HIGH (ref 26.0–34.0)
MCHC: 30.5 g/dL (ref 30.0–36.0)
MCV: 117.4 fL — ABNORMAL HIGH (ref 80.0–100.0)
Platelets: 92 10*3/uL — ABNORMAL LOW (ref 150–400)
RBC: 2.18 MIL/uL — ABNORMAL LOW (ref 4.22–5.81)
RDW: 18.5 % — ABNORMAL HIGH (ref 11.5–15.5)
WBC: 5.9 10*3/uL (ref 4.0–10.5)
nRBC: 0 % (ref 0.0–0.2)

## 2023-03-06 LAB — GLUCOSE, CAPILLARY
Glucose-Capillary: 317 mg/dL — ABNORMAL HIGH (ref 70–99)
Glucose-Capillary: 207 mg/dL — ABNORMAL HIGH (ref 70–99)

## 2023-03-06 MED ORDER — FUROSEMIDE 40 MG PO TABS
40.0000 mg | ORAL_TABLET | Freq: Every day | ORAL | Status: DC
  Administered 2023-03-06 – 2023-03-10 (×5): 40 mg via ORAL
  Filled 2023-03-06 (×5): qty 1

## 2023-03-06 MED ORDER — INSULIN GLARGINE-YFGN 100 UNIT/ML ~~LOC~~ SOLN
15.0000 [IU] | Freq: Every day | SUBCUTANEOUS | Status: DC
  Administered 2023-03-06 – 2023-03-10 (×5): 15 [IU] via SUBCUTANEOUS
  Filled 2023-03-06 (×5): qty 0.15

## 2023-03-06 MED ORDER — BUDESONIDE 0.5 MG/2ML IN SUSP
0.5000 mg | Freq: Two times a day (BID) | RESPIRATORY_TRACT | Status: DC
  Administered 2023-03-06 – 2023-03-10 (×6): 0.5 mg via RESPIRATORY_TRACT
  Filled 2023-03-06 (×7): qty 2

## 2023-03-06 NOTE — Progress Notes (Signed)
NAME:  Richard Davenport, MRN:  [id number], DOB:  07-01-1960, LOS: 1 ADMISSION DATE:  03/05/2023, CONSULTATION DATE: 03/05/2023 REFERRING MD: Leslye Peer, MD, CHIEF COMPLAINT: Interstitial lung disease with exacerbation  History of Present Illness:   63yo M never smoker from [place] with history of GERD, TIA, SAH s/p aneurysm, cirrhosis.  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  Currently on immunosuppression with prednisone 10 mg, rituximab and antifibrotic therapy with pirfenidone.  Was initially on CellCept Has progressive lung disease with recent hospitalization end of January 2025 for acute on chronic hypoxic respiratory failure was treated with IV Solu-Medrol, nebs, antibiotics.  Seen in pulmonary clinic today with sats in the 40s and sent to the ED for admission and further management.  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   Significant Hospital Events: Including procedures, antibiotic start and stop dates in addition to other pertinent events   CTA chest 2/12>>> 1. No evidence of pulmonary embolus. 2. Extensive changes of fibrosis. More confluent opacities noted in the lower lobes, similar prior study which may also reflect chronic fibrotic changes, but difficult to completely exclude superimposed edema or infection. 3. Cardiomegaly. 4. Cirrhosis with upper abdominal ascites, stable.  Interim History / Subjective:  No acute change overnight. Remains in ER. Mild tachypnea but no  overt respiratory distress. Palliative care in with pt/family   Objective   Blood pressure (!) 129/97, pulse (!) 110, temperature (!) 97.5 F (36.4 C), temperature source Axillary, resp. rate (!) 35, height 5\' 8"  (1.727 m), weight 72.6 kg, SpO2 94%.        Intake/Output Summary (Last 24 hours) at 03/06/2023 1121 Last data filed at 03/06/2023 0010 Gross per 24 hour  Intake 433.67 ml  Output --  Net 433.67 ml   Filed Weights   03/05/23 1128  Weight: 72.6 kg    Examination: Gen:      Chronically ill appearing male, NAD in ER on HFNC 10L.  HEENT:  EOMI, sclera anicteric Lungs:    resps even non labored on HFNC, diminished throughout with scattered Bilateral crackles CV:         Regular rate and rhythm; no murmurs Abd:      Distended abdomen Ext:    No edema; adequate peripheral perfusion Skin:      Warm and dry; no rash Neuro: alert and oriented x 3   Resolved Hospital Problem list     Assessment & Plan:  Interstitial lung disease, diffuse progressive pulmonary fibrosis with recurrent exacerbation On treatment with pirfenidone, rituximab, prednisone as outpatient P:  Continue abx - Vanco, cefepime given recent hospitalization RVP neg  Continue IV steroids   Goals of care Poor prognosis overall with progression of ILD. Discussed at length at bedside with pt, family and palliative care NP. DNR/DNI confirmed. Pt agreeable to short admission for IV abx, IV steroids 24-48hrs. Hopefully this time will also allow palliative care to arrange hospice assistance at home to focus on comfort. Seems to be improving slowly, but if declines would transition to comfort.    Best Practice (right click and "Reselect all SmartList Selections" daily)  Diet/type: NPO DVT prophylaxis prophylactic heparin  Pressure ulcer(s): N/A GI prophylaxis: PPI Lines: N/A Foley:  N/A Code Status:  DNR Last date of multidisciplinary goals of care discussion []   Signature:   Dirk Dress,  NP Pulmonary/Critical Care Medicine  03/06/2023  11:21 AM   See Loretha Stapler for personal pager PCCM on call pager [phone] until 7pm. Please call Elink 7p-7a. [phone]

## 2023-03-06 NOTE — Consult Note (Signed)
Consultation Note Date: 03/06/2023   Patient Name: Richard Davenport  DOB: April 04, 1960  MRN: [id number]  Age / Sex: 63 y.o., male  PCP: Richard Shan, MD Referring Physician: Almon Hercules, MD  Reason for Consultation: Establishing goals of care  HPI/Patient Profile: 63 y.o. male  with past medical history of hypertension, hyperlipidemia, diabetes, TIA/CVA, cerebral aneurysm s/p coil, AKI, cirrhosis family history NASH), ILD chronic 5L oxygen with recent increase to 8-10L admitted on 03/05/2023 with worsening shortness of breath due to worsening ILD. Richard Davenport has educated about poor prognosis and given recommendation to consider hospice. DNR/DNI status has been established.   Clinical Assessment and Goals of Care: Consult received and chart review completed. Reviewed Richard Davenport note 2/12 and recent inpatient palliative consult note on 1/23. I met today with Richard Davenport along with 3 of 4 of his children. Very kind and warm patient and family. We reviewed his clinical status and CT results with evidence of progressing fibrosis. We reviewed plan of care with oxygen, nebs, steroids, and antibiotics. We reviewed plans for optimizing his medical care with these measures but concern that he will continue to have worsening status with increased symptom burden and decreased activity tolerance. We reviewed options to alleviate symptoms with medications that would not fix the problem but provide him with a level of comfort and relief. We discussed the best resource to assist with managing symptoms and to help support him and family at home would be from hospice. We discussed hospice support and what this looks like. We discussed overall poor prognosis. I acknowledged that this is a big change for him and a lot to process. I gave family some privacy to discuss and support each other.   Update: I returned to bedside when  Richard Davenport wife arrived and daughter still at bedside. We reviewed conversation above with wife. They live in [facility] and would connect with [facility] when/if hospice is elected. They would like some more time to think about this step. Daughter asks good and insightful questions. Family report good support from friends and faith community. They asked about resources to assist them for wife to be able to take time off work to be with him and care for him - unfortunately there are not many resources available for this purpose but I encouraged them to reach out to their faith community.   We did discuss that if hospice elected we can typically arrange this with equipment delivery within 24 hours from the hospital. We discussed option of hospice facility if symptom burden difficult to manage at home as an alternative to returning to the hospital. They asked if he would be allowed to return to the hospital if desired and I reassured them that this will always be their ultimate decision to make with or without hospice. However, a goal of hospice is to help support people at home or their facility to prevent them from having to come back to the hospital.   All questions/concerns addressed. Emotional support  provided.   Primary Decision Maker PATIENT    SUMMARY OF RECOMMENDATIONS   - DNR/DNI - Considering home with hospice ([facility])  Code Status/Advance Care Planning: DNR   Symptom Management:  Shortness of breath: Optimize per PCCM recommendations. Would recommend roxanol SL/PO 5 mg q2h PRN to begin (very likely to need active titration of dosage with worsening dyspnea - to be managed by hospice if elected).   Prognosis:  High risk for acute decompensation.   Discharge Planning: To Be Determined      Primary Diagnoses: Present on Admission:  Hyperlipidemia  Type 2 diabetes mellitus with hyperglycemia (HCC)  Elevated LFTs  Cirrhosis (HCC)  Lung disease,  interstitial (HCC)  Cerebral aneurysm, nonruptured  Essential hypertension  Acute on chronic respiratory failure with hypoxia (HCC)   I have reviewed the medical record, interviewed the patient and family, and examined the patient. The following aspects are pertinent.  Past Medical History:  Diagnosis Date   AKI (acute kidney injury) (HCC) 08/27/2013   Altered mental status 10/09/2012   Asthma    Diabetes mellitus without complication (HCC)    Headache    Idiopathic pulmonary fibrosis (HCC)    Right thalamic infarction [facility]) s/p tPA 04/10/2018   Stroke (HCC)    Subarachnoid hemorrhage (HCC)    TIA (transient ischemic attack) 08/26/2013   Social History   Socioeconomic History   Marital status: Married    Spouse name: Richard Davenport   Number of children: 4   Years of education: 12   Highest education level: Not on file  Occupational History   Not on file  Tobacco Use   Smoking status: Never    Passive exposure: Current   Smokeless tobacco: Never  Vaping Use   Vaping status: Never Used  Substance and Sexual Activity   Alcohol use: No   Drug use: No   Sexual activity: Not Currently  Other Topics Concern   Not on file  Social History Narrative   Still working [place], 4 children, watches TV   Social Drivers of Health   Financial Resource Strain: Low Risk  (05/24/2022)   Received from [facility], [facility]   Overall Financial Resource Strain (CARDIA)    Difficulty of Paying Living Expenses: Not hard at all  Food Insecurity: No Food Insecurity (02/12/2023)   Hunger Vital Sign    Worried About Running Out of Food in the Last Year: Never true    Ran Out of Food in the Last Year: Never true  Transportation Needs: No Transportation Needs (02/12/2023)   PRAPARE - Administrator, Civil Service (Medical): No    Lack of Transportation (Non-Medical): No  Physical Activity: Not on File (05/10/2021)   Received from [place],  [place]   Physical Activity    Physical Activity: 0  Stress: Not on File (05/10/2021)   Received from [facility], [place]   Stress    Stress: 0  Social Connections: Not on File (10/05/2022)   Received from [facility]   Social Connections    Connectedness: 0   Family History  Problem Relation Age of Onset   Hypertension Mother    Hypertension Father    Scheduled Meds:  budesonide  0.5 mg Nebulization QID   gabapentin  300 mg Oral TID   insulin aspart  0-20 Units Subcutaneous Q4H   ipratropium-albuterol  3 mL Nebulization Q6H WA   sodium chloride flush  3 mL Intravenous Q12H   spironolactone  50 mg Oral Daily   Continuous Infusions:  ceFEPime (MAXIPIME) IV Stopped (03/06/23 0349)   methylPREDNISolone (SOLU-MEDROL) injection Stopped (03/06/23 0708)   vancomycin Stopped (03/06/23 0708)   PRN Meds:.acetaminophen **OR** acetaminophen, albuterol, polyethylene glycol Allergies  Allergen Reactions   Pork-Derived Products Other (See Comments)    Patient is Muslim and PREFERS TO NOT TAKE ANY PORK OR MEAT PRODUCTS (only fish)   Review of Systems  Constitutional:  Positive for activity change and fatigue.  Respiratory:  Positive for shortness of breath.     Physical Exam Vitals and nursing note reviewed.  Constitutional:      General: He is not in acute distress.    Appearance: He is ill-appearing.  Cardiovascular:     Rate and Rhythm: Tachycardia present.  Pulmonary:     Effort: No tachypnea, accessory muscle usage or respiratory distress.     Comments: Sats good and breathing stable at rest Abdominal:     General: Abdomen is flat.  Neurological:     Mental Status: He is alert and oriented to person, place, and time.     Vital Signs: BP 113/81   Pulse (!) 107   Temp (!) 97.5 F (36.4 C) (Axillary)   Resp (!) 30   Ht 5\' 8"  (1.727 m)   Wt 72.6 kg   SpO2 100%   BMI 24.33 kg/m  Pain Scale: 0-10   Pain Score: 0-No pain   SpO2: SpO2: 100 % O2 Device:SpO2: 100 % O2 Flow Rate:  .O2 Flow Rate (L/min): 9 L/min  IO: Intake/output summary:  Intake/Output Summary (Last 24 hours) at 03/06/2023 0848 Last data filed at 03/06/2023 0010 Gross per 24 hour  Intake 433.67 ml  Output --  Net 433.67 ml    LBM:   Baseline Weight: Weight: 72.6 kg Most recent weight: Weight: 72.6 kg     Palliative Assessment/Data:    Time Total: 75 min  Greater than 50%  of this time was spent counseling and coordinating care related to the above assessment and plan.  Signed by: Yong Channel, NP Palliative Medicine Team Pager # [phone] (M-F 8a-5p) Team Phone # [phone] (Nights/Weekends)

## 2023-03-06 NOTE — Progress Notes (Signed)
PROGRESS NOTE  Richard Davenport JXB:[id number] DOB: 23-Nov-1960   PCP: Kalman Shan, MD  Patient is from: Home  DOA: 03/05/2023 LOS: 1  Chief complaints Chief Complaint  Patient presents with   Shortness of Breath     Brief Narrative / Interim history: 63 year old M with PMH of pulmonary fibrosis/ILD/chronic hypoxic RF on 8 to 10 L, liver cirrhosis, TIA, CVA, cerebral aneurysm s/p coil, DM-2, HTN and HLD sent to ED from pulmonary office due to shortness of breath and hypoxemia to 40% on home 8 to 10 L, and admitted with acute on chronic respiratory failure likely due to progressive pulmonary fibrosis with recurrent exacerbation.  CT angio chest negative for PE but extensive changes of fibrosis, cardiomegaly and cirrhosis.  Viral respiratory panel negative.  Pulmonology consulted.  Patient was started on broad-spectrum antibiotics, steroid and diuretics.  He is DNR/DNI.  Palliative medicine consulted.   Subjective: Seen and examined earlier this morning.  No major events overnight of this morning.  Has no complaints but feels very short of breath when he sat up in bed for lung exam.  Denies chest pain, cough, GI or UTI symptoms.  He was saturating at 100% on 9 L but desaturated to 90% with minimal exertion.  Objective: Vitals:   03/06/23 0600 03/06/23 0700 03/06/23 0724 03/06/23 1000  BP: 117/80 113/81  (!) 129/97  Pulse: (!) 102 (!) 108 (!) 107 (!) 110  Resp: (!) 26 (!) 32 (!) 30 (!) 35  Temp:   (!) 97.5 F (36.4 C)   TempSrc:   Axillary   SpO2: 100% 99% 100% 94%  Weight:      Height:        Examination:  GENERAL: No apparent distress.  Nontoxic. HEENT: MMM.  Vision and hearing grossly intact.  NECK: Supple.  No apparent JVD.  RESP: Significant WOB with minimal exertion.  Fair aeration bilaterally.  Crackles bilaterally.  CVS:  RRR. Heart sounds normal.  ABD/GI/GU: BS+. Abd soft, NTND.  MSK/EXT:  Moves extremities. No apparent deformity. No edema.  SKIN: no apparent  skin lesion or wound NEURO: Awake, alert and oriented appropriately.  No apparent focal neuro deficit. PSYCH: Calm. Normal affect.   Procedures:  None  Microbiology summarized: COVID-19, influenza and RSV PCR nonreactive 20 pathogen RVP negative Blood cultures NGTD  Assessment and plan: Acute on chronic respiratory failure with hypoxia due to progressive pulmonary fibrosis/ILD: Sent to ED from pulmonology office due to shortness of breath and hypoxemia to 40% on home 8 to 10 L.  Required up to 14 L with NRB but weaned to 8 L by [place].  Currently saturating in upper 90s to 100 on 9 L at rest but easily desaturates with minimal exertion.  Also significant work of breathing with minimal exertion.  CT angio chest as above. On treatment with pirfenidone, rituximab, prednisone as outpatient  -Appreciate help by pulmonology -Continue broad-spectrum antibiotics, steroid and breathing treatments. -Check MRSA PCR screen. -Palliative medicine to see patient    Liver cirrhosis/elevated liver enzymes: Looks compensated.  Mild elevated liver enzymes.  Family history of NASH.  Received IV Lasix yesterday.  Creatinine slightly up -Continue home Lasix and Aldactone.  Hypertension: Blood pressure within acceptable range. -Continue home Lasix and spironolactone   NIDDM-2 with hyperglycemia: A1c 4.9%.  Hyperglycemia likely due to steroid Recent Labs  Lab 03/05/23 1641 03/05/23 2227 03/06/23 0009 03/06/23 0349 03/06/23 0953  GLUCAP 172* 287* 319* 242* 225*  -Add Semglee 15 units daily -Continue SSI-resistant  Hypergammaglobulinemia/inflammatory dermatosis: Followed by allergy and immunology outpatient. -Outpatient follow-up.  History of TIA/CVA/cerebral aneurysm s/p coil. - Noted  Anemia of chronic disease: Hgb 7.8 (baseline ranges from 8-9).  No overt bleeding. 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  Relatively stable -Continue monitoring   Goals of care-DNR/DNI  Body mass index is 24.33 kg/m.          DVT prophylaxis:  SCDs Start: 03/05/23 1427  Code Status: DNR/DNI Family Communication: Updated patient's daughters, and son at bedside. Level of care: Progressive Status is: Inpatient Remains inpatient appropriate because: Acute on chronic respiratory failure with hypoxia due to ILD flare   Final disposition: To be determined Consultants:  PCCM Palliative medicine  55 minutes with more than 50% spent in reviewing records, counseling patient/family and coordinating care.   Sch Meds:  Scheduled Meds:  budesonide  0.5 mg Nebulization QID   gabapentin  300 mg Oral TID   insulin aspart  0-20 Units Subcutaneous Q4H   ipratropium-albuterol  3 mL Nebulization Q6H WA   sodium chloride flush  3 mL Intravenous Q12H   spironolactone  50 mg Oral Daily   Continuous Infusions:  ceFEPime (MAXIPIME) IV Stopped (03/06/23 1022)   methylPREDNISolone (SOLU-MEDROL) injection Stopped (03/06/23 0708)   vancomycin Stopped (03/06/23 0708)   PRN Meds:.acetaminophen **OR** acetaminophen, albuterol, polyethylene glycol  Antimicrobials: Anti-infectives (From admission, onward)    Start     Dose/Rate Route Frequency Ordered Stop   03/06/23 0500  vancomycin (VANCOCIN) IVPB 1000 mg/200 mL premix        1,000 mg 200 mL/hr over 60 Minutes Intravenous Every 12 hours 03/05/23 1632     03/05/23 1700  ceFEPIme (MAXIPIME) 2 g in sodium chloride 0.9 % 100 mL IVPB        2 g 200 mL/hr over 30 Minutes Intravenous Every 8 hours 03/05/23 1632     03/05/23 1630  vancomycin (VANCOREADY) IVPB 1500 mg/300 mL        1,500 mg 150 mL/hr over 120 Minutes Intravenous  Once 03/05/23 1632 03/06/23 0010        I have personally  reviewed the following labs and images: CBC: Recent Labs  Lab 03/05/23 1207 03/05/23 1217 03/06/23 0347  WBC 7.4  --  5.9  NEUTROABS 6.9  --   --   HGB 9.8* 10.9* 7.8*  HCT 31.4* 32.0* 25.6*  MCV 116.7*  --  117.4*  PLT 115*  --  92*   BMP &GFR Recent Labs  Lab 03/05/23 1207 03/05/23 1217 03/06/23 0347  NA 138 144 138  K 4.2 4.3 4.2  CL 102  --  103  CO2 30  --  27  GLUCOSE 140*  --  264*  BUN 24*  --  26*  CREATININE 0.74  --  0.90  CALCIUM 8.0*  --  7.8*   Estimated Creatinine Clearance: 84.4 mL/min (by C-G formula based on SCr of 0.9 mg/dL). Liver & Pancreas: Recent Labs  Lab 03/05/23 1207 03/06/23 0347  AST 91* 94*  ALT 81* 77*  ALKPHOS 477* 435*  BILITOT 1.7* 1.2  PROT 8.1 7.3  ALBUMIN <1.5* <1.5*   No results for input(s): "LIPASE", "AMYLASE" in the last 168 hours. No results for input(s): "AMMONIA" in  the last 168 hours. Diabetic: No results for input(s): "HGBA1C" in the last 72 hours. Recent Labs  Lab 03/05/23 1641 03/05/23 2227 03/06/23 0009 03/06/23 0349 03/06/23 0953  GLUCAP 172* 287* 319* 242* 225*   Cardiac Enzymes: No results for input(s): "CKTOTAL", "CKMB", "CKMBINDEX", "TROPONINI" in the last 168 hours. Recent Labs    11/05/22 1434  PROBNP 111.0*   Coagulation Profile: No results for input(s): "INR", "PROTIME" in the last 168 hours. Thyroid Function Tests: No results for input(s): "TSH", "T4TOTAL", "FREET4", "T3FREE", "THYROIDAB" in the last 72 hours. Lipid Profile: No results for input(s): "CHOL", "HDL", "LDLCALC", "TRIG", "CHOLHDL", "LDLDIRECT" in the last 72 hours. Anemia Panel: No results for input(s): "VITAMINB12", "FOLATE", "FERRITIN", "TIBC", "IRON", "RETICCTPCT" in the last 72 hours. Urine analysis:    Component Value Date/Time   COLORURINE AMBER (A) 12/19/2022 1604   APPEARANCEUR CLEAR 12/19/2022 1604   LABSPEC 1.021 12/19/2022 1604   PHURINE 6.0 12/19/2022 1604   GLUCOSEU NEGATIVE 12/19/2022 1604   HGBUR SMALL (A)  12/19/2022 1604   BILIRUBINUR NEGATIVE 12/19/2022 1604   KETONESUR NEGATIVE 12/19/2022 1604   PROTEINUR NEGATIVE 12/19/2022 1604   UROBILINOGEN 0.2 05/27/2014 1230   NITRITE NEGATIVE 12/19/2022 1604   LEUKOCYTESUR NEGATIVE 12/19/2022 1604   Sepsis Labs: Invalid input(s): "PROCALCITONIN", "LACTICIDVEN"  Microbiology: Recent Results (from the past 240 hours)  Resp panel by RT-PCR (RSV, Flu A&B, Covid) Anterior Nasal Swab     Status: None   Collection Time: 03/05/23 11:40 AM   Specimen: Anterior Nasal Swab  Result Value Ref Range Status   SARS Coronavirus 2 by RT PCR NEGATIVE NEGATIVE Final   Influenza A by PCR NEGATIVE NEGATIVE Final   Influenza B by PCR NEGATIVE NEGATIVE Final    Comment: (NOTE) The Xpert Xpress SARS-CoV-2/FLU/RSV plus assay is intended as an aid in the diagnosis of influenza from Nasopharyngeal swab specimens and should not be used as a sole basis for treatment. Nasal washings and aspirates are unacceptable for Xpert Xpress SARS-CoV-2/FLU/RSV testing.  Fact Sheet for Patients: BloggerCourse.com  Fact Sheet for Healthcare Providers: SeriousBroker.it  This test is not yet approved or cleared by the [place] FDA and has been authorized for detection and/or diagnosis of SARS-CoV-2 by FDA under an Emergency Use Authorization (EUA). This EUA will remain in effect (meaning this test can be used) for the duration of the COVID-19 declaration under Section 564(b)(1) of the Act, 21 U.S.C. section 360bbb-3(b)(1), unless the authorization is terminated or revoked.     Resp Syncytial Virus by PCR NEGATIVE NEGATIVE Final    Comment: (NOTE) Fact Sheet for Patients: BloggerCourse.com  Fact Sheet for Healthcare Providers: SeriousBroker.it  This test is not yet approved or cleared by the [place] FDA and has been authorized for detection and/or diagnosis of  SARS-CoV-2 by FDA under an Emergency Use Authorization (EUA). This EUA will remain in effect (meaning this test can be used) for the duration of the COVID-19 declaration under Section 564(b)(1) of the Act, 21 U.S.C. section 360bbb-3(b)(1), unless the authorization is terminated or revoked.  Performed at [facility], [address] [place], [place] [address]   Respiratory (~20 pathogens) panel by PCR     Status: None   Collection Time: 03/05/23  9:52 PM   Specimen: Nasopharyngeal Swab; Respiratory  Result Value Ref Range Status   Adenovirus NOT DETECTED NOT DETECTED Final   Coronavirus 229E NOT DETECTED NOT DETECTED Final    Comment: (NOTE) The Coronavirus on the Respiratory Panel, DOES NOT test  for the novel  Coronavirus (2019 nCoV)    Coronavirus HKU1 NOT DETECTED NOT DETECTED Final   Coronavirus NL63 NOT DETECTED NOT DETECTED Final   Coronavirus OC43 NOT DETECTED NOT DETECTED Final   Metapneumovirus NOT DETECTED NOT DETECTED Final   Rhinovirus / Enterovirus NOT DETECTED NOT DETECTED Final   Influenza A NOT DETECTED NOT DETECTED Final   Influenza B NOT DETECTED NOT DETECTED Final   Parainfluenza Virus 1 NOT DETECTED NOT DETECTED Final   Parainfluenza Virus 2 NOT DETECTED NOT DETECTED Final   Parainfluenza Virus 3 NOT DETECTED NOT DETECTED Final   Parainfluenza Virus 4 NOT DETECTED NOT DETECTED Final   Respiratory Syncytial Virus NOT DETECTED NOT DETECTED Final   Bordetella pertussis NOT DETECTED NOT DETECTED Final   Bordetella Parapertussis NOT DETECTED NOT DETECTED Final   Chlamydophila pneumoniae NOT DETECTED NOT DETECTED Final   Mycoplasma pneumoniae NOT DETECTED NOT DETECTED Final    Comment: Performed at [facility], [address] [place], [place] [address]  Culture, blood (Routine X 2) w Reflex to ID Panel     Status: None (Preliminary result)   Collection Time: 03/05/23  9:53 PM   Specimen: BLOOD LEFT ARM  Result Value Ref Range Status   Specimen  Description BLOOD LEFT ARM  Final   Special Requests   Final    BOTTLES DRAWN AEROBIC AND ANAEROBIC Blood Culture adequate volume   Culture   Final    NO GROWTH < 12 HOURS Performed at [facility], [address] [place], [place] [address]    Report Status PENDING  Incomplete    Radiology Studies: CT Angio Chest Pulmonary Embolism (PE) W or WO Contrast Result Date: 03/05/2023 CLINICAL DATA:  Pulmonary embolism (PE) suspected, high prob. Hypoxia. History of fibrosis. EXAM: CT ANGIOGRAPHY CHEST WITH CONTRAST TECHNIQUE: Multidetector CT imaging of the chest was performed using the standard protocol during bolus administration of intravenous contrast. Multiplanar CT image reconstructions and MIPs were obtained to evaluate the vascular anatomy. RADIATION DOSE REDUCTION: This exam was performed according to the departmental dose-optimization program which includes automated exposure control, adjustment of the mA and/or kV according to patient size and/or use of iterative reconstruction technique. CONTRAST:  75mL OMNIPAQUE IOHEXOL 350 MG/ML SOLN COMPARISON:  02/11/2023 FINDINGS: Cardiovascular: No filling defects in the pulmonary arteries to suggest pulmonary emboli. Heart is mildly enlarged. Aorta normal caliber. Mediastinum/Nodes: No mediastinal, hilar, or axillary adenopathy. Trachea and esophagus are unremarkable. Thyroid unremarkable. Lungs/Pleura: Extensive bilateral changes of fibrosis with interstitial thickening and ground-glass opacities. Findings are stable since prior study. More confluent airspace opacities noted in the lower lobes, also similar to prior study. This also could be related to fibrosis although it is difficult to completely exclude superimposed edema or infection. No effusions. Upper Abdomen: Cirrhosis. Large volume ascites in the upper abdomen, similar to prior study. Musculoskeletal: Chest wall soft tissues are unremarkable. No acute bony abnormality. Review of the MIP  images confirms the above findings. IMPRESSION: 1. No evidence of pulmonary embolus. 2. Extensive changes of fibrosis. More confluent opacities noted in the lower lobes, similar prior study which may also reflect chronic fibrotic changes, but difficult to completely exclude superimposed edema or infection. 3. Cardiomegaly. 4. Cirrhosis with upper abdominal ascites, stable. Electronically Signed   By: Charlett Nose M.D.   On: 03/05/2023 18:48   DG Chest Port 1 View Result Date: 03/05/2023 CLINICAL DATA:  Shortness of breath.  History of pulmonary fibrosis. EXAM: PORTABLE CHEST 1 VIEW  COMPARISON:  Chest radiograph dated 02/15/2023. FINDINGS: Low lung volumes. Stable enlargement of the cardiac silhouette. Diffuse bilateral interstitial changes are again noted compatible with history of pulmonary fibrosis. There is asymmetric increased interstitial density within the left lung. Similar suspected small right pleural effusion. No acute osseous abnormality. IMPRESSION: 1. Low lung volumes. Diffuse bilateral interstitial changes compatible with history of pulmonary fibrosis. Increased interstitial opacification within the left lung is concerning for superimposed asymmetric edema or an infectious/inflammatory etiology. 2. Similar suspected small right pleural effusion. Electronically Signed   By: Hart Robinsons M.D.   On: 03/05/2023 14:08      Daymien Goth T. Jaonna Word [facility]  If 7PM-7AM, please contact night-coverage [email or web address] 03/06/2023, 11:04 AM

## 2023-03-06 NOTE — Progress Notes (Signed)
   03/06/23 1402  Vitals  Temp 98.4 F (36.9 C)  Temp Source Oral  BP (!) 130/95  MAP (mmHg) 105  BP Location Left Arm  BP Method Automatic  Patient Position (if appropriate) Lying  ECG Heart Rate (!) 110  Resp (!) 31  Oxygen Therapy  SpO2 95 %  O2 Device HFNC  O2 Flow Rate (L/min) 9 L/min   Patient arrived from ED, patient placed on monitor and CCMD made aware, patient on HFNC at 9L, bedside RN aware. Cerys Winget, Randall An RN

## 2023-03-07 DIAGNOSIS — J8410 Pulmonary fibrosis, unspecified: Secondary | ICD-10-CM | POA: Diagnosis not present

## 2023-03-07 DIAGNOSIS — Z66 Do not resuscitate: Secondary | ICD-10-CM

## 2023-03-07 DIAGNOSIS — J849 Interstitial pulmonary disease, unspecified: Secondary | ICD-10-CM | POA: Diagnosis not present

## 2023-03-07 DIAGNOSIS — Z7189 Other specified counseling: Secondary | ICD-10-CM | POA: Diagnosis not present

## 2023-03-07 DIAGNOSIS — J9621 Acute and chronic respiratory failure with hypoxia: Secondary | ICD-10-CM | POA: Diagnosis not present

## 2023-03-07 DIAGNOSIS — Z515 Encounter for palliative care: Secondary | ICD-10-CM | POA: Diagnosis not present

## 2023-03-07 LAB — CBC
HCT: 25.8 % — ABNORMAL LOW (ref 39.0–52.0)
Hemoglobin: 8.2 g/dL — ABNORMAL LOW (ref 13.0–17.0)
MCH: 36.1 pg — ABNORMAL HIGH (ref 26.0–34.0)
MCHC: 31.8 g/dL (ref 30.0–36.0)
MCV: 113.7 fL — ABNORMAL HIGH (ref 80.0–100.0)
Platelets: 103 10*3/uL — ABNORMAL LOW (ref 150–400)
RBC: 2.27 MIL/uL — ABNORMAL LOW (ref 4.22–5.81)
RDW: 18.4 % — ABNORMAL HIGH (ref 11.5–15.5)
WBC: 9.9 10*3/uL (ref 4.0–10.5)
nRBC: 0 % (ref 0.0–0.2)

## 2023-03-07 LAB — COMPREHENSIVE METABOLIC PANEL
ALT: 75 U/L — ABNORMAL HIGH (ref 0–44)
AST: 87 U/L — ABNORMAL HIGH (ref 15–41)
Albumin: <1.5 g/dL — ABNORMAL LOW (ref 3.5–5.0)
Alkaline Phosphatase: 367 U/L — ABNORMAL HIGH (ref 38–126)
Anion gap: 6 (ref 5–15)
BUN: 29 mg/dL — ABNORMAL HIGH (ref 6–20)
CO2: 28 mmol/L (ref 22–32)
Calcium: 7.7 mg/dL — ABNORMAL LOW (ref 8.9–10.3)
Chloride: 101 mmol/L (ref 98–111)
Creatinine, Ser: 1.01 mg/dL (ref 0.61–1.24)
GFR, Estimated: >60 mL/min (ref >60)
Glucose, Bld: 220 mg/dL — ABNORMAL HIGH (ref 70–99)
Potassium: 3.8 mmol/L (ref 3.5–5.1)
Sodium: 135 mmol/L (ref 135–145)
Total Bilirubin: 1.4 mg/dL — ABNORMAL HIGH (ref 0.0–1.2)
Total Protein: 7.3 g/dL (ref 6.5–8.1)

## 2023-03-07 LAB — GLUCOSE, CAPILLARY
Glucose-Capillary: 112 mg/dL — ABNORMAL HIGH (ref 70–99)
Glucose-Capillary: 177 mg/dL — ABNORMAL HIGH (ref 70–99)
Glucose-Capillary: 187 mg/dL — ABNORMAL HIGH (ref 70–99)
Glucose-Capillary: 207 mg/dL — ABNORMAL HIGH (ref 70–99)
Glucose-Capillary: 233 mg/dL — ABNORMAL HIGH (ref 70–99)
Glucose-Capillary: 246 mg/dL — ABNORMAL HIGH (ref 70–99)

## 2023-03-07 LAB — PHOSPHORUS: Phosphorus: 2.5 mg/dL (ref 2.5–4.6)

## 2023-03-07 LAB — PROTIME-INR
INR: 1.9 — ABNORMAL HIGH (ref 0.8–1.2)
Prothrombin Time: 21.6 s — ABNORMAL HIGH (ref 11.4–15.2)

## 2023-03-07 LAB — MRSA NEXT GEN BY PCR, NASAL: MRSA by PCR Next Gen: NOT DETECTED

## 2023-03-07 LAB — MAGNESIUM: Magnesium: 1.8 mg/dL (ref 1.7–2.4)

## 2023-03-07 MED ORDER — PREDNISONE 20 MG PO TABS
60.0000 mg | ORAL_TABLET | Freq: Every day | ORAL | Status: DC
  Administered 2023-03-08 – 2023-03-10 (×3): 60 mg via ORAL
  Filled 2023-03-07 (×3): qty 3

## 2023-03-07 MED ORDER — PIRFENIDONE 267 MG PO TABS
534.0000 mg | ORAL_TABLET | Freq: Three times a day (TID) | ORAL | Status: DC
  Administered 2023-03-07 – 2023-03-10 (×9): 534 mg via ORAL
  Filled 2023-03-07 (×10): qty 2

## 2023-03-07 NOTE — Evaluation (Signed)
Physical Therapy Evaluation Patient Details Name: Richard Davenport MRN: [id number] DOB: [birth year] Today's Date: 03/07/2023  History of Present Illness  Pt is 63 yo presenting to [facility] ED on 2/12 due to shortness of breathe and hypoxemia of 40% on 8-10 L. Admitted with acute on chronic respiratory failure related to progressive pulmonary fibrosis with recurrent exacerbation. Hx of SAH s/p aneurysm coiling, hepatic cirrhosis, HLD, anemia of chronic disease, chronic thrombocytopenia  Clinical Impression  Pt is presenting close to baseline level of functioning. Pt may benefit from simple mask for O2 due to pt tends to breathe through the mouth during mobility which significantly decreases O2 sats requiring frequent verbal cues for purse lipped breathing to facilitate breathing through the nose to receive O2. Pt has supportive family available 24/7. Due to pt current functional status, home set up and available assistance at home recommending skilled physical therapy services 3x/week in order to address strength, balance and functional mobility to decrease risk for falls, injury and re-hospitalization.           If plan is discharge home, recommend the following: Assistance with cooking/housework;Assist for transportation;Help with stairs or ramp for entrance;A little help with walking and/or transfers     Equipment Recommendations None recommended by PT     Functional Status Assessment Patient has had a recent decline in their functional status and demonstrates the ability to make significant improvements in function in a reasonable and predictable amount of time.     Precautions / Restrictions Precautions Precautions: Fall;Other (comment) Precaution/Restrictions Comments: monitor O2 Restrictions Weight Bearing Restrictions Per Provider Order: No      Mobility  Bed Mobility Overal bed mobility: Needs Assistance Bed Mobility: Supine to Sit, Sit to Supine     Supine to sit:  Supervision Sit to supine: Supervision   General bed mobility comments: supervision due to O2 sats    Transfers Overall transfer level: Needs assistance Equipment used: None Transfers: Sit to/from Stand Sit to Stand: Contact guard assist           General transfer comment: CGA due to O2 sats . verbal cues for pused lipped breathing.    Ambulation/Gait   Pre-gait activities: took side steps at EOB. Good balance. Deferred due to O2 sats       Balance Overall balance assessment: Mild deficits observed, not formally tested Sitting-balance support: No upper extremity supported, Feet supported Sitting balance-Leahy Scale: Good         Standing balance comment: Pt stood without an AD       Pertinent Vitals/Pain Pain Assessment Pain Assessment: No/denies pain    Home Living Family/patient expects to be discharged to:: Private residence Living Arrangements: Children;Spouse/significant other (son, sister and spouse) Available Help at Discharge: Family;Available 24 hours/day Type of Home: House Home Access: Stairs to enter Entrance Stairs-Rails: None Entrance Stairs-Number of Steps: 2   Home Layout: One level Home Equipment: None Additional Comments: Home O2    Prior Function Prior Level of Function : Independent/Modified Independent;Driving             Mobility Comments: Pt is on 7-8L at rest at home. He uses 10L with ambulation at home. Drops down to mid 80's with ambulation on 10L but will get back up quickly. ADLs Comments: Pt states he is independent most of the time with ADLs but has needed more assistance lately. He will drive sometimes, but his family will do the shopping. Pt states his daughter manages medications and finances for him.  Extremity/Trunk Assessment   Upper Extremity Assessment Upper Extremity Assessment: Generalized weakness    Lower Extremity Assessment Lower Extremity Assessment: Generalized weakness    Cervical / Trunk  Assessment Cervical / Trunk Assessment: Normal  Communication   Communication Communication: No apparent difficulties    Cognition Arousal: Alert Behavior During Therapy: WFL for tasks assessed/performed   PT - Cognitive impairments: No apparent impairments     Following commands: Intact       Cueing Cueing Techniques: Verbal cues, Tactile cues     General Comments General comments (skin integrity, edema, etc.): Pt son and daughter at bedside. Pt on 7L O2 vai HF [place]. With mobility pt O2 sats dropped to 75% pt placed on 10 L then 13 L O2 to get O2 sats back up over 88% taking ~5 min. Pt is breathes orally and requires frequent verbal cues for purse lipped breathing. Discussed possible simple mask wtih RN to help pt get O2 when he has difficulty remembering to breathe through the nose during mobility        Assessment/Plan    PT Assessment Patient needs continued PT services  PT Problem List Decreased strength;Decreased activity tolerance;Decreased balance;Decreased mobility;Cardiopulmonary status limiting activity       PT Treatment Interventions DME instruction;Gait training;Stair training;Functional mobility training;Therapeutic activities;Therapeutic exercise;Balance training;Neuromuscular re-education;Patient/family education    PT Goals (Current goals can be found in the Care Plan section)  Acute Rehab PT Goals Patient Stated Goal: Return home PT Goal Formulation: With patient/family Time For Goal Achievement: 03/21/23 Potential to Achieve Goals: Good    Frequency Min 1X/week        AM-PAC PT "6 Clicks" Mobility  Outcome Measure Help needed turning from your back to your side while in a flat bed without using bedrails?: A Little Help needed moving from lying on your back to sitting on the side of a flat bed without using bedrails?: A Little Help needed moving to and from a bed to a chair (including a wheelchair)?: A Little Help needed standing up from a chair  using your arms (e.g., wheelchair or bedside chair)?: A Little Help needed to walk in hospital room?: A Little Help needed climbing 3-5 steps with a railing? : A Little 6 Click Score: 18    End of Session Equipment Utilized During Treatment: Gait belt;Oxygen Activity Tolerance: Treatment limited secondary to medical complications (Comment) (O2 sats)   Nurse Communication: Mobility status;Other (comment) (spoke with RN about pt O2 sats and possible simple mask with O2 durig mobility) PT Visit Diagnosis: Muscle weakness (generalized) (M62.81);Unsteadiness on feet (R26.81)    Time: 5409-8119 PT Time Calculation (min) (ACUTE ONLY): 24 min   Charges:   PT Evaluation $PT Eval Low Complexity: 1 Low PT Treatments $Therapeutic Activity: 8-22 mins PT General Charges $$ ACUTE PT VISIT: 1 Visit        Richard Davenport, DPT, CLT  Acute Rehabilitation Services Office: [phone] (Secure chat preferred)   Richard Davenport 03/07/2023, 10:58 AM

## 2023-03-07 NOTE — Progress Notes (Signed)
PROGRESS NOTE  Richard Davenport ZOX:[id number] DOB: 12-30-1960   PCP: Kalman Shan, MD  Patient is from: Home  DOA: 03/05/2023 LOS: 2  Chief complaints Chief Complaint  Patient presents with   Shortness of Breath     Brief Narrative / Interim history: 63 year old M with PMH of pulmonary fibrosis/ILD/chronic hypoxic RF on 8 to 10 L, liver cirrhosis, TIA, CVA, cerebral aneurysm s/p coil, DM-2, HTN and HLD sent to ED from pulmonary office due to shortness of breath and hypoxemia to 40% on home 8 to 10 L, and admitted with acute on chronic respiratory failure likely due to progressive pulmonary fibrosis with recurrent exacerbation.  CT angio chest negative for PE but extensive changes of fibrosis, cardiomegaly and cirrhosis.  Viral respiratory panel negative.  Pulmonology consulted.  Patient was started on broad-spectrum antibiotics, steroid and diuretics.  He is DNR/DNI.  Palliative medicine consulted.  03/07/2023: Patient seen alongside patient's daughter.  No new changes.  Patient is currently on 12 L of supplemental oxygen.  Palliative care input is appreciated.  As per palliative care team, patient and patient's family are not comfortable with further hospice discussion.  Subjective: -No new complaints. -Remains quite short of breath.  Objective: Vitals:   03/07/23 0300 03/07/23 0400 03/07/23 0812 03/07/23 1200  BP: 122/84 (!) 127/90  (!) 134/93  Pulse: 98 (!) 107 92 97  Resp: (!) 28 (!) 33 20 20  Temp:  97.8 F (36.6 C) 98.3 F (36.8 C)   TempSrc:  Oral Oral   SpO2: 98% 95% 96% 92%  Weight:      Height:        Examination: GENERAL: Patient remains in significant respiratory distress.  Patient gets dyspneic on minimal exertion.   HEENT: Patient is pale. NECK: Supple.   RESP: Decreased air entry globally.  Close globally. CVS: S1-S2..  ABD: Soft and nontender. NEURO: Patient is awake and alert.    Procedures:  None  Microbiology summarized: COVID-19,  influenza and RSV PCR nonreactive 20 pathogen RVP negative Blood cultures NGTD  Assessment and plan: Acute on chronic respiratory failure with hypoxia due to progressive pulmonary fibrosis/ILD: Sent to ED from pulmonology office due to shortness of breath and hypoxemia to 40% on home 8 to 10 L.  Required up to 14 L with NRB but weaned to 8 L by [place].  Currently saturating in upper 90s to 100 on 9 L at rest but easily desaturates with minimal exertion.  Also significant work of breathing with minimal exertion.  CT angio chest as above. On treatment with pirfenidone, rituximab, prednisone as outpatient  -Appreciate help by pulmonology -Continue broad-spectrum antibiotics, steroid and breathing treatments. -Check MRSA PCR screen. -Palliative medicine to see patient 03/07/2023: Pulmonary teams input is appreciated.  Continue current management.  Guarded prognosis.  Patient and patient's family are not keen on further hospice discussion, at least, during this hospital stay.     Liver cirrhosis/elevated liver enzymes: Looks compensated.  Mild elevated liver enzymes.  Family history of NASH.  Received IV Lasix yesterday.  Creatinine slightly up -Continue home Lasix and Aldactone.  Hypertension:  -Blood pressure is reasonably controlled. -Continue current regimen.     NIDDM-2 with hyperglycemia: A1c 4.9%.  Hyperglycemia likely due to steroid Recent Labs  Lab 03/06/23 1955 03/07/23 0028 03/07/23 0408 03/07/23 0813 03/07/23 1200  GLUCAP 207* 112* 187* 233* 246*  -Add Semglee 15 units daily -Continue SSI-resistant  Hypergammaglobulinemia/inflammatory dermatosis: Followed by allergy and immunology outpatient. -Outpatient follow-up.  History of  TIA/CVA/cerebral aneurysm s/p coil. - Noted  Anemia of chronic disease: Hgb 7.8 (baseline ranges from 8-9).  No overt bleeding. Recent Labs    12/20/22 0543 02/02/23 1430 02/11/23 1835 02/12/23 0030 02/14/23 1039 02/15/23 0400 03/05/23 1207  03/05/23 1217 03/06/23 0347 03/07/23 0351  HGB 8.1* 9.2* 9.3* 8.5* 8.4* 8.2* 9.8* 10.9* 7.8* 8.2*  -Continue monitoring   Chronic thrombocytopenia: Likely due to liver cirrhosis.  Relatively stable -Continue monitoring   Goals of care-DNR/DNI  Body mass index is 24.33 kg/m.          DVT prophylaxis:  SCDs Start: 03/05/23 1427  Code Status: DNR/DNI Family Communication: Updated patient's daughters, and son at bedside. Level of care: Progressive Status is: Inpatient Remains inpatient appropriate because: Acute on chronic respiratory failure with hypoxia due to ILD flare   Final disposition: To be determined Consultants:  PCCM Palliative medicine  55 minutes with more than 50% spent in reviewing records, counseling patient/family and coordinating care.   Sch Meds:  Scheduled Meds:  budesonide  0.5 mg Nebulization BID   furosemide  40 mg Oral Daily   gabapentin  300 mg Oral TID   insulin aspart  0-20 Units Subcutaneous Q4H   insulin glargine-yfgn  15 Units Subcutaneous Daily   sodium chloride flush  3 mL Intravenous Q12H   spironolactone  50 mg Oral Daily   Continuous Infusions:  ceFEPime (MAXIPIME) IV 2 g (03/07/23 1129)   methylPREDNISolone (SOLU-MEDROL) injection 500 mg (03/07/23 0553)   vancomycin 1,000 mg (03/07/23 0422)   PRN Meds:.acetaminophen **OR** acetaminophen, albuterol, polyethylene glycol  Antimicrobials: Anti-infectives (From admission, onward)    Start     Dose/Rate Route Frequency Ordered Stop   03/06/23 0500  vancomycin (VANCOCIN) IVPB 1000 mg/200 mL premix        1,000 mg 200 mL/hr over 60 Minutes Intravenous Every 12 hours 03/05/23 1632     03/05/23 1700  ceFEPIme (MAXIPIME) 2 g in sodium chloride 0.9 % 100 mL IVPB        2 g 200 mL/hr over 30 Minutes Intravenous Every 8 hours 03/05/23 1632     03/05/23 1630  vancomycin (VANCOREADY) IVPB 1500 mg/300 mL        1,500 mg 150 mL/hr over 120 Minutes Intravenous  Once 03/05/23 1632  03/06/23 0010        I have personally reviewed the following labs and images: CBC: Recent Labs  Lab 03/05/23 1207 03/05/23 1217 03/06/23 0347 03/07/23 0351  WBC 7.4  --  5.9 9.9  NEUTROABS 6.9  --   --   --   HGB 9.8* 10.9* 7.8* 8.2*  HCT 31.4* 32.0* 25.6* 25.8*  MCV 116.7*  --  117.4* 113.7*  PLT 115*  --  92* 103*   BMP &GFR Recent Labs  Lab 03/05/23 1207 03/05/23 1217 03/06/23 0347 03/07/23 0351  NA 138 144 138 135  K 4.2 4.3 4.2 3.8  CL 102  --  103 101  CO2 30  --  27 28  GLUCOSE 140*  --  264* 220*  BUN 24*  --  26* 29*  CREATININE 0.74  --  0.90 1.01  CALCIUM 8.0*  --  7.8* 7.7*  MG  --   --   --  1.8  PHOS  --   --   --  2.5   Estimated Creatinine Clearance: 75.2 mL/min (by C-G formula based on SCr of 1.01 mg/dL). Liver & Pancreas: Recent Labs  Lab 03/05/23 1207 03/06/23 0347 03/07/23  0351  AST 91* 94* 87*  ALT 81* 77* 75*  ALKPHOS 477* 435* 367*  BILITOT 1.7* 1.2 1.4*  PROT 8.1 7.3 7.3  ALBUMIN <1.5* <1.5* <1.5*   No results for input(s): "LIPASE", "AMYLASE" in the last 168 hours. No results for input(s): "AMMONIA" in the last 168 hours. Diabetic: No results for input(s): "HGBA1C" in the last 72 hours. Recent Labs  Lab 03/06/23 1955 03/07/23 0028 03/07/23 0408 03/07/23 0813 03/07/23 1200  GLUCAP 207* 112* 187* 233* 246*   Cardiac Enzymes: No results for input(s): "CKTOTAL", "CKMB", "CKMBINDEX", "TROPONINI" in the last 168 hours. Recent Labs    11/05/22 1434  PROBNP 111.0*   Coagulation Profile: Recent Labs  Lab 03/07/23 0351  INR 1.9*   Thyroid Function Tests: No results for input(s): "TSH", "T4TOTAL", "FREET4", "T3FREE", "THYROIDAB" in the last 72 hours. Lipid Profile: No results for input(s): "CHOL", "HDL", "LDLCALC", "TRIG", "CHOLHDL", "LDLDIRECT" in the last 72 hours. Anemia Panel: No results for input(s): "VITAMINB12", "FOLATE", "FERRITIN", "TIBC", "IRON", "RETICCTPCT" in the last 72 hours. Urine analysis:     Component Value Date/Time   COLORURINE AMBER (A) 12/19/2022 1604   APPEARANCEUR CLEAR 12/19/2022 1604   LABSPEC 1.021 12/19/2022 1604   PHURINE 6.0 12/19/2022 1604   GLUCOSEU NEGATIVE 12/19/2022 1604   HGBUR SMALL (A) 12/19/2022 1604   BILIRUBINUR NEGATIVE 12/19/2022 1604   KETONESUR NEGATIVE 12/19/2022 1604   PROTEINUR NEGATIVE 12/19/2022 1604   UROBILINOGEN 0.2 05/27/2014 1230   NITRITE NEGATIVE 12/19/2022 1604   LEUKOCYTESUR NEGATIVE 12/19/2022 1604   Sepsis Labs: Invalid input(s): "PROCALCITONIN", "LACTICIDVEN"  Microbiology: Recent Results (from the past 240 hours)  Resp panel by RT-PCR (RSV, Flu A&B, Covid) Anterior Nasal Swab     Status: None   Collection Time: 03/05/23 11:40 AM   Specimen: Anterior Nasal Swab  Result Value Ref Range Status   SARS Coronavirus 2 by RT PCR NEGATIVE NEGATIVE Final   Influenza A by PCR NEGATIVE NEGATIVE Final   Influenza B by PCR NEGATIVE NEGATIVE Final    Comment: (NOTE) The Xpert Xpress SARS-CoV-2/FLU/RSV plus assay is intended as an aid in the diagnosis of influenza from Nasopharyngeal swab specimens and should not be used as a sole basis for treatment. Nasal washings and aspirates are unacceptable for Xpert Xpress SARS-CoV-2/FLU/RSV testing.  Fact Sheet for Patients: BloggerCourse.com  Fact Sheet for Healthcare Providers: SeriousBroker.it  This test is not yet approved or cleared by the [place] FDA and has been authorized for detection and/or diagnosis of SARS-CoV-2 by FDA under an Emergency Use Authorization (EUA). This EUA will remain in effect (meaning this test can be used) for the duration of the COVID-19 declaration under Section 564(b)(1) of the Act, 21 U.S.C. section 360bbb-3(b)(1), unless the authorization is terminated or revoked.     Resp Syncytial Virus by PCR NEGATIVE NEGATIVE Final    Comment: (NOTE) Fact Sheet for  Patients: BloggerCourse.com  Fact Sheet for Healthcare Providers: SeriousBroker.it  This test is not yet approved or cleared by the [place] FDA and has been authorized for detection and/or diagnosis of SARS-CoV-2 by FDA under an Emergency Use Authorization (EUA). This EUA will remain in effect (meaning this test can be used) for the duration of the COVID-19 declaration under Section 564(b)(1) of the Act, 21 U.S.C. section 360bbb-3(b)(1), unless the authorization is terminated or revoked.  Performed at [facility], [address] [place], [place] [address]   Respiratory (~20 pathogens) panel by PCR     Status: None  Collection Time: 03/05/23  9:52 PM   Specimen: Nasopharyngeal Swab; Respiratory  Result Value Ref Range Status   Adenovirus NOT DETECTED NOT DETECTED Final   Coronavirus 229E NOT DETECTED NOT DETECTED Final    Comment: (NOTE) The Coronavirus on the Respiratory Panel, DOES NOT test for the novel  Coronavirus (2019 nCoV)    Coronavirus HKU1 NOT DETECTED NOT DETECTED Final   Coronavirus NL63 NOT DETECTED NOT DETECTED Final   Coronavirus OC43 NOT DETECTED NOT DETECTED Final   Metapneumovirus NOT DETECTED NOT DETECTED Final   Rhinovirus / Enterovirus NOT DETECTED NOT DETECTED Final   Influenza A NOT DETECTED NOT DETECTED Final   Influenza B NOT DETECTED NOT DETECTED Final   Parainfluenza Virus 1 NOT DETECTED NOT DETECTED Final   Parainfluenza Virus 2 NOT DETECTED NOT DETECTED Final   Parainfluenza Virus 3 NOT DETECTED NOT DETECTED Final   Parainfluenza Virus 4 NOT DETECTED NOT DETECTED Final   Respiratory Syncytial Virus NOT DETECTED NOT DETECTED Final   Bordetella pertussis NOT DETECTED NOT DETECTED Final   Bordetella Parapertussis NOT DETECTED NOT DETECTED Final   Chlamydophila pneumoniae NOT DETECTED NOT DETECTED Final   Mycoplasma pneumoniae NOT DETECTED NOT DETECTED Final    Comment: Performed at  [facility], [address] [place], [place] [address]  Culture, blood (Routine X 2) w Reflex to ID Panel     Status: None (Preliminary result)   Collection Time: 03/05/23  9:52 PM   Specimen: BLOOD RIGHT ARM  Result Value Ref Range Status   Specimen Description BLOOD RIGHT ARM  Final   Special Requests   Final    BOTTLES DRAWN AEROBIC AND ANAEROBIC Blood Culture results may not be optimal due to an inadequate volume of blood received in culture bottles   Culture   Final    NO GROWTH 2 DAYS Performed at [facility], [address], [place], [place] [address]    Report Status PENDING  Incomplete  Culture, blood (Routine X 2) w Reflex to ID Panel     Status: None (Preliminary result)   Collection Time: 03/05/23  9:53 PM   Specimen: BLOOD LEFT ARM  Result Value Ref Range Status   Specimen Description BLOOD LEFT ARM  Final   Special Requests   Final    BOTTLES DRAWN AEROBIC AND ANAEROBIC Blood Culture adequate volume   Culture   Final    NO GROWTH 2 DAYS Performed at [facility], [address], [place], [place] [address]    Report Status PENDING  Incomplete    Radiology Studies: No results found.    Berton Mount, MD [facility]  If 7PM-7AM, please contact night-coverage [email or web address] 03/07/2023, 12:07 PM

## 2023-03-07 NOTE — Progress Notes (Signed)
Plan of care is reviewed. Pt is alert and fully oriented x 4, afebrile, with shortness of breath with exertions. RR 30-38, HR at rest 110, sinus tachycardia, SPO2 95-97%. On 7LPM of HFNL.  However,during short distance of ambulation to BSC, HR 140 with desat in 70s%.  We occasionally increase O2 flow up to 10 LPM.  His MEWS score is in yellow or red. MD has been aware.  Family members are at bedside. We will monitor closely.   Filiberto Pinks, RN

## 2023-03-07 NOTE — Progress Notes (Signed)
Daily Progress Note   Patient Name: Richard Davenport       Date: 03/07/2023 DOB: 09/14/1960  Age: 63 y.o. MRN#: [id number] Attending Physician: Richard Chapel, MD Primary Care Physician: Richard Shan, MD Admit Date: 03/05/2023 Length of Stay: 2 days  Reason for Consultation/Follow-up: Establishing goals of care  HPI/Patient Profile:  63 y.o. male  with past medical history of hypertension, hyperlipidemia, diabetes, TIA/CVA, cerebral aneurysm s/p coil, AKI, cirrhosis family history NASH), ILD chronic 5L oxygen with recent increase to 8-10L admitted on 03/05/2023 with worsening shortness of breath due to worsening ILD. Dr. Marchelle Davenport has educated about poor prognosis and given recommendation to consider hospice. DNR/DNI status has been established.   Subjective:   Subjective: Chart Reviewed. Updates received. Patient Assessed. Created space and opportunity for patient  and family to explore thoughts and feelings regarding current medical situation.  Today's Discussion: Today saw the patient at bedside, also present with the patient's wife.  He is laying in bed calmly and able to greet me warmly as I enter.  I explained I am from the palliative medicine team, reviewed conversations with previous palliative medicine provider.  The patient denies significant complaints today.  I told him my hope was to follow-up on hospice conversations to answer any questions and elicit if they have had time to discuss with family and make a possible decision about whether they would like services.  The patient's wife states that they do not feel they need services right now.  They do not have any questions at this time.  I offered that services would be available to them in the future, after hospitalization, if they decided they needed them.  I confirmed that they had contact information for [facility] [place] said they could let them know if services are desired in the future.  I shared the palliative  medicine would back off as goals are clear.  However, we are available for any significant clinical change or new needs. I provided emotional and general support through therapeutic listening, empathy, sharing of stories, and other techniques. I answered all questions and addressed all concerns to the best of my ability.  Review of Systems  Constitutional:        Denies pain in general  Respiratory:  Negative for shortness of breath (At rest).   Gastrointestinal:  Negative for abdominal pain, nausea and vomiting.    Objective:   Vital Signs:  BP (!) 134/93 (BP Location: Right Arm)   Pulse 97   Temp 98.3 F (36.8 C) (Oral)   Resp 20   Ht 5\' 8"  (1.727 m)   Wt 72.6 kg   SpO2 92%   BMI 24.33 kg/m   Physical Exam Vitals and nursing note reviewed.  Constitutional:      General: He is not in acute distress.    Appearance: He is ill-appearing. He is not toxic-appearing.  HENT:     Head: Normocephalic and atraumatic.  Pulmonary:     Effort: Pulmonary effort is normal. No respiratory distress.  Abdominal:     General: Abdomen is flat.  Skin:    General: Skin is warm and dry.  Neurological:     General: No focal deficit present.     Mental Status: He is alert and oriented to person, place, and time.  Psychiatric:        Mood and Affect: Mood normal.        Behavior: Behavior normal.     Palliative Assessment/Data: 40-50%  Existing Vynca/ACP Documentation: None  Assessment & Plan:   Impression: Present on Admission:  Hyperlipidemia  Type 2 diabetes mellitus with hyperglycemia (HCC)  Elevated LFTs  Cirrhosis (HCC)  Lung disease, interstitial (HCC)  Cerebral aneurysm, nonruptured  Essential hypertension  Acute on chronic respiratory failure with hypoxia (HCC)  SUMMARY OF RECOMMENDATIONS   DNR-limited Recommend contacting [facility] [place] in the future if they desire services Goals are clear and they are not desiring further conversations about hospice at  this time Palliative medicine will back off for now Please notify us for any significant clinical change and new palliative needs  Symptom Management:  Per primary team PMT is available to assist as needed  Code Status: DNR-limited  Prognosis: Unable to determine  Discharge Planning: To Be Determined  Discussed with: Patient, patient's family, medical team, nursing team  Thank you for allowing [place] to participate in the care of Richard Davenport PMT will continue to support holistically.  Time Total: 37 min  Detailed review of medical records (labs, imaging, vital signs), medically appropriate exam, discussed with treatment team, counseling and education to patient, family, & staff, documenting clinical information, medication management, coordination of care  Richard Dust, NP Palliative Medicine Team  Team Phone # [phone] (Nights/Weekends)  09/19/2020, 8:17 AM

## 2023-03-07 NOTE — Progress Notes (Signed)
RT called by RN due to desaturation event. Pt states he has difficulty breathing when ambulating due to mouth breathing. RT left Ventimask at bedside set at 50% if needed. Pt is currently VSS at this time on 7L HFNC

## 2023-03-07 NOTE — Evaluation (Signed)
Occupational Therapy Evaluation and Discharge Patient Details Name: Richard Davenport MRN: [id number] DOB: 11-19-1960 Today's Date: 03/07/2023   History of Present Illness   Pt is 63 yo presenting to [facility] ED on 2/12 due to shortness of breathe and hypoxemia of 40% on 8-10 L. Admitted with acute on chronic respiratory failure related to progressive pulmonary fibrosis with recurrent exacerbation. Hx of SAH s/p aneurysm coiling, hepatic cirrhosis, HLD, anemia of chronic disease, chronic thrombocytopenia     Clinical Impressions This 63 yo male admitted with above presents to acute OT with all education completed with pt and family as to energy conservation, use of DME to help with breathing, and [place] of venturi mask. Pt will have all necessary help at home from family post D/C, so no further OT needs, we will sign off.     If plan is discharge home, recommend the following:   A little help with walking and/or transfers;A lot of help with bathing/dressing/bathroom;Assistance with cooking/housework;Help with stairs or ramp for entrance;Assist for transportation     Functional Status Assessment   Patient has had a recent decline in their functional status and demonstrates the ability to make significant improvements in function in a reasonable and predictable amount of time. (with all education completed and DME recommended)     Equipment Recommendations   BSC/3in1;Wheelchair (measurements OT);Wheelchair cushion (measurements OT);Tub/shower seat      Precautions/Restrictions   Precautions Precautions: Fall Recall of Precautions/Restrictions: Intact Precaution/Restrictions Comments: monitor O2 Restrictions Weight Bearing Restrictions Per Provider Order: No     Mobility Bed Mobility Overal bed mobility: Modified Independent Bed Mobility: Supine to Sit, Sit to Supine                Transfers Overall transfer level: Needs assistance Equipment used: None Transfers: Sit  to/from Stand, Bed to chair/wheelchair/BSC Sit to Stand: Contact guard assist     Step pivot transfers: Contact guard assist            Balance Overall balance assessment: Mild deficits observed, not formally tested (in standing)                                         ADL either performed or assessed with clinical judgement   ADL                                         General ADL Comments: Currently pt can try and do his ADLs but is is really going to affect his breathing with increased recovery so would be best suited for somone to do it form or assist quite a bit. Educated on purse lipped breathing (as PT did earlier as well). Educated on using O2 when in shower, to have vent fan on and door to bathoom open to get air flow, and to use lukewarm water not hot water. Also recommended he do tasks in steps (ie: bath>rest/focus on breathing>dry off>rest/focus on breathing>get dressed>rest/focus on breathing. Energy Conservation handout  (5 P's provided to pt and family)     Vision Patient Visual Report: No change from baseline              Pertinent Vitals/Pain Pain Assessment Pain Assessment: No/denies pain     Extremity/Trunk Assessment Upper Extremity Assessment Upper Extremity Assessment: Overall WFL for tasks  assessed      Communication Communication Communication: No apparent difficulties   Cognition Arousal: Alert Behavior During Therapy: WFL for tasks assessed/performed                                 Following commands: Intact       Cueing    Cueing Techniques: Verbal cues (for purse lipped breathing)              Home Living Family/patient expects to be discharged to:: Private residence Living Arrangements: Children;Spouse/significant Richard (son, sister, spouse, daughter) Available Help at Discharge: Family;Available 24 hours/day Type of Home: House Home Access: Stairs to enter [employer] of Steps: 2 Entrance Stairs-Rails: None Home Layout: One level     Bathroom Shower/Tub: IT trainer: Standard Bathroom Accessibility: Yes   Home Equipment: None   Additional Comments: Home O2      Prior Functioning/Environment Prior Level of Function : Independent/Modified Independent;Driving             Mobility Comments: Pt is on 7-8L at rest at home. He uses 10L with ambulation at home. Drops down to mid 80's with ambulation on 10L but will get back up quickly. ADLs Comments: Pt states he is independent most of the time with ADLs but has needed more assistance lately. He will drive sometimes, but his family will do the shopping. Pt states his daughter manages medications and finances for him.    OT Problem List: Cardiopulmonary status limiting activity        OT Goals(Current goals can be found in the care plan section)   Acute Rehab OT Goals Patient Stated Goal: for breathing to be better         AM-PAC OT "6 Clicks" Daily Activity     Outcome Measure Help from another person eating meals?: None Help from another person taking care of personal grooming?:  (varies from alot to a little dependent on his breathing) Help from another person toileting, which includes using toliet, bedpan, or urinal?:  (varies from alot to a little dependent on his breathing) Help from another person bathing (including washing, rinsing, drying)?:  (varies from alot to a little dependent on his breathing) Help from another person to put on and taking off regular upper body clothing?:  (varies from alot to a little dependent on his breathing) Help from another person to put on and taking off regular lower body clothing?:  (varies from alot to a little dependent on his breathing) 6 Click Score: 4   End of Session Equipment Utilized During Treatment: Oxygen (liters [place], 10-12 liters Venturi)  Activity Tolerance:  (Limited due to increased work of  breathing even with minimal movement) Patient left: in bed;with call bell/phone within reach;with family/visitor present  OT Visit Diagnosis: Muscle weakness (generalized) (M62.81)                Time: 1112-1200 OT Time Calculation (min): 48 min Charges:  OT General Charges $OT Visit: 1 Visit OT Evaluation $OT Eval Moderate Complexity: 1 Mod OT Treatments $Self Care/Home Management : 23-37 mins  Lindon Romp OT Acute Rehabilitation Services Office [phone]   Evette Georges 03/07/2023, 1:08 PM

## 2023-03-07 NOTE — Progress Notes (Signed)
NAME:  Richard Davenport, MRN:  [id number], DOB:  05-23-1960, LOS: 2 ADMISSION DATE:  03/05/2023, CONSULTATION DATE: 03/05/2023 REFERRING MD: Leslye Peer, MD, CHIEF COMPLAINT: Interstitial lung disease with exacerbation  History of Present Illness:   63yo M never smoker from [place] with history of GERD, TIA, SAH s/p aneurysm, cirrhosis.  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  Currently on immunosuppression with prednisone 10 mg, rituximab and antifibrotic therapy with pirfenidone.  Was initially on CellCept Has progressive lung disease with recent hospitalization end of January 2025 for acute on chronic hypoxic respiratory failure was treated with IV Solu-Medrol, nebs, antibiotics.  Seen in pulmonary clinic today with sats in the 40s and sent to the ED for admission and further management.  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   Significant Hospital Events: Including procedures, antibiotic start and stop dates in addition to other pertinent events   CTA chest 2/12>>> 1. No evidence of pulmonary embolus. 2. Extensive changes of fibrosis. More confluent opacities noted in the lower lobes, similar prior study which may also reflect chronic fibrotic changes, but difficult to completely exclude superimposed edema or infection. 3. Cardiomegaly. 4. Cirrhosis with upper abdominal ascites, stable.  Interim History / Subjective:  Improved breathing  Weaned to 7 L [place] Day 3 of pulse steroids  today Objective   Blood pressure (!) 134/93, pulse 97, temperature 98.3 F (36.8 C), temperature source Oral, resp. rate 20, height 5\' 8"  (1.727 m), weight 72.6 kg, SpO2 92%.        Intake/Output Summary (Last 24 hours) at 03/07/2023 1235 Last data filed at 03/07/2023 0553 Gross per 24 hour  Intake 1226.81 ml  Output 450 ml  Net 776.81 ml   Filed Weights   03/05/23 1128  Weight: 72.6 kg   Physical Exam: General: Chronically ill-appearing, no acute distress HENT: [place], AT, OP clear, MMM Eyes: EOMI, no scleral icterus Respiratory: Bibasilar crackles, occasional squeaking Cardiovascular: RRR, -M/R/G, no JVD GI: BS+, soft, nontender Extremities:-Edema,-tenderness Neuro: AAO x4, CNII-XII grossly intact  Imaging, labs and test noted above have been reviewed independently by me.  CTA Chest - no evidence of pulmonary emboli. Chronic fibrosis, similar with possibly increased confluent opacities in bases bilaterally  Resolved Hospital Problem list     Assessment & Plan:  Interstitial lung disease, diffuse progressive pulmonary fibrosis with recurrent exacerbation On treatment with pirfenidone, rituximab, prednisone as outpatient P:  Continue abx - Vanco, cefepime given recent hospitalization RVP neg  Continue IV steroids. Improving  >Transition to PO tomorrow starting with prednisone 60 mg daily  Goals of care Poor prognosis overall with progression of ILD. Discussed at length at bedside with pt, family and palliative care NP. DNR/DNI confirmed. Pt agreeable to short admission for IV abx, IV steroids 24-48hrs. Hopefully this time will also allow palliative care to arrange hospice assistance at home to focus on comfort. Seems to be improving slowly, but if declines would transition to comfort.    Best Practice (right click and "Reselect all SmartList Selections" daily)   Diet/type: Regular consistency (see orders) DVT prophylaxis prophylactic heparin  Pressure ulcer(s): N/A  GI  prophylaxis: PPI Lines: N/A Foley:  N/A Code Status:  DNR Last date of multidisciplinary goals of care discussion []   Signature:   Care Time: 35 min  Mechele Collin, M.D. [facility] Pulmonary/Critical Care Medicine 03/07/2023 12:47 PM   See Amion for personal pager For hours between 7 PM to 7 AM, please call Elink for urgent questions

## 2023-03-08 DIAGNOSIS — K7469 Other cirrhosis of liver: Secondary | ICD-10-CM | POA: Diagnosis not present

## 2023-03-08 DIAGNOSIS — J9621 Acute and chronic respiratory failure with hypoxia: Secondary | ICD-10-CM | POA: Diagnosis not present

## 2023-03-08 LAB — GLUCOSE, CAPILLARY
Glucose-Capillary: 123 mg/dL — ABNORMAL HIGH (ref 70–99)
Glucose-Capillary: 135 mg/dL — ABNORMAL HIGH (ref 70–99)
Glucose-Capillary: 164 mg/dL — ABNORMAL HIGH (ref 70–99)
Glucose-Capillary: 172 mg/dL — ABNORMAL HIGH (ref 70–99)
Glucose-Capillary: 205 mg/dL — ABNORMAL HIGH (ref 70–99)
Glucose-Capillary: 251 mg/dL — ABNORMAL HIGH (ref 70–99)

## 2023-03-08 LAB — LEGIONELLA PNEUMOPHILA SEROGP 1 UR AG: L. pneumophila Serogp 1 Ur Ag: NEGATIVE

## 2023-03-08 LAB — BRAIN NATRIURETIC PEPTIDE: B Natriuretic Peptide: 155.5 pg/mL — ABNORMAL HIGH (ref 0.0–100.0)

## 2023-03-08 MED ORDER — VANCOMYCIN HCL 1750 MG/350ML IV SOLN: 1750.0000 mg | INTRAVENOUS | Status: DC

## 2023-03-08 NOTE — Progress Notes (Signed)
PROGRESS NOTE  Richard Davenport NFA:[id number] DOB: 1960-10-08   PCP: Kalman Shan, MD  Patient is from: Home  DOA: 03/05/2023 LOS: 3  Chief complaints Chief Complaint  Patient presents with   Shortness of Breath     Brief Narrative / Interim history: 62 year old M with PMH of pulmonary fibrosis/ILD/chronic hypoxic RF on 8 to 10 L, liver cirrhosis, TIA, CVA, cerebral aneurysm s/p coil, DM-2, HTN and HLD sent to ED from pulmonary office due to shortness of breath and hypoxemia to 40% on home 8 to 10 L, and admitted with acute on chronic respiratory failure likely due to progressive pulmonary fibrosis with recurrent exacerbation.  CT angio chest negative for PE but extensive changes of fibrosis, cardiomegaly and cirrhosis.  Viral respiratory panel negative.  Pulmonology consulted.  Patient was started on broad-spectrum antibiotics, steroid and diuretics.  He is DNR/DNI.  Palliative medicine consulted.  03/07/2023: Patient seen alongside patient's daughter.  No new changes.  Patient is currently on 12 L of supplemental oxygen.  Palliative care input is appreciated.  As per palliative care team, patient and patient's family are not comfortable with further hospice discussion.  03/08/2023: Patient seen alongside patient's wife and 4 children.  Patient is on 7 L of supplemental oxygen at rest.  Ascites noted (shifting dullness).  Patient is not keen on paracentesis.  Input from pulmonary team is highly appreciated.  Subjective: -No new complaints.  Objective: Vitals:   03/08/23 0342 03/08/23 0816 03/08/23 1140 03/08/23 1540  BP: (!) 122/90 124/86 (!) 119/90 (!) 125/95  Pulse: 100   (!) 102  Resp: 18   17  Temp: 98.1 F (36.7 C) 98 F (36.7 C) 98.1 F (36.7 C) 98.1 F (36.7 C)  TempSrc: Oral Oral Oral Oral  SpO2: 95%   96%  Weight:      Height:        Examination: GENERAL: Patient remains in significant respiratory distress.  Patient gets dyspneic on minimal exertion.    HEENT: Patient is pale. NECK: Supple.   RESP: Decreased air entry globally.  Close globally. CVS: S1-S2..  ABD: Ascites.  Patient has shifting dullness.   NEURO: Patient is awake and alert.    Procedures:  None  Microbiology summarized: COVID-19, influenza and RSV PCR nonreactive 20 pathogen RVP negative Blood cultures NGTD  Assessment and plan: Acute on chronic respiratory failure with hypoxia due to progressive pulmonary fibrosis/ILD: Sent to ED from pulmonology office due to shortness of breath and hypoxemia to 40% on home 8 to 10 L.  Required up to 14 L with NRB but weaned to 8 L by [place].  Currently saturating in upper 90s to 100 on 9 L at rest but easily desaturates with minimal exertion.  Also significant work of breathing with minimal exertion.  CT angio chest as above. On treatment with pirfenidone, rituximab, prednisone as outpatient  -Appreciate help by pulmonology -Continue broad-spectrum antibiotics, steroid and breathing treatments. -Check MRSA PCR screen. -Palliative medicine to see patient 03/07/2023: Pulmonary teams input is appreciated.  Continue current management.  Guarded prognosis.  Patient and patient's family are not keen on further hospice discussion, at least, during this hospital stay.     Liver cirrhosis/elevated liver enzymes: Looks compensated.  Mild elevated liver enzymes.  Family history of NASH.  Received IV Lasix yesterday.  Creatinine slightly up -Continue home Lasix and Aldactone. 03/08/2023: Ascites noted.  Shifting dullness on examination.  Patient is not keen on paracentesis at the moment.  Hypertension:  -Blood pressure  is reasonably controlled. -Continue current regimen.     NIDDM-2 with hyperglycemia: A1c 4.9%.  Hyperglycemia likely due to steroid Recent Labs  Lab 03/08/23 0021 03/08/23 0418 03/08/23 0812 03/08/23 1139 03/08/23 1609  GLUCAP 251* 205* 135* 123* 164*  -Add Semglee 15 units daily -Continue  SSI-resistant  Hypergammaglobulinemia/inflammatory dermatosis: Followed by allergy and immunology outpatient. -Outpatient follow-up.  History of TIA/CVA/cerebral aneurysm s/p coil. - Noted  Anemia of chronic disease: Hgb 7.8 (baseline ranges from 8-9).  No overt bleeding. Recent Labs    12/20/22 0543 02/02/23 1430 02/11/23 1835 02/12/23 0030 02/14/23 1039 02/15/23 0400 03/05/23 1207 03/05/23 1217 03/06/23 0347 03/07/23 0351  HGB 8.1* 9.2* 9.3* 8.5* 8.4* 8.2* 9.8* 10.9* 7.8* 8.2*  -Continue monitoring   Chronic thrombocytopenia: Likely due to liver cirrhosis.  Relatively stable -Continue monitoring   Goals of care-DNR/DNI  Body mass index is 24.33 kg/m.          DVT prophylaxis:  SCDs Start: 03/05/23 1427  Code Status: DNR/DNI Family Communication: Updated patient's daughters, and son at bedside. Level of care: Progressive Status is: Inpatient Remains inpatient appropriate because: Acute on chronic respiratory failure with hypoxia due to ILD flare   Final disposition: To be determined Consultants:  PCCM Palliative medicine  55 minutes with more than 50% spent in reviewing records, counseling patient/family and coordinating care.   Sch Meds:  Scheduled Meds:  budesonide  0.5 mg Nebulization BID   furosemide  40 mg Oral Daily   gabapentin  300 mg Oral TID   insulin aspart  0-20 Units Subcutaneous Q4H   insulin glargine-yfgn  15 Units Subcutaneous Daily   Pirfenidone  534 mg Oral TID with meals   predniSONE  60 mg Oral Q breakfast   sodium chloride flush  3 mL Intravenous Q12H   spironolactone  50 mg Oral Daily   Continuous Infusions:  ceFEPime (MAXIPIME) IV 2 g (03/08/23 1716)   PRN Meds:.acetaminophen **OR** acetaminophen, albuterol, polyethylene glycol  Antimicrobials: Anti-infectives (From admission, onward)    Start     Dose/Rate Route Frequency Ordered Stop   03/09/23 0500  vancomycin (VANCOREADY) IVPB 1750 mg/350 mL  Status:  Discontinued         1,750 mg 175 mL/hr over 120 Minutes Intravenous Every 24 hours 03/08/23 1100 03/08/23 1500   03/06/23 0500  vancomycin (VANCOCIN) IVPB 1000 mg/200 mL premix  Status:  Discontinued        1,000 mg 200 mL/hr over 60 Minutes Intravenous Every 12 hours 03/05/23 1632 03/08/23 1550   03/05/23 1700  ceFEPIme (MAXIPIME) 2 g in sodium chloride 0.9 % 100 mL IVPB        2 g 200 mL/hr over 30 Minutes Intravenous Every 8 hours 03/05/23 1632     03/05/23 1630  vancomycin (VANCOREADY) IVPB 1500 mg/300 mL        1,500 mg 150 mL/hr over 120 Minutes Intravenous  Once 03/05/23 1632 03/06/23 0010        I have personally reviewed the following labs and images: CBC: Recent Labs  Lab 03/05/23 1207 03/05/23 1217 03/06/23 0347 03/07/23 0351  WBC 7.4  --  5.9 9.9  NEUTROABS 6.9  --   --   --   HGB 9.8* 10.9* 7.8* 8.2*  HCT 31.4* 32.0* 25.6* 25.8*  MCV 116.7*  --  117.4* 113.7*  PLT 115*  --  92* 103*   BMP &GFR Recent Labs  Lab 03/05/23 1207 03/05/23 1217 03/06/23 0347 03/07/23 0351  NA 138 144  138 135  K 4.2 4.3 4.2 3.8  CL 102  --  103 101  CO2 30  --  27 28  GLUCOSE 140*  --  264* 220*  BUN 24*  --  26* 29*  CREATININE 0.74  --  0.90 1.01  CALCIUM 8.0*  --  7.8* 7.7*  MG  --   --   --  1.8  PHOS  --   --   --  2.5   Estimated Creatinine Clearance: 75.2 mL/min (by C-G formula based on SCr of 1.01 mg/dL). Liver & Pancreas: Recent Labs  Lab 03/05/23 1207 03/06/23 0347 03/07/23 0351  AST 91* 94* 87*  ALT 81* 77* 75*  ALKPHOS 477* 435* 367*  BILITOT 1.7* 1.2 1.4*  PROT 8.1 7.3 7.3  ALBUMIN <1.5* <1.5* <1.5*   No results for input(s): "LIPASE", "AMYLASE" in the last 168 hours. No results for input(s): "AMMONIA" in the last 168 hours. Diabetic: No results for input(s): "HGBA1C" in the last 72 hours. Recent Labs  Lab 03/08/23 0021 03/08/23 0418 03/08/23 0812 03/08/23 1139 03/08/23 1609  GLUCAP 251* 205* 135* 123* 164*   Cardiac Enzymes: No results for input(s):  "CKTOTAL", "CKMB", "CKMBINDEX", "TROPONINI" in the last 168 hours. Recent Labs    11/05/22 1434  PROBNP 111.0*   Coagulation Profile: Recent Labs  Lab 03/07/23 0351  INR 1.9*   Thyroid Function Tests: No results for input(s): "TSH", "T4TOTAL", "FREET4", "T3FREE", "THYROIDAB" in the last 72 hours. Lipid Profile: No results for input(s): "CHOL", "HDL", "LDLCALC", "TRIG", "CHOLHDL", "LDLDIRECT" in the last 72 hours. Anemia Panel: No results for input(s): "VITAMINB12", "FOLATE", "FERRITIN", "TIBC", "IRON", "RETICCTPCT" in the last 72 hours. Urine analysis:    Component Value Date/Time   COLORURINE AMBER (A) 12/19/2022 1604   APPEARANCEUR CLEAR 12/19/2022 1604   LABSPEC 1.021 12/19/2022 1604   PHURINE 6.0 12/19/2022 1604   GLUCOSEU NEGATIVE 12/19/2022 1604   HGBUR SMALL (A) 12/19/2022 1604   BILIRUBINUR NEGATIVE 12/19/2022 1604   KETONESUR NEGATIVE 12/19/2022 1604   PROTEINUR NEGATIVE 12/19/2022 1604   UROBILINOGEN 0.2 05/27/2014 1230   NITRITE NEGATIVE 12/19/2022 1604   LEUKOCYTESUR NEGATIVE 12/19/2022 1604   Sepsis Labs: Invalid input(s): "PROCALCITONIN", "LACTICIDVEN"  Microbiology: Recent Results (from the past 240 hours)  Resp panel by RT-PCR (RSV, Flu A&B, Covid) Anterior Nasal Swab     Status: None   Collection Time: 03/05/23 11:40 AM   Specimen: Anterior Nasal Swab  Result Value Ref Range Status   SARS Coronavirus 2 by RT PCR NEGATIVE NEGATIVE Final   Influenza A by PCR NEGATIVE NEGATIVE Final   Influenza B by PCR NEGATIVE NEGATIVE Final    Comment: (NOTE) The Xpert Xpress SARS-CoV-2/FLU/RSV plus assay is intended as an aid in the diagnosis of influenza from Nasopharyngeal swab specimens and should not be used as a sole basis for treatment. Nasal washings and aspirates are unacceptable for Xpert Xpress SARS-CoV-2/FLU/RSV testing.  Fact Sheet for Patients: BloggerCourse.com  Fact Sheet for Healthcare  Providers: SeriousBroker.it  This test is not yet approved or cleared by the [place] FDA and has been authorized for detection and/or diagnosis of SARS-CoV-2 by FDA under an Emergency Use Authorization (EUA). This EUA will remain in effect (meaning this test can be used) for the duration of the COVID-19 declaration under Section 564(b)(1) of the Act, 21 U.S.C. section 360bbb-3(b)(1), unless the authorization is terminated or revoked.     Resp Syncytial Virus by PCR NEGATIVE NEGATIVE Final    Comment: (NOTE) Fact  Sheet for Patients: BloggerCourse.com  Fact Sheet for Healthcare Providers: SeriousBroker.it  This test is not yet approved or cleared by the [place] FDA and has been authorized for detection and/or diagnosis of SARS-CoV-2 by FDA under an Emergency Use Authorization (EUA). This EUA will remain in effect (meaning this test can be used) for the duration of the COVID-19 declaration under Section 564(b)(1) of the Act, 21 U.S.C. section 360bbb-3(b)(1), unless the authorization is terminated or revoked.  Performed at [facility], [address], [place], [place] [address]   Respiratory (~20 pathogens) panel by PCR     Status: None   Collection Time: 03/05/23  9:52 PM   Specimen: Nasopharyngeal Swab; Respiratory  Result Value Ref Range Status   Adenovirus NOT DETECTED NOT DETECTED Final   Coronavirus 229E NOT DETECTED NOT DETECTED Final    Comment: (NOTE) The Coronavirus on the Respiratory Panel, DOES NOT test for the novel  Coronavirus (2019 nCoV)    Coronavirus HKU1 NOT DETECTED NOT DETECTED Final   Coronavirus NL63 NOT DETECTED NOT DETECTED Final   Coronavirus OC43 NOT DETECTED NOT DETECTED Final   Metapneumovirus NOT DETECTED NOT DETECTED Final   Rhinovirus / Enterovirus NOT DETECTED NOT DETECTED Final   Influenza A NOT DETECTED NOT DETECTED Final   Influenza B NOT DETECTED  NOT DETECTED Final   Parainfluenza Virus 1 NOT DETECTED NOT DETECTED Final   Parainfluenza Virus 2 NOT DETECTED NOT DETECTED Final   Parainfluenza Virus 3 NOT DETECTED NOT DETECTED Final   Parainfluenza Virus 4 NOT DETECTED NOT DETECTED Final   Respiratory Syncytial Virus NOT DETECTED NOT DETECTED Final   Bordetella pertussis NOT DETECTED NOT DETECTED Final   Bordetella Parapertussis NOT DETECTED NOT DETECTED Final   Chlamydophila pneumoniae NOT DETECTED NOT DETECTED Final   Mycoplasma pneumoniae NOT DETECTED NOT DETECTED Final    Comment: Performed at [facility], [address], [place], [place] [address]  Culture, blood (Routine X 2) w Reflex to ID Panel     Status: None (Preliminary result)   Collection Time: 03/05/23  9:52 PM   Specimen: BLOOD RIGHT ARM  Result Value Ref Range Status   Specimen Description BLOOD RIGHT ARM  Final   Special Requests   Final    BOTTLES DRAWN AEROBIC AND ANAEROBIC Blood Culture results may not be optimal due to an inadequate volume of blood received in culture bottles   Culture   Final    NO GROWTH 3 DAYS Performed at [facility], [address], [place], [place] [address]    Report Status PENDING  Incomplete  Culture, blood (Routine X 2) w Reflex to ID Panel     Status: None (Preliminary result)   Collection Time: 03/05/23  9:53 PM   Specimen: BLOOD LEFT ARM  Result Value Ref Range Status   Specimen Description BLOOD LEFT ARM  Final   Special Requests   Final    BOTTLES DRAWN AEROBIC AND ANAEROBIC Blood Culture adequate volume   Culture   Final    NO GROWTH 3 DAYS Performed at [facility], [address], [place], [place] [address]    Report Status PENDING  Incomplete  MRSA Next Gen by PCR, Nasal     Status: None   Collection Time: 03/06/23 11:20 AM   Specimen: Nasal Mucosa; Nasal Swab  Result Value Ref Range Status   MRSA by PCR Next Gen NOT DETECTED NOT DETECTED Final    Comment: (NOTE) The  GeneXpert MRSA Assay (FDA  approved for NASAL specimens only), is one component of a comprehensive MRSA colonization surveillance program. It is not intended to diagnose MRSA infection nor to guide or monitor treatment for MRSA infections. Test performance is not FDA approved in patients less than 21 years old. Performed at [facility], [address] [place], [place] [address]     Radiology Studies: No results found.    Berton Mount, MD [facility]  If 7PM-7AM, please contact night-coverage [email or web address] 03/08/2023, 6:37 PM

## 2023-03-08 NOTE — Progress Notes (Addendum)
Pharmacy Antibiotic Note  Richard Davenport is a 63 y.o. male admitted on 03/05/2023 with sepsis.  Pharmacy has been consulted for vancomycin and cefepime dosing.  Recently admitted in 02/15/23 for shortness of breath. Received 5 days of azithromycin and ceftriaxone x1 with previous admission. WBC 9.9, afebrile, Scr 0.74 > 1.01 (CrCl ~75 mL/min)  Plan: Discontinue vancomycin  Continue cefepime 2g IV q8h Monitor clinical status, renal function, and culture data  Height: 5\' 8"  (172.7 cm) Weight: 72.6 kg (160 lb) IBW/kg (Calculated) : 68.4  Temp (24hrs), Avg:98.2 F (36.8 C), Min:98 F (36.7 C), Richard:98.7 F (37.1 C)  Recent Labs  Lab 03/05/23 1207 03/06/23 0347 03/07/23 0351  WBC 7.4 5.9 9.9  CREATININE 0.74 0.90 1.01    Estimated Creatinine Clearance: 75.2 mL/min (by C-G formula based on SCr of 1.01 mg/dL).    Allergies  Allergen Reactions   Pork-Derived Products Other (See Comments)    Patient is Muslim and PREFERS TO NOT TAKE ANY PORK OR MEAT PRODUCTS (only fish)    Antimicrobials this admission: Vancomycin 2/12 > 2/15  Cefepime 2/12 >>   Microbiology results: 2/12 urinary strep neg 2/12 blood>>ngtd 2/13 MRSA PCR neg  Thank you for allowing pharmacy to be a part of this patient's care.  Roslyn Smiling, PharmD PGY1 Pharmacy Resident 03/08/2023 10:54 AM

## 2023-03-09 DIAGNOSIS — J849 Interstitial pulmonary disease, unspecified: Secondary | ICD-10-CM | POA: Diagnosis not present

## 2023-03-09 DIAGNOSIS — J8410 Pulmonary fibrosis, unspecified: Secondary | ICD-10-CM | POA: Diagnosis not present

## 2023-03-09 DIAGNOSIS — J9621 Acute and chronic respiratory failure with hypoxia: Secondary | ICD-10-CM | POA: Diagnosis not present

## 2023-03-09 LAB — GLUCOSE, CAPILLARY
Glucose-Capillary: 113 mg/dL — ABNORMAL HIGH (ref 70–99)
Glucose-Capillary: 127 mg/dL — ABNORMAL HIGH (ref 70–99)
Glucose-Capillary: 132 mg/dL — ABNORMAL HIGH (ref 70–99)
Glucose-Capillary: 143 mg/dL — ABNORMAL HIGH (ref 70–99)
Glucose-Capillary: 159 mg/dL — ABNORMAL HIGH (ref 70–99)
Glucose-Capillary: 165 mg/dL — ABNORMAL HIGH (ref 70–99)
Glucose-Capillary: 214 mg/dL — ABNORMAL HIGH (ref 70–99)

## 2023-03-09 NOTE — Progress Notes (Signed)
NAME:  Richard Davenport, MRN:  [id number], DOB:  1960-04-27, LOS: 4 ADMISSION DATE:  03/05/2023, CONSULTATION DATE: 03/05/2023 REFERRING MD: Leslye Peer, MD, CHIEF COMPLAINT: Interstitial lung disease with exacerbation  History of Present Illness:   63yo M never smoker from [place] with history of GERD, TIA, SAH s/p aneurysm, cirrhosis.  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  Currently on immunosuppression with prednisone 10 mg, rituximab and antifibrotic therapy with pirfenidone.  Was initially on CellCept Has progressive lung disease with recent hospitalization end of January 2025 for acute on chronic hypoxic respiratory failure was treated with IV Solu-Medrol, nebs, antibiotics.  Seen in pulmonary clinic today with sats in the 40s and sent to the ED for admission and further management.  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   Significant Hospital Events: Including procedures, antibiotic start and stop dates in addition to other pertinent events   CTA chest 2/12>>> 1. No evidence of pulmonary embolus. 2. Extensive changes of fibrosis. More confluent opacities noted in the lower lobes, similar prior study which may also reflect chronic fibrotic changes, but difficult to completely exclude superimposed edema or infection. 3. Cardiomegaly. 4. Cirrhosis with upper abdominal ascites, stable.  Interim History / Subjective:  Reports improved breathing and wanting to go home Unchanged 7L  requirement. Per nursing desaturations to 70% so has been limiting ambulation Day 2 prednisone 60 mg Objective   Blood pressure (!) 125/91, pulse 96, temperature 98 F (36.7 C), temperature source Oral, resp. rate 20, height 5\' 8"  (1.727 m), weight 72.6 kg, SpO2 100%.        Intake/Output Summary (Last 24 hours) at 03/09/2023 1343 Last data filed at 03/09/2023 7829 Gross per 24 hour  Intake 400 ml  Output --  Net 400 ml   Filed Weights   03/05/23 1128  Weight: 72.6 kg    Physical Exam: General: Chronically ill-appearing, no acute distress HENT: [place], AT Eyes: EOMI, no scleral icterus Respiratory: Bibasilar crackles Cardiovascular: RRR, -M/R/G, no JVD Extremities:-Edema,-tenderness Neuro: AAO x4, CNII-XII grossly intact Psych: Normal mood, normal affect   Imaging, labs and test noted above have been reviewed independently by me.  CTA Chest - no evidence of pulmonary emboli. Chronic fibrosis, similar with possibly increased confluent opacities in bases bilaterally  No labs by primary team in the last 24 hours  Resolved Hospital Problem list     Assessment & Plan:  Interstitial lung disease, diffuse progressive pulmonary fibrosis with recurrent exacerbation On treatment with pirfenidone, rituximab, prednisone as outpatient Recent hospital discharge requiring 5-8L at home Oxygen P:  Vanc d/c'd yesterday. Complete five day course of Cefepime today RVP neg  On prednisone 60 mg Day 2 Discussed with RN regarding ambulation. Will notify PT to determine ambulatory O2 needs  >Pending this information, will help determine taper and current O2 needs Pulmonary hygiene: PRN nebs, IS, OOB and activity as tolerated  Goals of care Poor prognosis overall with progression of ILD. Discussed at length at bedside with pt, family and palliative care NP. DNR/DNI confirmed. Pt agreeable to short admission for IV abx, IV steroids 24-48hrs. Hopefully this  time will also allow palliative care to  arrange hospice assistance at home to focus on comfort. Seems to be improving slowly, but if declines would transition to comfort.    Best Practice (right click and "Reselect all SmartList Selections" daily)   Diet/type: Regular consistency (see orders) DVT prophylaxis prophylactic heparin  Pressure ulcer(s): N/A GI prophylaxis: PPI Lines: N/A Foley:  N/A Code Status:  DNR Last date of multidisciplinary goals of care discussion []   Signature:   Care Time: 10 min  Mechele Collin, M.D. [facility] Pulmonary/Critical Care Medicine 03/09/2023 1:44 PM   See Amion for personal pager For hours between 7 PM to 7 AM, please call Elink for urgent questions

## 2023-03-09 NOTE — Progress Notes (Addendum)
This nurse in room to ambulate patient per order patient required mod assist to sit edge of bed and min assist to stand. heart rate in bed at 105 after sitting edge of bed patient heart rate jumped to 135 and with ambulation from bed to the door patient heart rate maxed out at 162 O2 saturations started at 98% on 7 liters to a maximum desaturation of 73 on 7 liters Non re-breather applied to patient running at 15 LPM patient recovered in dorsal recumbent position with pursed lip breathing within 2 minutes to a O2 saturation of 92 on 15 LPM Non rebreather patient will be weaned back to baseline after resting as vitals signs allow.

## 2023-03-09 NOTE — Progress Notes (Signed)
PROGRESS NOTE  Richard Davenport EAV:[id number] DOB: 06-Jan-1961   PCP: Kalman Shan, MD  Patient is from: Home  DOA: 03/05/2023 LOS: 4  Chief complaints Chief Complaint  Patient presents with   Shortness of Breath     Brief Narrative / Interim history: 63 year old M with PMH of pulmonary fibrosis/ILD/chronic hypoxic RF on 8 to 10 L, liver cirrhosis, TIA, CVA, cerebral aneurysm s/p coil, DM-2, HTN and HLD sent to ED from pulmonary office due to shortness of breath and hypoxemia to 40% on home 8 to 10 L, and admitted with acute on chronic respiratory failure likely due to progressive pulmonary fibrosis with recurrent exacerbation.  CT angio chest negative for PE but extensive changes of fibrosis, cardiomegaly and cirrhosis.  Viral respiratory panel negative.  Pulmonology consulted.  Patient was started on broad-spectrum antibiotics, steroid and diuretics.  He is DNR/DNI.  Palliative medicine consulted.  03/09/2023: Patient seen.  No new changes.  Abdominal distention (ascites) seems to be improving.  Continue diuretics.  Remains on 7 L of supplemental oxygen at rest.  Pulmonary and critical care team input is highly appreciated.  Subjective: -No new complaints.  Objective: Vitals:   03/09/23 0400 03/09/23 0426 03/09/23 0800 03/09/23 1126  BP: 128/84 (!) 127/97 128/88 (!) 125/91  Pulse: 83 98 87 96  Resp:  18 18 20   Temp:  97.6 F (36.4 C) 98 F (36.7 C) 98 F (36.7 C)  TempSrc:  Oral Oral Oral  SpO2: 99% 96% 98% 100%  Weight:      Height:        Examination: GENERAL: Patient remains in significant respiratory distress.  Patient gets dyspneic on minimal exertion.   HEENT: Patient is pale. NECK: Supple.   RESP: Decreased air entry globally.  Close globally. CVS: S1-S2..  ABD: Ascites.  Patient has shifting dullness.   NEURO: Patient is awake and alert.    Procedures:  None  Microbiology summarized: COVID-19, influenza and RSV PCR nonreactive 20 pathogen RVP  negative Blood cultures NGTD  Assessment and plan: Acute on chronic respiratory failure with hypoxia due to progressive pulmonary fibrosis/ILD: Sent to ED from pulmonology office due to shortness of breath and hypoxemia to 40% on home 8 to 10 L.  Required up to 14 L with NRB but weaned to 8 L by [facility].  Currently saturating in upper 90s to 100 on 9 L at rest but easily desaturates with minimal exertion.  Also significant work of breathing with minimal exertion.  CT angio chest as above. On treatment with pirfenidone, rituximab, prednisone as outpatient  -Appreciate help by pulmonology -Continue broad-spectrum antibiotics, steroid and breathing treatments. -Check MRSA PCR screen. -Palliative medicine to see patient 03/07/2023: Pulmonary teams input is appreciated.  Continue current management.  Guarded prognosis.  Patient and patient's family are not keen on further hospice discussion, at least, during this hospital stay.     Liver cirrhosis/elevated liver enzymes: Looks compensated.  Mild elevated liver enzymes.  Family history of NASH.  Received IV Lasix yesterday.  Creatinine slightly up -Continue home Lasix and Aldactone. 03/09/2023: Ascites improving.  Continue diuretics.  Patient is not keen on paracentesis.    Hypertension:  -Blood pressure is reasonably controlled. -Continue current regimen.     NIDDM-2 with hyperglycemia: A1c 4.9%.  Hyperglycemia likely due to steroid Recent Labs  Lab 03/08/23 2042 03/09/23 0030 03/09/23 0428 03/09/23 0807 03/09/23 1152  GLUCAP 172* 165* 127* 132* 159*  -Add Semglee 15 units daily -Continue SSI-resistant  Hypergammaglobulinemia/inflammatory dermatosis: Followed  by allergy and immunology outpatient. -Outpatient follow-up.  History of TIA/CVA/cerebral aneurysm s/p coil. - Noted  Anemia of chronic disease: Hgb 7.8 (baseline ranges from 8-9).  No overt bleeding. Recent Labs    12/20/22 0543 02/02/23 1430 02/11/23 1835 02/12/23 0030  02/14/23 1039 02/15/23 0400 03/05/23 1207 03/05/23 1217 03/06/23 0347 03/07/23 0351  HGB 8.1* 9.2* 9.3* 8.5* 8.4* 8.2* 9.8* 10.9* 7.8* 8.2*  -Continue monitoring   Chronic thrombocytopenia: Likely due to liver cirrhosis.  Relatively stable -Continue monitoring   Goals of care-DNR/DNI  Body mass index is 24.33 kg/m.          DVT prophylaxis:  SCDs Start: 03/05/23 1427  Code Status: DNR/DNI Family Communication: Updated patient's daughters, and son at bedside. Level of care: Progressive Status is: Inpatient Remains inpatient appropriate because: Acute on chronic respiratory failure with hypoxia due to ILD flare   Final disposition: To be determined Consultants:  PCCM Palliative medicine  55 minutes with more than 50% spent in reviewing records, counseling patient/family and coordinating care.   Sch Meds:  Scheduled Meds:  budesonide  0.5 mg Nebulization BID   furosemide  40 mg Oral Daily   gabapentin  300 mg Oral TID   insulin aspart  0-20 Units Subcutaneous Q4H   insulin glargine-yfgn  15 Units Subcutaneous Daily   Pirfenidone  534 mg Oral TID with meals   predniSONE  60 mg Oral Q breakfast   sodium chloride flush  3 mL Intravenous Q12H   spironolactone  50 mg Oral Daily   Continuous Infusions:  ceFEPime (MAXIPIME) IV Stopped (03/09/23 0840)   PRN Meds:.acetaminophen **OR** acetaminophen, albuterol, polyethylene glycol  Antimicrobials: Anti-infectives (From admission, onward)    Start     Dose/Rate Route Frequency Ordered Stop   03/09/23 0500  vancomycin (VANCOREADY) IVPB 1750 mg/350 mL  Status:  Discontinued        1,750 mg 175 mL/hr over 120 Minutes Intravenous Every 24 hours 03/08/23 1100 03/08/23 1500   03/06/23 0500  vancomycin (VANCOCIN) IVPB 1000 mg/200 mL premix  Status:  Discontinued        1,000 mg 200 mL/hr over 60 Minutes Intravenous Every 12 hours 03/05/23 1632 03/08/23 1550   03/05/23 1700  ceFEPIme (MAXIPIME) 2 g in sodium chloride 0.9  % 100 mL IVPB        2 g 200 mL/hr over 30 Minutes Intravenous Every 8 hours 03/05/23 1632 03/09/23 2359   03/05/23 1630  vancomycin (VANCOREADY) IVPB 1500 mg/300 mL        1,500 mg 150 mL/hr over 120 Minutes Intravenous  Once 03/05/23 1632 03/06/23 0010        I have personally reviewed the following labs and images: CBC: Recent Labs  Lab 03/05/23 1207 03/05/23 1217 03/06/23 0347 03/07/23 0351  WBC 7.4  --  5.9 9.9  NEUTROABS 6.9  --   --   --   HGB 9.8* 10.9* 7.8* 8.2*  HCT 31.4* 32.0* 25.6* 25.8*  MCV 116.7*  --  117.4* 113.7*  PLT 115*  --  92* 103*   BMP &GFR Recent Labs  Lab 03/05/23 1207 03/05/23 1217 03/06/23 0347 03/07/23 0351  NA 138 144 138 135  K 4.2 4.3 4.2 3.8  CL 102  --  103 101  CO2 30  --  27 28  GLUCOSE 140*  --  264* 220*  BUN 24*  --  26* 29*  CREATININE 0.74  --  0.90 1.01  CALCIUM 8.0*  --  7.8*  7.7*  MG  --   --   --  1.8  PHOS  --   --   --  2.5   Estimated Creatinine Clearance: 75.2 mL/min (by C-G formula based on SCr of 1.01 mg/dL). Liver & Pancreas: Recent Labs  Lab 03/05/23 1207 03/06/23 0347 03/07/23 0351  AST 91* 94* 87*  ALT 81* 77* 75*  ALKPHOS 477* 435* 367*  BILITOT 1.7* 1.2 1.4*  PROT 8.1 7.3 7.3  ALBUMIN <1.5* <1.5* <1.5*   No results for input(s): "LIPASE", "AMYLASE" in the last 168 hours. No results for input(s): "AMMONIA" in the last 168 hours. Diabetic: No results for input(s): "HGBA1C" in the last 72 hours. Recent Labs  Lab 03/08/23 2042 03/09/23 0030 03/09/23 0428 03/09/23 0807 03/09/23 1152  GLUCAP 172* 165* 127* 132* 159*   Cardiac Enzymes: No results for input(s): "CKTOTAL", "CKMB", "CKMBINDEX", "TROPONINI" in the last 168 hours. Recent Labs    11/05/22 1434  PROBNP 111.0*   Coagulation Profile: Recent Labs  Lab 03/07/23 0351  INR 1.9*   Thyroid Function Tests: No results for input(s): "TSH", "T4TOTAL", "FREET4", "T3FREE", "THYROIDAB" in the last 72 hours. Lipid Profile: No results for  input(s): "CHOL", "HDL", "LDLCALC", "TRIG", "CHOLHDL", "LDLDIRECT" in the last 72 hours. Anemia Panel: No results for input(s): "VITAMINB12", "FOLATE", "FERRITIN", "TIBC", "IRON", "RETICCTPCT" in the last 72 hours. Urine analysis:    Component Value Date/Time   COLORURINE AMBER (A) 12/19/2022 1604   APPEARANCEUR CLEAR 12/19/2022 1604   LABSPEC 1.021 12/19/2022 1604   PHURINE 6.0 12/19/2022 1604   GLUCOSEU NEGATIVE 12/19/2022 1604   HGBUR SMALL (A) 12/19/2022 1604   BILIRUBINUR NEGATIVE 12/19/2022 1604   KETONESUR NEGATIVE 12/19/2022 1604   PROTEINUR NEGATIVE 12/19/2022 1604   UROBILINOGEN 0.2 05/27/2014 1230   NITRITE NEGATIVE 12/19/2022 1604   LEUKOCYTESUR NEGATIVE 12/19/2022 1604   Sepsis Labs: Invalid input(s): "PROCALCITONIN", "LACTICIDVEN"  Microbiology: Recent Results (from the past 240 hours)  Resp panel by RT-PCR (RSV, Flu A&B, Covid) Anterior Nasal Swab     Status: None   Collection Time: 03/05/23 11:40 AM   Specimen: Anterior Nasal Swab  Result Value Ref Range Status   SARS Coronavirus 2 by RT PCR NEGATIVE NEGATIVE Final   Influenza A by PCR NEGATIVE NEGATIVE Final   Influenza B by PCR NEGATIVE NEGATIVE Final    Comment: (NOTE) The Xpert Xpress SARS-CoV-2/FLU/RSV plus assay is intended as an aid in the diagnosis of influenza from Nasopharyngeal swab specimens and should not be used as a sole basis for treatment. Nasal washings and aspirates are unacceptable for Xpert Xpress SARS-CoV-2/FLU/RSV testing.  Fact Sheet for Patients: BloggerCourse.com  Fact Sheet for Healthcare Providers: SeriousBroker.it  This test is not yet approved or cleared by the [place] FDA and has been authorized for detection and/or diagnosis of SARS-CoV-2 by FDA under an Emergency Use Authorization (EUA). This EUA will remain in effect (meaning this test can be used) for the duration of the COVID-19 declaration under Section 564(b)(1)  of the Act, 21 U.S.C. section 360bbb-3(b)(1), unless the authorization is terminated or revoked.     Resp Syncytial Virus by PCR NEGATIVE NEGATIVE Final    Comment: (NOTE) Fact Sheet for Patients: BloggerCourse.com  Fact Sheet for Healthcare Providers: SeriousBroker.it  This test is not yet approved or cleared by the [place] FDA and has been authorized for detection and/or diagnosis of SARS-CoV-2 by FDA under an Emergency Use Authorization (EUA). This EUA will remain in effect (meaning this test can be used)  for the duration of the COVID-19 declaration under Section 564(b)(1) of the Act, 21 U.S.C. section 360bbb-3(b)(1), unless the authorization is terminated or revoked.  Performed at [facility], [address] [place], [place] [address]   Respiratory (~20 pathogens) panel by PCR     Status: None   Collection Time: 03/05/23  9:52 PM   Specimen: Nasopharyngeal Swab; Respiratory  Result Value Ref Range Status   Adenovirus NOT DETECTED NOT DETECTED Final   Coronavirus 229E NOT DETECTED NOT DETECTED Final    Comment: (NOTE) The Coronavirus on the Respiratory Panel, DOES NOT test for the novel  Coronavirus (2019 nCoV)    Coronavirus HKU1 NOT DETECTED NOT DETECTED Final   Coronavirus NL63 NOT DETECTED NOT DETECTED Final   Coronavirus OC43 NOT DETECTED NOT DETECTED Final   Metapneumovirus NOT DETECTED NOT DETECTED Final   Rhinovirus / Enterovirus NOT DETECTED NOT DETECTED Final   Influenza A NOT DETECTED NOT DETECTED Final   Influenza B NOT DETECTED NOT DETECTED Final   Parainfluenza Virus 1 NOT DETECTED NOT DETECTED Final   Parainfluenza Virus 2 NOT DETECTED NOT DETECTED Final   Parainfluenza Virus 3 NOT DETECTED NOT DETECTED Final   Parainfluenza Virus 4 NOT DETECTED NOT DETECTED Final   Respiratory Syncytial Virus NOT DETECTED NOT DETECTED Final   Bordetella pertussis NOT DETECTED NOT DETECTED Final    Bordetella Parapertussis NOT DETECTED NOT DETECTED Final   Chlamydophila pneumoniae NOT DETECTED NOT DETECTED Final   Mycoplasma pneumoniae NOT DETECTED NOT DETECTED Final    Comment: Performed at [facility], [address] [place], [place] [address]  Culture, blood (Routine X 2) w Reflex to ID Panel     Status: None (Preliminary result)   Collection Time: 03/05/23  9:52 PM   Specimen: BLOOD RIGHT ARM  Result Value Ref Range Status   Specimen Description BLOOD RIGHT ARM  Final   Special Requests   Final    BOTTLES DRAWN AEROBIC AND ANAEROBIC Blood Culture results may not be optimal due to an inadequate volume of blood received in culture bottles   Culture   Final    NO GROWTH 4 DAYS Performed at [facility], [address], [place], [place] [address]    Report Status PENDING  Incomplete  Culture, blood (Routine X 2) w Reflex to ID Panel     Status: None (Preliminary result)   Collection Time: 03/05/23  9:53 PM   Specimen: BLOOD LEFT ARM  Result Value Ref Range Status   Specimen Description BLOOD LEFT ARM  Final   Special Requests   Final    BOTTLES DRAWN AEROBIC AND ANAEROBIC Blood Culture adequate volume   Culture   Final    NO GROWTH 4 DAYS Performed at [facility], [address], [place], [place] [address]    Report Status PENDING  Incomplete  MRSA Next Gen by PCR, Nasal     Status: None   Collection Time: 03/06/23 11:20 AM   Specimen: Nasal Mucosa; Nasal Swab  Result Value Ref Range Status   MRSA by PCR Next Gen NOT DETECTED NOT DETECTED Final    Comment: (NOTE) The GeneXpert MRSA Assay (FDA approved for NASAL specimens only), is one component of a comprehensive MRSA colonization surveillance program. It is not intended to diagnose MRSA infection nor to guide or monitor treatment for MRSA infections. Test performance is not FDA approved in patients less than 55 years old. Performed at [facility],  [address], [place], [place] [address]      Radiology Studies: No results found.    Berton Mount, MD [facility]  If 7PM-7AM, please contact night-coverage [email or web address] 03/09/2023, 3:42 PM

## 2023-03-10 ENCOUNTER — Telehealth: Payer: Self-pay | Admitting: Internal Medicine

## 2023-03-10 DIAGNOSIS — J9621 Acute and chronic respiratory failure with hypoxia: Secondary | ICD-10-CM | POA: Diagnosis not present

## 2023-03-10 DIAGNOSIS — J84112 Idiopathic pulmonary fibrosis: Secondary | ICD-10-CM | POA: Diagnosis not present

## 2023-03-10 LAB — CULTURE, BLOOD (ROUTINE X 2)
Culture: NO GROWTH
Culture: NO GROWTH
Special Requests: ADEQUATE

## 2023-03-10 LAB — GLUCOSE, CAPILLARY
Glucose-Capillary: 98 mg/dL (ref 70–99)
Glucose-Capillary: 297 mg/dL — ABNORMAL HIGH (ref 70–99)
Glucose-Capillary: 146 mg/dL — ABNORMAL HIGH (ref 70–99)

## 2023-03-10 MED ORDER — GUAIFENESIN-DM 100-10 MG/5ML PO SYRP: 15.0000 mL | ORAL_SOLUTION | Freq: Three times a day (TID) | ORAL | 0 refills | Status: DC | PRN

## 2023-03-10 MED ORDER — PREDNISONE 20 MG PO TABS: ORAL_TABLET | ORAL | 0 refills | Status: DC

## 2023-03-10 MED ORDER — PREDNISONE 20 MG PO TABS: 20.0000 mg | ORAL_TABLET | Freq: Every day | ORAL | Status: DC

## 2023-03-10 MED ORDER — PREDNISONE 20 MG PO TABS: 40.0000 mg | ORAL_TABLET | Freq: Every day | ORAL | Status: DC

## 2023-03-10 MED ORDER — POLYETHYLENE GLYCOL 3350 17 G PO PACK: 17.0000 g | PACK | Freq: Every day | ORAL | 0 refills | Status: DC | PRN

## 2023-03-10 MED ORDER — PIRFENIDONE 267 MG PO TABS: 534.0000 mg | ORAL_TABLET | Freq: Three times a day (TID) | ORAL | 1 refills | Status: DC

## 2023-03-10 MED ORDER — PREDNISONE 20 MG PO TABS: 60.0000 mg | ORAL_TABLET | Freq: Every day | ORAL | Status: DC

## 2023-03-10 MED ORDER — FUROSEMIDE 40 MG PO TABS
40.0000 mg | ORAL_TABLET | Freq: Every day | ORAL | 1 refills | Status: DC
Start: 2023-03-11 — End: 2023-04-14

## 2023-03-10 MED ORDER — BUDESONIDE 0.5 MG/2ML IN SUSP: 0.5000 mg | Freq: Two times a day (BID) | RESPIRATORY_TRACT | 1 refills | Status: DC

## 2023-03-10 NOTE — Discharge Summary (Signed)
Physician Discharge Summary  Patient ID: Richard Davenport MRN: [id number] DOB/AGE: 1960-04-11 63 y.o.  Admit date: 03/05/2023 Discharge date: 03/10/2023  Admission Diagnoses:  Discharge Diagnoses:  Active Problems:   Cerebral aneurysm, nonruptured   Type 2 diabetes mellitus with hyperglycemia (HCC)   Hyperlipidemia   Hx of basliar aneurysm coil and stent   Pulmonary fibrosis (HCC)   Elevated LFTs   Cirrhosis (HCC)   Lung disease, interstitial (HCC)   Essential hypertension   Acute on chronic respiratory failure with hypoxia (HCC)   Discharged Condition: stable  Hospital Course: Patient is a 63 year old male, with past medical history significant for pulmonary fibrosis/ILD/chronic hypoxic RF on 8 to 10 L, liver cirrhosis, TIA, CVA, cerebral aneurysm s/p coil, DM-2, HTN and HLD sent to ED from pulmonary office due to shortness of breath and hypoxemia to 40% on home 8 to 10 L, and admitted with acute on chronic respiratory failure likely due to progressive pulmonary fibrosis with recurrent exacerbation.  CT angio chest negative for PE but extensive changes of fibrosis, cardiomegaly and cirrhosis.  Viral respiratory panel negative.  Pulmonology team was consulted to assist with patient's management.  Patient was managed with steroids.  Patient was also on diuretics.  Oxygen requirement has been stable.  Pulmonary team has cleared patient for discharge.  Patient will follow-up with pulmonary and critical care team on discharge.  Guarded prognosis.  Palliative care team was consulted during the hospital stay, however, patient and patient's family did not want to proceed with further hospice discussion.    Acute on chronic respiratory failure with hypoxia due to progressive pulmonary fibrosis/ILD: -Patient was sent to ED from pulmonology office due to shortness of breath and hypoxemia (down to 40% on home 8 to 10 L). -Patient required up to 14 L with NRB, but weaned to 8 L by [place].   -Patient  was managed with steroids. -Diuretics were also increased. -Pulmonary and critical care team directed patient's management. -Patient's oxygen requirement is back to baseline. -Pulmonary and critical care team has cleared patient for discharge.     Liver cirrhosis/elevated liver enzymes:  -Looks compensated, but ascites noted. -Ascites improved with diuretics. -Mild elevated liver enzymes.   -Continue home Lasix and Aldactone.    Hypertension:  -Blood pressure is reasonably controlled. -Continue current regimen.     NIDDM-2 with hyperglycemia: A1c 4.9%.  Hyperglycemia likely due to steroid   Hypergammaglobulinemia/inflammatory dermatosis: Followed by allergy and immunology outpatient. -Outpatient follow-up.   History of TIA/CVA/cerebral aneurysm s/p coil. - Noted   Anemia of chronic disease: -Hgb 7.8 (baseline ranges from 8-9). -No overt bleeding. -Continue monitoring     Chronic thrombocytopenia: -Likely due to liver cirrhosis. -Relatively stable -Continue to monitor.     Consults: Pulmonary and critical care team directed patient's care. -Palliative care medicine.  Significant Diagnostic Studies:      Discharge Exam: Blood pressure 124/84, pulse (!) 108, temperature 98.3 F (36.8 C), temperature source Oral, resp. rate 20, height 5\' 8"  (1.727 m), weight 72.6 kg, SpO2 100%.   Disposition: Discharge disposition: 01-Home or Self Care       Discharge Instructions     Diet - low sodium heart healthy   Complete by: As directed    Discharge wound care:   Complete by: As directed    Continue current regimen.   Increase activity slowly   Complete by: As directed       Allergies as of 03/10/2023       Reactions  Pork-derived Products Other (See Comments)   Patient is Muslim and PREFERS TO NOT TAKE ANY PORK OR MEAT PRODUCTS (only fish)        Medication List     STOP taking these medications    mometasone 0.1 % ointment Commonly known as: ELOCON        TAKE these medications    albuterol (2.5 MG/3ML) 0.083% nebulizer solution Commonly known as: PROVENTIL Take 3 mLs (2.5 mg total) by nebulization every 4 (four) hours as needed for wheezing or shortness of breath.   albuterol 108 (90 Base) MCG/ACT inhaler Commonly known as: VENTOLIN HFA Inhale 2 puffs into the lungs every 6 (six) hours as needed for wheezing or shortness of breath.   budesonide 0.5 MG/2ML nebulizer solution Commonly known as: Pulmicort Take 2 mLs (0.5 mg total) by nebulization 2 (two) times daily. What changed: when to take this   furosemide 40 MG tablet Commonly known as: LASIX Take 1 tablet (40 mg total) by mouth daily. Start taking on: March 11, 2023 What changed: how much to take   gabapentin 300 MG capsule Commonly known as: NEURONTIN Take 300 mg by mouth 3 (three) times daily.   guaiFENesin-dextromethorphan 100-10 MG/5ML syrup Commonly known as: ROBITUSSIN DM Take 15 mLs by mouth every 8 (eight) hours as needed for cough.   metFORMIN 500 MG tablet Commonly known as: GLUCOPHAGE Take 1 tablet (500 mg total) by mouth 2 (two) times daily. What changed:  when to take this additional instructions   multivitamin with minerals Tabs tablet Take 1 tablet by mouth daily.   omeprazole 20 MG capsule Commonly known as: PRILOSEC Take 20 mg by mouth daily as needed (for heartburn).   Pirfenidone 267 MG Tabs Take 2 tablets (534 mg total) by mouth with breakfast, with lunch, and with evening meal.   polyethylene glycol 17 g packet Commonly known as: MIRALAX / GLYCOLAX Take 17 g by mouth daily as needed for mild constipation.   predniSONE 20 MG tablet Commonly known as: DELTASONE Take 3 tablets (60 mg total) by mouth daily with breakfast for 7 days, THEN 2 tablets (40 mg total) daily with breakfast for 7 days, THEN 1 tablet (20 mg total) daily with breakfast for 7 days. Start taking on: March 11, 2023 What changed:  medication strength See  the new instructions.   protein supplement shake Liqd Commonly known as: PREMIER PROTEIN Take 2 oz by mouth 2 (two) times daily between meals.   riTUXimab 500 MG/50ML injection Commonly known as: RITUXAN Inject 1,000 mg into the vein every 6 (six) months.   spironolactone 50 MG tablet Commonly known as: ALDACTONE Take 50 mg by mouth daily.               Durable Medical Equipment  (From admission, onward)           Start     Ordered   03/10/23 1134  For home use only DME standard manual wheelchair with seat cushion  Once       Comments: Patient suffers from interstitial lung disease which impairs their ability to perform daily activities like dressing, feeding, grooming, and toileting in the home.  A cane, crutch, or walker will not resolve issue with performing activities of daily living. A wheelchair will allow patient to safely perform daily activities. Patient can safely propel the wheelchair in the home or has a caregiver who can provide assistance. Length of need Lifetime. Accessories: elevating leg rests (ELRs), wheel locks, extensions and  anti-tippers.   03/10/23 1136   03/10/23 1131  For home use only DME 3 n 1  Once        03/10/23 1136              Discharge Care Instructions  (From admission, onward)           Start     Ordered   03/10/23 0000  Discharge wound care:       Comments: Continue current regimen.   03/10/23 1251           Time spent: 35 minutes.  SignedBarnetta Chapel 03/10/2023, 12:51 PM

## 2023-03-10 NOTE — Telephone Encounter (Signed)
2 week f/u tele visit with MR or APP hospital f/u for AE-ILD, would like to discuss DVT ppx

## 2023-03-10 NOTE — Plan of Care (Signed)
  Problem: Education: Goal: Ability to describe self-care measures that may prevent or decrease complications (Diabetes Survival Skills Education) will improve Outcome: Adequate for Discharge Goal: Individualized Educational Video(s) Outcome: Adequate for Discharge   Problem: Coping: Goal: Ability to adjust to condition or change in health will improve Outcome: Adequate for Discharge   Problem: Fluid Volume: Goal: Ability to maintain a balanced intake and output will improve Outcome: Adequate for Discharge   Problem: Health Behavior/Discharge Planning: Goal: Ability to identify and utilize available resources and services will improve Outcome: Adequate for Discharge Goal: Ability to manage health-related needs will improve Outcome: Adequate for Discharge   Problem: Metabolic: Goal: Ability to maintain appropriate glucose levels will improve Outcome: Adequate for Discharge   Problem: Nutritional: Goal: Maintenance of adequate nutrition will improve Outcome: Adequate for Discharge Goal: Progress toward achieving an optimal weight will improve Outcome: Adequate for Discharge   Problem: Skin Integrity: Goal: Risk for impaired skin integrity will decrease Outcome: Adequate for Discharge   Problem: Tissue Perfusion: Goal: Adequacy of tissue perfusion will improve Outcome: Adequate for Discharge   Problem: Education: Goal: Knowledge of General Education information will improve Description: Including pain rating scale, medication(s)/side effects and non-pharmacologic comfort measures Outcome: Adequate for Discharge   Problem: Health Behavior/Discharge Planning: Goal: Ability to manage health-related needs will improve Outcome: Adequate for Discharge   Problem: Clinical Measurements: Goal: Ability to maintain clinical measurements within normal limits will improve Outcome: Adequate for Discharge Goal: Will remain free from infection Outcome: Adequate for Discharge Goal:  Diagnostic test results will improve Outcome: Adequate for Discharge Goal: Respiratory complications will improve Outcome: Adequate for Discharge Goal: Cardiovascular complication will be avoided Outcome: Adequate for Discharge   Problem: Activity: Goal: Risk for activity intolerance will decrease Outcome: Adequate for Discharge   Problem: Nutrition: Goal: Adequate nutrition will be maintained Outcome: Adequate for Discharge   Problem: Coping: Goal: Level of anxiety will decrease Outcome: Adequate for Discharge   Problem: Elimination: Goal: Will not experience complications related to bowel motility Outcome: Adequate for Discharge Goal: Will not experience complications related to urinary retention Outcome: Adequate for Discharge   Problem: Pain Managment: Goal: General experience of comfort will improve and/or be controlled Outcome: Adequate for Discharge   Problem: Safety: Goal: Ability to remain free from injury will improve Outcome: Adequate for Discharge   Problem: Skin Integrity: Goal: Risk for impaired skin integrity will decrease Outcome: Adequate for Discharge   Problem: Acute Rehab PT Goals(only PT should resolve) Goal: Pt Will Go Sit To Supine/Side Outcome: Adequate for Discharge Goal: Patient Will Transfer Sit To/From Stand Outcome: Adequate for Discharge Goal: Pt Will Ambulate Outcome: Adequate for Discharge Goal: Pt Will Go Up/Down Stairs Outcome: Adequate for Discharge

## 2023-03-10 NOTE — Progress Notes (Signed)
PT Cancellation Note  Patient Details Name: Richard Davenport MRN: [id number] DOB: 05-Oct-1960   Cancelled Treatment:    Reason Eval/Treat Not Completed: Other (comment). Pt is going home today and wants to save his energy for trip home.    Angelina Ok [facility] 03/10/2023, 2:00 PM Skip Mayer PT Acute [employer] [phone]

## 2023-03-10 NOTE — Care Management (Cosign Needed)
    Durable Medical Equipment  (From admission, onward)           Start     Ordered   03/10/23 1134  For home use only DME standard manual wheelchair with seat cushion  Once       Comments: Patient suffers from interstitial lung disease which impairs their ability to perform daily activities like dressing, feeding, grooming, and toileting in the home.  A cane, crutch, or walker will not resolve issue with performing activities of daily living. A wheelchair will allow patient to safely perform daily activities. Patient can safely propel the wheelchair in the home or has a caregiver who can provide assistance. Length of need Lifetime. Accessories: elevating leg rests (ELRs), wheel locks, extensions and anti-tippers.   03/10/23 1136   03/10/23 1131  For home use only DME 3 n 1  Once        03/10/23 1136

## 2023-03-10 NOTE — TOC Transition Note (Signed)
Transition of Care [facility]) - Discharge Note   Patient Details  Name: Richard Davenport MRN: [id number] Date of Birth: 1960-04-12  Transition of Care [facility]) CM/SW Contact:  Ronny Bacon, RN Phone Number: 03/10/2023, 11:44 AM   Clinical Narrative:   Patient with possible discharge today. DME WC and 3:1 ordered through [place] with [employer] to be delivered to bedside prior to discharge. Cory with [name] made aware.    Final next level of care: Home w Home Health Services Barriers to Discharge: No Barriers Identified   Patient Goals and CMS Choice            Discharge Placement                       Discharge Plan and Services Additional resources added to the After Visit Summary for                  DME Arranged: Wheelchair manual, 3-N-1 DME Agency: [employer] Date DME Agency Contacted: 03/10/23 Time DME Agency Contacted: [phone] Representative spoke with at DME Agency: Vaughan Basta            Social Drivers of Health (SDOH) Interventions SDOH Screenings   Food Insecurity: No Food Insecurity (03/07/2023)  Housing: Low Risk  (03/07/2023)  Transportation Needs: No Transportation Needs (03/07/2023)  Utilities: Not At Risk (03/07/2023)  Depression (PHQ2-9): Low Risk  (01/01/2023)  Financial Resource Strain: Low Risk  (05/24/2022)   Received from [facility], [facility]  Physical Activity: Not on File (05/10/2021)   Received from [place], [place]  Social Connections: Not on File (10/05/2022)   Received from [facility]  Stress: Not on File (05/10/2021)   Received from [place], [place]  Tobacco Use: Medium Risk (03/05/2023)     Readmission Risk Interventions     No data to display

## 2023-03-10 NOTE — Progress Notes (Signed)
   NAME:  Richard Davenport, MRN:  [id number], DOB:  [birth year], LOS: 5 ADMISSION DATE:  03/05/2023, CONSULTATION DATE: 03/05/2023 REFERRING MD: Leslye Peer, MD, CHIEF COMPLAINT: Interstitial lung disease with exacerbation  History of Present Illness:   63yo M never smoker from [place] with history of GERD, TIA, SAH s/p aneurysm, cirrhosis.  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  Currently on immunosuppression with prednisone 10 mg, rituximab and antifibrotic therapy with pirfenidone.  Was initially on CellCept Has progressive lung disease with recent hospitalization end of January 2025 for acute on chronic hypoxic respiratory failure was treated with IV Solu-Medrol, nebs, antibiotics.  Seen in pulmonary clinic today with sats in the 40s and sent to the ED for admission and further management.  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   Significant Hospital Events: Including procedures, antibiotic start and stop dates in addition to other pertinent events   CTA chest 2/12>>> 1. No evidence of pulmonary embolus. 2. Extensive changes of fibrosis. More confluent opacities noted in the lower lobes, similar prior study which may also reflect chronic fibrotic changes, but difficult to completely exclude superimposed edema or infection. 3. Cardiomegaly. 4. Cirrhosis with upper abdominal ascites, stable.  Interim History / Subjective:  Wants to go home. Daughter at bedside. Remains on  6-7LPM  Objective   Blood pressure 111/69, pulse (!) 111, temperature 98.1 F (36.7 C), temperature source Oral, resp. rate 20, height 5\' 8"  (1.727 m), weight 72.6 kg, SpO2 98%.        Intake/Output Summary (Last 24 hours) at 03/10/2023 1023 Last data filed at 03/10/2023 0850 Gross per 24 hour  Intake 340 ml  Output 900 ml  Net -560 ml   Filed Weights   03/05/23 1128  Weight: 72.6 kg    Physical Exam: No distress +muscle wasting Crackles BL Moves to command Seems oriented  No new labs  Resolved Hospital Problem list     Assessment & Plan:  Interstitial lung disease, diffuse progressive pulmonary fibrosis with recurrent exacerbation Essentially bedbound due to progression of ILD Pulmonary cachexia  - Prednisone taper as ordered - Encourage protein shakes - Family interested in ?SCDs vs. Low dose DoAC for DVT ppx, will route message to Dr. Marchelle Gearing to address as OP - Wean O2 for sats > 90% - DNR but not ready for hospice care yet, anticipate that if he has another setback he is at EOL - Will arrange 2 week f/u in clinic, please reach out if any questions or concerns  Myrla Halsted MD PCCM

## 2023-03-11 ENCOUNTER — Encounter: Payer: Self-pay | Admitting: Internal Medicine

## 2023-03-12 ENCOUNTER — Telehealth: Payer: Self-pay | Admitting: Internal Medicine

## 2023-03-12 NOTE — Telephone Encounter (Signed)
[facility] is needing orders to continue the PT, OT and nursing for this patient since he was discharged from the hospital.

## 2023-03-13 NOTE — Telephone Encounter (Signed)
Wife needs FMLA.  Please advise the patient and route advise accordingly.  I think our office helps with the FMLA form.

## 2023-03-14 NOTE — Telephone Encounter (Signed)
[facility] is needing orders to continue the PT, OT and nursing for this patient since he was discharged from the hospital. [phone]

## 2023-03-14 NOTE — Telephone Encounter (Signed)
MR- can you please advise if ok to give verbal orders for PT and OT with [facility], thank you.

## 2023-03-14 NOTE — Telephone Encounter (Addendum)
Verbal given to [employer] at [place].  See patient message from 2/17 regarding the FMLA.  Nothing further needed.

## 2023-03-14 NOTE — Telephone Encounter (Signed)
No availability with you next week. Where would you like to double book? Virtual or in person?

## 2023-03-14 NOTE — Telephone Encounter (Signed)
Dr. Marchelle Gearing, are you ok with writing a letter that states his wife needed to take time office work to take care of the patient?

## 2023-03-14 NOTE — Telephone Encounter (Signed)
Please write a letter.  I can see him next week   Employer of Wife of Richard Davenport  with Date of Birth : 27-Sep-1960   To whom it may concern  -The above-named patient is seriously ill and under my care with pulmonary fibrosis and respiratory failure.  He also has advanced cirrhosis.  His life expectancy is very diminished.  His wife who is your employee is the main caregiver.  She absolutely needs to take time off to go for his doctor visits and also to care for him.  At this point in time I would say his condition is so bad that her caregiver burden is quite high.  Therefore I would request as an employee that you make reasonable accommodations to allow for her care giving status.  -If you need specifications in more detail I would recommend that you follow FMLA family medical leave act procedures   Please do not hesitate to contact me if you have any questions   Sincerely yours     SIGNATURE    Dr. Kalman Shan, M.D., F.C.C.P,  Pulmonary and Critical Care Medicine Staff Physician, [facility] Center Director - Interstitial Lung Disease  Program  Pulmonary Fibrosis [facility] at [facility] [place], [place], [address]   Pager: [phone], If no answer  -> Check AMION or Try [phone] Telephone (clinical office): [phone] Telephone (research): [phone]  4:22 PM 03/14/2023

## 2023-03-14 NOTE — Telephone Encounter (Signed)
 This is fine

## 2023-03-14 NOTE — Telephone Encounter (Signed)
 Marland Kitchen

## 2023-03-16 ENCOUNTER — Other Ambulatory Visit: Payer: Self-pay

## 2023-03-16 ENCOUNTER — Inpatient Hospital Stay (HOSPITAL_COMMUNITY)
Admission: EM | Admit: 2023-03-16 | Discharge: 2023-03-19 | DRG: 441 | Disposition: A | Discharge status: Home / Self Care | Payer: Medicaid Other | Attending: Internal Medicine | Admitting: Internal Medicine

## 2023-03-16 ENCOUNTER — Emergency Department (HOSPITAL_COMMUNITY): Payer: Medicaid Other

## 2023-03-16 ENCOUNTER — Encounter (HOSPITAL_COMMUNITY): Payer: Self-pay | Admitting: Emergency Medicine

## 2023-03-16 DIAGNOSIS — J9611 Chronic respiratory failure with hypoxia: Secondary | ICD-10-CM | POA: Diagnosis not present

## 2023-03-16 DIAGNOSIS — N3001 Acute cystitis with hematuria: Principal | ICD-10-CM

## 2023-03-16 DIAGNOSIS — E785 Hyperlipidemia, unspecified: Secondary | ICD-10-CM | POA: Diagnosis present

## 2023-03-16 DIAGNOSIS — Z7984 Long term (current) use of oral hypoglycemic drugs: Secondary | ICD-10-CM

## 2023-03-16 DIAGNOSIS — J8410 Pulmonary fibrosis, unspecified: Secondary | ICD-10-CM | POA: Diagnosis not present

## 2023-03-16 DIAGNOSIS — Z79899 Other long term (current) drug therapy: Secondary | ICD-10-CM

## 2023-03-16 DIAGNOSIS — R5381 Other malaise: Secondary | ICD-10-CM | POA: Diagnosis present

## 2023-03-16 DIAGNOSIS — R188 Other ascites: Secondary | ICD-10-CM

## 2023-03-16 DIAGNOSIS — I10 Essential (primary) hypertension: Secondary | ICD-10-CM | POA: Diagnosis present

## 2023-03-16 DIAGNOSIS — Z1152 Encounter for screening for COVID-19: Secondary | ICD-10-CM

## 2023-03-16 DIAGNOSIS — Z91014 Allergy to mammalian meats: Secondary | ICD-10-CM

## 2023-03-16 DIAGNOSIS — K7460 Unspecified cirrhosis of liver: Secondary | ICD-10-CM | POA: Diagnosis not present

## 2023-03-16 DIAGNOSIS — R4182 Altered mental status, unspecified: Secondary | ICD-10-CM | POA: Diagnosis present

## 2023-03-16 DIAGNOSIS — E872 Acidosis, unspecified: Secondary | ICD-10-CM | POA: Diagnosis present

## 2023-03-16 DIAGNOSIS — R410 Disorientation, unspecified: Secondary | ICD-10-CM

## 2023-03-16 DIAGNOSIS — Z7951 Long term (current) use of inhaled steroids: Secondary | ICD-10-CM

## 2023-03-16 DIAGNOSIS — Z6824 Body mass index (BMI) 24.0-24.9, adult: Secondary | ICD-10-CM

## 2023-03-16 DIAGNOSIS — G9341 Metabolic encephalopathy: Secondary | ICD-10-CM | POA: Diagnosis present

## 2023-03-16 DIAGNOSIS — K219 Gastro-esophageal reflux disease without esophagitis: Secondary | ICD-10-CM | POA: Diagnosis present

## 2023-03-16 DIAGNOSIS — D696 Thrombocytopenia, unspecified: Secondary | ICD-10-CM | POA: Diagnosis present

## 2023-03-16 DIAGNOSIS — D689 Coagulation defect, unspecified: Secondary | ICD-10-CM | POA: Diagnosis present

## 2023-03-16 DIAGNOSIS — T380X5A Adverse effect of glucocorticoids and synthetic analogues, initial encounter: Secondary | ICD-10-CM | POA: Diagnosis present

## 2023-03-16 DIAGNOSIS — E1165 Type 2 diabetes mellitus with hyperglycemia: Secondary | ICD-10-CM | POA: Diagnosis present

## 2023-03-16 DIAGNOSIS — Z8249 Family history of ischemic heart disease and other diseases of the circulatory system: Secondary | ICD-10-CM

## 2023-03-16 DIAGNOSIS — Z66 Do not resuscitate: Secondary | ICD-10-CM | POA: Diagnosis present

## 2023-03-16 DIAGNOSIS — K7682 Hepatic encephalopathy: Principal | ICD-10-CM | POA: Diagnosis present

## 2023-03-16 DIAGNOSIS — J84112 Idiopathic pulmonary fibrosis: Secondary | ICD-10-CM | POA: Diagnosis present

## 2023-03-16 DIAGNOSIS — G9340 Encephalopathy, unspecified: Secondary | ICD-10-CM | POA: Diagnosis not present

## 2023-03-16 DIAGNOSIS — E43 Unspecified severe protein-calorie malnutrition: Secondary | ICD-10-CM | POA: Diagnosis present

## 2023-03-16 DIAGNOSIS — Z515 Encounter for palliative care: Secondary | ICD-10-CM

## 2023-03-16 DIAGNOSIS — Z8673 Personal history of transient ischemic attack (TIA), and cerebral infarction without residual deficits: Secondary | ICD-10-CM

## 2023-03-16 LAB — CBC WITH DIFFERENTIAL/PLATELET
Abs Immature Granulocytes: 0.53 10*3/uL — ABNORMAL HIGH (ref 0.00–0.07)
Basophils Absolute: 0 10*3/uL (ref 0.0–0.1)
Basophils Relative: 0 %
Eosinophils Absolute: 0.1 10*3/uL (ref 0.0–0.5)
Eosinophils Relative: 1 %
HCT: 31 % — ABNORMAL LOW (ref 39.0–52.0)
Hemoglobin: 9.8 g/dL — ABNORMAL LOW (ref 13.0–17.0)
Immature Granulocytes: 6 %
Lymphocytes Relative: 5 %
Lymphs Abs: 0.4 10*3/uL — ABNORMAL LOW (ref 0.7–4.0)
MCH: 36.3 pg — ABNORMAL HIGH (ref 26.0–34.0)
MCHC: 31.6 g/dL (ref 30.0–36.0)
MCV: 114.8 fL — ABNORMAL HIGH (ref 80.0–100.0)
Monocytes Absolute: 0.4 10*3/uL (ref 0.1–1.0)
Monocytes Relative: 5 %
Neutro Abs: 7.2 10*3/uL (ref 1.7–7.7)
Neutrophils Relative %: 83 %
Platelets: 91 10*3/uL — ABNORMAL LOW (ref 150–400)
RBC: 2.7 MIL/uL — ABNORMAL LOW (ref 4.22–5.81)
RDW: 19.3 % — ABNORMAL HIGH (ref 11.5–15.5)
Smear Review: DECREASED
WBC: 8.7 10*3/uL (ref 4.0–10.5)
nRBC: 0 % (ref 0.0–0.2)

## 2023-03-16 LAB — I-STAT VENOUS BLOOD GAS, ED
Acid-Base Excess: 8 mmol/L — ABNORMAL HIGH (ref 0.0–2.0)
Bicarbonate: 34.6 mmol/L — ABNORMAL HIGH (ref 20.0–28.0)
Calcium, Ion: 1.16 mmol/L (ref 1.15–1.40)
HCT: 33 % — ABNORMAL LOW (ref 39.0–52.0)
Hemoglobin: 11.2 g/dL — ABNORMAL LOW (ref 13.0–17.0)
O2 Saturation: 99 %
Potassium: 4.4 mmol/L (ref 3.5–5.1)
Sodium: 141 mmol/L (ref 135–145)
TCO2: 36 mmol/L — ABNORMAL HIGH (ref 22–32)
pCO2, Ven: 57.8 mm[Hg] (ref 44–60)
pH, Ven: 7.385 (ref 7.25–7.43)
pO2, Ven: 136 mm[Hg] — ABNORMAL HIGH (ref 32–45)

## 2023-03-16 LAB — URINALYSIS, W/ REFLEX TO CULTURE (INFECTION SUSPECTED)
Bilirubin Urine: NEGATIVE
Glucose, UA: NEGATIVE mg/dL
Ketones, ur: NEGATIVE mg/dL
Leukocytes,Ua: NEGATIVE
Nitrite: NEGATIVE
Protein, ur: NEGATIVE mg/dL
RBC / HPF: >50 RBC/hpf (ref 0–5)
Specific Gravity, Urine: 1.014 (ref 1.005–1.030)
pH: 7 (ref 5.0–8.0)

## 2023-03-16 LAB — COMPREHENSIVE METABOLIC PANEL
ALT: 141 U/L — ABNORMAL HIGH (ref 0–44)
AST: 132 U/L — ABNORMAL HIGH (ref 15–41)
Albumin: <1.5 g/dL — ABNORMAL LOW (ref 3.5–5.0)
Alkaline Phosphatase: 627 U/L — ABNORMAL HIGH (ref 38–126)
Anion gap: 6 (ref 5–15)
BUN: 29 mg/dL — ABNORMAL HIGH (ref 6–20)
CO2: 31 mmol/L (ref 22–32)
Calcium: 7.8 mg/dL — ABNORMAL LOW (ref 8.9–10.3)
Chloride: 100 mmol/L (ref 98–111)
Creatinine, Ser: 0.76 mg/dL (ref 0.61–1.24)
GFR, Estimated: >60 mL/min (ref >60)
Glucose, Bld: 153 mg/dL — ABNORMAL HIGH (ref 70–99)
Potassium: 4.3 mmol/L (ref 3.5–5.1)
Sodium: 137 mmol/L (ref 135–145)
Total Bilirubin: 3.5 mg/dL — ABNORMAL HIGH (ref 0.0–1.2)
Total Protein: 7.6 g/dL (ref 6.5–8.1)

## 2023-03-16 LAB — I-STAT CG4 LACTIC ACID, ED
Lactic Acid, Venous: 2.3 mmol/L (ref 0.5–1.9)
Lactic Acid, Venous: 1.3 mmol/L (ref 0.5–1.9)

## 2023-03-16 LAB — RESP PANEL BY RT-PCR (RSV, FLU A&B, COVID)  RVPGX2
Influenza A by PCR: NEGATIVE
Influenza B by PCR: NEGATIVE
Resp Syncytial Virus by PCR: NEGATIVE
SARS Coronavirus 2 by RT PCR: NEGATIVE

## 2023-03-16 LAB — TROPONIN I (HIGH SENSITIVITY)
Troponin I (High Sensitivity): 19 ng/L — ABNORMAL HIGH (ref <18)
Troponin I (High Sensitivity): 21 ng/L — ABNORMAL HIGH (ref <18)

## 2023-03-16 LAB — AMMONIA: Ammonia: 63 umol/L — ABNORMAL HIGH (ref 9–35)

## 2023-03-16 MED ORDER — SODIUM CHLORIDE 0.9 % IV SOLN
1.0000 g | Freq: Once | INTRAVENOUS | Status: DC
  Administered 2023-03-16: 1 g via INTRAVENOUS
  Filled 2023-03-16: qty 10

## 2023-03-16 NOTE — ED Notes (Signed)
 Patient transported to MRI with oxygen.

## 2023-03-16 NOTE — ED Provider Notes (Signed)
 [place] EMERGENCY DEPARTMENT AT [facility] Provider Note   CSN: [id number] Arrival date & time: 03/16/23  1249     History  Chief Complaint  Patient presents with   Shortness of Breath    Richard Davenport is a 63 y.o. male.  Pt is a 63 year old male, with past medical history significant for pulmonary fibrosis/ILD/chronic hypoxic RF on 8 to 10 L, liver cirrhosis, TIA, CVA, cerebral aneurysm s/p coil, DM-2, HTN and HLD who was discharged from the hospital on 03/10/2023 after having an exacerbation of his pulmonary fibrosis which responded to steroids and nebulizers who is returning again from home due to worsening shortness of breath in the last 2 days and increased cough.  He denies fever or productive cough.  He is still using all his medications at home and on oral steroids.  He has not noticed increased swelling in his abdomen or legs.  He denies any chest or abdominal pain.  No vomiting.  The history is provided by the patient, medical records and a relative.  Shortness of Breath      Home Medications Prior to Admission medications   Medication Sig Start Date End Date Taking? Authorizing Provider  albuterol (PROVENTIL) (2.5 MG/3ML) 0.083% nebulizer solution Take 3 mLs (2.5 mg total) by nebulization every 4 (four) hours as needed for wheezing or shortness of breath. 01/07/23   Kozlow, Alvira Philips, MD  albuterol (VENTOLIN HFA) 108 (90 Base) MCG/ACT inhaler Inhale 2 puffs into the lungs every 6 (six) hours as needed for wheezing or shortness of breath. 01/07/23   Kozlow, Alvira Philips, MD  budesonide (PULMICORT) 0.5 MG/2ML nebulizer solution Take 2 mLs (0.5 mg total) by nebulization 2 (two) times daily. 03/10/23 05/09/23  Berton Mount I, MD  furosemide (LASIX) 40 MG tablet Take 1 tablet (40 mg total) by mouth daily. 03/11/23   Barnetta Chapel, MD  gabapentin (NEURONTIN) 300 MG capsule Take 300 mg by mouth 3 (three) times daily. 08/28/22   [provider]   guaiFENesin-dextromethorphan (ROBITUSSIN DM) 100-10 MG/5ML syrup Take 15 mLs by mouth every 8 (eight) hours as needed for cough. 03/10/23   Barnetta Chapel, MD  metFORMIN (GLUCOPHAGE) 500 MG tablet Take 1 tablet (500 mg total) by mouth 2 (two) times daily. Patient taking differently: Take 500 mg by mouth See admin instructions. Take 1 tablet by mouth once daily. May take a twice daily if sugar is high. 12/14/17   Linwood Dibbles, MD  Multiple Vitamin (MULTIVITAMIN WITH MINERALS) TABS tablet Take 1 tablet by mouth daily.    [provider]  omeprazole (PRILOSEC) 20 MG capsule Take 20 mg by mouth daily as needed (for heartburn).    [provider]  Pirfenidone 267 MG TABS Take 2 tablets (534 mg total) by mouth with breakfast, with lunch, and with evening meal. 03/10/23 05/09/23  Berton Mount I, MD  polyethylene glycol (MIRALAX / GLYCOLAX) 17 g packet Take 17 g by mouth daily as needed for mild constipation. 03/10/23   Barnetta Chapel, MD  predniSONE (DELTASONE) 20 MG tablet Take 3 tablets (60 mg total) by mouth daily with breakfast for 7 days, THEN 2 tablets (40 mg total) daily with breakfast for 7 days, THEN 1 tablet (20 mg total) daily with breakfast for 7 days. 03/11/23 04/01/23  Berton Mount I, MD  protein supplement shake (PREMIER PROTEIN) LIQD Take 2 oz by mouth 2 (two) times daily between meals.    [provider]  riTUXimab (RITUXAN) 500  MG/50ML injection Inject 1,000 mg into the vein every 6 (six) months.    [provider]  spironolactone (ALDACTONE) 50 MG tablet Take 50 mg by mouth daily. 05/06/22 05/06/23  [provider]      Allergies    Pork-derived products    Review of Systems   Review of Systems  Respiratory:  Positive for shortness of breath.     Physical Exam Updated Vital Signs BP (!) 134/90   Pulse (!) 118   Temp 98 F (36.7 C) (Axillary)   Resp (!) 26   Ht 5\' 8"  (1.727 m)   Wt 72.5 kg   SpO2 100%   BMI 24.30 kg/m   Physical Exam Vitals and nursing note reviewed.  Constitutional:      General: He is not in acute distress.    Appearance: He is well-developed. He is ill-appearing.  HENT:     Head: Normocephalic and atraumatic.  Eyes:     Conjunctiva/sclera: Conjunctivae normal.     Pupils: Pupils are equal, round, and reactive to light.  Cardiovascular:     Rate and Rhythm: Normal rate and regular rhythm.     Heart sounds: No murmur heard. Pulmonary:     Effort: Pulmonary effort is normal. Tachypnea present. No respiratory distress.     Breath sounds: Normal breath sounds. No wheezing.     Comments: Fine crackles throughout all breath fields Abdominal:     General: There is distension.     Palpations: Abdomen is soft. There is shifting dullness and fluid wave.     Tenderness: There is no abdominal tenderness. There is no guarding or rebound.  Musculoskeletal:        General: No tenderness. Normal range of motion.     Cervical back: Normal range of motion and neck supple.     Right lower leg: No edema.     Left lower leg: No edema.  Skin:    General: Skin is warm and dry.     Coloration: Skin is pale.     Findings: No erythema or rash.  Neurological:     Mental Status: He is alert and oriented to person, place, and time.  Psychiatric:        Behavior: Behavior normal.     ED Results / Procedures / Treatments   Labs (all labs ordered are listed, but only abnormal results are displayed) Labs Reviewed  CBC WITH DIFFERENTIAL/PLATELET - Abnormal; Notable for the following components:      Result Value   RBC 2.70 (*)    Hemoglobin 9.8 (*)    HCT 31.0 (*)    MCV 114.8 (*)    MCH 36.3 (*)    RDW 19.3 (*)    Platelets 91 (*)    Lymphs Abs 0.4 (*)    Abs Immature Granulocytes 0.53 (*)    All other components within normal limits  COMPREHENSIVE METABOLIC PANEL - Abnormal; Notable for the following components:   Glucose, Bld 153 (*)    BUN 29 (*)    Calcium 7.8 (*)    Albumin <1.5 (*)     AST 132 (*)    ALT 141 (*)    Alkaline Phosphatase 627 (*)    Total Bilirubin 3.5 (*)    All other components within normal limits  I-STAT VENOUS BLOOD GAS, ED - Abnormal; Notable for the following components:   pO2, Ven 136 (*)    Bicarbonate 34.6 (*)    TCO2 36 (*)  Acid-Base Excess 8.0 (*)    HCT 33.0 (*)    Hemoglobin 11.2 (*)    All other components within normal limits  I-STAT CG4 LACTIC ACID, ED - Abnormal; Notable for the following components:   Lactic Acid, Venous 2.3 (*)    All other components within normal limits  TROPONIN I (HIGH SENSITIVITY) - Abnormal; Notable for the following components:   Troponin I (High Sensitivity) 19 (*)    All other components within normal limits  RESP PANEL BY RT-PCR (RSV, FLU A&B, COVID)  [id number]    EKG EKG Interpretation Date/Time:  Sunday March 16 2023 12:56:30 EST Ventricular Rate:  124 PR Interval:    QRS Duration:  89 QT Interval:  322 QTC Calculation: 463 R Axis:   -29  Text Interpretation: Sinus tachycardia Left ventricular hypertrophy Anterior Q waves, possibly due to LVH No significant change since last tracing Confirmed by Gwyneth Sprout ([id number]) on 03/16/2023 2:43:41 PM  Radiology DG Chest Port 1 View Result Date: 03/16/2023 CLINICAL DATA:  Shortness of breath EXAM: PORTABLE CHEST 1 VIEW COMPARISON:  Chest radiograph and CTA chest dated 03/05/2023 FINDINGS: Further decrease in low lung volumes with bronchovascular crowding. Increased asymmetric left lung interstitial and dense opacity. No pleural effusion or pneumothorax. Similar cardiomediastinal silhouette. No acute osseous abnormality. Implanted loop recorder is again seen, now projecting over the midline lower mediastinum. IMPRESSION: Further decrease in low lung volumes with bronchovascular crowding. Increased asymmetric left lung interstitial and dense opacity, which may represent a combination of pulmonary fibrosis, atelectasis, aspiration, or pneumonia.  Electronically Signed   By: Agustin Cree M.D.   On: 03/16/2023 14:58    Procedures Procedures    Medications Ordered in ED Medications - No data to display  ED Course/ Medical Decision Making/ A&P                                 Medical Decision Making Amount and/or Complexity of Data Reviewed Labs: ordered. Radiology: ordered.   Pt with multiple medical problems and comorbidities and presenting today with a complaint that caries a high risk for morbidity and mortality.  Here today with worsening shortness of breath.  Concern for recurrent pulmonary fibrosis exacerbation versus infectious etiology versus fluid overload with his history also of cirrhosis.  Patient does have some ascites on exam but it is not tense and did not feel that his the cause of worsening shortness of breath.  Low suspicion for PE as patient has recently had a CT that showed no evidence of PE and has no unilateral leg pain or swelling.  Also possibility for ACS, anemia.  I independently interpreted patient's EKG and labs.  Patient has persistent sinus tachycardia without other dysrhythmia or new ST changes.  Lactic acid is mildly elevated at 2.3, venous blood glass within normal limits, CBC with stable hemoglobin of 9.8 and a normal white count, CMP with normal renal function and electrolytes but increasing LFTs now AST of 132 ALT of 141 and total bilirubin of 3.5 from 1.9 last week, troponin minimally elevated at 19.  I have independently visualized and interpreted pt's images today.  Chest x-ray is a poor inspiratory film with significant chronic changes bilaterally.  Radiology reports further decrease in lung volumes with bronchovascular crowding increased asymmetry in the left lung which could represent pulmonary fibrosis atelectasis aspiration or pneumonia.  3:31 PM Patient's family member has now arrived and reports that they did  not bring him here because of concern for shortness of breath they feel that his  breathing is about at baseline but their concern was for altered mental status.  He occasionally does become confused but she reports that around 3 AM this morning he had complained of some abdominal pain and shoulder pain and his wife felt that he was confused.  However this morning he would not eat he could not figure out how to use a straw and seemed much more confused than his baseline which has improved some now.  She was concerned he may have had a stroke.  He has not had any vomiting or fever.  Also concern other than stroke for hepatic encephalopathy.  Ammonia and head CT are pending.          Final Clinical Impression(s) / ED Diagnoses Final diagnoses:  None    Rx / DC Orders ED Discharge Orders     None         Gwyneth Sprout, MD 03/16/23 [phone]

## 2023-03-16 NOTE — ED Provider Notes (Signed)
 Patient signed out to me at 1530 by Dr. Anitra Lauth pending head CT and ammonia level.  In short this is a 63 year old male with a past medical history of severe pulmonary hide fibrosis on 8 to 10 L home O2, cirrhosis, CVA, cerebral aneurysm status post coiling, hypertension and diabetes that presented to the emergency department with complaints of shortness of breath and per family concern for intermittent episodes of confusion.  Patient was tachycardic on arrival otherwise stable on his home O2.  His workup showed no acute ischemic changes on EKG and minimally elevated troponin.  He did have an initial mildly elevated lactic and mildly worsening liver enzymes.  Chest x-ray with evidence of pulmonary fibrosis, low suspicion for acute pneumonia without any white blood cell count and no fever or worsening hypoxia from his baseline.  Family is concerned for intermittent episodes of confusion and complaining of some abdominal and shoulder pain and seemed unaware of how to use this drawl.  His mental status appears to be at baseline at this time.  Clinical Course as of 03/16/23 2321  Sun Mar 16, 2023  1758 Ammonia mildly elevated but appears to be approximately at baseline making hepatic encephalopathy less likely. Repeat troponin is flat. CTH with likely new age indeterminate infarct, stable appearing stent and coil. Family reports he appears to have pain with urination and his confusion mostly occurs when trying to urinate. Will add on UA. [VK]  2158 No acute abnormality on MRI. Urine is pending. [VK]  2305 Rare bacteria, but large RBCs, with pain with urination would recommend treatment for UTI. The daughter reports he is not back to his neurologic baseline and is still only intermittently recognizing her and asking her questions such as when is she going to school and she is not in school.  Plan will be for IV antibiotics and admission for his altered mental status in the setting of UTI. [VK]    Clinical Course  User Index [VK] Rexford Maus, DO      Rexford Maus, [place] 03/16/23 2321

## 2023-03-16 NOTE — ED Triage Notes (Signed)
 Pt BIB [facility] due to generalized malaise and cough for the past two days.  Pt became SHOB at home today but SpO2 at 100% 8L of oxygen nasal cannula.  Pt normally uses 8-10L nasal cannula due to pulmonary fibrosis.  Pt does have distended abdomen.   EMS VS BP 132/94, HR 122, SpO2 100% NRB at 8L oxygen.  CBG 135.

## 2023-03-16 NOTE — ED Notes (Signed)
 Provided visitor with ice water per request. Reminded visitors pt is NPO at this time, visitor verbalized understanding.

## 2023-03-17 ENCOUNTER — Encounter (HOSPITAL_COMMUNITY): Payer: Self-pay | Admitting: Family Medicine

## 2023-03-17 DIAGNOSIS — K7460 Unspecified cirrhosis of liver: Secondary | ICD-10-CM | POA: Diagnosis present

## 2023-03-17 DIAGNOSIS — G9340 Encephalopathy, unspecified: Secondary | ICD-10-CM | POA: Diagnosis not present

## 2023-03-17 DIAGNOSIS — Z1152 Encounter for screening for COVID-19: Secondary | ICD-10-CM | POA: Diagnosis not present

## 2023-03-17 DIAGNOSIS — I10 Essential (primary) hypertension: Secondary | ICD-10-CM | POA: Diagnosis present

## 2023-03-17 DIAGNOSIS — E1165 Type 2 diabetes mellitus with hyperglycemia: Secondary | ICD-10-CM | POA: Diagnosis present

## 2023-03-17 DIAGNOSIS — Z8673 Personal history of transient ischemic attack (TIA), and cerebral infarction without residual deficits: Secondary | ICD-10-CM | POA: Diagnosis not present

## 2023-03-17 DIAGNOSIS — Z7984 Long term (current) use of oral hypoglycemic drugs: Secondary | ICD-10-CM | POA: Diagnosis not present

## 2023-03-17 DIAGNOSIS — J8410 Pulmonary fibrosis, unspecified: Secondary | ICD-10-CM | POA: Diagnosis not present

## 2023-03-17 DIAGNOSIS — G9341 Metabolic encephalopathy: Secondary | ICD-10-CM | POA: Diagnosis present

## 2023-03-17 DIAGNOSIS — K7682 Hepatic encephalopathy: Secondary | ICD-10-CM | POA: Diagnosis present

## 2023-03-17 DIAGNOSIS — D696 Thrombocytopenia, unspecified: Secondary | ICD-10-CM | POA: Diagnosis present

## 2023-03-17 DIAGNOSIS — Z79899 Other long term (current) drug therapy: Secondary | ICD-10-CM | POA: Diagnosis not present

## 2023-03-17 DIAGNOSIS — Z515 Encounter for palliative care: Secondary | ICD-10-CM | POA: Diagnosis not present

## 2023-03-17 DIAGNOSIS — R4182 Altered mental status, unspecified: Secondary | ICD-10-CM | POA: Diagnosis present

## 2023-03-17 DIAGNOSIS — D689 Coagulation defect, unspecified: Secondary | ICD-10-CM | POA: Diagnosis present

## 2023-03-17 DIAGNOSIS — Z7951 Long term (current) use of inhaled steroids: Secondary | ICD-10-CM | POA: Diagnosis not present

## 2023-03-17 DIAGNOSIS — J9611 Chronic respiratory failure with hypoxia: Secondary | ICD-10-CM | POA: Diagnosis present

## 2023-03-17 DIAGNOSIS — Z66 Do not resuscitate: Secondary | ICD-10-CM | POA: Diagnosis present

## 2023-03-17 DIAGNOSIS — K219 Gastro-esophageal reflux disease without esophagitis: Secondary | ICD-10-CM | POA: Diagnosis present

## 2023-03-17 DIAGNOSIS — J84112 Idiopathic pulmonary fibrosis: Secondary | ICD-10-CM | POA: Diagnosis present

## 2023-03-17 DIAGNOSIS — E43 Unspecified severe protein-calorie malnutrition: Secondary | ICD-10-CM | POA: Diagnosis present

## 2023-03-17 DIAGNOSIS — T380X5A Adverse effect of glucocorticoids and synthetic analogues, initial encounter: Secondary | ICD-10-CM | POA: Diagnosis present

## 2023-03-17 DIAGNOSIS — Z8249 Family history of ischemic heart disease and other diseases of the circulatory system: Secondary | ICD-10-CM | POA: Diagnosis not present

## 2023-03-17 DIAGNOSIS — E872 Acidosis, unspecified: Secondary | ICD-10-CM | POA: Diagnosis present

## 2023-03-17 DIAGNOSIS — E785 Hyperlipidemia, unspecified: Secondary | ICD-10-CM | POA: Diagnosis present

## 2023-03-17 DIAGNOSIS — Z91014 Allergy to mammalian meats: Secondary | ICD-10-CM | POA: Diagnosis not present

## 2023-03-17 DIAGNOSIS — R5381 Other malaise: Secondary | ICD-10-CM | POA: Diagnosis present

## 2023-03-17 LAB — COMPREHENSIVE METABOLIC PANEL
ALT: 119 U/L — ABNORMAL HIGH (ref 0–44)
AST: 120 U/L — ABNORMAL HIGH (ref 15–41)
Albumin: <1.5 g/dL — ABNORMAL LOW (ref 3.5–5.0)
Alkaline Phosphatase: 507 U/L — ABNORMAL HIGH (ref 38–126)
Anion gap: 3 — ABNORMAL LOW (ref 5–15)
BUN: 24 mg/dL — ABNORMAL HIGH (ref 6–20)
CO2: 32 mmol/L (ref 22–32)
Calcium: 7.6 mg/dL — ABNORMAL LOW (ref 8.9–10.3)
Chloride: 102 mmol/L (ref 98–111)
Creatinine, Ser: 0.82 mg/dL (ref 0.61–1.24)
GFR, Estimated: >60 mL/min (ref >60)
Glucose, Bld: 146 mg/dL — ABNORMAL HIGH (ref 70–99)
Potassium: 4.3 mmol/L (ref 3.5–5.1)
Sodium: 137 mmol/L (ref 135–145)
Total Bilirubin: 2.8 mg/dL — ABNORMAL HIGH (ref 0.0–1.2)
Total Protein: 6.6 g/dL (ref 6.5–8.1)

## 2023-03-17 LAB — CBC
HCT: 26.9 % — ABNORMAL LOW (ref 39.0–52.0)
Hemoglobin: 8.8 g/dL — ABNORMAL LOW (ref 13.0–17.0)
MCH: 37.1 pg — ABNORMAL HIGH (ref 26.0–34.0)
MCHC: 32.7 g/dL (ref 30.0–36.0)
MCV: 113.5 fL — ABNORMAL HIGH (ref 80.0–100.0)
Platelets: 79 10*3/uL — ABNORMAL LOW (ref 150–400)
RBC: 2.37 MIL/uL — ABNORMAL LOW (ref 4.22–5.81)
RDW: 19.6 % — ABNORMAL HIGH (ref 11.5–15.5)
WBC: 8.6 10*3/uL (ref 4.0–10.5)
nRBC: 0 % (ref 0.0–0.2)

## 2023-03-17 LAB — VITAMIN B12: Vitamin B-12: 1595 pg/mL — ABNORMAL HIGH (ref 180–914)

## 2023-03-17 LAB — TSH: TSH: 6.647 u[IU]/mL — ABNORMAL HIGH (ref 0.350–4.500)

## 2023-03-17 LAB — RPR: RPR Ser Ql: NONREACTIVE

## 2023-03-17 LAB — CBG MONITORING, ED: Glucose-Capillary: 263 mg/dL — ABNORMAL HIGH (ref 70–99)

## 2023-03-17 LAB — PROTIME-INR
INR: 1.8 — ABNORMAL HIGH (ref 0.8–1.2)
Prothrombin Time: 20.9 s — ABNORMAL HIGH (ref 11.4–15.2)

## 2023-03-17 MED ORDER — ALBUTEROL SULFATE (2.5 MG/3ML) 0.083% IN NEBU: 2.5000 mg | INHALATION_SOLUTION | RESPIRATORY_TRACT | Status: DC | PRN

## 2023-03-17 MED ORDER — PREDNISONE 20 MG PO TABS: 20.0000 mg | ORAL_TABLET | Freq: Every day | ORAL | Status: DC

## 2023-03-17 MED ORDER — FUROSEMIDE 40 MG PO TABS
40.0000 mg | ORAL_TABLET | Freq: Every day | ORAL | Status: DC
  Administered 2023-03-17 – 2023-03-19 (×3): 40 mg via ORAL
  Filled 2023-03-17: qty 1
  Filled 2023-03-17 (×2): qty 2

## 2023-03-17 MED ORDER — LACTULOSE 10 GM/15ML PO SOLN
20.0000 g | Freq: Two times a day (BID) | ORAL | Status: DC
  Administered 2023-03-17 – 2023-03-19 (×4): 20 g via ORAL
  Filled 2023-03-17 (×4): qty 30

## 2023-03-17 MED ORDER — PREDNISONE 20 MG PO TABS
40.0000 mg | ORAL_TABLET | Freq: Every day | ORAL | Status: DC
  Administered 2023-03-18 – 2023-03-19 (×2): 40 mg via ORAL
  Filled 2023-03-17 (×2): qty 2

## 2023-03-17 MED ORDER — ACETAMINOPHEN 650 MG RE SUPP: 650.0000 mg | Freq: Four times a day (QID) | RECTAL | Status: DC | PRN

## 2023-03-17 MED ORDER — SPIRONOLACTONE 25 MG PO TABS
50.0000 mg | ORAL_TABLET | Freq: Every day | ORAL | Status: DC
  Administered 2023-03-17 – 2023-03-19 (×3): 50 mg via ORAL
  Filled 2023-03-17: qty 2
  Filled 2023-03-17 (×2): qty 4
  Filled 2023-03-17: qty 2

## 2023-03-17 MED ORDER — ONDANSETRON HCL 4 MG PO TABS: 4.0000 mg | ORAL_TABLET | Freq: Four times a day (QID) | ORAL | Status: DC | PRN

## 2023-03-17 MED ORDER — POLYETHYLENE GLYCOL 3350 17 G PO PACK: 17.0000 g | PACK | Freq: Every day | ORAL | Status: DC | PRN

## 2023-03-17 MED ORDER — SODIUM CHLORIDE 0.9 % IV SOLN
1.0000 g | INTRAVENOUS | Status: DC
  Administered 2023-03-17 – 2023-03-18 (×2): 1 g via INTRAVENOUS
  Filled 2023-03-17 (×2): qty 10

## 2023-03-17 MED ORDER — ENSURE MAX PROTEIN PO LIQD
11.0000 [oz_av] | Freq: Two times a day (BID) | ORAL | Status: DC
  Administered 2023-03-17 – 2023-03-19 (×2): 11 [oz_av] via ORAL
  Filled 2023-03-17 (×5): qty 330

## 2023-03-17 MED ORDER — ACETAMINOPHEN 325 MG PO TABS: 650.0000 mg | ORAL_TABLET | Freq: Four times a day (QID) | ORAL | Status: DC | PRN

## 2023-03-17 MED ORDER — ONDANSETRON HCL 4 MG/2ML IJ SOLN: 4.0000 mg | Freq: Four times a day (QID) | INTRAMUSCULAR | Status: DC | PRN

## 2023-03-17 MED ORDER — SODIUM CHLORIDE 0.9% FLUSH
3.0000 mL | Freq: Two times a day (BID) | INTRAVENOUS | Status: DC
  Administered 2023-03-17 – 2023-03-19 (×6): 3 mL via INTRAVENOUS

## 2023-03-17 MED ORDER — PREDNISONE 20 MG PO TABS
60.0000 mg | ORAL_TABLET | Freq: Every day | ORAL | Status: AC
  Administered 2023-03-17: 60 mg via ORAL
  Filled 2023-03-17: qty 3

## 2023-03-17 MED ORDER — BUDESONIDE 0.5 MG/2ML IN SUSP
0.5000 mg | Freq: Two times a day (BID) | RESPIRATORY_TRACT | Status: DC
  Administered 2023-03-17 – 2023-03-19 (×5): 0.5 mg via RESPIRATORY_TRACT
  Filled 2023-03-17 (×5): qty 2

## 2023-03-17 NOTE — Progress Notes (Signed)
 PROGRESS NOTE  Richard Davenport UJW:[id number] DOB: [birth year] DOA: 03/16/2023 PCP: Kalman Shan, MD   LOS: 0 days   Brief narrative:   Richard Davenport is a 63 y.o. male with past medical history significant for hypertension, type 2 diabetes mellitus, cirrhosis, pulmonary fibrosis/ILD, chronic hypoxic respiratory failure, history of CVA, cerebral aneurysm status post coiling, and recent admission with acute on chronic hypoxic respiratory failure presented to hospital with altered mental status on 03/15/2023.  At baseline he does have mild confusion but on presentation he was confused the whole day which was not usual for him.  His work of breathing was baseline as per the daughter and had been on 6 L/min recently from the 8 to 10 L/min that he had been on previously.  Patient reported shortness of breath and cough which is at his baseline.  In the ED patient was afebrile on 8 L of oxygen but was tachypneic tachycardic.  EKG showed sinus tachycardia.  Labs showed AST elevation at 132 with ALT of 141 and total bilirubin 3.5.  Lactate was elevated at 2.3.  CT head scan and MRI did not reveal any evidence of stroke.  Patient was then considered for admission to hospital for further evaluation and treatment.    Assessment/Plan: Principal Problem:   Acute encephalopathy Active Problems:   Chronic respiratory failure with hypoxia (HCC) - 3L/min   Pulmonary fibrosis (HCC)   Thrombocytopenia (HCC)   Cirrhosis (HCC)   Protein-calorie malnutrition, severe    Acute metabolic encephalopathy  Could be multifactorial from medication induced, cirrhosis of liver, hypoxia, possible UTI with possible superimposed lung infection.. ABG without hypercarbia.  MRI brain negative for acute findings.  Vitamin B12 elevated at 1595.  TSH slightly elevated at 6.6.  Did not have fever or leukocytosis.  Urinalysis was negative for infection.  Ammonia was slightly elevated at 63.  Influenza and RSV was negative.   RPR nonreactive.  Gabapentin on hold.  Delirium precautions.  Continue to monitor closely.  Will add lactulose for adequate bowel movements.  Avoid sedative hypnotics for now.  Encourage oral hydration.  Empiric IV Rocephin for now.  Fall and aspiration precaution.   Progressive pulmonary fibrosis; chronic hypoxic respiratory failure  -Chest x-ray showed decreasing lung volumes with bronchovascular crowding.  Continue supplemental O2, prednisone taper, inhalers, supportive care  Hold pirfenidone while trending LFTs.  Patient follows up with Dr. Marchelle Gearing as outpatient.  On prednisone taper.  Will continue to reduce the dose.   Cirrhosis of liver with coagulopathy.. Elevation of liver enzymes with INR elevation noted.  Add lactulose.  Monitor CMP.   Anemia; thrombocytopenia  Hemoglobin of 8.8 today from 11.2 yesterday.  No overt signs of bleeding.  Platelets at down to 79..   Protein-calorie malnutrition   on protein supplementation..  Will get nutrition evaluation.   DVT prophylaxis: SCDs Start: 03/17/23 0203   Disposition: Likely home in 1 to 2 days  Status is: Observation The patient will require care spanning > 2 midnights and should be moved to inpatient because: Pending clinical improvement, altered mental status,    Code Status:     Code Status: Limited: Do not attempt resuscitation (DNR) -DNR-LIMITED -Do Not Intubate/DNI   Family Communication: Spoke with the patient's daughter at bedside at length.  Consultants: None  Procedures: None  Anti-infectives:  Rocephin IV  Anti-infectives (From admission, onward)    Start     Dose/Rate Route Frequency Ordered Stop   03/16/23 2330  cefTRIAXone (ROCEPHIN) 1 g  in sodium chloride 0.9 % 100 mL IVPB  Status:  Discontinued        1 g 200 mL/hr over 30 Minutes Intravenous  Once 03/16/23 2320 03/16/23 2358        Subjective: Today, patient was seen and examined at bedside.  Patient's daughter at bedside who stated that  breathing is at his baseline but he keeps on getting confused and not by himself.  He did have some baseline confusion after his stroke in the past but this is completely different as per the patient's daughter at bedside.  Objective: Vitals:   03/17/23 0847 03/17/23 0946  BP: 116/80   Pulse: 91   Resp: (!) 23   Temp:  98.1 F (36.7 C)  SpO2: 100%     Intake/Output Summary (Last 24 hours) at 03/17/2023 1140 Last data filed at 03/16/2023 2358 Gross per 24 hour  Intake 100 ml  Output --  Net 100 ml   Filed Weights   03/16/23 1253  Weight: 72.5 kg   Body mass index is 24.3 kg/m.   Physical Exam:  GENERAL: Patient is alert awake Communicative, confused and disoriented at times, not in obvious distress.  On nasal cannula oxygen. HENT: No scleral pallor or icterus. Pupils equally reactive to light. Oral mucosa is moist NECK: is supple, no gross swelling noted. CHEST: Decreased breath sounds bilaterally, coarse breath sounds noted CVS: S1 and S2 heard, no murmur. Regular rate and rhythm.  ABDOMEN: Soft, non-tender, bowel sounds are present. EXTREMITIES: No edema. CNS: Cranial nerves are intact.  Moves extremities. SKIN: warm and dry without rashes.  Data Review: I have personally reviewed the following laboratory data and studies,  CBC: Recent Labs  Lab 03/16/23 1307 03/16/23 1415 03/17/23 0541  WBC 8.7  --  8.6  NEUTROABS 7.2  --   --   HGB 9.8* 11.2* 8.8*  HCT 31.0* 33.0* 26.9*  MCV 114.8*  --  113.5*  PLT 91*  --  79*   Basic Metabolic Panel: Recent Labs  Lab 03/16/23 1307 03/16/23 1415 03/17/23 0541  NA 137 141 137  K 4.3 4.4 4.3  CL 100  --  102  CO2 31  --  32  GLUCOSE 153*  --  146*  BUN 29*  --  24*  CREATININE 0.76  --  0.82  CALCIUM 7.8*  --  7.6*   Liver Function Tests: Recent Labs  Lab 03/16/23 1307 03/17/23 0541  AST 132* 120*  ALT 141* 119*  ALKPHOS 627* 507*  BILITOT 3.5* 2.8*  PROT 7.6 6.6  ALBUMIN <1.5* <1.5*   No results for  input(s): "LIPASE", "AMYLASE" in the last 168 hours. Recent Labs  Lab 03/16/23 1537  AMMONIA 63*   Cardiac Enzymes: No results for input(s): "CKTOTAL", "CKMB", "CKMBINDEX", "TROPONINI" in the last 168 hours. BNP (last 3 results) Recent Labs    02/11/23 2028 03/08/23 0323  BNP 101.4* 155.5*    ProBNP (last 3 results) Recent Labs    11/05/22 1434  PROBNP 111.0*    CBG: Recent Labs  Lab 03/10/23 1154  GLUCAP 146*   Recent Results (from the past 240 hours)  Resp panel by RT-PCR (RSV, Flu A&B, Covid) Anterior Nasal Swab     Status: None   Collection Time: 03/16/23  2:03 PM   Specimen: Anterior Nasal Swab  Result Value Ref Range Status   SARS Coronavirus 2 by RT PCR NEGATIVE NEGATIVE Final   Influenza A by PCR NEGATIVE NEGATIVE Final  Influenza B by PCR NEGATIVE NEGATIVE Final    Comment: (NOTE) The Xpert Xpress SARS-CoV-2/FLU/RSV plus assay is intended as an aid in the diagnosis of influenza from Nasopharyngeal swab specimens and should not be used as a sole basis for treatment. Nasal washings and aspirates are unacceptable for Xpert Xpress SARS-CoV-2/FLU/RSV testing.  Fact Sheet for Patients: BloggerCourse.com  Fact Sheet for Healthcare Providers: SeriousBroker.it  This test is not yet approved or cleared by the [place] FDA and has been authorized for detection and/or diagnosis of SARS-CoV-2 by FDA under an Emergency Use Authorization (EUA). This EUA will remain in effect (meaning this test can be used) for the duration of the COVID-19 declaration under Section 564(b)(1) of the Act, 21 U.S.C. section 360bbb-3(b)(1), unless the authorization is terminated or revoked.     Resp Syncytial Virus by PCR NEGATIVE NEGATIVE Final    Comment: (NOTE) Fact Sheet for Patients: BloggerCourse.com  Fact Sheet for Healthcare Providers: SeriousBroker.it  This test is  not yet approved or cleared by the [place] FDA and has been authorized for detection and/or diagnosis of SARS-CoV-2 by FDA under an Emergency Use Authorization (EUA). This EUA will remain in effect (meaning this test can be used) for the duration of the COVID-19 declaration under Section 564(b)(1) of the Act, 21 U.S.C. section 360bbb-3(b)(1), unless the authorization is terminated or revoked.  Performed at [facility], [address] [place], [place] [address]      Studies: MR Brain Wo Contrast (neuro protocol) Result Date: 03/16/2023 CLINICAL DATA:  Acute neurologic deficit EXAM: MRI HEAD WITHOUT CONTRAST TECHNIQUE: Multiplanar, multiecho pulse sequences of the brain and surrounding structures were obtained without intravenous contrast. COMPARISON:  04/08/2018 FINDINGS: Brain: No acute infarct, mass effect or extra-axial collection. No acute or chronic hemorrhage. There is multifocal hyperintense T2-weighted signal within the white matter. Parenchymal volume and CSF spaces are normal. The midline structures are normal. Vascular: Normal flow voids. Skull and upper cervical spine: Normal calvarium and skull base. Visualized upper cervical spine and soft tissues are normal. Sinuses/Orbits:Left-greater-than-right mastoid effusions. Ocular lens replacements. Mild ethmoid sinus mucosal thickening. IMPRESSION: 1. No acute intracranial abnormality. 2. Findings of chronic small vessel ischemia. Electronically Signed   By: Deatra Robinson M.D.   On: 03/16/2023 21:23   CT Head Wo Contrast Result Date: 03/16/2023 CLINICAL DATA:  Altered level of consciousness EXAM: CT HEAD WITHOUT CONTRAST TECHNIQUE: Contiguous axial images were obtained from the base of the skull through the vertex without intravenous contrast. RADIATION DOSE REDUCTION: This exam was performed according to the departmental dose-optimization program which includes automated exposure control, adjustment of the mA and/or kV  according to patient size and/or use of iterative reconstruction technique. COMPARISON:  05/19/2022 FINDINGS: Brain: New 1.0 cm hypodensity in the subcortical white matter right frontal lobe reference image 21/6, compatible with age indeterminate ischemic change. No other signs of acute infarct or hemorrhage. Lateral ventricles and midline structures are otherwise unremarkable. No acute extra-axial fluid collections. No mass effect. Vascular: Stable stent and coil in the distribution of the basilar artery. No hyperdense vessel. Skull: Normal. Negative for fracture or focal lesion. Sinuses/Orbits: Mucosal thickening throughout the frontal, ethmoid, and sphenoid sinuses. Bilateral mastoid effusions, left greater than right. Other: None. IMPRESSION: 1. New hypodensity in the right frontal subcortical white matter, measuring up to 1 cm, compatible with age indeterminate ischemic change. 2. No evidence of acute hemorrhage. 3. Stable stent and coil in the basilar artery. 4. Paranasal sinus disease as above, with  bilateral mastoid effusions left greater than right. Electronically Signed   By: Sharlet Salina M.D.   On: 03/16/2023 17:12   DG Chest Port 1 View Result Date: 03/16/2023 CLINICAL DATA:  Shortness of breath EXAM: PORTABLE CHEST 1 VIEW COMPARISON:  Chest radiograph and CTA chest dated 03/05/2023 FINDINGS: Further decrease in low lung volumes with bronchovascular crowding. Increased asymmetric left lung interstitial and dense opacity. No pleural effusion or pneumothorax. Similar cardiomediastinal silhouette. No acute osseous abnormality. Implanted loop recorder is again seen, now projecting over the midline lower mediastinum. IMPRESSION: Further decrease in low lung volumes with bronchovascular crowding. Increased asymmetric left lung interstitial and dense opacity, which may represent a combination of pulmonary fibrosis, atelectasis, aspiration, or pneumonia. Electronically Signed   By: Agustin Cree M.D.   On:  03/16/2023 14:58      Joycelyn Das, MD  [facility] 03/17/2023  If 7PM-7AM, please contact night-coverage

## 2023-03-17 NOTE — H&P (Signed)
 History and Physical    Richard Davenport ZOX:[id number] DOB: 1960-09-04 DOA: 03/16/2023  PCP: Kalman Shan, MD   Patient coming from: Home   Chief Complaint: AMS   HPI: Richard Davenport is a 63 y.o. male with medical history significant for hypertension, type 2 diabetes mellitus, cirrhosis, pulmonary fibrosis/ILD, chronic hypoxic respiratory failure, history of CVA, cerebral aneurysm status post coiling, and recent admission with acute on chronic hypoxic respiratory failure who now presents with altered mental status.  Patient was mildly confused the night of 03/15/2023.  Family states that it is not unusual for him to have mild confusion intermittently, but he remained confused today and was not recognizing his immediate family which he has not done before.  He had periods where he seemed to be in his usual state, but has been quite confused most of the day.  He appeared to be grimacing but denied any pain when his family asked him about it.  His work of breathing seems to be at baseline per report of his daughter who also notes that they have been able to turn his supplemental oxygen down to 6 L/min recently from the 8 to 10 L/min that he had been on previously.  Patient reports shortness of breath and cough, but does not feel that it is worse than usual.  He has no other complaints in the ED and when specifically asked, he denies chest pain, abdominal pain, flank pain, or dysuria.  ED Course: Upon arrival to the ED, patient is found to be afebrile and saturating well on 8 L/min of supplemental oxygen with tachypnea, tachycardia, and stable blood pressure.  EKG demonstrates sinus tachycardia with LVH.  Labs are most notable for elevated BUN to creatinine ratio, alkaline phosphatase 627, albumin <1.5, AST 132, ALT 141, total bilirubin 3.5, hemoglobin 9.8, platelets 91,000, lactic acid 2.3, normal pCO2, and negative respiratory virus panel.  There was question of possible ischemic infarction  on head CT but there are no acute findings on MRI brain.    Review of Systems:  All other systems reviewed and apart from HPI, are negative.  Past Medical History:  Diagnosis Date   AKI (acute kidney injury) (HCC) 08/27/2013   Altered mental status 10/09/2012   Asthma    Diabetes mellitus without complication (HCC)    Headache    Idiopathic pulmonary fibrosis (HCC)    Right thalamic infarction [facility]) s/p tPA 04/10/2018   Stroke (HCC)    Subarachnoid hemorrhage (HCC)    TIA (transient ischemic attack) 08/26/2013    Past Surgical History:  Procedure Laterality Date   ANEURYSM COILING     for bleed   LOOP RECORDER INSERTION N/A 04/10/2018   Procedure: LOOP RECORDER INSERTION;  Surgeon: Duke Salvia, MD;  Location: [facility] INVASIVE CV LAB;  Service: Cardiovascular;  Laterality: N/A;   RADIOLOGY WITH ANESTHESIA N/A 10/09/2012   Procedure: RADIOLOGY WITH ANESTHESIA;  Surgeon: Lisbeth Renshaw, MD;  Location: [facility] OR;  Service: Radiology;  Laterality: N/A;   RADIOLOGY WITH ANESTHESIA N/A 05/27/2013   Procedure: RADIOLOGY WITH ANESTHESIA;  Surgeon: Lisbeth Renshaw, MD;  Location: [facility] OR;  Service: Radiology;  Laterality: N/A;    Social History:   reports that he has never smoked. He has been exposed to tobacco smoke. He has never used smokeless tobacco. He reports that he does not drink alcohol and does not use drugs.  Allergies  Allergen Reactions   Pork-Derived Products Other (See Comments)    Patient is Muslim and PREFERS TO  NOT TAKE ANY PORK OR MEAT PRODUCTS (only fish)    Family History  Problem Relation Age of Onset   Hypertension Mother    Hypertension Father      Prior to Admission medications   Medication Sig Start Date End Date Taking? Authorizing Provider  albuterol (PROVENTIL) (2.5 MG/3ML) 0.083% nebulizer solution Take 3 mLs (2.5 mg total) by nebulization every 4 (four) hours as needed for wheezing or shortness of breath. 01/07/23   Kozlow, Alvira Philips, MD  albuterol  (VENTOLIN HFA) 108 (90 Base) MCG/ACT inhaler Inhale 2 puffs into the lungs every 6 (six) hours as needed for wheezing or shortness of breath. 01/07/23   Kozlow, Alvira Philips, MD  budesonide (PULMICORT) 0.5 MG/2ML nebulizer solution Take 2 mLs (0.5 mg total) by nebulization 2 (two) times daily. 03/10/23 05/09/23  Berton Mount I, MD  furosemide (LASIX) 40 MG tablet Take 1 tablet (40 mg total) by mouth daily. 03/11/23   Barnetta Chapel, MD  gabapentin (NEURONTIN) 300 MG capsule Take 300 mg by mouth 3 (three) times daily. 08/28/22   [provider]  guaiFENesin-dextromethorphan (ROBITUSSIN DM) 100-10 MG/5ML syrup Take 15 mLs by mouth every 8 (eight) hours as needed for cough. 03/10/23   Barnetta Chapel, MD  metFORMIN (GLUCOPHAGE) 500 MG tablet Take 1 tablet (500 mg total) by mouth 2 (two) times daily. Patient taking differently: Take 500 mg by mouth See admin instructions. Take 1 tablet by mouth once daily. May take a twice daily if sugar is high. 12/14/17   Linwood Dibbles, MD  Multiple Vitamin (MULTIVITAMIN WITH MINERALS) TABS tablet Take 1 tablet by mouth daily.    [provider]  omeprazole (PRILOSEC) 20 MG capsule Take 20 mg by mouth daily as needed (for heartburn).    [provider]  Pirfenidone 267 MG TABS Take 2 tablets (534 mg total) by mouth with breakfast, with lunch, and with evening meal. 03/10/23 05/09/23  Berton Mount I, MD  polyethylene glycol (MIRALAX / GLYCOLAX) 17 g packet Take 17 g by mouth daily as needed for mild constipation. 03/10/23   Barnetta Chapel, MD  predniSONE (DELTASONE) 20 MG tablet Take 3 tablets (60 mg total) by mouth daily with breakfast for 7 days, THEN 2 tablets (40 mg total) daily with breakfast for 7 days, THEN 1 tablet (20 mg total) daily with breakfast for 7 days. 03/11/23 04/01/23  Berton Mount I, MD  protein supplement shake (PREMIER PROTEIN) LIQD Take 2 oz by mouth 2 (two) times daily between meals.    [provider]   riTUXimab (RITUXAN) 500 MG/50ML injection Inject 1,000 mg into the vein every 6 (six) months.    [provider]  spironolactone (ALDACTONE) 50 MG tablet Take 50 mg by mouth daily. 05/06/22 05/06/23  [provider]    Physical Exam: Vitals:   03/16/23 2000 03/16/23 2345 03/17/23 0030 03/17/23 0130  BP: 125/88 115/77 114/87 121/84  Pulse: (!) 117 (!) 123 (!) 122 (!) 116  Resp: (!) 28 (!) 30 (!) 25 (!) 29  Temp:   98.8 F (37.1 C)   TempSrc:   Oral   SpO2: 97% 100% 99% 100%  Weight:      Height:        Constitutional: Cachectic, no pallor or diaphoresis  Eyes: PERTLA, lids and conjunctivae normal ENMT: Mucous membranes are moist. Posterior pharynx clear of any exudate or lesions.   Neck: supple, no masses  Respiratory: Fine rales bilaterally, no wheezing. Tachypneic.  Cardiovascular: Rate ~120 and regular. No extremity edema.   Abdomen: Soft, distended. Bowel sounds active.  Musculoskeletal: no clubbing / cyanosis. No joint deformity upper and lower extremities.   Skin: no significant rashes, lesions, ulcers. Warm, dry, well-perfused. Neurologic: CN 2-12 grossly intact. Moving all extremities. Sleeping, wakes to voice and is oriented to person and place only.  Psychiatric: Pleasant. Cooperative.    Labs and Imaging on Admission: I have personally reviewed following labs and imaging studies  CBC: Recent Labs  Lab 03/16/23 1307 03/16/23 1415  WBC 8.7  --   NEUTROABS 7.2  --   HGB 9.8* 11.2*  HCT 31.0* 33.0*  MCV 114.8*  --   PLT 91*  --    Basic Metabolic Panel: Recent Labs  Lab 03/16/23 1307 03/16/23 1415  NA 137 141  K 4.3 4.4  CL 100  --   CO2 31  --   GLUCOSE 153*  --   BUN 29*  --   CREATININE 0.76  --   CALCIUM 7.8*  --    GFR: Estimated Creatinine Clearance: 95 mL/min (by C-G formula based on SCr of 0.76 mg/dL). Liver Function Tests: Recent Labs  Lab 03/16/23 1307  AST 132*  ALT 141*  ALKPHOS 627*  BILITOT 3.5*  PROT 7.6   ALBUMIN <1.5*   No results for input(s): "LIPASE", "AMYLASE" in the last 168 hours. Recent Labs  Lab 03/16/23 1537  AMMONIA 63*   Coagulation Profile: No results for input(s): "INR", "PROTIME" in the last 168 hours. Cardiac Enzymes: No results for input(s): "CKTOTAL", "CKMB", "CKMBINDEX", "TROPONINI" in the last 168 hours. BNP (last 3 results) Recent Labs    11/05/22 1434  PROBNP 111.0*   HbA1C: No results for input(s): "HGBA1C" in the last 72 hours. CBG: Recent Labs  Lab 03/10/23 0411 03/10/23 0802 03/10/23 1154  GLUCAP 98 297* 146*   Lipid Profile: No results for input(s): "CHOL", "HDL", "LDLCALC", "TRIG", "CHOLHDL", "LDLDIRECT" in the last 72 hours. Thyroid Function Tests: No results for input(s): "TSH", "T4TOTAL", "FREET4", "T3FREE", "THYROIDAB" in the last 72 hours. Anemia Panel: No results for input(s): "VITAMINB12", "FOLATE", "FERRITIN", "TIBC", "IRON", "RETICCTPCT" in the last 72 hours. Urine analysis:    Component Value Date/Time   COLORURINE AMBER (A) 03/16/2023 2224   APPEARANCEUR CLEAR 03/16/2023 2224   LABSPEC 1.014 03/16/2023 2224   PHURINE 7.0 03/16/2023 2224   GLUCOSEU NEGATIVE 03/16/2023 2224   HGBUR LARGE (A) 03/16/2023 2224   BILIRUBINUR NEGATIVE 03/16/2023 2224   KETONESUR NEGATIVE 03/16/2023 2224   PROTEINUR NEGATIVE 03/16/2023 2224   UROBILINOGEN 0.2 05/27/2014 1230   NITRITE NEGATIVE 03/16/2023 2224   LEUKOCYTESUR NEGATIVE 03/16/2023 2224   Sepsis Labs: @LABRCNTIP (procalcitonin:4,lacticidven:4) ) Recent Results (from the past 240 hours)  Resp panel by RT-PCR (RSV, Flu A&B, Covid) Anterior Nasal Swab     Status: None   Collection Time: 03/16/23  2:03 PM   Specimen: Anterior Nasal Swab  Result Value Ref Range Status   SARS Coronavirus 2 by RT PCR NEGATIVE NEGATIVE Final   Influenza A by PCR NEGATIVE NEGATIVE Final   Influenza B by PCR NEGATIVE NEGATIVE Final    Comment: (NOTE) The Xpert Xpress SARS-CoV-2/FLU/RSV plus assay is  intended as an aid in the diagnosis of influenza from Nasopharyngeal swab specimens and should not be used as a sole basis for treatment. Nasal washings and aspirates are unacceptable for Xpert Xpress SARS-CoV-2/FLU/RSV testing.  Fact Sheet for Patients: BloggerCourse.com  Fact Sheet for Healthcare Providers: SeriousBroker.it  This test is  not yet approved or cleared by the [place] and has been authorized for detection and/or diagnosis of SARS-CoV-2 by FDA under an Emergency Use Authorization (EUA). This EUA will remain in effect (meaning this test can be used) for the duration of the COVID-19 declaration under Section 564(b)(1) of the Act, 21 U.S.C. section 360bbb-3(b)(1), unless the authorization is terminated or revoked.     Resp Syncytial Virus by PCR NEGATIVE NEGATIVE Final    Comment: (NOTE) Fact Sheet for Patients: BloggerCourse.com  Fact Sheet for Healthcare Providers: SeriousBroker.it  This test is not yet approved or cleared by the [place] FDA and has been authorized for detection and/or diagnosis of SARS-CoV-2 by FDA under an Emergency Use Authorization (EUA). This EUA will remain in effect (meaning this test can be used) for the duration of the COVID-19 declaration under Section 564(b)(1) of the Act, 21 U.S.C. section 360bbb-3(b)(1), unless the authorization is terminated or revoked.  Performed at [facility], [address] [place], [place] [address]      Radiological Exams on Admission: MR Brain Wo Contrast (neuro protocol) Result Date: 03/16/2023 CLINICAL DATA:  Acute neurologic deficit EXAM: MRI HEAD WITHOUT CONTRAST TECHNIQUE: Multiplanar, multiecho pulse sequences of the brain and surrounding structures were obtained without intravenous contrast. COMPARISON:  04/08/2018 FINDINGS: Brain: No acute infarct, mass effect or extra-axial  collection. No acute or chronic hemorrhage. There is multifocal hyperintense T2-weighted signal within the white matter. Parenchymal volume and CSF spaces are normal. The midline structures are normal. Vascular: Normal flow voids. Skull and upper cervical spine: Normal calvarium and skull base. Visualized upper cervical spine and soft tissues are normal. Sinuses/Orbits:Left-greater-than-right mastoid effusions. Ocular lens replacements. Mild ethmoid sinus mucosal thickening. IMPRESSION: 1. No acute intracranial abnormality. 2. Findings of chronic small vessel ischemia. Electronically Signed   By: Deatra Robinson M.D.   On: 03/16/2023 21:23   CT Head Wo Contrast Result Date: 03/16/2023 CLINICAL DATA:  Altered level of consciousness EXAM: CT HEAD WITHOUT CONTRAST TECHNIQUE: Contiguous axial images were obtained from the base of the skull through the vertex without intravenous contrast. RADIATION DOSE REDUCTION: This exam was performed according to the departmental dose-optimization program which includes automated exposure control, adjustment of the mA and/or kV according to patient size and/or use of iterative reconstruction technique. COMPARISON:  05/19/2022 FINDINGS: Brain: New 1.0 cm hypodensity in the subcortical white matter right frontal lobe reference image 21/6, compatible with age indeterminate ischemic change. No other signs of acute infarct or hemorrhage. Lateral ventricles and midline structures are otherwise unremarkable. No acute extra-axial fluid collections. No mass effect. Vascular: Stable stent and coil in the distribution of the basilar artery. No hyperdense vessel. Skull: Normal. Negative for fracture or focal lesion. Sinuses/Orbits: Mucosal thickening throughout the frontal, ethmoid, and sphenoid sinuses. Bilateral mastoid effusions, left greater than right. Other: None. IMPRESSION: 1. New hypodensity in the right frontal subcortical white matter, measuring up to 1 cm, compatible with age  indeterminate ischemic change. 2. No evidence of acute hemorrhage. 3. Stable stent and coil in the basilar artery. 4. Paranasal sinus disease as above, with bilateral mastoid effusions left greater than right. Electronically Signed   By: Sharlet Salina M.D.   On: 03/16/2023 17:12   DG Chest Port 1 View Result Date: 03/16/2023 CLINICAL DATA:  Shortness of breath EXAM: PORTABLE CHEST 1 VIEW COMPARISON:  Chest radiograph and CTA chest dated 03/05/2023 FINDINGS: Further decrease in low lung volumes with bronchovascular crowding. Increased asymmetric left lung interstitial and dense  opacity. No pleural effusion or pneumothorax. Similar cardiomediastinal silhouette. No acute osseous abnormality. Implanted loop recorder is again seen, now projecting over the midline lower mediastinum. IMPRESSION: Further decrease in low lung volumes with bronchovascular crowding. Increased asymmetric left lung interstitial and dense opacity, which may represent a combination of pulmonary fibrosis, atelectasis, aspiration, or pneumonia. Electronically Signed   By: Agustin Cree M.D.   On: 03/16/2023 14:58    EKG: Independently reviewed. Sinus tachcyardia, rate 124, LVH.   Assessment/Plan   1. Acute encephalopathy  - There is no acute finding on MRI brain, no asterixis, no urinary or infectious s/s, and no hypercarbia on blood gas  - Check TSH, RPR, and B12, hold gabapentin, use delirium precautions    2. Progressive pulmonary fibrosis; chronic hypoxic respiratory failure  - Continue supplemental O2, prednisone taper, inhalers, supportive care  - Hold pirfenidone while trending LFTs   3. Cirrhosis  - LFTs have increased  - Check INR, continue diuretics    4. Anemia; thrombocytopenia  - Platelets have decreased, no overt bleeding, likely related to cirrhosis   - Hgb appears stable   5. Protein-calorie malnutrition  - Encourage protein shakes     DVT prophylaxis: SCDs  Code Status: DNR Level of Care: Level of care:  Progressive Family Communication: Daughter at bedside  Disposition Plan:  Patient is from: home  Anticipated d/c is to: TBD Anticipated d/c date is: 2/24 or 03/18/23  Patient currently: Pending further investigation of AMS  Consults called: None  Admission status: Observation     Briscoe Deutscher, MD [facility]  03/17/2023, 2:04 AM

## 2023-03-17 NOTE — ED Notes (Signed)
 Pt incontinent of urine. Changed and fresh linen applied to pt

## 2023-03-18 ENCOUNTER — Inpatient Hospital Stay (HOSPITAL_COMMUNITY): Payer: Medicaid Other

## 2023-03-18 DIAGNOSIS — G9340 Encephalopathy, unspecified: Secondary | ICD-10-CM | POA: Diagnosis not present

## 2023-03-18 LAB — MAGNESIUM: Magnesium: 1.8 mg/dL (ref 1.7–2.4)

## 2023-03-18 LAB — COMPREHENSIVE METABOLIC PANEL
ALT: 129 U/L — ABNORMAL HIGH (ref 0–44)
AST: 185 U/L — ABNORMAL HIGH (ref 15–41)
Albumin: <1.5 g/dL — ABNORMAL LOW (ref 3.5–5.0)
Alkaline Phosphatase: 522 U/L — ABNORMAL HIGH (ref 38–126)
Anion gap: 5 (ref 5–15)
BUN: 32 mg/dL — ABNORMAL HIGH (ref 6–20)
CO2: 30 mmol/L (ref 22–32)
Calcium: 7.5 mg/dL — ABNORMAL LOW (ref 8.9–10.3)
Chloride: 100 mmol/L (ref 98–111)
Creatinine, Ser: 0.75 mg/dL (ref 0.61–1.24)
GFR, Estimated: >60 mL/min (ref >60)
Glucose, Bld: 177 mg/dL — ABNORMAL HIGH (ref 70–99)
Potassium: 4.5 mmol/L (ref 3.5–5.1)
Sodium: 135 mmol/L (ref 135–145)
Total Bilirubin: 2.7 mg/dL — ABNORMAL HIGH (ref 0.0–1.2)
Total Protein: 6.7 g/dL (ref 6.5–8.1)

## 2023-03-18 LAB — CBC
HCT: 27.1 % — ABNORMAL LOW (ref 39.0–52.0)
Hemoglobin: 8.7 g/dL — ABNORMAL LOW (ref 13.0–17.0)
MCH: 36.6 pg — ABNORMAL HIGH (ref 26.0–34.0)
MCHC: 32.1 g/dL (ref 30.0–36.0)
MCV: 113.9 fL — ABNORMAL HIGH (ref 80.0–100.0)
Platelets: 82 10*3/uL — ABNORMAL LOW (ref 150–400)
RBC: 2.38 MIL/uL — ABNORMAL LOW (ref 4.22–5.81)
RDW: 19.6 % — ABNORMAL HIGH (ref 11.5–15.5)
WBC: 9.4 10*3/uL (ref 4.0–10.5)
nRBC: 0 % (ref 0.0–0.2)

## 2023-03-18 LAB — GLUCOSE, CAPILLARY: Glucose-Capillary: 329 mg/dL — ABNORMAL HIGH (ref 70–99)

## 2023-03-18 LAB — PROTIME-INR
INR: 1.5 — ABNORMAL HIGH (ref 0.8–1.2)
Prothrombin Time: 18 s — ABNORMAL HIGH (ref 11.4–15.2)

## 2023-03-18 MED ORDER — INSULIN ASPART 100 UNIT/ML IJ SOLN
0.0000 [IU] | Freq: Every day | INTRAMUSCULAR | Status: DC
  Administered 2023-03-18: 4 [IU] via SUBCUTANEOUS

## 2023-03-18 MED ORDER — INSULIN GLARGINE-YFGN 100 UNIT/ML ~~LOC~~ SOPN: 10.0000 [IU] | PEN_INJECTOR | SUBCUTANEOUS | Status: DC

## 2023-03-18 MED ORDER — ADULT MULTIVITAMIN W/MINERALS CH
1.0000 | ORAL_TABLET | Freq: Every day | ORAL | Status: DC
  Administered 2023-03-18 – 2023-03-19 (×2): 1 via ORAL
  Filled 2023-03-18 (×2): qty 1

## 2023-03-18 MED ORDER — INSULIN ASPART 100 UNIT/ML IJ SOLN
0.0000 [IU] | Freq: Three times a day (TID) | INTRAMUSCULAR | Status: DC
  Administered 2023-03-19: 7 [IU] via SUBCUTANEOUS

## 2023-03-18 MED ORDER — INSULIN GLARGINE 100 UNIT/ML ~~LOC~~ SOLN
10.0000 [IU] | SUBCUTANEOUS | Status: DC
  Administered 2023-03-18: 10 [IU] via SUBCUTANEOUS
  Filled 2023-03-18 (×2): qty 0.1

## 2023-03-18 NOTE — Progress Notes (Signed)
 PT Cancellation Note  Patient Details Name: Richard Davenport MRN: [id number] DOB: 1960-02-16   Cancelled Treatment:    Reason Eval/Treat Not Completed: Patient at procedure or test/unavailable  Imminent discharge order acknowledged. RN reports pt about to be transported to ultrasound. Will secure chat therapist when pt returns so we can expedite evaluation.   Kathlyn Sacramento, PT, DPT [facility]  Rehabilitation Services Physical Therapist Office: [phone] Website: [email or web address]  Berton Mount 03/18/2023, 12:13 PM

## 2023-03-18 NOTE — Progress Notes (Signed)
 PROGRESS NOTE  Richard Davenport ZOX:[id number] DOB: [birth year] DOA: 03/16/2023 PCP: Kalman Shan, MD   LOS: 1 day   Brief narrative:   Richard Davenport is a 63 y.o. male with past medical history significant for hypertension, type 2 diabetes mellitus, cirrhosis, pulmonary fibrosis/ILD, chronic hypoxic respiratory failure, history of CVA, cerebral aneurysm status post coiling, and recent admission with acute on chronic hypoxic respiratory failure presented to hospital with altered mental status on 03/15/2023.  At baseline he does have mild confusion but on presentation he was confused the whole day which was not usual for him.  His work of breathing was baseline as per the daughter and had been on 6 L/min recently from the 8 to 10 L/min that he had been on previously.  Patient reported shortness of breath and cough which is at his baseline.  In the ED patient was afebrile on 8 L of oxygen but was tachypneic tachycardic.  EKG showed sinus tachycardia.  Labs showed AST elevation at 132 with ALT of 141 and total bilirubin 3.5.  Lactate was elevated at 2.3.  CT head scan and MRI did not reveal any evidence of stroke.  Patient was then considered for admission to hospital for further evaluation and treatment.    Assessment/Plan: Principal Problem:   Acute encephalopathy Active Problems:   Chronic respiratory failure with hypoxia (HCC) - 3L/min   Pulmonary fibrosis (HCC)   Thrombocytopenia (HCC)   Cirrhosis (HCC)   Protein-calorie malnutrition, severe   Altered mental status    Acute metabolic encephalopathy  Improved today with more alertness.  Could be multifactorial from medication induced, cirrhosis of liver, hypoxia, possible UTI with possible superimposed lung infection.. ABG without hypercarbia.  MRI brain negative for acute findings.  Vitamin B12 elevated at 1595.  TSH slightly elevated at 6.6.  Did not have fever or leukocytosis.  Urinalysis was negative for infection.  Ammonia was  slightly elevated at 63.  Patient was initiated on lactulose with bowel movements.  Influenza and RSV was negative.  RPR nonreactive.  Gabapentin on hold.  Thinly.  Avoid sedative hypnotics for now.  Encourage oral hydration.  Empiric IV Rocephin for now.  Fall and aspiration precaution.  Plan to continue lactulose low-dose on discharge.   Progressive pulmonary fibrosis; chronic hypoxic respiratory failure  -Chest x-ray showed decreasing lung volumes with bronchovascular crowding.  Continue supplemental O2, prednisone taper, inhalers, supportive care  Hold pirfenidone while trending LFTs.  Patient follows up with Dr. Marchelle Gearing as outpatient.  On prednisone taper.  Will continue to reduce the dose.   plan to introduce Semglee 10 units at nighttime for adequate control of blood glucose levels while on steroids..  Will order today and do insulin teaching.  On metformin at home.   Cirrhosis of liver with coagulopathy.. Elevation of liver enzymes with INR elevation noted.  Has been started on lactulose.  Slightly trended down elevated.  Check levels in AM.  INR of 1.5 today from 1.8.  Will check right upper quadrant ultrasound   Anemia; thrombocytopenia  Hemoglobin of 8.7 from 8.8  yesterday.  No overt signs of bleeding.  Platelets low but slightly improved to 82 from to 79..   Protein-calorie malnutrition   on protein supplementation..  Will get nutrition evaluation.  Debility weakness.  Will get PT evaluation today.   DVT prophylaxis: SCDs Start: 03/17/23 0203   Disposition: Likely home in 1 to 2 days  Status is: Inpatient The patient is  inpatient because: Pending clinical improvement,  elevated LFTs,    Code Status:     Code Status: Limited: Do not attempt resuscitation (DNR) -DNR-LIMITED -Do Not Intubate/DNI   Family Communication: Spoke with the patient's daughter at bedside again today.  Consultants: None  Procedures: None  Anti-infectives:  Rocephin IV  Anti-infectives (From  admission, onward)    Start     Dose/Rate Route Frequency Ordered Stop   03/17/23 1200  cefTRIAXone (ROCEPHIN) 1 g in sodium chloride 0.9 % 100 mL IVPB        1 g 200 mL/hr over 30 Minutes Intravenous Every 24 hours 03/17/23 1141     03/16/23 2330  cefTRIAXone (ROCEPHIN) 1 g in sodium chloride 0.9 % 100 mL IVPB  Status:  Discontinued        1 g 200 mL/hr over 30 Minutes Intravenous  Once 03/16/23 2320 03/16/23 2358       Subjective: Today, patient was seen and examined at bedside.  Patient's daughter stated that he looks much better with his mentation today and is almost at his baseline.  Has had a bowel movement with lactulose yesterday.  Denies any pain, nausea, vomiting, abdominal pain.  Communicative.  Objective: Vitals:   03/18/23 1000 03/18/23 1100  BP: 120/87 114/76  Pulse: (!) 103 95  Resp: (!) 29 (!) 28  Temp: 98.3 F (36.8 C)   SpO2: 100% 100%   No intake or output data in the 24 hours ending 03/18/23 1146  Filed Weights   03/16/23 1253  Weight: 72.5 kg   Body mass index is 24.3 kg/m.   Physical Exam:  GENERAL: Patient is alert awake and oriented, not in obvious distress on nasal cannula oxygen, chronically ill and deconditioned. HENT: No scleral pallor or icterus. Pupils equally reactive to light. Oral mucosa is moist NECK: is supple, no gross swelling noted. CHEST: Decreased breath sounds bilaterally, coarse breath sounds noted CVS: S1 and S2 heard, no murmur. Regular rate and rhythm.  ABDOMEN: Soft, non-tender, bowel sounds are present. EXTREMITIES: No edema. CNS: Cranial nerves are intact.  Moves extremities. SKIN: warm and dry without rashes.  Data Review: I have personally reviewed the following laboratory data and studies,  CBC: Recent Labs  Lab 03/16/23 1307 03/16/23 1415 03/17/23 0541 03/18/23 0948  WBC 8.7  --  8.6 9.4  NEUTROABS 7.2  --   --   --   HGB 9.8* 11.2* 8.8* 8.7*  HCT 31.0* 33.0* 26.9* 27.1*  MCV 114.8*  --  113.5* 113.9*   PLT 91*  --  79* 82*   Basic Metabolic Panel: Recent Labs  Lab 03/16/23 1307 03/16/23 1415 03/17/23 0541 03/18/23 0636  NA 137 141 137 135  K 4.3 4.4 4.3 4.5  CL 100  --  102 100  CO2 31  --  32 30  GLUCOSE 153*  --  146* 177*  BUN 29*  --  24* 32*  CREATININE 0.76  --  0.82 0.75  CALCIUM 7.8*  --  7.6* 7.5*  MG  --   --   --  1.8   Liver Function Tests: Recent Labs  Lab 03/16/23 1307 03/17/23 0541 03/18/23 0636  AST 132* 120* 185*  ALT 141* 119* 129*  ALKPHOS 627* 507* 522*  BILITOT 3.5* 2.8* 2.7*  PROT 7.6 6.6 6.7  ALBUMIN <1.5* <1.5* <1.5*   No results for input(s): "LIPASE", "AMYLASE" in the last 168 hours. Recent Labs  Lab 03/16/23 1537  AMMONIA 63*   Cardiac Enzymes: No results for input(s): "CKTOTAL", "  CKMB", "CKMBINDEX", "TROPONINI" in the last 168 hours. BNP (last 3 results) Recent Labs    02/11/23 2028 03/08/23 0323  BNP 101.4* 155.5*    ProBNP (last 3 results) Recent Labs    11/05/22 1434  PROBNP 111.0*    CBG: Recent Labs  Lab 03/17/23 2303  GLUCAP 263*   Recent Results (from the past 240 hours)  Resp panel by RT-PCR (RSV, Flu A&B, Covid) Anterior Nasal Swab     Status: None   Collection Time: 03/16/23  2:03 PM   Specimen: Anterior Nasal Swab  Result Value Ref Range Status   SARS Coronavirus 2 by RT PCR NEGATIVE NEGATIVE Final   Influenza A by PCR NEGATIVE NEGATIVE Final   Influenza B by PCR NEGATIVE NEGATIVE Final    Comment: (NOTE) The Xpert Xpress SARS-CoV-2/FLU/RSV plus assay is intended as an aid in the diagnosis of influenza from Nasopharyngeal swab specimens and should not be used as a sole basis for treatment. Nasal washings and aspirates are unacceptable for Xpert Xpress SARS-CoV-2/FLU/RSV testing.  Fact Sheet for Patients: BloggerCourse.com  Fact Sheet for Healthcare Providers: SeriousBroker.it  This test is not yet approved or cleared by the [place] FDA  and has been authorized for detection and/or diagnosis of SARS-CoV-2 by FDA under an Emergency Use Authorization (EUA). This EUA will remain in effect (meaning this test can be used) for the duration of the COVID-19 declaration under Section 564(b)(1) of the Act, 21 U.S.C. section 360bbb-3(b)(1), unless the authorization is terminated or revoked.     Resp Syncytial Virus by PCR NEGATIVE NEGATIVE Final    Comment: (NOTE) Fact Sheet for Patients: BloggerCourse.com  Fact Sheet for Healthcare Providers: SeriousBroker.it  This test is not yet approved or cleared by the [place] FDA and has been authorized for detection and/or diagnosis of SARS-CoV-2 by FDA under an Emergency Use Authorization (EUA). This EUA will remain in effect (meaning this test can be used) for the duration of the COVID-19 declaration under Section 564(b)(1) of the Act, 21 U.S.C. section 360bbb-3(b)(1), unless the authorization is terminated or revoked.  Performed at [facility], [address] [place], [place] [address]      Studies: MR Brain Wo Contrast (neuro protocol) Result Date: 03/16/2023 CLINICAL DATA:  Acute neurologic deficit EXAM: MRI HEAD WITHOUT CONTRAST TECHNIQUE: Multiplanar, multiecho pulse sequences of the brain and surrounding structures were obtained without intravenous contrast. COMPARISON:  04/08/2018 FINDINGS: Brain: No acute infarct, mass effect or extra-axial collection. No acute or chronic hemorrhage. There is multifocal hyperintense T2-weighted signal within the white matter. Parenchymal volume and CSF spaces are normal. The midline structures are normal. Vascular: Normal flow voids. Skull and upper cervical spine: Normal calvarium and skull base. Visualized upper cervical spine and soft tissues are normal. Sinuses/Orbits:Left-greater-than-right mastoid effusions. Ocular lens replacements. Mild ethmoid sinus mucosal thickening.  IMPRESSION: 1. No acute intracranial abnormality. 2. Findings of chronic small vessel ischemia. Electronically Signed   By: Deatra Robinson M.D.   On: 03/16/2023 21:23   CT Head Wo Contrast Result Date: 03/16/2023 CLINICAL DATA:  Altered level of consciousness EXAM: CT HEAD WITHOUT CONTRAST TECHNIQUE: Contiguous axial images were obtained from the base of the skull through the vertex without intravenous contrast. RADIATION DOSE REDUCTION: This exam was performed according to the departmental dose-optimization program which includes automated exposure control, adjustment of the mA and/or kV according to patient size and/or use of iterative reconstruction technique. COMPARISON:  05/19/2022 FINDINGS: Brain: New 1.0 cm hypodensity in the subcortical  white matter right frontal lobe reference image 21/6, compatible with age indeterminate ischemic change. No other signs of acute infarct or hemorrhage. Lateral ventricles and midline structures are otherwise unremarkable. No acute extra-axial fluid collections. No mass effect. Vascular: Stable stent and coil in the distribution of the basilar artery. No hyperdense vessel. Skull: Normal. Negative for fracture or focal lesion. Sinuses/Orbits: Mucosal thickening throughout the frontal, ethmoid, and sphenoid sinuses. Bilateral mastoid effusions, left greater than right. Other: None. IMPRESSION: 1. New hypodensity in the right frontal subcortical white matter, measuring up to 1 cm, compatible with age indeterminate ischemic change. 2. No evidence of acute hemorrhage. 3. Stable stent and coil in the basilar artery. 4. Paranasal sinus disease as above, with bilateral mastoid effusions left greater than right. Electronically Signed   By: Sharlet Salina M.D.   On: 03/16/2023 17:12   DG Chest Port 1 View Result Date: 03/16/2023 CLINICAL DATA:  Shortness of breath EXAM: PORTABLE CHEST 1 VIEW COMPARISON:  Chest radiograph and CTA chest dated 03/05/2023 FINDINGS: Further decrease in  low lung volumes with bronchovascular crowding. Increased asymmetric left lung interstitial and dense opacity. No pleural effusion or pneumothorax. Similar cardiomediastinal silhouette. No acute osseous abnormality. Implanted loop recorder is again seen, now projecting over the midline lower mediastinum. IMPRESSION: Further decrease in low lung volumes with bronchovascular crowding. Increased asymmetric left lung interstitial and dense opacity, which may represent a combination of pulmonary fibrosis, atelectasis, aspiration, or pneumonia. Electronically Signed   By: Agustin Cree M.D.   On: 03/16/2023 14:58      Joycelyn Das, MD  [facility] 03/18/2023  If 7PM-7AM, please contact night-coverage

## 2023-03-18 NOTE — Evaluation (Signed)
 Physical Therapy Evaluation Patient Details Name: Richard Davenport MRN: [id number] DOB: 08-22-1960 Today's Date: 03/18/2023  History of Present Illness  63 y.o. male presented with altered mental status on 2/23. Dx with Acute metabolic encephalopathy. PMHx: hypertension, type 2 diabetes mellitus, cirrhosis, pulmonary fibrosis/ILD, chronic hypoxic respiratory failure, history of CVA, cerebral aneurysm status post coiling, and recent admission with acute on chronic hypoxic respiratory failure.  Clinical Impression  Pt admitted with above diagnosis. Family present and very supportive. After recent d/c, pt required assistance with all care, and was only transferring to and from BSC<>W/c<>Bed and required physical assist. Today, pt demonstrated ability to transfer with CGA while using RW for support. Min assist to ambulate very short distance in room. He fatigues rapidly with RPE rising from 6/10 to 8/10, dyspnea increased from 2/3 to 3/3. Recovered quickly upon sitting. Family pleased with progress this admission and feel they can provide adequate level of assist at d/c. They thought RW was helpful for his stability and will decrease burden of care and improve his safety. Pt currently with functional limitations due to the deficits listed below (see PT Problem List). Pt will benefit from acute skilled PT to increase their independence and safety with mobility to allow discharge.       SpO2 100% at rest, 8L supplemental O2. With short distance gait, drops to lower/mid 80s with fair/poor waveform. Rises back to upper 90s within 2 minutes of resting in bed.    If plan is discharge home, recommend the following: Assistance with cooking/housework;Assist for transportation;Help with stairs or ramp for entrance;A little help with walking and/or transfers;A little help with bathing/dressing/bathroom   Can travel by private vehicle        Equipment Recommendations Rolling walker (2 wheels)  Recommendations  for Other Services       Functional Status Assessment Patient has had a recent decline in their functional status and demonstrates the ability to make significant improvements in function in a reasonable and predictable amount of time.     Precautions / Restrictions Precautions Precautions: Fall Recall of Precautions/Restrictions: Intact Precaution/Restrictions Comments: monitor O2 Restrictions Weight Bearing Restrictions Per Provider Order: No      Mobility  Bed Mobility Overal bed mobility: Needs Assistance Bed Mobility: Supine to Sit, Sit to Supine     Supine to sit: Contact guard Sit to supine: Contact guard assist   General bed mobility comments: Slow and effortful, cues for technique, slow to rise but performed with CGA only.    Transfers Overall transfer level: Needs assistance Equipment used: Rolling walker (2 wheels) Transfers: Sit to/from Stand Sit to Stand: Contact guard assist           General transfer comment: CGA for safety, cues for hand placement, slow to rise but stable once upright with RW for support. Increased dyspnea.    Ambulation/Gait Ambulation/Gait assistance: Min assist Gait Distance (Feet): 10 Feet ((5' forward 5' backwards, lateral steps along bed)) Assistive device: Rolling walker (2 wheels) Gait Pattern/deviations: Step-through pattern, Decreased stride length Gait velocity: dec     General Gait Details: Increased dyspnea, SpO2 decreased to mid 80s with HR up to 120s. Initially with CGA, however as he fatigued, requires multimodal cues and min assist to faciltate steps and walker control with lateral steps along bed.  Stairs            Wheelchair Mobility     Tilt Bed    Modified Rankin (Stroke Patients Only)  Balance Overall balance assessment: Needs assistance Sitting-balance support: No upper extremity supported, Feet supported Sitting balance-Leahy Scale: Fair     Standing balance support: Bilateral upper  extremity supported Standing balance-Leahy Scale: Poor                               Pertinent Vitals/Pain Pain Assessment Pain Assessment: No/denies pain    Home Living Family/patient expects to be discharged to:: Private residence Living Arrangements: Spouse/significant other;Children;Other relatives Available Help at Discharge: Family;Available 24 hours/day Type of Home: House Home Access: Stairs to enter Entrance Stairs-Rails: None Entrance Stairs-Number of Steps: 2   Home Layout: One level Home Equipment: BSC/3in1;Wheelchair Financial trader (4 wheels) Additional Comments: Home O2    Prior Function Prior Level of Function : Needs assist             Mobility Comments: Using 7L-8L O2 at home. Since last visit about 2 weeks ago, pt has primarily been transferring Bed<>w/c<>BSC . Needs pushed around in the wheelchair. Had PT arranged previously 1x/week. Prior to last admission about  weeks ago, pt was ambulatory on 10L O2, no device. ADLs Comments: Was independent prior to last admission about 2 weeks ago. Family helping with all ADLs.     Extremity/Trunk Assessment   Upper Extremity Assessment Upper Extremity Assessment: Defer to OT evaluation    Lower Extremity Assessment Lower Extremity Assessment: Generalized weakness       Communication   Communication Communication: No apparent difficulties    Cognition Arousal: Alert Behavior During Therapy: WFL for tasks assessed/performed   PT - Cognitive impairments: No apparent impairments                         Following commands: Intact       Cueing Cueing Techniques: Verbal cues     General Comments General comments (skin integrity, edema, etc.): Family present and supportive.    Exercises     Assessment/Plan    PT Assessment Patient needs continued PT services  PT Problem List Decreased strength;Decreased activity tolerance;Decreased balance;Decreased mobility;Decreased  knowledge of use of DME;Cardiopulmonary status limiting activity       PT Treatment Interventions DME instruction;Gait training;Stair training;Functional mobility training;Therapeutic activities;Therapeutic exercise;Balance training;Neuromuscular re-education;Patient/family education    PT Goals (Current goals can be found in the Care Plan section)  Acute Rehab PT Goals Patient Stated Goal: return home PT Goal Formulation: With patient/family Time For Goal Achievement: 04/01/23 Potential to Achieve Goals: Fair    Frequency Min 1X/week     Co-evaluation               AM-PAC PT "6 Clicks" Mobility  Outcome Measure Help needed turning from your back to your side while in a flat bed without using bedrails?: A Little Help needed moving from lying on your back to sitting on the side of a flat bed without using bedrails?: A Little Help needed moving to and from a bed to a chair (including a wheelchair)?: A Little Help needed standing up from a chair using your arms (e.g., wheelchair or bedside chair)?: A Little Help needed to walk in hospital room?: A Little Help needed climbing 3-5 steps with a railing? : A Little 6 Click Score: 18    End of Session Equipment Utilized During Treatment: Gait belt;Oxygen Activity Tolerance: Patient limited by fatigue Patient left: in bed;with call bell/phone within reach;with bed alarm set;with family/visitor present  PT Visit Diagnosis: Muscle weakness (generalized) (M62.81);Unsteadiness on feet (R26.81);Difficulty in walking, not elsewhere classified (R26.2)    Time: 4098-1191 PT Time Calculation (min) (ACUTE ONLY): 24 min   Charges:   PT Evaluation $PT Eval Moderate Complexity: 1 Mod PT Treatments $Therapeutic Activity: 8-22 mins PT General Charges $$ ACUTE PT VISIT: 1 Visit         Kathlyn Sacramento, PT, DPT [facility]  Rehabilitation Services Physical Therapist Office: [phone] Website: [email or web address]   Berton Mount 03/18/2023, 2:12 PM

## 2023-03-18 NOTE — Progress Notes (Signed)
 Initial Nutrition Assessment  DOCUMENTATION CODES:   Severe malnutrition in context of chronic illness  INTERVENTION:  Continue with current reg diet Continue with ensure max.  Multivitamin/minerals   NUTRITION DIAGNOSIS:   Severe Malnutrition related to chronic illness as evidenced by moderate fat depletion, severe muscle depletion.    GOAL:   Patient will meet greater than or equal to 90% of their needs    MONITOR:   PO intake  REASON FOR ASSESSMENT:   Consult Assessment of nutrition requirement/status  ASSESSMENT:  63 y.o. M, presented to ED from home with reports of SOB, intermittent episodes of confusion. Admitting with acute encephalopathy.  PMH: Pulmonary fibrosis/ILD/chronic hypoxic RF on 8 to 10 L, liver cirrhosis, TIA, CVA, cerebral aneurysm s/p coil, DM-2, HTN and HLD who was discharged from the hospital on 03/10/2023 after having an exacerbation of his pulmonary fibrosis which responded to steroids and nebulizer.  No family at bedside during RDN visit. Nutritional history obtained from prior visit with reports of patient watches the amount of carbohydrates he consumed. He will drink 2-3 Ensures a day.  Admit weight: 72.5 kg Weight history: 03/16/23 72.5 kg  03/05/23 72.6 kg  03/05/23 72.6 kg  02/15/23 66.2 kg  02/02/23 69.4 kg  01/07/23 69.4 kg  01/01/23 69.2 kg  12/19/22 69 kg  11/01/22 69 kg  07/01/22 74.4 kg      Average Meal Intake: No current documentation.   Nutritionally Relevant Medications: Scheduled Meds:  furosemide  40 mg Oral Daily   insulin aspart  0-5 Units Subcutaneous QHS   insulin aspart  0-9 Units Subcutaneous TID WC   insulin glargine  10 Units Subcutaneous Q24H   lactulose  20 g Oral BID   Ensure Max Protein  11 oz Oral BID   spironolactone  50 mg Oral Daily    Labs Reviewed    NUTRITION - FOCUSED PHYSICAL EXAM:  Flowsheet Row Most Recent Value  Orbital Region Moderate depletion  Upper Arm Region Moderate  depletion  Thoracic and Lumbar Region Moderate depletion  Buccal Region Moderate depletion  Temple Region Severe depletion  Clavicle Bone Region Severe depletion  Clavicle and Acromion Bone Region Severe depletion  Scapular Bone Region Severe depletion  Dorsal Hand Severe depletion  Patellar Region Severe depletion  Anterior Thigh Region Severe depletion  Posterior Calf Region Severe depletion  Edema (RD Assessment) Mild  Hair Reviewed  Eyes Reviewed  Mouth Reviewed  Skin Reviewed  Nails Reviewed       Diet Order:   Diet Order             Diet regular Room service appropriate? Yes; Fluid consistency: Thin  Diet effective now                   EDUCATION NEEDS:   Not appropriate for education at this time  Skin:  Skin Assessment: Reviewed RN Assessment  Last BM:  03/17/23  Height:   Ht Readings from Last 1 Encounters:  03/16/23 5\' 8"  (1.727 m)    Weight:   Wt Readings from Last 1 Encounters:  03/16/23 72.5 kg    Ideal Body Weight:     BMI:  Body mass index is 24.3 kg/m.  Estimated Nutritional Needs:   Kcal:  2200- 2500 kcal  Protein:  95-115 g  Fluid:  44ml/kcal    Jamelle Haring RDN, LDN Clinical Dietitian   If unable to reach, please contact "RD Inpatient" secure chat group between 8 am-4 pm daily"

## 2023-03-19 ENCOUNTER — Other Ambulatory Visit (HOSPITAL_COMMUNITY): Payer: Self-pay

## 2023-03-19 DIAGNOSIS — K7460 Unspecified cirrhosis of liver: Secondary | ICD-10-CM | POA: Diagnosis not present

## 2023-03-19 DIAGNOSIS — G9340 Encephalopathy, unspecified: Secondary | ICD-10-CM | POA: Diagnosis not present

## 2023-03-19 DIAGNOSIS — J8410 Pulmonary fibrosis, unspecified: Secondary | ICD-10-CM | POA: Diagnosis not present

## 2023-03-19 DIAGNOSIS — J9611 Chronic respiratory failure with hypoxia: Secondary | ICD-10-CM | POA: Diagnosis not present

## 2023-03-19 LAB — COMPREHENSIVE METABOLIC PANEL
ALT: 115 U/L — ABNORMAL HIGH (ref 0–44)
AST: 128 U/L — ABNORMAL HIGH (ref 15–41)
Albumin: <1.5 g/dL — ABNORMAL LOW (ref 3.5–5.0)
Alkaline Phosphatase: 543 U/L — ABNORMAL HIGH (ref 38–126)
Anion gap: 7 (ref 5–15)
BUN: 23 mg/dL — ABNORMAL HIGH (ref 6–20)
CO2: 28 mmol/L (ref 22–32)
Calcium: 7.4 mg/dL — ABNORMAL LOW (ref 8.9–10.3)
Chloride: 98 mmol/L (ref 98–111)
Creatinine, Ser: 0.76 mg/dL (ref 0.61–1.24)
GFR, Estimated: >60 mL/min (ref >60)
Glucose, Bld: 179 mg/dL — ABNORMAL HIGH (ref 70–99)
Potassium: 3.9 mmol/L (ref 3.5–5.1)
Sodium: 133 mmol/L — ABNORMAL LOW (ref 135–145)
Total Bilirubin: 2 mg/dL — ABNORMAL HIGH (ref 0.0–1.2)
Total Protein: 6.8 g/dL (ref 6.5–8.1)

## 2023-03-19 LAB — CBC
HCT: 26.7 % — ABNORMAL LOW (ref 39.0–52.0)
Hemoglobin: 8.7 g/dL — ABNORMAL LOW (ref 13.0–17.0)
MCH: 36.9 pg — ABNORMAL HIGH (ref 26.0–34.0)
MCHC: 32.6 g/dL (ref 30.0–36.0)
MCV: 113.1 fL — ABNORMAL HIGH (ref 80.0–100.0)
Platelets: 77 10*3/uL — ABNORMAL LOW (ref 150–400)
RBC: 2.36 MIL/uL — ABNORMAL LOW (ref 4.22–5.81)
RDW: 19 % — ABNORMAL HIGH (ref 11.5–15.5)
WBC: 10 10*3/uL (ref 4.0–10.5)
nRBC: 0 % (ref 0.0–0.2)

## 2023-03-19 MED ORDER — PREDNISONE 20 MG PO TABS: ORAL_TABLET | ORAL | Status: DC

## 2023-03-19 MED ORDER — NOVOLOG FLEXPEN 100 UNIT/ML ~~LOC~~ SOPN
PEN_INJECTOR | SUBCUTANEOUS | 0 refills | Status: DC
  Filled 2023-03-19: qty 15, 30d supply, fill #0

## 2023-03-19 MED ORDER — LACTULOSE 10 GM/15ML PO SOLN
20.0000 g | Freq: Two times a day (BID) | ORAL | 2 refills | Status: DC
  Filled 2023-03-19: qty 1892, 32d supply, fill #0

## 2023-03-19 NOTE — TOC Transition Note (Signed)
 Transition of Care [facility]) - Discharge Note   Patient Details  Name: Richard Davenport MRN: [id number] Date of Birth: 1960-06-16  Transition of Care [facility]) CM/SW Contact:  Gordy Clement, RN Phone Number: 03/19/2023, 9:56 AM   Clinical Narrative:     Patient will DC to home today. A rolling walker will be delivered bedside by [employer]. Outpatient PT has been referred to [facility] location. Family to tyransport  No additional TOC needs           Patient Goals and CMS Choice            Discharge Placement                       Discharge Plan and Services Additional resources added to the After Visit Summary for                                       Social Drivers of Health (SDOH) Interventions SDOH Screenings   Food Insecurity: No Food Insecurity (03/17/2023)  Housing: Low Risk  (03/17/2023)  Transportation Needs: No Transportation Needs (03/17/2023)  Utilities: Not At Risk (03/17/2023)  Depression (PHQ2-9): Low Risk  (01/01/2023)  Financial Resource Strain: Low Risk  (05/24/2022)   Received from [facility], [facility]  Physical Activity: Not on File (05/10/2021)   Received from [place], [place]  Social Connections: Not on File (10/05/2022)   Received from [facility]  Stress: Not on File (05/10/2021)   Received from [facility] [facility], [place]  Tobacco Use: Medium Risk (03/17/2023)     Readmission Risk Interventions     No data to display

## 2023-03-19 NOTE — Discharge Summary (Signed)
 PATIENT DETAILS Name: Richard Davenport Age: 63 y.o. Sex: male Date of Birth: 25-Apr-1960 MRN: [id number]. Admitting Physician: Briscoe Deutscher, MD WUJ:[id number], Richard Muskrat, MD  Admit Date: 03/16/2023 Discharge date: 03/19/2023  Recommendations for Outpatient Follow-up:  Follow up with PCP in 1-2 weeks Please obtain CMP/CBC in one week  Admitted From:  Home  Disposition: Home health   Discharge Condition: fair  CODE STATUS:   Code Status: Limited: Do not attempt resuscitation (DNR) -DNR-LIMITED -Do Not Intubate/DNI    Diet recommendation:  Diet Order             Diet - low sodium heart healthy           Diet regular Room service appropriate? Yes; Fluid consistency: Thin  Diet effective now                    Brief Summary: 63 year old Pakistani gentleman-DM-2, HTN, advanced pulmonary fibrosis with chronic hypoxic respiratory failure on 5-6 L of oxygen, liver cirrhosis, prior history of CVA who presented with confusion-likely due to hepatic encephalopathy.  Significant studies 2/23>> MRI brain: No acute intracranial abnormality 2/25>> RUQ ultrasound: Nodular hepatic parenchyma with ascites.  No shadowing stones.  None dilated gallbladder.  Brief Hospital Course: Acute hepatic encephalopathy Likely cause of his confusion-improved with initiation of lactulose Discussed with daughter at bedside-he is back to his baseline-stable for discharge-continue lactulose.  Advanced pulmonary fibrosis with chronic hypoxic respiratory failure on 5-6 L of oxygen at home Continue prednisone taper Given elevated LFTs-discussed with Dr. Marchelle Davenport on 2/26-over the phone-hold pirfenidone until he is evaluated by pulmonology.  Transaminitis Chronic issue with slight worsening of AST/ALT than baseline.  Alk phos is chronically elevated.  No RUQ pain.  RUQ ultrasound unrevealing.  Liver cirrhosis Apart from encephalopathy-compensated Continue diuretics Previously followed by  hepatology at [facility] a transplant candidate.  Normocytic anemia/thrombocytopenia At baseline  DM-2 with uncontrolled hyperglycemia from steroids Resume metformin SSI while patient is on steroids.  GERD PPI  Debility/deconditioning PT/OT- home health recommended.  Palliative care DNR in place Daughter aware of poor overall prognosis-if he were to have significant decompensation in the future-will likely require transitioning to comfort measures.  Nutrition Status: Nutrition Problem: Severe Malnutrition Etiology: chronic illness Signs/Symptoms: moderate fat depletion, severe muscle depletion Interventions: Ensure Enlive (each supplement provides 350kcal and 20 grams of protein), MVI    Discharge Diagnoses:  Principal Problem:   Acute encephalopathy Active Problems:   Chronic respiratory failure with hypoxia (HCC) - 3L/min   Pulmonary fibrosis (HCC)   Thrombocytopenia (HCC)   Cirrhosis (HCC)   Protein-calorie malnutrition, severe   Altered mental status   Discharge Instructions:  Activity:  As tolerated with Full fall precautions use walker/cane & assistance as needed  Discharge Instructions     Call MD for:  difficulty breathing, headache or visual disturbances   Complete by: As directed    Call MD for:  persistant dizziness or light-headedness   Complete by: As directed    Diet - low sodium heart healthy   Complete by: As directed    Increase activity slowly   Complete by: As directed       Allergies as of 03/19/2023       Reactions   Pork-derived Products Other (See Comments)   Patient is Muslim and PREFERS TO NOT TAKE ANY PORK OR MEAT PRODUCTS (only fish)        Medication List     PAUSE taking these medications    Pirfenidone  267 MG Tabs Wait to take this until your doctor or other care provider tells you to start again. Take 2 tablets (534 mg total) by mouth with breakfast, with lunch, and with evening meal.   riTUXimab 500 MG/50ML  injection Wait to take this until your doctor or other care provider tells you to start again. Commonly known as: RITUXAN Inject 1,000 mg into the vein every 6 (six) months.       STOP taking these medications    gabapentin 300 MG capsule Commonly known as: NEURONTIN       TAKE these medications    albuterol (2.5 MG/3ML) 0.083% nebulizer solution Commonly known as: PROVENTIL Take 3 mLs (2.5 mg total) by nebulization every 4 (four) hours as needed for wheezing or shortness of breath.   albuterol 108 (90 Base) MCG/ACT inhaler Commonly known as: VENTOLIN HFA Inhale 2 puffs into the lungs every 6 (six) hours as needed for wheezing or shortness of breath.   budesonide 0.5 MG/2ML nebulizer solution Commonly known as: Pulmicort Take 2 mLs (0.5 mg total) by nebulization 2 (two) times daily.   furosemide 40 MG tablet Commonly known as: LASIX Take 1 tablet (40 mg total) by mouth daily.   guaiFENesin-dextromethorphan 100-10 MG/5ML syrup Commonly known as: ROBITUSSIN DM Take 15 mLs by mouth every 8 (eight) hours as needed for cough.   ibuprofen 200 MG tablet Commonly known as: ADVIL Take 400 mg by mouth every 6 (six) hours as needed for headache or mild pain (pain score 1-3).   lactulose 10 GM/15ML solution Commonly known as: CHRONULAC Take 30 mLs (20 g total) by mouth 2 (two) times daily. Titrate for 2-3 BMs daily.   metFORMIN 500 MG tablet Commonly known as: GLUCOPHAGE Take 1 tablet (500 mg total) by mouth 2 (two) times daily. What changed: when to take this   multivitamin with minerals Tabs tablet Take 1 tablet by mouth daily.   NovoLOG FlexPen 100 UNIT/ML FlexPen Generic drug: insulin aspart 0-9 Units, Subcutaneous, 3 times daily with meals CBG < 70: Implement Hypoglycemia measures CBG 70 - 120: 0 units CBG 121 - 150: 1 unit CBG 151 - 200: 2 units CBG 201 - 250: 3 units CBG 251 - 300: 5 units CBG 301 - 350: 7 units CBG 351 - 400: 9 units CBG > 400: call MD    omeprazole 20 MG capsule Commonly known as: PRILOSEC Take 20 mg by mouth daily as needed (for heartburn).   polyethylene glycol 17 g packet Commonly known as: MIRALAX / GLYCOLAX Take 17 g by mouth daily as needed for mild constipation.   predniSONE 20 MG tablet Commonly known as: DELTASONE Take 40 mg p.o. daily x 6 days, then 20 mg p.o. daily x 7 days. What changed: See the new instructions.   protein supplement shake Liqd Commonly known as: PREMIER PROTEIN Take 11 oz by mouth 2 (two) times daily between meals.   spironolactone 50 MG tablet Commonly known as: ALDACTONE Take 50 mg by mouth daily.        Follow-up Information     Kalman Shan, MD Follow up.   Specialty: Pulmonary Disease Why: Keep existing appointment on 3/21. Contact information: [address] [place] [address] [phone]                Allergies  Allergen Reactions   Pork-Derived Products Other (See Comments)    Patient is Muslim and PREFERS TO NOT TAKE ANY PORK OR MEAT PRODUCTS (only  fish)     Other Procedures/Studies: US ABDOMEN LIMITED RUQ (LIVER/GB) Result Date: 03/18/2023 CLINICAL DATA:  Elevated liver function tests EXAM: ULTRASOUND ABDOMEN LIMITED RIGHT UPPER QUADRANT COMPARISON:  None Available. FINDINGS: Gallbladder: Distended gallbladder. There is significant wall thickening but nonspecific in the presence of ascites. No shadowing stones. No reported sonographic Murphy's sign. Common bile duct: Diameter: Obscured by overlapping bowel gas and soft tissue as per the sonographer. Liver: Echogenic and nodular hepatic parenchyma. Portal vein is patent on color Doppler imaging with normal direction of blood flow towards the liver. Other: Diffuse ascites. IMPRESSION: Echogenic and nodular hepatic parenchyma with ascites. Wall thickening of the nondilated gallbladder. Nonspecific in the presence of ascites and chronic liver disease. No shadowing stones. Poor visualization  of the common duct Electronically Signed   By: Karen Kays M.D.   On: 03/18/2023 14:07   MR Brain Wo Contrast (neuro protocol) Result Date: 03/16/2023 CLINICAL DATA:  Acute neurologic deficit EXAM: MRI HEAD WITHOUT CONTRAST TECHNIQUE: Multiplanar, multiecho pulse sequences of the brain and surrounding structures were obtained without intravenous contrast. COMPARISON:  04/08/2018 FINDINGS: Brain: No acute infarct, mass effect or extra-axial collection. No acute or chronic hemorrhage. There is multifocal hyperintense T2-weighted signal within the white matter. Parenchymal volume and CSF spaces are normal. The midline structures are normal. Vascular: Normal flow voids. Skull and upper cervical spine: Normal calvarium and skull base. Visualized upper cervical spine and soft tissues are normal. Sinuses/Orbits:Left-greater-than-right mastoid effusions. Ocular lens replacements. Mild ethmoid sinus mucosal thickening. IMPRESSION: 1. No acute intracranial abnormality. 2. Findings of chronic small vessel ischemia. Electronically Signed   By: Deatra Robinson M.D.   On: 03/16/2023 21:23   CT Head Wo Contrast Result Date: 03/16/2023 CLINICAL DATA:  Altered level of consciousness EXAM: CT HEAD WITHOUT CONTRAST TECHNIQUE: Contiguous axial images were obtained from the base of the skull through the vertex without intravenous contrast. RADIATION DOSE REDUCTION: This exam was performed according to the departmental dose-optimization program which includes automated exposure control, adjustment of the mA and/or kV according to patient size and/or use of iterative reconstruction technique. COMPARISON:  05/19/2022 FINDINGS: Brain: New 1.0 cm hypodensity in the subcortical white matter right frontal lobe reference image 21/6, compatible with age indeterminate ischemic change. No other signs of acute infarct or hemorrhage. Lateral ventricles and midline structures are otherwise unremarkable. No acute extra-axial fluid collections. No  mass effect. Vascular: Stable stent and coil in the distribution of the basilar artery. No hyperdense vessel. Skull: Normal. Negative for fracture or focal lesion. Sinuses/Orbits: Mucosal thickening throughout the frontal, ethmoid, and sphenoid sinuses. Bilateral mastoid effusions, left greater than right. Other: None. IMPRESSION: 1. New hypodensity in the right frontal subcortical white matter, measuring up to 1 cm, compatible with age indeterminate ischemic change. 2. No evidence of acute hemorrhage. 3. Stable stent and coil in the basilar artery. 4. Paranasal sinus disease as above, with bilateral mastoid effusions left greater than right. Electronically Signed   By: Sharlet Salina M.D.   On: 03/16/2023 17:12   DG Chest Port 1 View Result Date: 03/16/2023 CLINICAL DATA:  Shortness of breath EXAM: PORTABLE CHEST 1 VIEW COMPARISON:  Chest radiograph and CTA chest dated 03/05/2023 FINDINGS: Further decrease in low lung volumes with bronchovascular crowding. Increased asymmetric left lung interstitial and dense opacity. No pleural effusion or pneumothorax. Similar cardiomediastinal silhouette. No acute osseous abnormality. Implanted loop recorder is again seen, now projecting over the midline lower mediastinum. IMPRESSION: Further decrease in low lung volumes with bronchovascular crowding.  Increased asymmetric left lung interstitial and dense opacity, which may represent a combination of pulmonary fibrosis, atelectasis, aspiration, or pneumonia. Electronically Signed   By: Agustin Cree M.D.   On: 03/16/2023 14:58   CT Angio Chest Pulmonary Embolism (PE) W or WO Contrast Result Date: 03/05/2023 CLINICAL DATA:  Pulmonary embolism (PE) suspected, high prob. Hypoxia. History of fibrosis. EXAM: CT ANGIOGRAPHY CHEST WITH CONTRAST TECHNIQUE: Multidetector CT imaging of the chest was performed using the standard protocol during bolus administration of intravenous contrast. Multiplanar CT image reconstructions and MIPs  were obtained to evaluate the vascular anatomy. RADIATION DOSE REDUCTION: This exam was performed according to the departmental dose-optimization program which includes automated exposure control, adjustment of the mA and/or kV according to patient size and/or use of iterative reconstruction technique. CONTRAST:  75mL OMNIPAQUE IOHEXOL 350 MG/ML SOLN COMPARISON:  02/11/2023 FINDINGS: Cardiovascular: No filling defects in the pulmonary arteries to suggest pulmonary emboli. Heart is mildly enlarged. Aorta normal caliber. Mediastinum/Nodes: No mediastinal, hilar, or axillary adenopathy. Trachea and esophagus are unremarkable. Thyroid unremarkable. Lungs/Pleura: Extensive bilateral changes of fibrosis with interstitial thickening and ground-glass opacities. Findings are stable since prior study. More confluent airspace opacities noted in the lower lobes, also similar to prior study. This also could be related to fibrosis although it is difficult to completely exclude superimposed edema or infection. No effusions. Upper Abdomen: Cirrhosis. Large volume ascites in the upper abdomen, similar to prior study. Musculoskeletal: Chest wall soft tissues are unremarkable. No acute bony abnormality. Review of the MIP images confirms the above findings. IMPRESSION: 1. No evidence of pulmonary embolus. 2. Extensive changes of fibrosis. More confluent opacities noted in the lower lobes, similar prior study which may also reflect chronic fibrotic changes, but difficult to completely exclude superimposed edema or infection. 3. Cardiomegaly. 4. Cirrhosis with upper abdominal ascites, stable. Electronically Signed   By: Charlett Nose M.D.   On: 03/05/2023 18:48   DG Chest Port 1 View Result Date: 03/05/2023 CLINICAL DATA:  Shortness of breath.  History of pulmonary fibrosis. EXAM: PORTABLE CHEST 1 VIEW COMPARISON:  Chest radiograph dated 02/15/2023. FINDINGS: Low lung volumes. Stable enlargement of the cardiac silhouette. Diffuse  bilateral interstitial changes are again noted compatible with history of pulmonary fibrosis. There is asymmetric increased interstitial density within the left lung. Similar suspected small right pleural effusion. No acute osseous abnormality. IMPRESSION: 1. Low lung volumes. Diffuse bilateral interstitial changes compatible with history of pulmonary fibrosis. Increased interstitial opacification within the left lung is concerning for superimposed asymmetric edema or an infectious/inflammatory etiology. 2. Similar suspected small right pleural effusion. Electronically Signed   By: Hart Robinsons M.D.   On: 03/05/2023 14:08     TODAY-DAY OF DISCHARGE:  Subjective:   Richard Davenport today has no headache,no chest abdominal pain,no new weakness tingling or numbness, feels much better wants to go home today.   Objective:   Blood pressure 105/76, pulse 100, temperature 98.8 F (37.1 C), temperature source Oral, resp. rate (!) 22, height 5\' 8"  (1.727 m), weight 72.5 kg, SpO2 91%.  Intake/Output Summary (Last 24 hours) at 03/19/2023 0950 Last data filed at 03/19/2023 0522 Gross per 24 hour  Intake 213.33 ml  Output 1675 ml  Net -1461.67 ml   Filed Weights   03/16/23 1253  Weight: 72.5 kg    Exam: Awake Alert, Oriented *3, No new F.N deficits, Normal affect [place].AT,PERRAL Supple Neck,No JVD, No cervical lymphadenopathy appriciated.  Symmetrical Chest wall movement, Good air movement bilaterally, CTAB RRR,No Gallops,Rubs or  new Murmurs, No Parasternal Heave +ve B.Sounds, Abd Soft, Non tender, No organomegaly appriciated, No rebound -guarding or rigidity. No Cyanosis, Clubbing or edema, No new Rash or bruise   PERTINENT RADIOLOGIC STUDIES: US ABDOMEN LIMITED RUQ (LIVER/GB) Result Date: 03/18/2023 CLINICAL DATA:  Elevated liver function tests EXAM: ULTRASOUND ABDOMEN LIMITED RIGHT UPPER QUADRANT COMPARISON:  None Available. FINDINGS: Gallbladder: Distended gallbladder. There is significant wall  thickening but nonspecific in the presence of ascites. No shadowing stones. No reported sonographic Murphy's sign. Common bile duct: Diameter: Obscured by overlapping bowel gas and soft tissue as per the sonographer. Liver: Echogenic and nodular hepatic parenchyma. Portal vein is patent on color Doppler imaging with normal direction of blood flow towards the liver. Other: Diffuse ascites. IMPRESSION: Echogenic and nodular hepatic parenchyma with ascites. Wall thickening of the nondilated gallbladder. Nonspecific in the presence of ascites and chronic liver disease. No shadowing stones. Poor visualization of the common duct Electronically Signed   By: Karen Kays M.D.   On: 03/18/2023 14:07     PERTINENT LAB RESULTS: CBC: Recent Labs    03/18/23 0948 03/19/23 0451  WBC 9.4 10.0  HGB 8.7* 8.7*  HCT 27.1* 26.7*  PLT 82* 77*   CMET CMP     Component Value Date/Time   NA 133 (L) 03/19/2023 0451   NA 140 01/04/2022 1159   K 3.9 03/19/2023 0451   CL 98 03/19/2023 0451   CO2 28 03/19/2023 0451   GLUCOSE 179 (H) 03/19/2023 0451   BUN 23 (H) 03/19/2023 0451   BUN 18 01/04/2022 1159   CREATININE 0.76 03/19/2023 0451   CALCIUM 7.4 (L) 03/19/2023 0451   PROT 6.8 03/19/2023 0451   PROT 6.9 01/07/2023 1029   ALBUMIN <1.5 (L) 03/19/2023 0451   ALBUMIN 2.1 (L) 12/17/2022 1446   AST 128 (H) 03/19/2023 0451   ALT 115 (H) 03/19/2023 0451   ALKPHOS 543 (H) 03/19/2023 0451   BILITOT 2.0 (H) 03/19/2023 0451   BILITOT 0.8 12/17/2022 1446   GFR 98.66 11/05/2022 1434   EGFR 73 01/04/2022 1159   GFRNONAA >60 03/19/2023 0451    GFR Estimated Creatinine Clearance: 95 mL/min (by C-G formula based on SCr of 0.76 mg/dL). No results for input(s): "LIPASE", "AMYLASE" in the last 72 hours. No results for input(s): "CKTOTAL", "CKMB", "CKMBINDEX", "TROPONINI" in the last 72 hours. Invalid input(s): "POCBNP" No results for input(s): "DDIMER" in the last 72 hours. No results for input(s): "HGBA1C" in the  last 72 hours. No results for input(s): "CHOL", "HDL", "LDLCALC", "TRIG", "CHOLHDL", "LDLDIRECT" in the last 72 hours. Recent Labs    03/17/23 0541  TSH 6.647*   Recent Labs    03/17/23 0541  VITAMINB12 1,595*   Coags: Recent Labs    03/17/23 0541 03/18/23 0636  INR 1.8* 1.5*   Microbiology: Recent Results (from the past 240 hours)  Resp panel by RT-PCR (RSV, Flu A&B, Covid) Anterior Nasal Swab     Status: None   Collection Time: 03/16/23  2:03 PM   Specimen: Anterior Nasal Swab  Result Value Ref Range Status   SARS Coronavirus 2 by RT PCR NEGATIVE NEGATIVE Final   Influenza A by PCR NEGATIVE NEGATIVE Final   Influenza B by PCR NEGATIVE NEGATIVE Final    Comment: (NOTE) The Xpert Xpress SARS-CoV-2/FLU/RSV plus assay is intended as an aid in the diagnosis of influenza from Nasopharyngeal swab specimens and should not be used as a sole basis for treatment. Nasal washings and aspirates are unacceptable for Xpert  Xpress SARS-CoV-2/FLU/RSV testing.  Fact Sheet for Patients: BloggerCourse.com  Fact Sheet for Healthcare Providers: SeriousBroker.it  This test is not yet approved or cleared by the [place] FDA and has been authorized for detection and/or diagnosis of SARS-CoV-2 by FDA under an Emergency Use Authorization (EUA). This EUA will remain in effect (meaning this test can be used) for the duration of the COVID-19 declaration under Section 564(b)(1) of the Act, 21 U.S.C. section 360bbb-3(b)(1), unless the authorization is terminated or revoked.     Resp Syncytial Virus by PCR NEGATIVE NEGATIVE Final    Comment: (NOTE) Fact Sheet for Patients: BloggerCourse.com  Fact Sheet for Healthcare Providers: SeriousBroker.it  This test is not yet approved or cleared by the [place] FDA and has been authorized for detection and/or diagnosis of SARS-CoV-2 by FDA  under an Emergency Use Authorization (EUA). This EUA will remain in effect (meaning this test can be used) for the duration of the COVID-19 declaration under Section 564(b)(1) of the Act, 21 U.S.C. section 360bbb-3(b)(1), unless the authorization is terminated or revoked.  Performed at [facility], [address], [place], [place] [address]     FURTHER DISCHARGE INSTRUCTIONS:  Get Medicines reviewed and adjusted: Please take all your medications with you for your next visit with your Primary MD  Laboratory/radiological data: Please request your Primary MD to go over all hospital tests and procedure/radiological results at the follow up, please ask your Primary MD to get all Hospital records sent to his/her office.  In some cases, they will be blood work, cultures and biopsy results pending at the time of your discharge. Please request that your primary care M.D. goes through all the records of your hospital data and follows up on these results.  Also Note the following: If you experience worsening of your admission symptoms, develop shortness of breath, life threatening emergency, suicidal or homicidal thoughts you must seek medical attention immediately by calling 911 or calling your MD immediately  if symptoms less severe.  You must read complete instructions/literature along with all the possible adverse reactions/side effects for all the Medicines you take and that have been prescribed to you. Take any new Medicines after you have completely understood and accpet all the possible adverse reactions/side effects.   Do not drive when taking Pain medications or sleeping medications (Benzodaizepines)  Do not take more than prescribed Pain, Sleep and Anxiety Medications. It is not advisable to combine anxiety,sleep and pain medications without talking with your primary care practitioner  Special Instructions: If you have smoked or chewed Tobacco  in the last 2 yrs please stop  smoking, stop any regular Alcohol  and or any Recreational drug use.  Wear Seat belts while driving.  Please note: You were cared for by a hospitalist during your hospital stay. Once you are discharged, your primary care physician will handle any further medical issues. Please note that NO REFILLS for any discharge medications will be authorized once you are discharged, as it is imperative that you return to your primary care physician (or establish a relationship with a primary care physician if you do not have one) for your post hospital discharge needs so that they can reassess your need for medications and monitor your lab values.  Total Time spent coordinating discharge including counseling, education and face to face time equals greater than 30 minutes.  SignedJeoffrey Massed 03/19/2023 9:50 AM

## 2023-03-19 NOTE — Telephone Encounter (Signed)
 He had an other admission and is getting discharged today.  This 1 is for hepatic encephalopathy.  His liver function test was up and I told the hospitalist to stop his Esbriet altogether.  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  Can you put him on a video visit next week please

## 2023-03-19 NOTE — Plan of Care (Signed)
 Patient ID: Richard Davenport, male   DOB: 06-Dec-1960, 63 y.o.   MRN: [id number]  Problem: Education: Goal: Knowledge of General Education information will improve Description: Including pain rating scale, medication(s)/side effects and non-pharmacologic comfort measures Outcome: Adequate for Discharge   Problem: Health Behavior/Discharge Planning: Goal: Ability to manage health-related needs will improve Outcome: Adequate for Discharge   Problem: Clinical Measurements: Goal: Ability to maintain clinical measurements within normal limits will improve Outcome: Adequate for Discharge Goal: Will remain free from infection Outcome: Adequate for Discharge Goal: Diagnostic test results will improve Outcome: Adequate for Discharge Goal: Respiratory complications will improve Outcome: Adequate for Discharge Goal: Cardiovascular complication will be avoided Outcome: Adequate for Discharge   Problem: Activity: Goal: Risk for activity intolerance will decrease Outcome: Adequate for Discharge   Problem: Nutrition: Goal: Adequate nutrition will be maintained Outcome: Adequate for Discharge   Problem: Coping: Goal: Level of anxiety will decrease Outcome: Adequate for Discharge   Problem: Elimination: Goal: Will not experience complications related to bowel motility Outcome: Adequate for Discharge Goal: Will not experience complications related to urinary retention Outcome: Adequate for Discharge   Problem: Pain Managment: Goal: General experience of comfort will improve and/or be controlled Outcome: Adequate for Discharge   Problem: Safety: Goal: Ability to remain free from injury will improve Outcome: Adequate for Discharge   Problem: Skin Integrity: Goal: Risk for impaired skin integrity will decrease Outcome: Adequate for Discharge   Problem: Increased Nutrient Needs (NI-5.1) Goal: Food and/or nutrient delivery Description: Individualized approach for food/nutrient  provision. Outcome: Adequate for Discharge   Problem: Acute Rehab PT Goals(only PT should resolve) Goal: Pt Will Go Supine/Side To Sit Outcome: Adequate for Discharge Goal: Patient Will Transfer Sit To/From Stand Outcome: Adequate for Discharge Goal: Pt Will Transfer Bed To Chair/Chair To Bed Outcome: Adequate for Discharge Goal: Pt Will Ambulate Outcome: Adequate for Discharge   Problem: Education: Goal: Ability to describe self-care measures that may prevent or decrease complications (Diabetes Survival Skills Education) will improve Outcome: Adequate for Discharge Goal: Individualized Educational Video(s) Outcome: Adequate for Discharge   Problem: Coping: Goal: Ability to adjust to condition or change in health will improve Outcome: Adequate for Discharge   Problem: Fluid Volume: Goal: Ability to maintain a balanced intake and output will improve Outcome: Adequate for Discharge   Problem: Health Behavior/Discharge Planning: Goal: Ability to identify and utilize available resources and services will improve Outcome: Adequate for Discharge Goal: Ability to manage health-related needs will improve Outcome: Adequate for Discharge   Problem: Metabolic: Goal: Ability to maintain appropriate glucose levels will improve Outcome: Adequate for Discharge   Problem: Nutritional: Goal: Maintenance of adequate nutrition will improve Outcome: Adequate for Discharge Goal: Progress toward achieving an optimal weight will improve Outcome: Adequate for Discharge   Problem: Skin Integrity: Goal: Risk for impaired skin integrity will decrease Outcome: Adequate for Discharge   Problem: Tissue Perfusion: Goal: Adequacy of tissue perfusion will improve Outcome: Adequate for Discharge    Lidia Collum, RN

## 2023-03-20 ENCOUNTER — Telehealth: Payer: Self-pay | Admitting: Internal Medicine

## 2023-03-20 NOTE — Telephone Encounter (Signed)
 [name] physical therapist states patient mobility has declined. Patient is almost bed bown. [name] phone number is [phone].

## 2023-03-21 NOTE — Telephone Encounter (Signed)
 Can you put him on a video visit next week please per  Dr. Marchelle Gearing

## 2023-03-21 NOTE — Telephone Encounter (Signed)
 [facility] Order faxed back confirmation received

## 2023-03-24 NOTE — Telephone Encounter (Signed)
 Patient is scheduled for mychart visit 03/27/2023 at 4:30pm. Daughter is aware.

## 2023-03-27 ENCOUNTER — Telehealth: Admitting: Internal Medicine

## 2023-04-01 ENCOUNTER — Ambulatory Visit: Payer: Medicaid Other | Admitting: Allergy and Immunology

## 2023-04-11 ENCOUNTER — Encounter: Payer: Medicaid Other | Admitting: Internal Medicine

## 2023-04-11 NOTE — Progress Notes (Signed)
 This encounter was created in error - please disregard.

## 2023-04-11 NOTE — Patient Instructions (Signed)
 ICD-10-CM   1. Acute on chronic respiratory failure with hypoxia (HCC)  J96.21     2. Idiopathic pulmonary fibrosis (HCC)  J84.112     3. Hospital discharge follow-up  Z09     4. Elevated IgE level  R76.8       Acute on chronic respiratory failure with hypoxia (HCC) Idiopathic pulmonary fibrosis [facility]) Hospital discharge follow-up  -Currently worst pulse ox 45% on 10 L oxygen and corrected on 90% on 15 L oxygen.  Class IV dyspnea.  I am concerned that you are having progressive worsening in pulmonary fibrosis and a life expectancy is diminished.  Plan  - Call emergency services to take you to the emergency department.  Elevated IgE level  -Dr. 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 Francesco Sor  Goals of care   -Your life expectancy is very diminished and you have an incurable problem.  I recommend hospitalization immediately.  In addition I recommend conversation with your providers in the hospital about your diminished life expectancy and getting to an alignment of focusing on your quality of life for the remainder of your natural life.  Plan  - Requires inpatient palliative care goals of care -Recommend hospice  Abnormal liver CT and cirrhosis with ascites and MELD score  - -Noted [employer] said you not a candidate for lung transplantation in May/June 2024  Plan  - This makes your prognosis worse   Followup -   -1 months ; 30 min visit with Dr Marchelle Gearing or APP  -Cancel this if you are under hospice care

## 2023-04-14 ENCOUNTER — Telehealth: Payer: Self-pay | Admitting: Internal Medicine

## 2023-04-14 NOTE — Telephone Encounter (Signed)
 Hospice has notified us via fax that this PT passed away on 2023-04-15.

## 2023-04-15 NOTE — Telephone Encounter (Signed)
 Please get condolenc card

## 2023-04-16 NOTE — Telephone Encounter (Signed)
 done

## 2023-07-02 ENCOUNTER — Ambulatory Visit: Payer: Medicaid Other
